# Patient Record
Sex: Male | Born: 1945 | Race: White | Hispanic: No | Marital: Married | Smoking: Never smoker
Health system: Southern US, Community
[De-identification: ages and names within clinical notes are randomized; demographics above are authoritative.]

## PROBLEM LIST (undated history)

## (undated) DIAGNOSIS — K5900 Constipation, unspecified: Secondary | ICD-10-CM

## (undated) DIAGNOSIS — N400 Enlarged prostate without lower urinary tract symptoms: Secondary | ICD-10-CM

## (undated) DIAGNOSIS — G709 Myoneural disorder, unspecified: Secondary | ICD-10-CM

## (undated) DIAGNOSIS — Z9689 Presence of other specified functional implants: Secondary | ICD-10-CM

## (undated) DIAGNOSIS — Z973 Presence of spectacles and contact lenses: Secondary | ICD-10-CM

## (undated) DIAGNOSIS — C801 Malignant (primary) neoplasm, unspecified: Secondary | ICD-10-CM

## (undated) DIAGNOSIS — D649 Anemia, unspecified: Secondary | ICD-10-CM

## (undated) DIAGNOSIS — G20 Parkinson's disease: Secondary | ICD-10-CM

## (undated) DIAGNOSIS — F329 Major depressive disorder, single episode, unspecified: Secondary | ICD-10-CM

## (undated) DIAGNOSIS — F32A Depression, unspecified: Secondary | ICD-10-CM

## (undated) DIAGNOSIS — Z974 Presence of external hearing-aid: Secondary | ICD-10-CM

## (undated) DIAGNOSIS — G4752 REM sleep behavior disorder: Secondary | ICD-10-CM

## (undated) DIAGNOSIS — Z7739 Contact with and (suspected) exposure to other war theater: Secondary | ICD-10-CM

## (undated) DIAGNOSIS — Z87442 Personal history of urinary calculi: Secondary | ICD-10-CM

## (undated) DIAGNOSIS — R5383 Other fatigue: Secondary | ICD-10-CM

## (undated) DIAGNOSIS — K219 Gastro-esophageal reflux disease without esophagitis: Secondary | ICD-10-CM

## (undated) DIAGNOSIS — I951 Orthostatic hypotension: Secondary | ICD-10-CM

## (undated) DIAGNOSIS — N486 Induration penis plastica: Secondary | ICD-10-CM

## (undated) DIAGNOSIS — R569 Unspecified convulsions: Secondary | ICD-10-CM

## (undated) DIAGNOSIS — Z77098 Contact with and (suspected) exposure to other hazardous, chiefly nonmedicinal, chemicals: Secondary | ICD-10-CM

## (undated) DIAGNOSIS — T8859XA Other complications of anesthesia, initial encounter: Secondary | ICD-10-CM

## (undated) DIAGNOSIS — N200 Calculus of kidney: Secondary | ICD-10-CM

## (undated) DIAGNOSIS — J189 Pneumonia, unspecified organism: Secondary | ICD-10-CM

## (undated) DIAGNOSIS — R399 Unspecified symptoms and signs involving the genitourinary system: Secondary | ICD-10-CM

## (undated) DIAGNOSIS — G20A1 Parkinson's disease without dyskinesia, without mention of fluctuations: Secondary | ICD-10-CM

## (undated) DIAGNOSIS — N281 Cyst of kidney, acquired: Secondary | ICD-10-CM

## (undated) DIAGNOSIS — N201 Calculus of ureter: Secondary | ICD-10-CM

## (undated) HISTORY — PX: EXTRACORPOREAL SHOCK WAVE LITHOTRIPSY: SHX1557

## (undated) HISTORY — PX: COLONOSCOPY: SHX174

## (undated) HISTORY — PX: CATARACT EXTRACTION: SUR2

## (undated) HISTORY — PX: TONSILLECTOMY: SUR1361

## (undated) HISTORY — PX: OTHER SURGICAL HISTORY: SHX169

---

## 2004-06-30 ENCOUNTER — Inpatient Hospital Stay (HOSPITAL_COMMUNITY): Admission: EM | Admit: 2004-06-30 | Discharge: 2004-07-01 | Payer: Self-pay | Admitting: Emergency Medicine

## 2004-06-30 ENCOUNTER — Ambulatory Visit: Payer: Self-pay | Admitting: Internal Medicine

## 2010-09-25 ENCOUNTER — Emergency Department (HOSPITAL_COMMUNITY): Payer: Medicare Other

## 2010-09-25 ENCOUNTER — Observation Stay (HOSPITAL_COMMUNITY)
Admission: RE | Admit: 2010-09-25 | Discharge: 2010-09-26 | Disposition: A | Discharge status: Home / Self Care | Payer: Medicare Other | Source: Ambulatory Visit | Attending: Urology | Admitting: Urology

## 2010-09-25 ENCOUNTER — Emergency Department (HOSPITAL_COMMUNITY)
Admission: EM | Admit: 2010-09-25 | Discharge: 2010-09-25 | Disposition: A | Discharge status: Other Acute Inpatient Hospital | Payer: Medicare Other | Source: Home / Self Care | Attending: Emergency Medicine | Admitting: Emergency Medicine

## 2010-09-25 DIAGNOSIS — R10814 Left lower quadrant abdominal tenderness: Secondary | ICD-10-CM | POA: Insufficient documentation

## 2010-09-25 DIAGNOSIS — G20A1 Parkinson's disease without dyskinesia, without mention of fluctuations: Secondary | ICD-10-CM | POA: Insufficient documentation

## 2010-09-25 DIAGNOSIS — R1032 Left lower quadrant pain: Secondary | ICD-10-CM | POA: Insufficient documentation

## 2010-09-25 DIAGNOSIS — R110 Nausea: Secondary | ICD-10-CM | POA: Insufficient documentation

## 2010-09-25 DIAGNOSIS — N200 Calculus of kidney: Secondary | ICD-10-CM | POA: Insufficient documentation

## 2010-09-25 DIAGNOSIS — N210 Calculus in bladder: Secondary | ICD-10-CM | POA: Insufficient documentation

## 2010-09-25 DIAGNOSIS — G20 Parkinson's disease: Secondary | ICD-10-CM | POA: Insufficient documentation

## 2010-09-25 DIAGNOSIS — N201 Calculus of ureter: Secondary | ICD-10-CM | POA: Insufficient documentation

## 2010-09-25 DIAGNOSIS — Z79899 Other long term (current) drug therapy: Secondary | ICD-10-CM | POA: Insufficient documentation

## 2010-09-25 DIAGNOSIS — N400 Enlarged prostate without lower urinary tract symptoms: Secondary | ICD-10-CM | POA: Insufficient documentation

## 2010-09-25 DIAGNOSIS — N1330 Unspecified hydronephrosis: Principal | ICD-10-CM | POA: Insufficient documentation

## 2010-09-25 DIAGNOSIS — K5900 Constipation, unspecified: Secondary | ICD-10-CM | POA: Insufficient documentation

## 2010-09-25 HISTORY — PX: OTHER SURGICAL HISTORY: SHX169

## 2010-09-25 LAB — CBC
HCT: 39.9 % (ref 39.0–52.0)
Hemoglobin: 13.4 g/dL (ref 13.0–17.0)
MCH: 30.1 pg (ref 26.0–34.0)
MCHC: 33.6 g/dL (ref 30.0–36.0)
MCV: 89.7 fL (ref 78.0–100.0)
Platelets: 291 10*3/uL (ref 150–400)
RBC: 4.45 MIL/uL (ref 4.22–5.81)
RDW: 12.1 % (ref 11.5–15.5)
WBC: 6.9 10*3/uL (ref 4.0–10.5)

## 2010-09-25 LAB — URINALYSIS, ROUTINE W REFLEX MICROSCOPIC
Bilirubin Urine: NEGATIVE
Glucose, UA: NEGATIVE mg/dL
Hgb urine dipstick: NEGATIVE
Ketones, ur: NEGATIVE mg/dL
Nitrite: NEGATIVE
Protein, ur: NEGATIVE mg/dL
Specific Gravity, Urine: 1.01 (ref 1.005–1.030)
Urobilinogen, UA: 0.2 mg/dL (ref 0.0–1.0)
pH: 7.5 (ref 5.0–8.0)

## 2010-09-25 LAB — DIFFERENTIAL
Basophils Absolute: 0 10*3/uL (ref 0.0–0.1)
Basophils Relative: 0 % (ref 0–1)
Eosinophils Absolute: 0.1 10*3/uL (ref 0.0–0.7)
Eosinophils Relative: 2 % (ref 0–5)
Lymphocytes Relative: 23 % (ref 12–46)
Lymphs Abs: 1.6 K/uL (ref 0.7–4.0)
Monocytes Absolute: 0.6 10*3/uL (ref 0.1–1.0)
Monocytes Relative: 9 % (ref 3–12)
Neutro Abs: 4.5 K/uL (ref 1.7–7.7)
Neutrophils Relative %: 65 % (ref 43–77)

## 2010-09-25 LAB — BASIC METABOLIC PANEL WITH GFR
BUN: 13 mg/dL (ref 6–23)
CO2: 27 meq/L (ref 19–32)
Calcium: 9.2 mg/dL (ref 8.4–10.5)
Chloride: 104 meq/L (ref 96–112)
GFR calc Af Amer: >60 mL/min (ref >60)
Glucose, Bld: 91 mg/dL (ref 70–99)
Potassium: 4.2 meq/L (ref 3.5–5.1)

## 2010-09-25 LAB — BASIC METABOLIC PANEL
Creatinine, Ser: 0.84 mg/dL (ref 0.50–1.35)
GFR calc non Af Amer: >60 mL/min (ref >60)
Sodium: 137 mEq/L (ref 135–145)

## 2010-09-25 LAB — URINE MICROSCOPIC-ADD ON

## 2010-09-25 MED ORDER — IOHEXOL 300 MG/ML  SOLN
80.0000 mL | Freq: Once | INTRAMUSCULAR | Status: AC | PRN
  Administered 2010-09-25: 80 mL via INTRAVENOUS

## 2010-10-03 NOTE — Op Note (Signed)
NAMEKOURTLAND, COOPMAN                ACCOUNT NO.:  [id number]  MEDICAL RECORD NO.:  [id number]  LOCATION:  1423                         FACILITY:  [facility]  PHYSICIAN:  Danae Chen, M.D.  DATE OF BIRTH:  1945-05-23  DATE OF PROCEDURE:  09/25/2010 DATE OF DISCHARGE:                              OPERATIVE REPORT   PREOPERATIVE DIAGNOSES: 1. Left hydronephrosis. 2. Left uretero-pelvic junction calculus. 3. Left ureteral stone, bladder calculi.  POSTOPERATIVE DIAGNOSES: 1. Left hydronephrosis. 2. Left uretero-pelvic junction calculus. 3. Left ureteral stone, bladder calculi.  PROCEDURE:  Cystoscopy, left retrograde pyelogram, ureteroscopy, insertion of double-J stent, holmium laser bladder stone, stone extraction.  SURGEON:  Danae Chen, M.D.  ANESTHESIA:  General.  INDICATION:  Patient is a 65 year old male, who has been having left- sided abdominal pain for the past 5 to 6 days.  The pain got progressively worse.  He is usually seen at the [place] and had an appointment today, however, the pain was severe enough for him to come to the emergency room.  A CT scan showed a 16-mm stone at the left UPJ, severe left hydronephrosis.  A 3 mm stone at the left distal ureter and a 23 mm stone in the bladder and a 5-mm stone.  He is scheduled for cystoscopy, retrograde pyelogram, insertion of double-J stent, and holmium laser of the bladder calculi.  The patient was identified by his wristband and proper time-out was taken.  Under general anesthesia, he was prepped and draped and placed him under dorsal lithotomy position.  A panendoscope was inserted in the bladder. The anterior urethra was normal.  He has trilobar prostatic hypertrophy. The bladder mucosa is reddened.  There is a large stone in the bladder that measured about 23 mm and there is also a 5 mm smaller stone in the bladder.  The ureteral orifices are in normal position and shape.  Retrograde pyelogram.  A Sensor wire was  passed through a #6-French open- ended catheter and both Sensor wire and open-ended catheter were passed through the cystoscope and through the left ureteral orifice.  The open- ended catheter was advanced in the distal ureter.  The Sensor wire was then removed.  About 10 cc of contrast were then injected through the open-ended catheter.  The distal, mid, and proximal ureter appeared normal.  There was a filling defect at the UPJ consistent with a known UPJ stone.  The renal pelvis and calyces were markedly dilated.  The Sensor wire was then passed through the open-ended catheter and advanced in the renal pelvis and the open-ended catheter was removed.  A semi-rigid ureteroscope was passed in the bladder and through the left ureteral orifice and in the distal ureter.  There was no evidence of stone in the ureter.  The ureteroscope was then removed.  The Sensor wire was back loaded into the cystoscope and a #6-French - 26 double-J stent was passed over the Sensor wire.  The Sensor wire was then removed.  The proximal curl of the double-J stent was in the renal pelvis and the distal curl was in the bladder.  Attention was then directed toward the bladder calculi.  With a 1000 micron-fiber  holmium laser, the large bladder stone was fragmented in several smaller fragments.  Some of the fragments were small enough to be irrigated out of the bladder.  The larger fragments were removed with a nitinol basket through the cystoscope.  It was a very tedious procedure because of the size of the stone and it was a difficult stone to fragment.  All stone fragments were then removed out of the bladder. There were no remaining stone fragments.  The cystoscope was then removed.  A #16-French Foley catheter was then inserted in the bladder.  The patient tolerated the procedure well and left the OR in satisfactory condition to postanesthesia care unit.     Danae Chen, M.D.     MN/MEDQ  D:   09/25/2010  T:  09/26/2010  Job:  [id number]  Electronically Signed by Lindaann Slough M.D. on 10/03/2010 11:27:22 AM

## 2010-10-03 NOTE — Discharge Summary (Signed)
  [name], [name]                ACCOUNT NO.:  [id number]  MEDICAL RECORD NO.:  [id number]  LOCATION:  1423                         FACILITY:  [facility]  PHYSICIAN:  Danae Chen, M.D.  DATE OF BIRTH:  [birth year]  DATE OF ADMISSION:  09/25/2010 DATE OF DISCHARGE:  09/26/2010                              DISCHARGE SUMMARY   DISCHARGE DIAGNOSES:  Left hydronephrosis, left uretero-pelvic junction calculus, bladder calculi.  PROCEDURE DONE:  Cystoscopy, left retrograde pyelogram, ureteroscopy, left double-J stent, holmium laser bladder calculi, and stone extraction on September 25, 2010.  The patient is a 65 year old male who was seen in the emergency room with a 5- to 6-day history of left flank and left lower quadrant pain. The pain got progressively worse and he went to the emergency room on August 23rd.  CT scan showed severe left hydronephrosis with a 16 mm stone at the left UPJ and a 3 mm stone in the left distal ureter and 2 calculi in the bladder, the largest one measures 23 mm.  The patient has a history of Parkinson disease and BPH.  He had cystoscopy, left retrograde pyelogram, ureteroscopy, left double-J stent, EHL, bladder calculi with stone extraction on September 25, 2010.  He was admitted for observation after the procedure.  His postop course was uneventful.  He remained afebrile.  A Foley catheter that was placed in the bladder was draining well.  Urine was clear.  He tolerated his diet well.  He had bowel movements.  His abdomen was soft and nondistended, nontender.  He was then discharged home on August 24th on all his home medication, Percocet, Macrodantin 100 mg twice a day.  He will return to the office next week for Foley catheter removal.  He will be treated at a later date with ESL for the left UPJ calculus.  CONDITION ON DISCHARGE:  Improved.  DISCHARGE DIET:  Regular.  The patient may resume all his prehospital activities.     Danae Chen,  M.D.     MN/MEDQ  D:  09/26/2010  T:  09/27/2010  Job:  [id number]  Electronically Signed by Lindaann Slough M.D. on 10/03/2010 11:19:55 AM

## 2010-10-03 NOTE — H&P (Signed)
NAMEROHITH, FAUTH                ACCOUNT NO.:  [id number]  MEDICAL RECORD NO.:  [id number]  LOCATION:  1423                         FACILITY:  [facility]  PHYSICIAN:  Danae Chen, M.D.  DATE OF BIRTH:  05/15/1945  DATE OF ADMISSION:  09/25/2010 DATE OF DISCHARGE:                             HISTORY & PHYSICAL   CHIEF COMPLAINT:  Left flank pain, left hydronephrosis, left UPJ stone, and bladder calculi.  Patient is 65 year old male who was seen in the emergency room with a 5- day history of left flank and left lower quadrant pain.  The pain has persisted and became more severe today.  The patient is seen at the [place] in [place].  He had an appointment today at the [place], but he came to the emergency room because of the pain.  A CT scan showed severe left hydronephrosis with a 16 mm stone at the UPJ.  He has 2 stones at the left UVJ and a 23 mm stone in the bladder.  He also has some nonobstructing right renal calculi.  I wasthen asked to see the patient for further evaluation and treatment.  PAST MEDICAL HISTORY:  He has a history of Parkinson disease and BPH. He has a history of kidney stone.  SOCIAL HISTORY:  He is married, has 3 children, does not smoke nor drink.  MEDICATIONS: 1. Alfuzosin 10 mg a day, but he ran out of the medication about a     month ago. 2. Pramipexole. 3. Carbidopa and levodopa.  ALLERGIES:  He is allergic to [facility].  FAMILY HISTORY:  His father died of lung cancer.  His mother died of a stroke.  He had one sister who had rheumatoid arthritis and died of a heart attack several years ago.  REVIEW OF SYSTEMS:  As noted in the HPI and everything else is negative.  PHYSICAL EXAMINATION:  GENERAL:  This is a well-developed 65 year old male with Parkinson disease.  He is alert and oriented to time, place, and person. VITAL SIGNS:  His blood pressure is 123/75, pulse 75, respirations 20, and temperature 97.5. SKIN:  Normal.  He does not have any skin  rash. HEENT:  His head is normal.  He has pink conjunctivae.  Ears, nose, and throat are within normal limits. NECK:  Supple.  No cervical adenopathy.  No thyromegaly. CHEST:  Symmetrical. LUNGS:  Fully expanded and clear to percussion and auscultation. HEART:  Regular rhythm. ABDOMEN:  Soft.  He has moderate left flank and left lower quadrant tenderness.  He has no CVA tenderness.  Kidneys are not palpable.  He has no hepatomegaly.  No splenomegaly.  Bladder is not distended.  He has no inguinal hernia. GENITOURINARY:  Penis and scrotal contents are within normal limits. EXTREMITIES:  Within normal limits.  I reviewed the CT scan and he has some small nonobstructing right renal calculi and he has a 16 mm stone at the left UPJ and 2 small left distal ureteral calculi and a 23 mm bladder stone.  Left kidney appears smaller than the right.  His BUN is 13, creatinine 0.84, sodium 137, potassium 4.2.  Urinalysis shows 3 to 6 wbc's.  Hemoglobin is 13.4,  hematocrit 39.9, and WBC 6.9.  IMPRESSION: 1. Left hydronephrosis. 2. Bilateral renal calculi. 3. Left UPJ calculus. 4. Left ureteral calculi. 5. Bladder calculus.  PLAN:  Cystoscopy, holmium laser of bladder stones, left ureteral stone extraction, retrograde pyelogram, and insertion of double-J stent.     Danae Chen, M.D.     MN/MEDQ  D:  09/25/2010  T:  09/26/2010  Job:  [id number]  Electronically Signed by Lindaann Slough M.D. on 10/03/2010 11:24:26 AM

## 2010-10-09 ENCOUNTER — Ambulatory Visit (HOSPITAL_COMMUNITY)
Admission: RE | Admit: 2010-10-09 | Discharge: 2010-10-09 | Disposition: A | Discharge status: Home / Self Care | Payer: Medicare Other | Source: Ambulatory Visit | Attending: Urology | Admitting: Urology

## 2010-10-09 DIAGNOSIS — G20A1 Parkinson's disease without dyskinesia, without mention of fluctuations: Secondary | ICD-10-CM | POA: Insufficient documentation

## 2010-10-09 DIAGNOSIS — N200 Calculus of kidney: Secondary | ICD-10-CM | POA: Insufficient documentation

## 2010-10-09 DIAGNOSIS — Z79899 Other long term (current) drug therapy: Secondary | ICD-10-CM | POA: Insufficient documentation

## 2010-10-09 DIAGNOSIS — N1330 Unspecified hydronephrosis: Secondary | ICD-10-CM | POA: Insufficient documentation

## 2010-10-09 DIAGNOSIS — N201 Calculus of ureter: Secondary | ICD-10-CM | POA: Insufficient documentation

## 2010-10-09 DIAGNOSIS — N400 Enlarged prostate without lower urinary tract symptoms: Secondary | ICD-10-CM | POA: Insufficient documentation

## 2010-10-09 DIAGNOSIS — G20 Parkinson's disease: Secondary | ICD-10-CM | POA: Insufficient documentation

## 2010-10-09 DIAGNOSIS — N210 Calculus in bladder: Secondary | ICD-10-CM | POA: Insufficient documentation

## 2011-01-01 ENCOUNTER — Other Ambulatory Visit: Payer: Self-pay | Admitting: Urology

## 2011-01-02 ENCOUNTER — Encounter (HOSPITAL_COMMUNITY): Payer: Self-pay | Admitting: *Deleted

## 2011-01-02 ENCOUNTER — Encounter (HOSPITAL_COMMUNITY): Payer: Self-pay

## 2011-01-02 NOTE — Progress Notes (Signed)
Instructed no Aspirin or Aspirin products 3 days prior to procedure

## 2011-01-02 NOTE — Progress Notes (Signed)
Pt reminded to take laxative 01/04/11 PM

## 2011-01-05 ENCOUNTER — Ambulatory Visit (HOSPITAL_COMMUNITY)
Admission: RE | Admit: 2011-01-05 | Discharge: 2011-01-05 | Disposition: A | Discharge status: Home / Self Care | Payer: Medicare Other | Source: Ambulatory Visit | Attending: Urology | Admitting: Urology

## 2011-01-05 ENCOUNTER — Encounter (HOSPITAL_COMMUNITY): Payer: Self-pay | Admitting: *Deleted

## 2011-01-05 ENCOUNTER — Encounter (HOSPITAL_COMMUNITY)
Admission: RE | Disposition: A | Discharge status: Home / Self Care | Payer: Self-pay | Source: Ambulatory Visit | Attending: Urology

## 2011-01-05 DIAGNOSIS — N400 Enlarged prostate without lower urinary tract symptoms: Secondary | ICD-10-CM | POA: Insufficient documentation

## 2011-01-05 DIAGNOSIS — G20A1 Parkinson's disease without dyskinesia, without mention of fluctuations: Secondary | ICD-10-CM | POA: Insufficient documentation

## 2011-01-05 DIAGNOSIS — G20 Parkinson's disease: Secondary | ICD-10-CM | POA: Insufficient documentation

## 2011-01-05 DIAGNOSIS — N201 Calculus of ureter: Secondary | ICD-10-CM | POA: Insufficient documentation

## 2011-01-05 HISTORY — DX: Pneumonia, unspecified organism: J18.9

## 2011-01-05 HISTORY — DX: Myoneural disorder, unspecified: G70.9

## 2011-01-05 SURGERY — LITHOTRIPSY, ESWL
Anesthesia: LOCAL | Laterality: Left

## 2011-01-05 MED ORDER — CIPROFLOXACIN HCL 500 MG PO TABS
500.0000 mg | ORAL_TABLET | ORAL | Status: AC
  Administered 2011-01-05: 500 mg via ORAL

## 2011-01-05 MED ORDER — CIPROFLOXACIN HCL 500 MG PO TABS
ORAL_TABLET | ORAL | Status: AC
  Administered 2011-01-05: 500 mg via ORAL
  Filled 2011-01-05: qty 1

## 2011-01-05 MED ORDER — DIAZEPAM 5 MG PO TABS
ORAL_TABLET | ORAL | Status: AC
  Administered 2011-01-05: 10 mg via ORAL
  Filled 2011-01-05: qty 2

## 2011-01-05 MED ORDER — DIPHENHYDRAMINE HCL 25 MG PO CAPS
25.0000 mg | ORAL_CAPSULE | ORAL | Status: AC
  Administered 2011-01-05: 25 mg via ORAL

## 2011-01-05 MED ORDER — DIPHENHYDRAMINE HCL 25 MG PO CAPS
ORAL_CAPSULE | ORAL | Status: AC
  Administered 2011-01-05: 25 mg via ORAL
  Filled 2011-01-05: qty 1

## 2011-01-05 MED ORDER — DEXTROSE-NACL 5-0.45 % IV SOLN
INTRAVENOUS | Status: DC
  Administered 2011-01-05: 15:00:00 via INTRAVENOUS

## 2011-01-05 MED ORDER — DIAZEPAM 5 MG PO TABS
10.0000 mg | ORAL_TABLET | ORAL | Status: AC
  Administered 2011-01-05: 10 mg via ORAL

## 2011-01-05 NOTE — Op Note (Signed)
Refer to [facility] Op Note scanned in the chart 

## 2011-01-05 NOTE — H&P (Signed)
  History and Physical  Chief Complaint: Left renal stone.  History of Present Illness: [name] [name] had ESL left UPJ calculus on 9/6.  He has a 7 mm stone in the proximal ureter.  He needs ESL.  Past Medical History  Diagnosis Date  . Pneumonia     35 yr ago  . Neuromuscular disorder     Parkinsons disease  . Chronic kidney disease     BPH   Past Surgical History  Procedure Date  . Stones removed from bladder 8/12  . Lithotripsy  x2 41yr ago 9/12    Medications:  Prior to Admission:  Prescriptions prior to admission  Medication Sig Dispense Refill  . acetaminophen (TYLENOL) 500 MG tablet Take 1,000 mg by mouth every 6 (six) hours as needed. For pain.       Marland Kitchen alfuzosin (UROXATRAL) 10 MG 24 hr tablet Take 10 mg by mouth every morning.        . carbidopa-levodopa (SINEMET) 25-100 MG per tablet Take 1 tablet by mouth 3 (three) times daily.        . potassium citrate (UROCIT-K) 10 MEQ (1080 MG) SR tablet Take 10 mEq by mouth 3 (three) times daily.        . pramipexole (MIRAPEX) 0.5 MG tablet Take 0.5 mg by mouth 3 (three) times daily.        Marland Kitchen triamcinolone cream (KENALOG) 0.1 % Apply 1 application topically daily as needed. For ezcema.        Allergies:  Allergies  Allergen Reactions  . Flomax (Tamsulosin Hcl) Other (See Comments)    Achy joints    History reviewed. No pertinent family history. Social History:  reports that he has never smoked. He does not have any smokeless tobacco history on file. He reports that he does not use illicit drugs. His alcohol history not on file.  ROS: All systems are reviewed and negative except as noted.   Physical Exam:  Vital signs in last 24 hours: Temp:  [96.5 F (35.8 C)] 96.5 F (35.8 C) (12/03 1458) Pulse Rate:  [85] 85  (12/03 1458) Resp:  [18] 18  (12/03 1458) BP: (118)/(72) 118/72 mmHg (12/03 1458) SpO2:  [96 %] 96 % (12/03 1458) Weight:  [199 lb 4 oz (90.379 kg)] 199 lb 4 oz (90.379 kg) (12/03 1458)  Cardiovascular: Skin  warm; not flushed Respiratory: Breaths quiet; no shortness of breath Abdomen: No masses Neurological: Normal sensation to touch Musculoskeletal: Normal motor function arms and legs Lymphatics: No inguinal adenopathy Skin: No rashes Genitourinary:Penis and scrotal contents are within normal limits.  Laboratory Data:  No results found for this or any previous visit (from the past 24 hour(s)). No results found for this or any previous visit (from the past 240 hour(s)). Creatinine: No results found for this basename: CREATININE:7 in the last 168 hours     Impression/Assessment:  Left proximal ureteral calculus  Plan:  ESL  Allisyn Kunz-HENRY 01/05/2011, 3:50 PM

## 2011-01-06 ENCOUNTER — Encounter (HOSPITAL_COMMUNITY): Payer: Self-pay

## 2011-02-20 DIAGNOSIS — Z8249 Family history of ischemic heart disease and other diseases of the circulatory system: Secondary | ICD-10-CM | POA: Diagnosis not present

## 2011-02-20 DIAGNOSIS — I359 Nonrheumatic aortic valve disorder, unspecified: Secondary | ICD-10-CM | POA: Diagnosis not present

## 2011-02-20 DIAGNOSIS — Z7982 Long term (current) use of aspirin: Secondary | ICD-10-CM | POA: Diagnosis not present

## 2011-02-20 DIAGNOSIS — I059 Rheumatic mitral valve disease, unspecified: Secondary | ICD-10-CM | POA: Diagnosis not present

## 2011-02-20 DIAGNOSIS — Z823 Family history of stroke: Secondary | ICD-10-CM | POA: Diagnosis not present

## 2011-02-20 DIAGNOSIS — Z888 Allergy status to other drugs, medicaments and biological substances status: Secondary | ICD-10-CM | POA: Diagnosis not present

## 2011-02-20 DIAGNOSIS — Z87442 Personal history of urinary calculi: Secondary | ICD-10-CM | POA: Diagnosis not present

## 2011-02-20 DIAGNOSIS — N400 Enlarged prostate without lower urinary tract symptoms: Secondary | ICD-10-CM | POA: Diagnosis not present

## 2011-02-20 DIAGNOSIS — Z79899 Other long term (current) drug therapy: Secondary | ICD-10-CM | POA: Diagnosis not present

## 2011-02-20 DIAGNOSIS — G20 Parkinson's disease: Secondary | ICD-10-CM | POA: Diagnosis not present

## 2011-02-20 DIAGNOSIS — R079 Chest pain, unspecified: Secondary | ICD-10-CM | POA: Diagnosis not present

## 2011-02-20 DIAGNOSIS — R0789 Other chest pain: Secondary | ICD-10-CM | POA: Diagnosis not present

## 2011-05-21 DIAGNOSIS — D1801 Hemangioma of skin and subcutaneous tissue: Secondary | ICD-10-CM | POA: Diagnosis not present

## 2011-05-21 DIAGNOSIS — D485 Neoplasm of uncertain behavior of skin: Secondary | ICD-10-CM | POA: Diagnosis not present

## 2011-05-21 DIAGNOSIS — L821 Other seborrheic keratosis: Secondary | ICD-10-CM | POA: Diagnosis not present

## 2011-06-11 DIAGNOSIS — H2510 Age-related nuclear cataract, unspecified eye: Secondary | ICD-10-CM | POA: Diagnosis not present

## 2011-10-29 DIAGNOSIS — S0100XA Unspecified open wound of scalp, initial encounter: Secondary | ICD-10-CM | POA: Diagnosis not present

## 2011-10-29 DIAGNOSIS — Z23 Encounter for immunization: Secondary | ICD-10-CM | POA: Diagnosis not present

## 2011-10-29 DIAGNOSIS — S0990XA Unspecified injury of head, initial encounter: Secondary | ICD-10-CM | POA: Diagnosis not present

## 2011-11-03 DIAGNOSIS — Z4802 Encounter for removal of sutures: Secondary | ICD-10-CM | POA: Diagnosis not present

## 2012-05-23 DIAGNOSIS — L821 Other seborrheic keratosis: Secondary | ICD-10-CM | POA: Diagnosis not present

## 2012-05-23 DIAGNOSIS — L819 Disorder of pigmentation, unspecified: Secondary | ICD-10-CM | POA: Diagnosis not present

## 2012-05-30 DIAGNOSIS — N401 Enlarged prostate with lower urinary tract symptoms: Secondary | ICD-10-CM | POA: Diagnosis not present

## 2012-05-30 DIAGNOSIS — N390 Urinary tract infection, site not specified: Secondary | ICD-10-CM | POA: Diagnosis not present

## 2012-05-30 DIAGNOSIS — Z87442 Personal history of urinary calculi: Secondary | ICD-10-CM | POA: Diagnosis not present

## 2012-06-15 DIAGNOSIS — H2510 Age-related nuclear cataract, unspecified eye: Secondary | ICD-10-CM | POA: Diagnosis not present

## 2012-06-21 ENCOUNTER — Encounter (HOSPITAL_COMMUNITY): Payer: Self-pay | Admitting: Emergency Medicine

## 2012-06-21 ENCOUNTER — Emergency Department (INDEPENDENT_AMBULATORY_CARE_PROVIDER_SITE_OTHER): Payer: Medicare Other

## 2012-06-21 ENCOUNTER — Emergency Department (INDEPENDENT_AMBULATORY_CARE_PROVIDER_SITE_OTHER)
Admission: EM | Admit: 2012-06-21 | Discharge: 2012-06-21 | Disposition: A | Discharge status: Home / Self Care | Payer: Medicare Other | Source: Home / Self Care | Attending: Family Medicine | Admitting: Family Medicine

## 2012-06-21 ENCOUNTER — Encounter (HOSPITAL_COMMUNITY): Payer: Self-pay | Admitting: *Deleted

## 2012-06-21 ENCOUNTER — Emergency Department (HOSPITAL_COMMUNITY)
Admission: EM | Admit: 2012-06-21 | Discharge: 2012-06-21 | Disposition: A | Discharge status: Home / Self Care | Payer: Medicare Other | Attending: Emergency Medicine | Admitting: Emergency Medicine

## 2012-06-21 DIAGNOSIS — Z79899 Other long term (current) drug therapy: Secondary | ICD-10-CM | POA: Diagnosis not present

## 2012-06-21 DIAGNOSIS — J029 Acute pharyngitis, unspecified: Secondary | ICD-10-CM | POA: Diagnosis not present

## 2012-06-21 DIAGNOSIS — J189 Pneumonia, unspecified organism: Secondary | ICD-10-CM

## 2012-06-21 DIAGNOSIS — R000 Tachycardia, unspecified: Secondary | ICD-10-CM | POA: Insufficient documentation

## 2012-06-21 DIAGNOSIS — R509 Fever, unspecified: Secondary | ICD-10-CM | POA: Diagnosis not present

## 2012-06-21 DIAGNOSIS — G20A1 Parkinson's disease without dyskinesia, without mention of fluctuations: Secondary | ICD-10-CM | POA: Insufficient documentation

## 2012-06-21 DIAGNOSIS — J3489 Other specified disorders of nose and nasal sinuses: Secondary | ICD-10-CM | POA: Diagnosis not present

## 2012-06-21 DIAGNOSIS — N189 Chronic kidney disease, unspecified: Secondary | ICD-10-CM | POA: Diagnosis not present

## 2012-06-21 DIAGNOSIS — J90 Pleural effusion, not elsewhere classified: Secondary | ICD-10-CM | POA: Diagnosis not present

## 2012-06-21 DIAGNOSIS — G20 Parkinson's disease: Secondary | ICD-10-CM | POA: Insufficient documentation

## 2012-06-21 DIAGNOSIS — N400 Enlarged prostate without lower urinary tract symptoms: Secondary | ICD-10-CM | POA: Diagnosis not present

## 2012-06-21 DIAGNOSIS — IMO0001 Reserved for inherently not codable concepts without codable children: Secondary | ICD-10-CM | POA: Insufficient documentation

## 2012-06-21 LAB — COMPREHENSIVE METABOLIC PANEL
CO2: 25 mEq/L (ref 19–32)
Calcium: 8.7 mg/dL (ref 8.4–10.5)
Creatinine, Ser: 0.9 mg/dL (ref 0.50–1.35)
GFR calc Af Amer: >90 mL/min (ref >90)
GFR calc non Af Amer: 85 mL/min — ABNORMAL LOW (ref >90)
Glucose, Bld: 88 mg/dL (ref 70–99)
Total Protein: 6.7 g/dL (ref 6.0–8.3)

## 2012-06-21 LAB — CBC
Hemoglobin: 14.4 g/dL (ref 13.0–17.0)
MCH: 31.2 pg (ref 26.0–34.0)
MCHC: 34.2 g/dL (ref 30.0–36.0)
MCV: 91.1 fL (ref 78.0–100.0)
RBC: 4.62 MIL/uL (ref 4.22–5.81)

## 2012-06-21 LAB — POCT URINALYSIS DIP (DEVICE)
Bilirubin Urine: NEGATIVE
Ketones, ur: NEGATIVE mg/dL
Nitrite: NEGATIVE
Protein, ur: 30 mg/dL — AB
pH: 8 (ref 5.0–8.0)

## 2012-06-21 MED ORDER — AZITHROMYCIN 250 MG PO TABS
500.0000 mg | ORAL_TABLET | Freq: Once | ORAL | Status: AC
  Administered 2012-06-21: 500 mg via ORAL
  Filled 2012-06-21: qty 2

## 2012-06-21 MED ORDER — AZITHROMYCIN 250 MG PO TABS: 250.0000 mg | ORAL_TABLET | Freq: Every day | ORAL | Status: DC

## 2012-06-21 MED ORDER — SODIUM CHLORIDE 0.9 % IV BOLUS (SEPSIS)
1000.0000 mL | Freq: Once | INTRAVENOUS | Status: AC
  Administered 2012-06-21: 1000 mL via INTRAVENOUS

## 2012-06-21 MED ORDER — DEXTROSE 5 % IV SOLN
1.0000 g | Freq: Once | INTRAVENOUS | Status: AC
  Administered 2012-06-21: 1 g via INTRAVENOUS
  Filled 2012-06-21: qty 10

## 2012-06-21 NOTE — ED Notes (Signed)
Pt reports generalized weakness, cough, and fever for past few days. Went to Urgent Care and chest xray showed pneumonia and was told to come to the ER for further treatment.

## 2012-06-21 NOTE — ED Notes (Signed)
Pt in from urgent care, went there due to cough, sore throat, generalized weakness, pt sent here for further evaluation due to chest xray indicating possible pneumonia

## 2012-06-21 NOTE — ED Provider Notes (Signed)
Medical screening examination/treatment/procedure(s) were performed by non-physician practitioner and as supervising physician I was immediately available for consultation/collaboration.  Ares Tegtmeyer T Luke Falero, MD 06/21/12 2307 

## 2012-06-21 NOTE — ED Notes (Signed)
Pt c/o fever with chills since Sunday. Also has cough and generalized weakness. Throat is sore and red. Took Alka seltzer cold and sore throat with no relief. Thought it was seasonal allergies but symptoms have gotten worse. Pt is alert and oriented.

## 2012-06-21 NOTE — ED Provider Notes (Signed)
History     CSN: [id number]  Arrival date & time 06/21/12  1731   First MD Initiated Contact with Patient 06/21/12 1953      Chief Complaint  Patient presents with  . Cough    (Consider location/radiation/quality/duration/timing/severity/associated sxs/prior treatment) HPI Comments: Sunday started to feel bad with myalgias, cough, temperature, decreased appetite has not taken any meds PTA but does report that his urologist gave him 7 days of Levaquin about 3 weeks ago for a presumed UTI  Patient is a 67 y.o. male presenting with cough. The history is provided by the patient.  Cough Cough characteristics:  Productive Sputum characteristics:  Yellow Severity:  Moderate Onset quality:  Gradual Duration:  2 days Timing:  Intermittent Progression:  Worsening Chronicity:  New Relieved by:  Nothing Worsened by:  Nothing tried Associated symptoms: chills, fever, myalgias, sinus congestion and sore throat   Associated symptoms: no rash, no shortness of breath and no wheezing   Fever:    Duration:  2 days   Timing:  Intermittent   Temp source:  Tactile Myalgias:    Location:  Generalized   Severity:  Mild   Onset quality:  Gradual   Timing:  Intermittent   Past Medical History  Diagnosis Date  . Pneumonia     35  yr ago  . Neuromuscular disorder     Parkinsons disease  . Chronic kidney disease     BPH    Past Surgical History  Procedure Laterality Date  . 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 Lithotripsy  x2  63yr ago 9/12    History reviewed. No pertinent family history.  History  Substance Use Topics  . Smoking status: Never Smoker   . Smokeless tobacco: Not on file  . Alcohol Use: Yes     Comment: rarely beer      Review of Systems  Constitutional: Positive for fever and chills.  HENT: Positive for congestion and sore throat.   Respiratory: Positive for cough. Negative for shortness of breath, wheezing and stridor.   Gastrointestinal: Negative for  vomiting.  Musculoskeletal: Positive for myalgias.  Skin: Negative for rash.  All other systems reviewed and are negative.    Allergies  Flomax  Home Medications   Current Outpatient Rx  Name  Route  Sig  Dispense  Refill  . acetaminophen (TYLENOL) 500 MG tablet   Oral   Take 1,000 mg by mouth every 6 (six) hours as needed. For pain.          Marland Kitchen alfuzosin (UROXATRAL) 10 MG 24 hr tablet   Oral   Take 10 mg by mouth at bedtime.          . ALPRAZolam (XANAX) 0.25 MG tablet   Oral   Take 0.25 mg by mouth at bedtime as needed for sleep.         Marland Kitchen aspirin-sod bicarb-citric acid (ALKA-SELTZER) 325 MG TBEF   Oral   Take 325 mg by mouth every 6 (six) hours as needed (cold symptoms).         . carbidopa-levodopa (SINEMET) 25-100 MG per tablet   Oral   Take 2 tablets by mouth 5 (five) times daily.          Marland Kitchen ibuprofen (ADVIL,MOTRIN) 200 MG tablet   Oral   Take 200 mg by mouth every 6 (six) hours as needed for pain.         . rotigotine (NEUPRO) 2 MG/24HR   Transdermal   Place  1 patch onto the skin daily.         . sertraline (ZOLOFT) 25 MG tablet   Oral   Take 25 mg by mouth daily.         Marland Kitchen triamcinolone cream (KENALOG) 0.1 %   Topical   Apply 1 application topically daily as needed. For ezcema.            BP 114/57  Pulse 72  Temp(Src) 100.7 F (38.2 C) (Rectal)  Resp 16  SpO2 94%  Physical Exam  Constitutional: He is oriented to person, place, and time. He appears well-developed.  HENT:  Head: Normocephalic.  Neck: Normal range of motion.  Cardiovascular: Regular rhythm.  Tachycardia present.   Pulmonary/Chest: Effort normal and breath sounds normal. No respiratory distress. He has no wheezes. He exhibits no tenderness.  Musculoskeletal: Normal range of motion. He exhibits no edema and no tenderness.  Neurological: He is alert and oriented to person, place, and time.  Patient has a parkinsonian tremor  Skin: Skin is warm and dry. No rash  noted.    ED Course  Procedures (including critical care time)  Labs Reviewed  CBC - Abnormal; Notable for the following:    WBC 12.0 (*)    All other components within normal limits  COMPREHENSIVE METABOLIC PANEL - Abnormal; Notable for the following:    Albumin 3.3 (*)    GFR calc non Af Amer 85 (*)    All other components within normal limits   Dg Chest 2 View  06/21/2012   *RADIOLOGY REPORT*  Clinical Data: Fever  CHEST - 2 VIEW  Comparison: 06/30/2004  Findings: Heart size is normal and the vascularity is normal.  Small patchy airspace disease in the left lung base with a small left effusion.  This could represent pneumonia or atelectasis. Right lung is clear.  IMPRESSION: Mild left lower lobe airspace disease and left effusion, possible pneumonia.   Original Report Authenticated By: Janeece Riggers, M.D.     1. CAP (community acquired pneumonia)       MDM   After IV fluids, 1 g of IV Rocephin.  Patient is feeling much better.  He is no longer tachycardic.  His blood pressure has normalized to 114 and he is feeling much better.  We'll ambulate the patient and reassess States he still feels slightly weak, but he does not feel short of breath, or dizzy.  When he ambulates.  He is feeling much better than on initial presentation        Arman Filter, NP 06/21/12 2307

## 2012-06-21 NOTE — ED Provider Notes (Signed)
History     CSN: [id number]  Arrival date & time 06/21/12  1532   First MD Initiated Contact with Patient 06/21/12 1558      Chief Complaint  Patient presents with  . URI    (Consider location/radiation/quality/duration/timing/severity/associated sxs/prior treatment) Patient is a 67 y.o. male presenting with fever. The history is provided by the patient.  Fever Severity:  Moderate Onset quality:  Gradual Duration:  2 days Timing:  Intermittent Progression:  Worsening Chronicity:  New Associated symptoms: chills, congestion, cough, nausea, rhinorrhea and sore throat   Associated symptoms: no diarrhea, no dysuria and no vomiting     Past Medical History  Diagnosis Date  . Pneumonia     35 yr ago  . Neuromuscular disorder     Parkinsons disease  . Chronic kidney disease     BPH    Past Surgical History  Procedure Laterality Date  . 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 Lithotripsy  x2  39yr ago 9/12    No family history on file.  History  Substance Use Topics  . Smoking status: Never Smoker   . Smokeless tobacco: Not on file  . Alcohol Use: Yes     Comment: rarely beer      Review of Systems  Constitutional: Positive for fever and chills.  HENT: Positive for congestion, sore throat, rhinorrhea, sneezing and postnasal drip.   Respiratory: Positive for cough. Negative for shortness of breath and wheezing.   Gastrointestinal: Positive for nausea and abdominal pain. Negative for vomiting and diarrhea.  Genitourinary: Negative for dysuria.  Musculoskeletal: Negative.   Skin: Negative.     Allergies  Flomax  Home Medications   Current Outpatient Rx  Name  Route  Sig  Dispense  Refill  . ALPRAZolam (XANAX) 0.25 MG tablet   Oral   Take 0.25 mg by mouth at bedtime as needed for sleep.         . carbidopa-levodopa (SINEMET) 25-100 MG per tablet   Oral   Take 1 tablet by mouth 3 (three) times daily.           . rotigotine (NEUPRO) 2 MG/24HR  Transdermal   Place 1 patch onto the skin daily.         . sertraline (ZOLOFT) 25 MG tablet   Oral   Take 25 mg by mouth daily.         Marland Kitchen acetaminophen (TYLENOL) 500 MG tablet   Oral   Take 1,000 mg by mouth every 6 (six) hours as needed. For pain.          Marland Kitchen alfuzosin (UROXATRAL) 10 MG 24 hr tablet   Oral   Take 10 mg by mouth every morning.           . potassium citrate (UROCIT-K) 10 MEQ (1080 MG) SR tablet   Oral   Take 10 mEq by mouth 3 (three) times daily.           . pramipexole (MIRAPEX) 0.5 MG tablet   Oral   Take 0.5 mg by mouth 3 (three) times daily.           Marland Kitchen triamcinolone cream (KENALOG) 0.1 %   Topical   Apply 1 application topically daily as needed. For ezcema.            BP 93/46  Pulse 113  Temp(Src) 100.4 F (38 C) (Oral)  Resp 18  SpO2 94%  Physical Exam  Nursing note and vitals reviewed. Constitutional:  He is oriented to person, place, and time. He appears well-developed and well-nourished. He appears distressed.  HENT:  Head: Normocephalic.  Right Ear: External ear normal.  Left Ear: External ear normal.  Nose: Mucosal edema and rhinorrhea present.  Mouth/Throat: Oropharynx is clear and moist.  Eyes: Pupils are equal, round, and reactive to light.  Neck: Normal range of motion. Neck supple.  Cardiovascular: Normal rate, regular rhythm, normal heart sounds and intact distal pulses.   Pulmonary/Chest: Effort normal and breath sounds normal.  Abdominal: Soft. Bowel sounds are normal. He exhibits no distension and no mass. There is tenderness in the right upper quadrant. There is no rebound, no guarding and no CVA tenderness.  Neurological: He is alert and oriented to person, place, and time. No cranial nerve deficit.  Resting tremor  Skin: Skin is warm and dry.    ED Course  Procedures (including critical care time)  Labs Reviewed  POCT URINALYSIS DIP (DEVICE) - Abnormal; Notable for the following:    Glucose, UA 100 (*)     Hgb urine dipstick TRACE (*)    Protein, ur 30 (*)    Leukocytes, UA SMALL (*)    All other components within normal limits  POCT RAPID STREP A ([facility] CARE ONLY)   Dg Chest 2 View  06/21/2012   *RADIOLOGY REPORT*  Clinical Data: Fever  CHEST - 2 VIEW  Comparison: 06/30/2004  Findings: Heart size is normal and the vascularity is normal.  Small patchy airspace disease in the left lung base with a small left effusion.  This could represent pneumonia or atelectasis. Right lung is clear.  IMPRESSION: Mild left lower lobe airspace disease and left effusion, possible pneumonia.   Original Report Authenticated By: Janeece Riggers, M.D.     1. CAP (community acquired pneumonia)       MDM  67 yo with parkinson's with fever , secondary to cap on xray.        Linna Hoff, MD 06/21/12 [phone]

## 2012-06-21 NOTE — ED Notes (Signed)
Pt ambulated in the hall with 1 assist with no problem. Pt denied any dizziness, still slight "weakness" but feels "better than when he came in."

## 2012-11-29 HISTORY — PX: DEEP BRAIN STIMULATOR PLACEMENT: SHX608

## 2012-12-05 DIAGNOSIS — R339 Retention of urine, unspecified: Secondary | ICD-10-CM | POA: Diagnosis not present

## 2012-12-12 DIAGNOSIS — N200 Calculus of kidney: Secondary | ICD-10-CM | POA: Diagnosis not present

## 2012-12-12 DIAGNOSIS — N201 Calculus of ureter: Secondary | ICD-10-CM | POA: Diagnosis not present

## 2012-12-15 ENCOUNTER — Inpatient Hospital Stay (HOSPITAL_COMMUNITY)
Admission: EM | Admit: 2012-12-15 | Discharge: 2012-12-19 | DRG: 854 | Disposition: A | Discharge status: Home Health | Payer: Medicare Other | Attending: Internal Medicine | Admitting: Internal Medicine

## 2012-12-15 ENCOUNTER — Inpatient Hospital Stay (HOSPITAL_COMMUNITY): Payer: Medicare Other

## 2012-12-15 ENCOUNTER — Encounter (HOSPITAL_COMMUNITY): Payer: Self-pay | Admitting: Emergency Medicine

## 2012-12-15 ENCOUNTER — Emergency Department (HOSPITAL_COMMUNITY): Payer: Medicare Other

## 2012-12-15 DIAGNOSIS — E869 Volume depletion, unspecified: Secondary | ICD-10-CM | POA: Diagnosis present

## 2012-12-15 DIAGNOSIS — A419 Sepsis, unspecified organism: Secondary | ICD-10-CM | POA: Diagnosis not present

## 2012-12-15 DIAGNOSIS — Z79899 Other long term (current) drug therapy: Secondary | ICD-10-CM | POA: Diagnosis not present

## 2012-12-15 DIAGNOSIS — R509 Fever, unspecified: Secondary | ICD-10-CM

## 2012-12-15 DIAGNOSIS — K5900 Constipation, unspecified: Secondary | ICD-10-CM | POA: Diagnosis present

## 2012-12-15 DIAGNOSIS — Z8701 Personal history of pneumonia (recurrent): Secondary | ICD-10-CM

## 2012-12-15 DIAGNOSIS — N400 Enlarged prostate without lower urinary tract symptoms: Secondary | ICD-10-CM | POA: Diagnosis present

## 2012-12-15 DIAGNOSIS — N10 Acute tubulo-interstitial nephritis: Secondary | ICD-10-CM

## 2012-12-15 DIAGNOSIS — N201 Calculus of ureter: Secondary | ICD-10-CM | POA: Diagnosis present

## 2012-12-15 DIAGNOSIS — Z87442 Personal history of urinary calculi: Secondary | ICD-10-CM

## 2012-12-15 DIAGNOSIS — E871 Hypo-osmolality and hyponatremia: Secondary | ICD-10-CM | POA: Diagnosis present

## 2012-12-15 DIAGNOSIS — N210 Calculus in bladder: Secondary | ICD-10-CM | POA: Diagnosis not present

## 2012-12-15 DIAGNOSIS — J9819 Other pulmonary collapse: Secondary | ICD-10-CM | POA: Diagnosis not present

## 2012-12-15 DIAGNOSIS — N1330 Unspecified hydronephrosis: Secondary | ICD-10-CM | POA: Diagnosis not present

## 2012-12-15 DIAGNOSIS — G20 Parkinson's disease: Secondary | ICD-10-CM | POA: Diagnosis not present

## 2012-12-15 DIAGNOSIS — N390 Urinary tract infection, site not specified: Secondary | ICD-10-CM | POA: Diagnosis not present

## 2012-12-15 DIAGNOSIS — G20A1 Parkinson's disease without dyskinesia, without mention of fluctuations: Secondary | ICD-10-CM | POA: Diagnosis not present

## 2012-12-15 DIAGNOSIS — R109 Unspecified abdominal pain: Secondary | ICD-10-CM | POA: Diagnosis not present

## 2012-12-15 DIAGNOSIS — K8020 Calculus of gallbladder without cholecystitis without obstruction: Secondary | ICD-10-CM | POA: Diagnosis not present

## 2012-12-15 DIAGNOSIS — N200 Calculus of kidney: Secondary | ICD-10-CM | POA: Diagnosis not present

## 2012-12-15 DIAGNOSIS — R5381 Other malaise: Secondary | ICD-10-CM | POA: Diagnosis not present

## 2012-12-15 LAB — CBC WITH DIFFERENTIAL/PLATELET
Basophils Absolute: 0 10*3/uL (ref 0.0–0.1)
Basophils Relative: 0 % (ref 0–1)
Eosinophils Relative: 0 % (ref 0–5)
Lymphocytes Relative: 4 % — ABNORMAL LOW (ref 12–46)
MCH: 29.8 pg (ref 26.0–34.0)
Monocytes Absolute: 1.5 10*3/uL — ABNORMAL HIGH (ref 0.1–1.0)
Monocytes Relative: 8 % (ref 3–12)
Neutrophils Relative %: 88 % — ABNORMAL HIGH (ref 43–77)
Platelets: 456 10*3/uL — ABNORMAL HIGH (ref 150–400)
RBC: 4.46 MIL/uL (ref 4.22–5.81)
WBC: 18.9 10*3/uL — ABNORMAL HIGH (ref 4.0–10.5)

## 2012-12-15 LAB — URINALYSIS, ROUTINE W REFLEX MICROSCOPIC
Nitrite: NEGATIVE
Specific Gravity, Urine: 1.022 (ref 1.005–1.030)
Urobilinogen, UA: 0.2 mg/dL (ref 0.0–1.0)

## 2012-12-15 LAB — HEPATIC FUNCTION PANEL
ALT: <5 U/L (ref 0–53)
AST: 8 U/L (ref 0–37)
Alkaline Phosphatase: 70 U/L (ref 39–117)
Bilirubin, Direct: <0.1 mg/dL (ref 0.0–0.3)
Total Bilirubin: 0.5 mg/dL (ref 0.3–1.2)

## 2012-12-15 LAB — BASIC METABOLIC PANEL
BUN: 16 mg/dL (ref 6–23)
CO2: 27 mEq/L (ref 19–32)
Calcium: 9.3 mg/dL (ref 8.4–10.5)
Chloride: 99 mEq/L (ref 96–112)
Creatinine, Ser: 0.86 mg/dL (ref 0.50–1.35)

## 2012-12-15 LAB — URINE MICROSCOPIC-ADD ON

## 2012-12-15 MED ORDER — SODIUM CHLORIDE 0.9 % IV BOLUS (SEPSIS)
1000.0000 mL | Freq: Once | INTRAVENOUS | Status: AC
  Administered 2012-12-15: 1000 mL via INTRAVENOUS

## 2012-12-15 MED ORDER — HYDROCODONE-ACETAMINOPHEN 5-325 MG PO TABS
1.0000 | ORAL_TABLET | ORAL | Status: DC | PRN
  Administered 2012-12-16: 2 via ORAL
  Filled 2012-12-15: qty 2

## 2012-12-15 MED ORDER — ACETAMINOPHEN 650 MG RE SUPP
650.0000 mg | Freq: Four times a day (QID) | RECTAL | Status: DC | PRN
  Administered 2012-12-15: 650 mg via RECTAL
  Filled 2012-12-15: qty 1

## 2012-12-15 MED ORDER — SODIUM CHLORIDE 0.9 % IV SOLN
1250.0000 mg | Freq: Two times a day (BID) | INTRAVENOUS | Status: DC
  Administered 2012-12-15 – 2012-12-18 (×6): 1250 mg via INTRAVENOUS
  Filled 2012-12-15 (×7): qty 1250

## 2012-12-15 MED ORDER — PIPERACILLIN-TAZOBACTAM 3.375 G IVPB
3.3750 g | Freq: Three times a day (TID) | INTRAVENOUS | Status: DC
  Administered 2012-12-15 – 2012-12-19 (×12): 3.375 g via INTRAVENOUS
  Filled 2012-12-15 (×12): qty 50

## 2012-12-15 MED ORDER — CLONAZEPAM 1 MG PO TABS
1.0000 mg | ORAL_TABLET | Freq: Every day | ORAL | Status: DC
  Administered 2012-12-15 – 2012-12-18 (×4): 1 mg via ORAL
  Filled 2012-12-15 (×4): qty 1

## 2012-12-15 MED ORDER — CLONAZEPAM 1 MG PO TABS
1.0000 mg | ORAL_TABLET | Freq: Every day | ORAL | Status: DC
  Filled 2012-12-15: qty 1

## 2012-12-15 MED ORDER — ALFUZOSIN HCL ER 10 MG PO TB24
10.0000 mg | ORAL_TABLET | Freq: Every day | ORAL | Status: DC
  Administered 2012-12-15 – 2012-12-18 (×4): 10 mg via ORAL
  Filled 2012-12-15 (×5): qty 1

## 2012-12-15 MED ORDER — IOHEXOL 300 MG/ML  SOLN: 50.0000 mL | Freq: Once | INTRAMUSCULAR | Status: AC | PRN

## 2012-12-15 MED ORDER — SERTRALINE HCL 25 MG PO TABS
25.0000 mg | ORAL_TABLET | Freq: Every day | ORAL | Status: DC
  Administered 2012-12-15 – 2012-12-19 (×5): 25 mg via ORAL
  Filled 2012-12-15 (×5): qty 1

## 2012-12-15 MED ORDER — PIPERACILLIN-TAZOBACTAM 3.375 G IVPB 30 MIN: 3.3750 g | Freq: Three times a day (TID) | INTRAVENOUS | Status: DC

## 2012-12-15 MED ORDER — ALPRAZOLAM 0.25 MG PO TABS: 0.2500 mg | ORAL_TABLET | Freq: Every evening | ORAL | Status: DC | PRN

## 2012-12-15 MED ORDER — ENOXAPARIN SODIUM 40 MG/0.4ML ~~LOC~~ SOLN
40.0000 mg | SUBCUTANEOUS | Status: DC
  Administered 2012-12-15 – 2012-12-18 (×4): 40 mg via SUBCUTANEOUS
  Filled 2012-12-15 (×5): qty 0.4

## 2012-12-15 MED ORDER — CARBIDOPA-LEVODOPA 25-100 MG PO TABS
2.0000 | ORAL_TABLET | Freq: Every day | ORAL | Status: DC
  Administered 2012-12-15 – 2012-12-19 (×19): 2 via ORAL
  Filled 2012-12-15 (×26): qty 2

## 2012-12-15 MED ORDER — ACETAMINOPHEN 325 MG PO TABS
650.0000 mg | ORAL_TABLET | Freq: Four times a day (QID) | ORAL | Status: DC | PRN
  Administered 2012-12-16 (×2): 650 mg via ORAL
  Filled 2012-12-15 (×3): qty 2

## 2012-12-15 MED ORDER — SODIUM CHLORIDE 0.9 % IV SOLN
INTRAVENOUS | Status: DC
  Administered 2012-12-15 – 2012-12-17 (×4): via INTRAVENOUS

## 2012-12-15 MED ORDER — ONDANSETRON HCL 4 MG/2ML IJ SOLN
4.0000 mg | Freq: Four times a day (QID) | INTRAMUSCULAR | Status: DC | PRN
  Administered 2012-12-15: 4 mg via INTRAVENOUS
  Filled 2012-12-15: qty 2

## 2012-12-15 MED ORDER — SODIUM CHLORIDE 0.9 % IJ SOLN: 3.0000 mL | Freq: Two times a day (BID) | INTRAMUSCULAR | Status: DC

## 2012-12-15 MED ORDER — ACETAMINOPHEN 325 MG PO TABS
650.0000 mg | ORAL_TABLET | Freq: Once | ORAL | Status: AC
  Administered 2012-12-15: 650 mg via ORAL
  Filled 2012-12-15: qty 2

## 2012-12-15 MED ORDER — DEXTROSE 5 % IV SOLN
1.0000 g | Freq: Once | INTRAVENOUS | Status: AC
  Administered 2012-12-15: 1 g via INTRAVENOUS
  Filled 2012-12-15: qty 10

## 2012-12-15 MED ORDER — IOHEXOL 300 MG/ML  SOLN
100.0000 mL | Freq: Once | INTRAMUSCULAR | Status: AC | PRN
  Administered 2012-12-15: 100 mL via INTRAVENOUS

## 2012-12-15 MED ORDER — ROTIGOTINE 2 MG/24HR TD PT24
1.0000 | MEDICATED_PATCH | Freq: Every day | TRANSDERMAL | Status: DC
  Administered 2012-12-15 – 2012-12-18 (×4): 1 via TRANSDERMAL

## 2012-12-15 NOTE — Progress Notes (Signed)
Utilization Review completed.  Elly Haffey RN CM  

## 2012-12-15 NOTE — ED Provider Notes (Signed)
CSN: [id number]     Arrival date & time 12/15/12  1308 History   First MD Initiated Contact with Patient 12/15/12 1012     Chief Complaint  Patient presents with  . Fever  . Fatigue   (Consider location/radiation/quality/duration/timing/severity/associated sxs/prior Treatment) HPI  This is a 67 year old male with a history of Parkinson's disease status post deep brain stimulator placement 20 days ago who presents with fever. Per the patient's wife, the patient developed urinary retention and constipation following his surgery. He had a Foley catheter placed approximately 10 days ago for urinary retention.  He was doing well until this Monday when he had abdominal pain. At that time he had a x-ray and ultrasound done by his urologist that was concerning for kidney stones. He has required lithotripsy in the past for this. The patient reports today with 101.8 fever this morning at 8 AM. He denies any headache or neck tightness. He still has a Foley catheter in place. He denies any abdominal pain, chest pain, shortness of breath, or cough.  Past Medical History  Diagnosis Date  . Pneumonia     35 yr ago  . Neuromuscular disorder     Parkinsons disease  . Chronic kidney disease     BPH   Past Surgical History  Procedure Laterality Date  . 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 Lithotripsy  x2  54yr ago 9/12   History reviewed. No pertinent family history. History  Substance Use Topics  . Smoking status: Never Smoker   . Smokeless tobacco: Never Used  . Alcohol Use: Yes     Comment: rarely beer    Review of Systems  Constitutional: Positive for fever.  Respiratory: Negative.  Negative for chest tightness and shortness of breath.   Cardiovascular: Negative.  Negative for chest pain.  Gastrointestinal: Negative.  Negative for nausea, vomiting and abdominal pain.  Genitourinary: Negative.   Musculoskeletal: Negative for back pain and neck stiffness.  Skin: Negative for rash.   Neurological: Negative for headaches.  All other systems reviewed and are negative.    Allergies  Flomax  Home Medications   No current outpatient prescriptions on file. BP 129/63  Pulse 89  Temp(Src) 98.1 F (36.7 C) (Oral)  Resp 18  Ht 5\' 10"  (1.778 m)  Wt 190 lb 14.7 oz (86.6 kg)  BMI 27.39 kg/m2  SpO2 93% Physical Exam  Nursing note and vitals reviewed. Constitutional: He is oriented to person, place, and time.  Elderly, no acute distress, resting tremor noted  HENT:  Head: Normocephalic.  Incisions over the left frontal scalp and at the vertex clean dry and intact with staples in place, no evidence of erythema or induration  Eyes: EOM are normal. Pupils are equal, round, and reactive to light.  Neck: Neck supple.  Cardiovascular: Normal rate, regular rhythm and normal heart sounds.   No murmur heard. Pulmonary/Chest: Effort normal and breath sounds normal. No respiratory distress.  Abdominal: Soft. Bowel sounds are normal. There is no tenderness. There is no rebound.  Genitourinary:  Foley catheter in place  Musculoskeletal: He exhibits no edema.  Lymphadenopathy:    He has no cervical adenopathy.  Neurological: He is alert and oriented to person, place, and time.  Resting tremor, moves all 4 extremities  Skin: Skin is warm and dry.  Psychiatric: He has a normal mood and affect.    ED Course  Procedures (including critical care time) Labs Review Labs Reviewed  URINALYSIS, ROUTINE W REFLEX MICROSCOPIC -  Abnormal; Notable for the following:    APPearance CLOUDY (*)    Hgb urine dipstick LARGE (*)    Ketones, ur 40 (*)    Protein, ur 30 (*)    Leukocytes, UA LARGE (*)    All other components within normal limits  CBC WITH DIFFERENTIAL - Abnormal; Notable for the following:    WBC 18.9 (*)    Platelets 456 (*)    Neutrophils Relative % 88 (*)    Neutro Abs 16.6 (*)    Lymphocytes Relative 4 (*)    Monocytes Absolute 1.5 (*)    All other components  within normal limits  BASIC METABOLIC PANEL - Abnormal; Notable for the following:    Sodium 134 (*)    Glucose, Bld 118 (*)    GFR calc non Af Amer 87 (*)    All other components within normal limits  URINE MICROSCOPIC-ADD ON - Abnormal; Notable for the following:    Bacteria, UA FEW (*)    All other components within normal limits  URINE CULTURE  CULTURE, BLOOD (ROUTINE X 2)  CULTURE, BLOOD (ROUTINE X 2)   Imaging Review Ct Head Wo Contrast  12/15/2012   CLINICAL DATA:  Fever, fatigue.  EXAM: CT HEAD WITHOUT CONTRAST  TECHNIQUE: Contiguous axial images were obtained from the base of the skull through the vertex without intravenous contrast.  COMPARISON:  None.  FINDINGS: Stimulator lead is seen entering the left frontal skull with tip in the brain parenchyma chest inferior to the left thalamus and above the cerebral peduncle. No mass effect or midline shift is noted. Ventricular size is within normal limits. There is no evidence of mass lesion, hemorrhage or acute infarction. Bony calvarium is intact except for previously described postoperative changes.  IMPRESSION: Stimulator lead seen entering left frontal skull with lead in left-sided brain parenchyma as described above. No acute intracranial abnormality seen.   Electronically Signed   By: Roque Lias M.D.   On: 12/15/2012 13:56    EKG Interpretation     Ventricular Rate:    PR Interval:    QRS Duration:   QT Interval:    QTC Calculation:   R Axis:     Text Interpretation:             Medications  acetaminophen (TYLENOL) tablet 650 mg (650 mg Oral Given 12/15/12 1157)  cefTRIAXone (ROCEPHIN) 1 g in dextrose 5 % 50 mL IVPB (1 g Intravenous New Bag/Given 12/15/12 1435)  sodium chloride 0.9 % bolus 1,000 mL (1,000 mLs Intravenous New Bag/Given 12/15/12 1336)    MDM   1. Urinary tract infection   2. Fever   3. Parkinson's disease   4. Sepsis    Patient presents with fever and indwelling Foley catheter. He is  nontoxic-appearing.  Rectal temp was 104. Foley catheter was changed. Lab notable for leukocytosis. Urinalysis shows evidence of urinary tract infection. Patient's presentation is consistent with sepsis given leukocytosis, fever, and known source. Patient will be admitted for further management by the hospitalist. CT scan is negative for acute abnormality.    Shon Baton, MD 12/15/12 [phone]

## 2012-12-15 NOTE — Progress Notes (Signed)
ANTIBIOTIC CONSULT NOTE - INITIAL  Pharmacy Consult for vancomycin Indication: rule out sepsis  Allergies  Allergen Reactions  . Flomax [Tamsulosin Hcl] Other (See Comments)    Achy joints    Patient Measurements: Height: 5\' 10"  (177.8 cm) Weight: 190 lb 14.7 oz (86.6 kg) IBW/kg (Calculated) : 73  Vital Signs: Temp: 98.1 F (36.7 C) (11/13 1558) Temp src: Oral (11/13 1558) BP: 129/63 mmHg (11/13 1558) Pulse Rate: 89 (11/13 1558) Intake/Output from previous day:   Intake/Output from this shift: Total I/O In: -  Out: 300 [Urine:300]  Labs:  Recent Labs  12/15/12 1040  WBC 18.9*  HGB 13.3  PLT 456*  CREATININE 0.86   Estimated Creatinine Clearance: 84.9 ml/min (by C-G formula based on Cr of 0.86). No results found for this basename: VANCOTROUGH, VANCOPEAK, VANCORANDOM, GENTTROUGH, GENTPEAK, GENTRANDOM, TOBRATROUGH, TOBRAPEAK, TOBRARND, AMIKACINPEAK, AMIKACINTROU, AMIKACIN,  in the last 72 hours   Microbiology: No results found for this or any previous visit (from the past 720 hour(s)).  Medical History: Past Medical History  Diagnosis Date  . Pneumonia     35 yr ago  . Neuromuscular disorder     Parkinsons disease  . Chronic kidney disease     BPH    Medications:  Scheduled:  . alfuzosin  10 mg Oral QHS  . carbidopa-levodopa  2 tablet Oral 5 X Daily  . clonazePAM  1 mg Oral Daily  . enoxaparin (LOVENOX) injection  40 mg Subcutaneous Q24H  . piperacillin-tazobactam (ZOSYN)  IV  3.375 g Intravenous Q8H  . rotigotine  1 patch Transdermal Daily  . sertraline  25 mg Oral Daily  . sodium chloride  3 mL Intravenous Q12H   Infusions:  . sodium chloride 100 mL/hr at 12/15/12 1626   Assessment: 67 yo male presented to ER with fever, chills with hx of parkinson's. Patient is s/p surgery for implantation of deep brain stimulator in [place] on 11/30/12. Patient was discharged home with indwelling catheter s/p that surgery. Per urologist on 11/10, patient has  nephrolithiasis. Patient with temp of 104 in ER and source of infection is likely urinary per notes to start broad spectrum abx's vanc per pharmacy dosing and Zosyn per Md dosing  Goal of Therapy:  Vancomycin trough level 15-20 mcg/ml  Plan:  1) Will start vancomycin 1250mg  IV q12 2) vancomycin trough at steady state if necessary   Hessie Knows, PharmD, BCPS Pager [phone] 12/15/2012 4:39 PM

## 2012-12-15 NOTE — ED Notes (Signed)
Pt reports fever this am of 101.37F. Pt reports worsening weakness also since this am. Pt has foley catheter for urinary retention after surgery 20 days ago for placement of brain transmitter.

## 2012-12-15 NOTE — H&P (Signed)
[facility] History and Physical  [name] [name] MWU:[id number] DOB: 1945-05-02 DOA: 12/15/2012   PCP: Tawny Hopping, PA-C   Chief Complaint: Fever, chills, malaise  HPI:  67 year old gentleman with a history of Parkinson's disease, nephrolithiasis, and BPH presents with one-day history of fever, chills, malaise. The patient had surgery for implantation of a deep brain stimulator (DBS) in [place] on 11/29/2012. After surgery, the patient had some urinary retention. He was discharged home with an indwelling Foley catheter. He was doing fine until this morning when he felt dizzy with no energy. He felt dizzy when he got up out of bed. There was no syncope or falls. He took his temperature and it was 101.51F. He was told to come to the emergency department. The patient denies any chest pain, shortness breath, coughing, hemoptysis, vomiting, diarrhea, headache, visual disturbance. The patient has some left upper quadrant pain. Apparently he went to see his urologist on 12/12/12, Dr. Brunilda Payor.  He was told he had nephrolithiasis per xrays at his office.  He was given some pain meds and sent home. He states that his abdominal pain is somewhat better than earlier in the week. He denies any diarrhea, hematochezia, melena. He has not been placed on any antibiotics.  In emergency department, the patient was noted to have a temperature of 104.24F. The patient was hemodynamically stable. WBC was 18.9. Serum sodium was 134. Otherwise BMP was unremarkable. The patient was given a dose of Rocephin after blood cultures were obtained. Urinalysis showed TNTC WBC and RBCs. CT brain did not reveal any fluid collections or acute abnormalities. Assessment/Plan: Sepsis -Likely due to urinary source -Present at the time of admission -With a history of nephrolithiasis and abdominal pain, CT abdomen and pelvis to rule out abscess and pyelonephritis -Blood cultures have been obtained -Empiric vancomycin and Zosyn  pending culture data -Continue IV fluids -Obtain chest x-ray -Check LFTs Parkinson's disease -His DBS has not been turned on yet -surgical sites look clean -Continue home medications UTI -Foley catheter was changed in the emergency department -May need a voiding trial prior to discharge -Antibiotics as discussed above Hyponatremia -Likely volume depletion -Continue IV normal saline       Past Medical History  Diagnosis Date  . Pneumonia     35 yr ago  . Neuromuscular disorder     Parkinsons disease  . Chronic kidney disease     BPH   Past Surgical History  Procedure Laterality Date  . 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 Lithotripsy  x2  67yr ago 9/12   Social History:  reports that he has never smoked. He does not have any smokeless tobacco history on file. He reports that he drinks alcohol. He reports that he does not use illicit drugs.   History reviewed. No pertinent family history.   Allergies  Allergen Reactions  . Flomax [Tamsulosin Hcl] Other (See Comments)    Achy joints      Prior to Admission medications   Medication Sig Start Date End Date Taking? Authorizing Provider  acetaminophen (TYLENOL) 500 MG tablet Take 1,000 mg by mouth every 6 (six) hours as needed. For pain.    Yes Historical Provider, MD  alfuzosin (UROXATRAL) 10 MG 24 hr tablet Take 10 mg by mouth at bedtime.    Yes Historical Provider, MD  ALPRAZolam Prudy Feeler) 0.25 MG tablet Take 0.25 mg by mouth at bedtime as needed for sleep.   Yes Historical Provider, MD  carbidopa-levodopa (SINEMET) 25-100 MG per tablet  Take 2 tablets by mouth 5 (five) times daily.    Yes Historical Provider, MD  clonazePAM (KLONOPIN) 1 MG tablet Take 1 mg by mouth daily.   Yes Historical Provider, MD  ibuprofen (ADVIL,MOTRIN) 200 MG tablet Take 200 mg by mouth every 6 (six) hours as needed for pain.   Yes Historical Provider, MD  Phenazopyridine HCl (AZO TABS PO) Take 2 tablets by mouth daily as needed (Urinary  pain relief).   Yes Historical Provider, MD  rotigotine (NEUPRO) 2 MG/24HR Place 1 patch onto the skin daily.   Yes Historical Provider, MD  sertraline (ZOLOFT) 25 MG tablet Take 25 mg by mouth daily.   Yes Historical Provider, MD  triamcinolone cream (KENALOG) 0.1 % Apply 1 application topically daily as needed. For ezcema.    Yes Historical Provider, MD    Review of Systems:  Constitutional:  No weight loss, night sweats No headache.  No vision loss.  No eye pain or scotoma ENT:  No Difficulty swallowing,Tooth/dental problems,Sore throat,   Cardio-vascular:  No chest pain, Orthopnea, PND, swelling in lower extremities, palpitations  GI:  No   nausea,diarrhea, loss of appetite, hematochezia, melena, heartburn, indigestion, Resp:  No shortness of breath with exertion or at rest. No cough. No coughing up of blood .No wheezing.No chest wall deformity  Skin:  no rash or lesions.  GU:  no dysuria, change in color of urine, no urgency or frequency. No flank pain.  Musculoskeletal:  No joint pain or swelling. No decreased range of motion. No back pain.  Psych:  No change in mood or affect. No depression or anxiety. Neurologic: No headache, no dysesthesia, no focal weakness, no vision loss. No syncope  Physical Exam: Filed Vitals:   12/15/12 1011 12/15/12 1130 12/15/12 1241 12/15/12 1434  BP: 133/67     Pulse: 98     Temp: 98.2 F (36.8 C) 104 F (40 C) 103 F (39.4 C) 98.6 F (37 C)  TempSrc: Oral Rectal Rectal Oral  Resp: 18     SpO2: 92%      General:  A&O x 3, NAD, nontoxic, pleasant/cooperative Head/Eye: No conjunctival hemorrhage, no icterus, [place]/AT, No nystagmus ENT:  No icterus,  No thrush, good dentition, no pharyngeal exudate Neck:  No masses, no lymphadenpathy, no bruits CV:  RRR, no rub, no gallop, no S3 Lung:  CTAB, good air movement, no wheeze, no rhonchi Abdomen: soft/mild LLQ pain, +BS, nondistended, no peritoneal signs Ext: No cyanosis, No rashes, No  petechiae, No lymphangitis, No edema   Labs on Admission:  Basic Metabolic Panel:  Recent Labs Lab 12/15/12 1040  NA 134*  K 4.1  CL 99  CO2 27  GLUCOSE 118*  BUN 16  CREATININE 0.86  CALCIUM 9.3   Liver Function Tests: No results found for this basename: AST, ALT, ALKPHOS, BILITOT, PROT, ALBUMIN,  in the last 168 hours No results found for this basename: LIPASE, AMYLASE,  in the last 168 hours No results found for this basename: AMMONIA,  in the last 168 hours CBC:  Recent Labs Lab 12/15/12 1040  WBC 18.9*  NEUTROABS 16.6*  HGB 13.3  HCT 40.7  MCV 91.3  PLT 456*   Cardiac Enzymes: No results found for this basename: CKTOTAL, CKMB, CKMBINDEX, TROPONINI,  in the last 168 hours BNP: No components found with this basename: POCBNP,  CBG: No results found for this basename: GLUCAP,  in the last 168 hours  Radiological Exams on Admission: Ct Head Wo Contrast  12/15/2012   CLINICAL DATA:  Fever, fatigue.  EXAM: CT HEAD WITHOUT CONTRAST  TECHNIQUE: Contiguous axial images were obtained from the base of the skull through the vertex without intravenous contrast.  COMPARISON:  None.  FINDINGS: Stimulator lead is seen entering the left frontal skull with tip in the brain parenchyma chest inferior to the left thalamus and above the cerebral peduncle. No mass effect or midline shift is noted. Ventricular size is within normal limits. There is no evidence of mass lesion, hemorrhage or acute infarction. Bony calvarium is intact except for previously described postoperative changes.  IMPRESSION: Stimulator lead seen entering left frontal skull with lead in left-sided brain parenchyma as described above. No acute intracranial abnormality seen.   Electronically Signed   By: Roque Lias M.D.   On: 12/15/2012 13:56        Time spent:70 minutes Code Status:   FULL Family Communication:    wife at bedside   Chloe Baig, DO  [facility] Pager [phone]  If 7PM-7AM, please  contact night-coverage [email or web address] Password [email or web address] 12/15/2012, 3:06 PM

## 2012-12-16 ENCOUNTER — Inpatient Hospital Stay (HOSPITAL_COMMUNITY): Payer: Medicare Other

## 2012-12-16 ENCOUNTER — Encounter (HOSPITAL_COMMUNITY): Payer: Self-pay | Admitting: Radiology

## 2012-12-16 DIAGNOSIS — N10 Acute tubulo-interstitial nephritis: Secondary | ICD-10-CM | POA: Diagnosis not present

## 2012-12-16 DIAGNOSIS — N201 Calculus of ureter: Secondary | ICD-10-CM | POA: Diagnosis not present

## 2012-12-16 DIAGNOSIS — N390 Urinary tract infection, site not specified: Secondary | ICD-10-CM | POA: Diagnosis not present

## 2012-12-16 DIAGNOSIS — N200 Calculus of kidney: Secondary | ICD-10-CM | POA: Diagnosis not present

## 2012-12-16 DIAGNOSIS — N1330 Unspecified hydronephrosis: Secondary | ICD-10-CM | POA: Diagnosis not present

## 2012-12-16 DIAGNOSIS — R509 Fever, unspecified: Secondary | ICD-10-CM | POA: Diagnosis not present

## 2012-12-16 DIAGNOSIS — A419 Sepsis, unspecified organism: Secondary | ICD-10-CM | POA: Diagnosis not present

## 2012-12-16 HISTORY — PX: SP PERC NEPHROSTOMY: HXRAD349

## 2012-12-16 LAB — CBC
MCV: 92 fL (ref 78.0–100.0)
Platelets: 420 10*3/uL — ABNORMAL HIGH (ref 150–400)
RBC: 3.98 MIL/uL — ABNORMAL LOW (ref 4.22–5.81)
RDW: 13.1 % (ref 11.5–15.5)
WBC: 14.9 10*3/uL — ABNORMAL HIGH (ref 4.0–10.5)

## 2012-12-16 LAB — COMPREHENSIVE METABOLIC PANEL
AST: 11 U/L (ref 0–37)
Albumin: 2.6 g/dL — ABNORMAL LOW (ref 3.5–5.2)
BUN: 15 mg/dL (ref 6–23)
Calcium: 8.8 mg/dL (ref 8.4–10.5)
Chloride: 100 mEq/L (ref 96–112)
Creatinine, Ser: 0.95 mg/dL (ref 0.50–1.35)
GFR calc non Af Amer: 84 mL/min — ABNORMAL LOW (ref >90)
Glucose, Bld: 119 mg/dL — ABNORMAL HIGH (ref 70–99)
Sodium: 135 mEq/L (ref 135–145)
Total Bilirubin: 0.5 mg/dL (ref 0.3–1.2)
Total Protein: 6.2 g/dL (ref 6.0–8.3)

## 2012-12-16 LAB — PROTIME-INR
INR: 1.11 (ref 0.00–1.49)
Prothrombin Time: 14.1 seconds (ref 11.6–15.2)

## 2012-12-16 MED ORDER — DOCUSATE SODIUM 100 MG PO CAPS
100.0000 mg | ORAL_CAPSULE | Freq: Two times a day (BID) | ORAL | Status: DC
  Administered 2012-12-16 – 2012-12-18 (×4): 100 mg via ORAL
  Filled 2012-12-16 (×5): qty 1

## 2012-12-16 MED ORDER — IOHEXOL 300 MG/ML  SOLN
10.0000 mL | Freq: Once | INTRAMUSCULAR | Status: AC | PRN
  Administered 2012-12-16: 1 mL

## 2012-12-16 MED ORDER — FENTANYL CITRATE 0.05 MG/ML IJ SOLN
INTRAMUSCULAR | Status: AC | PRN
  Administered 2012-12-16 (×3): 50 ug via INTRAVENOUS
  Administered 2012-12-16: 100 ug via INTRAVENOUS

## 2012-12-16 MED ORDER — IBUPROFEN 600 MG PO TABS
600.0000 mg | ORAL_TABLET | Freq: Once | ORAL | Status: DC
  Administered 2012-12-16: 600 mg via ORAL
  Filled 2012-12-16: qty 1

## 2012-12-16 MED ORDER — SENNA 8.6 MG PO TABS
1.0000 | ORAL_TABLET | Freq: Every day | ORAL | Status: DC
  Administered 2012-12-16 – 2012-12-17 (×2): 8.6 mg via ORAL
  Filled 2012-12-16 (×2): qty 1

## 2012-12-16 MED ORDER — MIDAZOLAM HCL 2 MG/2ML IJ SOLN
INTRAMUSCULAR | Status: AC | PRN
  Administered 2012-12-16 (×4): 1 mg via INTRAVENOUS

## 2012-12-16 MED ORDER — LIDOCAINE HCL 1 % IJ SOLN
INTRAMUSCULAR | Status: AC
  Administered 2012-12-16: 15:00:00
  Filled 2012-12-16: qty 20

## 2012-12-16 MED ORDER — MIDAZOLAM HCL 2 MG/2ML IJ SOLN
INTRAMUSCULAR | Status: AC
  Administered 2012-12-16: 15:00:00
  Filled 2012-12-16: qty 6

## 2012-12-16 MED ORDER — FENTANYL CITRATE 0.05 MG/ML IJ SOLN
INTRAMUSCULAR | Status: AC
  Filled 2012-12-16: qty 6

## 2012-12-16 NOTE — Consult Note (Signed)
Urology Consult  Referring physician: Dr Onalee Hua Tat Reason for referral: Urosepsis, left distal ureteral calculi  Chief Complaint: Chills, fever, malaise.  Left sided abdominal pain.  History of Present Illness: [name] is a 67 years old male who was seen in the ER yesterday with fever, chills, malaise of one day duration.  He has an indwelling Foley catheter for urinary retention and is on Uroxatral.  He was seen in the office 4 days ago for LLQ pain.  KUB showed several stones in the left distal ureter.  He was scheduled for stone manipulation.  But yesterday he started having the above symptoms and was brought to the ER by his wife.  CT scan showed chronic left hydronephrosis with echogenic areas of the renal pelvis, thinning of the left renal parenchyma and several stones in the left distal ureter.  There are also 2 small non obstructing stones in the right kidney.  He had bladder stones extraction on 09/25/10 and ESL left proximal ureteral calculus twice on 9/8 and 01/05/11.  Past Medical History  Diagnosis Date  . Pneumonia     35 yr ago  . Neuromuscular disorder     Parkinsons disease  . Chronic kidney disease     BPH   Past Surgical History  Procedure Laterality Date  . 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 Lithotripsy  x2  20yr ago 9/12    Medications: Tylenol, Uroxatral, alprazolam, Sinemet, Klonopin, Phenazopyridine, Motrin, rotigotine, sertraline, triamcinolone cream Allergies:  Allergies  Allergen Reactions  . Flomax [Tamsulosin Hcl] Other (See Comments)    Achy joints    History reviewed. No pertinent family history. Social History:  reports that he has never smoked. He has never used smokeless tobacco. He reports that he drinks alcohol. He reports that he does not use illicit drugs.  ROS: All systems are reviewed and negative except as noted.   Physical Exam:  Vital signs in last 24 hours: Temp:  [98.1 F (36.7 C)-104 F (40 C)] 99.9 F (37.7 C) (11/14  0901) Pulse Rate:  [89-109] 92 (11/14 0444) Resp:  [18] 18 (11/14 0444) BP: (126-133)/(62-63) 126/62 mmHg (11/14 0444) SpO2:  [93 %-95 %] 95 % (11/14 0444) Weight:  [86.6 kg (190 lb 14.7 oz)] 86.6 kg (190 lb 14.7 oz) (11/13 1558) HEENT: Normal. Cardiovascular: Skin warm; not flushed Respiratory: Breaths quiet; no shortness of breath Abdomen: No masses Neurological: Normal sensation to touch Musculoskeletal: Normal motor function arms and legs.  Has tremors secondary to Parkinson disease Lymphatics: No inguinal adenopathy Skin: No rashes Genitourinary:Penis is circumcised.  Has indwelling Foley catheter that is draining clear urine.  Urine and blood cultures pending. Hgb: 13.3  Hct: 40.7  WBC: 18.9 BUN: 16  Creat: 0.86  Laboratory Data:  Results for orders placed during the hospital encounter of 12/15/12 (from the past 72 hour(s))  CBC WITH DIFFERENTIAL     Status: Abnormal   Collection Time    12/15/12 10:40 AM      Result Value Range   WBC 18.9 (*) 4.0 - 10.5 K/uL   RBC 4.46  4.22 - 5.81 MIL/uL   Hemoglobin 13.3  13.0 - 17.0 g/dL   HCT 16.1  09.6 - 04.5 %   MCV 91.3  78.0 - 100.0 fL   MCH 29.8  26.0 - 34.0 pg   MCHC 32.7  30.0 - 36.0 g/dL   RDW 40.9  81.1 - 91.4 %   Platelets 456 (*) 150 - 400 K/uL  Neutrophils Relative % 88 (*) 43 - 77 %   Neutro Abs 16.6 (*) 1.7 - 7.7 K/uL   Lymphocytes Relative 4 (*) 12 - 46 %   Lymphs Abs 0.8  0.7 - 4.0 K/uL   Monocytes Relative 8  3 - 12 %   Monocytes Absolute 1.5 (*) 0.1 - 1.0 K/uL   Eosinophils Relative 0  0 - 5 %   Eosinophils Absolute 0.0  0.0 - 0.7 K/uL   Basophils Relative 0  0 - 1 %   Basophils Absolute 0.0  0.0 - 0.1 K/uL  BASIC METABOLIC PANEL     Status: Abnormal   Collection Time    12/15/12 10:40 AM      Result Value Range   Sodium 134 (*) 135 - 145 mEq/L   Potassium 4.1  3.5 - 5.1 mEq/L   Chloride 99  96 - 112 mEq/L   CO2 27  19 - 32 mEq/L   Glucose, Bld 118 (*) 70 - 99 mg/dL   BUN 16  6 - 23 mg/dL    Creatinine, Ser 9.62  0.50 - 1.35 mg/dL   Calcium 9.3  8.4 - 95.2 mg/dL   GFR calc non Af Amer 87 (*) >90 mL/min   GFR calc Af Amer >90  >90 mL/min   Comment: (NOTE)     The eGFR has been calculated using the CKD EPI equation.     This calculation has not been validated in all clinical situations.     eGFR's persistently <90 mL/min signify possible Chronic Kidney     Disease.  URINALYSIS, ROUTINE W REFLEX MICROSCOPIC     Status: Abnormal   Collection Time    12/15/12 12:01 PM      Result Value Range   Color, Urine YELLOW  YELLOW   APPearance CLOUDY (*) CLEAR   Specific Gravity, Urine 1.022  1.005 - 1.030   pH 6.5  5.0 - 8.0   Glucose, UA NEGATIVE  NEGATIVE mg/dL   Hgb urine dipstick LARGE (*) NEGATIVE   Bilirubin Urine NEGATIVE  NEGATIVE   Ketones, ur 40 (*) NEGATIVE mg/dL   Protein, ur 30 (*) NEGATIVE mg/dL   Urobilinogen, UA 0.2  0.0 - 1.0 mg/dL   Nitrite NEGATIVE  NEGATIVE   Leukocytes, UA LARGE (*) NEGATIVE  URINE MICROSCOPIC-ADD ON     Status: Abnormal   Collection Time    12/15/12 12:01 PM      Result Value Range   Squamous Epithelial / LPF RARE  RARE   WBC, UA TOO NUMEROUS TO COUNT  <3 WBC/hpf   RBC / HPF TOO NUMEROUS TO COUNT  <3 RBC/hpf   Bacteria, UA FEW (*) RARE   Urine-Other MUCOUS PRESENT    URINE CULTURE     Status: None   Collection Time    12/15/12 12:01 PM      Result Value Range   Specimen Description URINE, CATHETERIZED     Special Requests NONE     Culture  Setup Time       Value: 12/15/2012 18:08     Performed at [facility]   Culture       Value: Culture reincubated for better growth     Performed at [facility]   Report Status PENDING    CULTURE, BLOOD (ROUTINE X 2)     Status: None   Collection Time    12/15/12  2:14 PM      Result Value Range   Specimen  Description BLOOD  L HAND     Special Requests NONE BOTTLES DRAWN AEROBIC AND ANAEROBIC 5CC     Culture  Setup Time       Value: 12/15/2012 23:26     Performed at  [facility]   Culture       Value:        BLOOD CULTURE RECEIVED NO GROWTH TO DATE CULTURE WILL BE HELD FOR 5 DAYS BEFORE ISSUING A FINAL NEGATIVE REPORT     Performed at [facility]   Report Status PENDING    CULTURE, BLOOD (ROUTINE X 2)     Status: None   Collection Time    12/15/12  2:21 PM      Result Value Range   Specimen Description BLOOD RIGHT ANTECUBITAL     Special Requests NONE BOTTLES DRAWN AEROBIC AND ANAEROBIC 7CC     Culture  Setup Time       Value: 12/15/2012 23:26     Performed at [facility]   Culture       Value:        BLOOD CULTURE RECEIVED NO GROWTH TO DATE CULTURE WILL BE HELD FOR 5 DAYS BEFORE ISSUING A FINAL NEGATIVE REPORT     Performed at [facility]   Report Status PENDING    HEPATIC FUNCTION PANEL     Status: Abnormal   Collection Time    12/15/12  4:57 PM      Result Value Range   Total Protein 7.1  6.0 - 8.3 g/dL   Albumin 3.2 (*) 3.5 - 5.2 g/dL   AST 8  0 - 37 U/L   ALT <5  0 - 53 U/L   Alkaline Phosphatase 70  39 - 117 U/L   Total Bilirubin 0.5  0.3 - 1.2 mg/dL   Bilirubin, Direct <5.6  0.0 - 0.3 mg/dL   Indirect Bilirubin NOT CALCULATED  0.3 - 0.9 mg/dL  COMPREHENSIVE METABOLIC PANEL     Status: Abnormal   Collection Time    12/16/12  5:07 AM      Result Value Range   Sodium 135  135 - 145 mEq/L   Potassium 4.1  3.5 - 5.1 mEq/L   Chloride 100  96 - 112 mEq/L   CO2 27  19 - 32 mEq/L   Glucose, Bld 119 (*) 70 - 99 mg/dL   BUN 15  6 - 23 mg/dL   Creatinine, Ser 2.13  0.50 - 1.35 mg/dL   Calcium 8.8  8.4 - 08.6 mg/dL   Total Protein 6.2  6.0 - 8.3 g/dL   Albumin 2.6 (*) 3.5 - 5.2 g/dL   AST 11  0 - 37 U/L   ALT <5  0 - 53 U/L   Alkaline Phosphatase 65  39 - 117 U/L   Total Bilirubin 0.5  0.3 - 1.2 mg/dL   GFR calc non Af Amer 84 (*) >90 mL/min   GFR calc Af Amer >90  >90 mL/min   Comment: (NOTE)     The eGFR has been calculated using the CKD EPI equation.     This calculation has not been validated  in all clinical situations.     eGFR's persistently <90 mL/min signify possible Chronic Kidney     Disease.  CBC     Status: Abnormal   Collection Time    12/16/12  5:07 AM      Result Value Range   WBC 14.9 (*) 4.0 -  10.5 K/uL   RBC 3.98 (*) 4.22 - 5.81 MIL/uL   Hemoglobin 12.0 (*) 13.0 - 17.0 g/dL   HCT 16.1 (*) 09.6 - 04.5 %   MCV 92.0  78.0 - 100.0 fL   MCH 30.2  26.0 - 34.0 pg   MCHC 32.8  30.0 - 36.0 g/dL   RDW 40.9  81.1 - 91.4 %   Platelets 420 (*) 150 - 400 K/uL  PROTIME-INR     Status: None   Collection Time    12/16/12 11:47 AM      Result Value Range   Prothrombin Time 14.1  11.6 - 15.2 seconds   INR 1.11  0.00 - 1.49   Recent Results (from the past 240 hour(s))  URINE CULTURE     Status: None   Collection Time    12/15/12 12:01 PM      Result Value Range Status   Specimen Description URINE, CATHETERIZED   Final   Special Requests NONE   Final   Culture  Setup Time     Final   Value: 12/15/2012 18:08     Performed at [facility]   Culture     Final   Value: Culture reincubated for better growth     Performed at [facility]   Report Status PENDING   Incomplete  CULTURE, BLOOD (ROUTINE X 2)     Status: None   Collection Time    12/15/12  2:14 PM      Result Value Range Status   Specimen Description BLOOD  L HAND   Final   Special Requests NONE BOTTLES DRAWN AEROBIC AND ANAEROBIC 5CC   Final   Culture  Setup Time     Final   Value: 12/15/2012 23:26     Performed at [facility]   Culture     Final   Value:        BLOOD CULTURE RECEIVED NO GROWTH TO DATE CULTURE WILL BE HELD FOR 5 DAYS BEFORE ISSUING A FINAL NEGATIVE REPORT     Performed at [facility]   Report Status PENDING   Incomplete  CULTURE, BLOOD (ROUTINE X 2)     Status: None   Collection Time    12/15/12  2:21 PM      Result Value Range Status   Specimen Description BLOOD RIGHT ANTECUBITAL   Final   Special Requests NONE BOTTLES DRAWN AEROBIC AND ANAEROBIC [facility]    Final   Culture  Setup Time     Final   Value: 12/15/2012 23:26     Performed at [facility]   Culture     Final   Value:        BLOOD CULTURE RECEIVED NO GROWTH TO DATE CULTURE WILL BE HELD FOR 5 DAYS BEFORE ISSUING A FINAL NEGATIVE REPORT     Performed at [facility]   Report Status PENDING   Incomplete   Creatinine:  Recent Labs  12/15/12 1040 12/16/12 0507  CREATININE 0.86 0.95    Xrays: See report/chart   Impression/Assessment:  Left distal ureteral calculi.  Left hydronephrosis.  Left pyelonephritis.  ? Pyonephrosis. Urosepsis.  Right renal calculi.  Parkinson disease.  Plan: I believe we can get better drainage of the kidney with PCN than JJ stent insertion in the setting of urosepsis.  I consulted with Dr Fredia Sorrow regarding left percutaneous nephrostomy catheter placement Continue IV antibiotics. Leave Foley indwelling. Will proceed with distal ureteral stone manipulation when he  Recovers from septic episode.  That will be done as outpatient. Sommer Spickard-HENRY 12/16/2012, 12:57 PM   CC: Dr Onalee Hua Tat

## 2012-12-16 NOTE — Progress Notes (Signed)
Patient ID: [name] [name], male   DOB: 25-Feb-1945, 67 y.o.   MRN: [id number] Afebrile. V/S stable.  No flank pain. Left nephrostomy catheter draining clear urine. Continue IV Zosyn and Vancomycin.

## 2012-12-16 NOTE — Consult Note (Signed)
Agree with above 

## 2012-12-16 NOTE — Progress Notes (Signed)
[facility] PROGRESS NOTE  [name] [name] AVW:[id number] DOB: [birth year] DOA: 12/15/2012 PCP: Tawny Hopping, PA-C  Assessment/Plan: Sepsis  -Likely due to urinary source  -Present at the time of admission  -Blood cultures have been obtained  -Empiric vancomycin and Zosyn pending culture data  -Continue IV fluids  -Obtain chest x-ray--left basilar atelectasis Pyelonephritis/nephrolithiasis/L-hydronephrosis -CT abd/pelvis--numerous L-ureteral stones with L-UVJ stone with concerned for infected L-renal cyst -consult urology -Foley catheter was changed in the emergency department  -May need a voiding trial prior to discharge  -Antibiotics as discussed above Parkinson's disease  -His DBS has not been turned on yet  -surgical sites look clean  -Continue home medications    Hyponatremia  -Likely volume depletion  -Continue IV normal saline  Family Communication:   wife at beside Disposition Plan:   Home when medically stable     Antibiotics:  Vancomycin 12/15/12>>>  Zosyn 12/15/12>>>    Procedures/Studies: Ct Head Wo Contrast  12/15/2012   CLINICAL DATA:  Fever, fatigue.  EXAM: CT HEAD WITHOUT CONTRAST  TECHNIQUE: Contiguous axial images were obtained from the base of the skull through the vertex without intravenous contrast.  COMPARISON:  None.  FINDINGS: Stimulator lead is seen entering the left frontal skull with tip in the brain parenchyma chest inferior to the left thalamus and above the cerebral peduncle. No mass effect or midline shift is noted. Ventricular size is within normal limits. There is no evidence of mass lesion, hemorrhage or acute infarction. Bony calvarium is intact except for previously described postoperative changes.  IMPRESSION: Stimulator lead seen entering left frontal skull with lead in left-sided brain parenchyma as described above. No acute intracranial abnormality seen.   Electronically Signed   By: Roque Lias M.D.   On: 12/15/2012 13:56   Ct  Abdomen Pelvis W Contrast  12/15/2012   CLINICAL DATA:  Abdominal pain with fever and leukocytosis. Persistent urinary tract infection. Nephrolithiasis.  EXAM: CT ABDOMEN AND PELVIS WITH CONTRAST  TECHNIQUE: Multidetector CT imaging of the abdomen and pelvis was performed using the standard protocol following bolus administration of intravenous contrast.  CONTRAST:  OMNIPAQUE IOHEXOL 300 MG/ML  SOLN  COMPARISON:  CT scan dated 09/25/2010 and abdominal radiographs dated 01/05/2011  FINDINGS: There is a small left pleural effusion with adjacent atelectasis in the left lower lobe. Slight pleural thickening posteriorly at the right base. Heart size is normal.  The patient has chronic left hydronephrosis with thickening of the mucosa renal pelvis and infundibular a and left ureter consistent with the history of persistent urinary tract infection. The patient has numerous prominent stones in the distal left ureter over a 2.4 cm distance as well as a 4 mm stone at the left ureteral vesical junction. There are 2 tiny stones in the posterior aspect of the right side of the bladder near the right ureteral vesicle junction. Right renal collecting system is not dilated. Two small stones in the lower pole the right kidney.  There are no left renal stones. There is an irregular thick-walled cyst in the lateral aspect of the left kidney, 3.3 cm. The possibility of an infected cyst should be considered. There is an 8 mm cyst in the posterior aspect of the midportion of the kidney. There is a 9 mm cyst in the anterior aspect of the mid right kidney.  Foley catheter in the bladder.  The liver, spleen, pancreas, and adrenal glands are normal. Single 7 mm stone in the gallbladder. The gallbladder is not distended. The stone is  slightly larger than on the prior exam. No significant osseous abnormality.  The bowel appears normal including the terminal ileum and appendix.  IMPRESSION: 1. Chronic obstruction of the distal left  ureter by numerous stones including a tiny stone of the ureterovesical junction and multiple stones over a 2.4 cm segment of the distal ureter. 2. Thickening of the mucosa of the left renal collecting system consistent with chronic infection. Thick-walled cyst in the lateral aspect of the left kidney may be infected as well. 3. Tiny stones in the posterior aspect of the bladder. 4. Small left pleural effusion with left base atelectasis. 5. Solitary gallstone.   Electronically Signed   By: Geanie Cooley M.D.   On: 12/15/2012 20:00   Dg Chest Port 1 View  12/15/2012   CLINICAL DATA:  Fever, sirs  EXAM: PORTABLE CHEST - 1 VIEW  COMPARISON:  May 22, 2012.  FINDINGS: The lungs are adequately inflated. There are coarse lung markings at the left lung base which are more conspicuous than in the past. The cardiac silhouette is normal in size. There perihilar lung markings are not as conspicuous today. There is an electrode stimulating device generator present over the left pectoral region with electrodes extending off the film into the left neck. The bony structures exhibit no acute abnormality.  IMPRESSION: 1. There is likely atelectasis at the left lung base. A follow-up PA and lateral chest x-ray would be of value. 2. There is no evidence of CHF   Electronically Signed   By: Odessie Polzin  [place]   On: 12/15/2012 17:19         Subjective: Patient denies any cp, sob, diarrhea.  Abdominal pain is somewhat better.  No HA, visual disturbance.  Had F/C  Objective: Filed Vitals:   12/16/12 0115 12/16/12 0300 12/16/12 0444 12/16/12 0645  BP:   126/62   Pulse:   92   Temp: 100.4 F (38 C) 100.6 F (38.1 C) 101 F (38.3 C) 101.2 F (38.4 C)  TempSrc: Rectal Rectal Rectal Rectal  Resp:   18   Height:      Weight:      SpO2:   95%     Intake/Output Summary (Last 24 hours) at 12/16/12 0829 Last data filed at 12/16/12 0700  Gross per 24 hour  Intake   1560 ml  Output   1475 ml  Net     85 ml   Weight  change:  Exam:   General:  Pt is alert, follows commands appropriately, not in acute distress  HEENT: No icterus, No thrush,  [place]/AT  Cardiovascular: RRR, S1/S2, no rubs, no gallops  Respiratory: bibasilar crackles, no wheeze, good air movt  Abdomen: Soft/+BS, mild LUQ tender, non distended, no guarding  Extremities: trace LE edema, No lymphangitis, No petechiae, No rashes, no synovitis; head and chest surgical sites neg for erythema or drainage.  Data Reviewed: Basic Metabolic Panel:  Recent Labs Lab 12/15/12 1040 12/16/12 0507  NA 134* 135  K 4.1 4.1  CL 99 100  CO2 27 27  GLUCOSE 118* 119*  BUN 16 15  CREATININE 0.86 0.95  CALCIUM 9.3 8.8   Liver Function Tests:  Recent Labs Lab 12/15/12 1657 12/16/12 0507  AST 8 11  ALT <5 <5  ALKPHOS 70 65  BILITOT 0.5 0.5  PROT 7.1 6.2  ALBUMIN 3.2* 2.6*   No results found for this basename: LIPASE, AMYLASE,  in the last 168 hours No results found for this basename: AMMONIA,  in the last 168 hours CBC:  Recent Labs Lab 12/15/12 1040 12/16/12 0507  WBC 18.9* 14.9*  NEUTROABS 16.6*  --   HGB 13.3 12.0*  HCT 40.7 36.6*  MCV 91.3 92.0  PLT 456* 420*   Cardiac Enzymes: No results found for this basename: CKTOTAL, CKMB, CKMBINDEX, TROPONINI,  in the last 168 hours BNP: No components found with this basename: POCBNP,  CBG: No results found for this basename: GLUCAP,  in the last 168 hours  Recent Results (from the past 240 hour(s))  CULTURE, BLOOD (ROUTINE X 2)     Status: None   Collection Time    12/15/12  2:14 PM      Result Value Range Status   Specimen Description BLOOD  L HAND   Final   Special Requests NONE BOTTLES DRAWN AEROBIC AND ANAEROBIC 5CC   Final   Culture  Setup Time     Final   Value: 12/15/2012 23:26     Performed at [facility]   Culture     Final   Value:        BLOOD CULTURE RECEIVED NO GROWTH TO DATE CULTURE WILL BE HELD FOR 5 DAYS BEFORE ISSUING A FINAL NEGATIVE REPORT      Performed at [facility]   Report Status PENDING   Incomplete  CULTURE, BLOOD (ROUTINE X 2)     Status: None   Collection Time    12/15/12  2:21 PM      Result Value Range Status   Specimen Description BLOOD RIGHT ANTECUBITAL   Final   Special Requests NONE BOTTLES DRAWN AEROBIC AND ANAEROBIC [facility]   Final   Culture  Setup Time     Final   Value: 12/15/2012 23:26     Performed at [facility]   Culture     Final   Value:        BLOOD CULTURE RECEIVED NO GROWTH TO DATE CULTURE WILL BE HELD FOR 5 DAYS BEFORE ISSUING A FINAL NEGATIVE REPORT     Performed at [facility]   Report Status PENDING   Incomplete     Scheduled Meds: . alfuzosin  10 mg Oral QHS  . carbidopa-levodopa  2 tablet Oral 5 X Daily  . clonazePAM  1 mg Oral QHS  . enoxaparin (LOVENOX) injection  40 mg Subcutaneous Q24H  . piperacillin-tazobactam (ZOSYN)  IV  3.375 g Intravenous Q8H  . rotigotine  1 patch Transdermal Daily  . sertraline  25 mg Oral Daily  . sodium chloride  3 mL Intravenous Q12H  . vancomycin  1,250 mg Intravenous Q12H   Continuous Infusions: . sodium chloride 100 mL/hr at 12/15/12 1626     Grayling Schranz, DO  [facility] Pager [phone]  If 7PM-7AM, please contact night-coverage [email or web address] Password [email or web address] 12/16/2012, 8:29 AM   LOS: 1 day

## 2012-12-16 NOTE — Procedures (Signed)
Procedure:  Left percutaneous nephrostomy Findings:  Signif left hydro.  10 Fr PCN placed into renal pelvis.  Attached to gravity bag drainage.  Urine sample sent for culture.

## 2012-12-16 NOTE — Progress Notes (Signed)
NT took patient's temp orally at 2211 and it was 100.7, but upon assessment, patient was very hot to touch and shivering violently. Patient was also nauseated and dry-heaving. Took patient's temp rectally and it was 104. Tylenol suppository administered. At midnight, patient's temp had dropped to 102.5 rectally and then at 0115 patient's temp had dropped to 100.4 rectally. Patient stated, "I feel much better." Will continue to monitor patient.  [name] Cha

## 2012-12-16 NOTE — Consult Note (Signed)
HPI: [name] [name] is an 67 y.o. male admitted with sepsis likely from a urinary source. He is found to have (L)pyelo/hydronephrosis from ureteral stones. Urology has consulted the pt and feels is an above average risk for operative intervention and has requested IR to place a left perc nephrostomy. PMHx and meds reviewed. Recently underwent DBS placement in [place], [place] for Parkinson's. Was recovering well from that, staples remain in scalp.  Past Medical History:  Past Medical History  Diagnosis Date  . Pneumonia     35 yr ago  . Neuromuscular disorder     Parkinsons disease  . Chronic kidney disease     BPH    Past Surgical History:  Past Surgical History  Procedure Laterality Date  . 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 Lithotripsy  x2  26yr ago 9/12    Family History: History reviewed. No pertinent family history.  Social History:  reports that he has never smoked. He has never used smokeless tobacco. He reports that he drinks alcohol. He reports that he does not use illicit drugs.  Allergies:  Allergies  Allergen Reactions  . Flomax [Tamsulosin Hcl] Other (See Comments)    Achy joints    Medications:   Medication List    ASK your doctor about these medications       acetaminophen 500 MG tablet  Commonly known as:  TYLENOL  Take 1,000 mg by mouth every 6 (six) hours as needed. For pain.     alfuzosin 10 MG 24 hr tablet  Commonly known as:  UROXATRAL  Take 10 mg by mouth at bedtime.     ALPRAZolam 0.25 MG tablet  Commonly known as:  XANAX  Take 0.25 mg by mouth at bedtime as needed for sleep.     AZO TABS PO  Take 2 tablets by mouth daily as needed (Urinary pain relief).     carbidopa-levodopa 25-100 MG per tablet  Commonly known as:  SINEMET IR  Take 2 tablets by mouth 5 (five) times daily.     clonazePAM 1 MG tablet  Commonly known as:  KLONOPIN  Take 1 mg by mouth daily.     ibuprofen 200 MG tablet  Commonly known as:  ADVIL,MOTRIN  Take 200 mg  by mouth every 6 (six) hours as needed for pain.     rotigotine 2 MG/24HR  Commonly known as:  NEUPRO  Place 1 patch onto the skin daily.     sertraline 25 MG tablet  Commonly known as:  ZOLOFT  Take 25 mg by mouth daily.     triamcinolone cream 0.1 %  Commonly known as:  KENALOG  Apply 1 application topically daily as needed. For ezcema.        Please HPI for pertinent positives, otherwise complete 10 system ROS negative.  Physical Exam: BP 126/62  Pulse 92  Temp(Src) 99.9 F (37.7 C) (Rectal)  Resp 18  Ht 5\' 10"  (1.778 m)  Wt 190 lb 14.7 oz (86.6 kg)  BMI 27.39 kg/m2  SpO2 95% Body mass index is 27.39 kg/(m^2).   General Appearance:  Alert, cooperative, no distress, appears stated age  Head:  Post DBS surgical incisions healing well, staples intact  ENT: Unremarkable  Neck: Supple, symmetrical, trachea midline  Lungs:   Clear to auscultation bilaterally, no w/r/r,   Chest Wall:  No tenderness or deformity  Heart:  Regular rate and rhythm, S1, S2 normal, no murmur, rub or gallop.  Abdomen:   Soft, non-tender, non  distended.  Neurologic: Normal affect, no gross deficits.   Results for orders placed during the hospital encounter of 12/15/12 (from the past 48 hour(s))  CBC WITH DIFFERENTIAL     Status: Abnormal   Collection Time    12/15/12 10:40 AM      Result Value Range   WBC 18.9 (*) 4.0 - 10.5 K/uL   RBC 4.46  4.22 - 5.81 MIL/uL   Hemoglobin 13.3  13.0 - 17.0 g/dL   HCT 14.7  82.9 - 56.2 %   MCV 91.3  78.0 - 100.0 fL   MCH 29.8  26.0 - 34.0 pg   MCHC 32.7  30.0 - 36.0 g/dL   RDW 13.0  86.5 - 78.4 %   Platelets 456 (*) 150 - 400 K/uL   Neutrophils Relative % 88 (*) 43 - 77 %   Neutro Abs 16.6 (*) 1.7 - 7.7 K/uL   Lymphocytes Relative 4 (*) 12 - 46 %   Lymphs Abs 0.8  0.7 - 4.0 K/uL   Monocytes Relative 8  3 - 12 %   Monocytes Absolute 1.5 (*) 0.1 - 1.0 K/uL   Eosinophils Relative 0  0 - 5 %   Eosinophils Absolute 0.0  0.0 - 0.7 K/uL   Basophils  Relative 0  0 - 1 %   Basophils Absolute 0.0  0.0 - 0.1 K/uL  BASIC METABOLIC PANEL     Status: Abnormal   Collection Time    12/15/12 10:40 AM      Result Value Range   Sodium 134 (*) 135 - 145 mEq/L   Potassium 4.1  3.5 - 5.1 mEq/L   Chloride 99  96 - 112 mEq/L   CO2 27  19 - 32 mEq/L   Glucose, Bld 118 (*) 70 - 99 mg/dL   BUN 16  6 - 23 mg/dL   Creatinine, Ser 6.96  0.50 - 1.35 mg/dL   Calcium 9.3  8.4 - 29.5 mg/dL   GFR calc non Af Amer 87 (*) >90 mL/min   GFR calc Af Amer >90  >90 mL/min   Comment: (NOTE)     The eGFR has been calculated using the CKD EPI equation.     This calculation has not been validated in all clinical situations.     eGFR's persistently <90 mL/min signify possible Chronic Kidney     Disease.  URINALYSIS, ROUTINE W REFLEX MICROSCOPIC     Status: Abnormal   Collection Time    12/15/12 12:01 PM      Result Value Range   Color, Urine YELLOW  YELLOW   APPearance CLOUDY (*) CLEAR   Specific Gravity, Urine 1.022  1.005 - 1.030   pH 6.5  5.0 - 8.0   Glucose, UA NEGATIVE  NEGATIVE mg/dL   Hgb urine dipstick LARGE (*) NEGATIVE   Bilirubin Urine NEGATIVE  NEGATIVE   Ketones, ur 40 (*) NEGATIVE mg/dL   Protein, ur 30 (*) NEGATIVE mg/dL   Urobilinogen, UA 0.2  0.0 - 1.0 mg/dL   Nitrite NEGATIVE  NEGATIVE   Leukocytes, UA LARGE (*) NEGATIVE  URINE MICROSCOPIC-ADD ON     Status: Abnormal   Collection Time    12/15/12 12:01 PM      Result Value Range   Squamous Epithelial / LPF RARE  RARE   WBC, UA TOO NUMEROUS TO COUNT  <3 WBC/hpf   RBC / HPF TOO NUMEROUS TO COUNT  <3 RBC/hpf   Bacteria, UA FEW (*) RARE  Urine-Other MUCOUS PRESENT    URINE CULTURE     Status: None   Collection Time    12/15/12 12:01 PM      Result Value Range   Specimen Description URINE, CATHETERIZED     Special Requests NONE     Culture  Setup Time       Value: 12/15/2012 18:08     Performed at [facility]   Culture       Value: Culture reincubated for better growth      Performed at [facility]   Report Status PENDING    CULTURE, BLOOD (ROUTINE X 2)     Status: None   Collection Time    12/15/12  2:14 PM      Result Value Range   Specimen Description BLOOD  L HAND     Special Requests NONE BOTTLES DRAWN AEROBIC AND ANAEROBIC 5CC     Culture  Setup Time       Value: 12/15/2012 23:26     Performed at [facility]   Culture       Value:        BLOOD CULTURE RECEIVED NO GROWTH TO DATE CULTURE WILL BE HELD FOR 5 DAYS BEFORE ISSUING A FINAL NEGATIVE REPORT     Performed at [facility]   Report Status PENDING    CULTURE, BLOOD (ROUTINE X 2)     Status: None   Collection Time    12/15/12  2:21 PM      Result Value Range   Specimen Description BLOOD RIGHT ANTECUBITAL     Special Requests NONE BOTTLES DRAWN AEROBIC AND ANAEROBIC 7CC     Culture  Setup Time       Value: 12/15/2012 23:26     Performed at [facility]   Culture       Value:        BLOOD CULTURE RECEIVED NO GROWTH TO DATE CULTURE WILL BE HELD FOR 5 DAYS BEFORE ISSUING A FINAL NEGATIVE REPORT     Performed at [facility]   Report Status PENDING    HEPATIC FUNCTION PANEL     Status: Abnormal   Collection Time    12/15/12  4:57 PM      Result Value Range   Total Protein 7.1  6.0 - 8.3 g/dL   Albumin 3.2 (*) 3.5 - 5.2 g/dL   AST 8  0 - 37 U/L   ALT <5  0 - 53 U/L   Alkaline Phosphatase 70  39 - 117 U/L   Total Bilirubin 0.5  0.3 - 1.2 mg/dL   Bilirubin, Direct <1.6  0.0 - 0.3 mg/dL   Indirect Bilirubin NOT CALCULATED  0.3 - 0.9 mg/dL  COMPREHENSIVE METABOLIC PANEL     Status: Abnormal   Collection Time    12/16/12  5:07 AM      Result Value Range   Sodium 135  135 - 145 mEq/L   Potassium 4.1  3.5 - 5.1 mEq/L   Chloride 100  96 - 112 mEq/L   CO2 27  19 - 32 mEq/L   Glucose, Bld 119 (*) 70 - 99 mg/dL   BUN 15  6 - 23 mg/dL   Creatinine, Ser 1.09  0.50 - 1.35 mg/dL   Calcium 8.8  8.4 - 60.4 mg/dL   Total Protein 6.2  6.0 - 8.3 g/dL    Albumin 2.6 (*) 3.5 - 5.2 g/dL   AST 11  0 -  37 U/L   ALT <5  0 - 53 U/L   Alkaline Phosphatase 65  39 - 117 U/L   Total Bilirubin 0.5  0.3 - 1.2 mg/dL   GFR calc non Af Amer 84 (*) >90 mL/min   GFR calc Af Amer >90  >90 mL/min   Comment: (NOTE)     The eGFR has been calculated using the CKD EPI equation.     This calculation has not been validated in all clinical situations.     eGFR's persistently <90 mL/min signify possible Chronic Kidney     Disease.  CBC     Status: Abnormal   Collection Time    12/16/12  5:07 AM      Result Value Range   WBC 14.9 (*) 4.0 - 10.5 K/uL   RBC 3.98 (*) 4.22 - 5.81 MIL/uL   Hemoglobin 12.0 (*) 13.0 - 17.0 g/dL   HCT 40.9 (*) 81.1 - 91.4 %   MCV 92.0  78.0 - 100.0 fL   MCH 30.2  26.0 - 34.0 pg   MCHC 32.8  30.0 - 36.0 g/dL   RDW 78.2  95.6 - 21.3 %   Platelets 420 (*) 150 - 400 K/uL   Ct Head Wo Contrast  12/15/2012   CLINICAL DATA:  Fever, fatigue.  EXAM: CT HEAD WITHOUT CONTRAST  TECHNIQUE: Contiguous axial images were obtained from the base of the skull through the vertex without intravenous contrast.  COMPARISON:  None.  FINDINGS: Stimulator lead is seen entering the left frontal skull with tip in the brain parenchyma chest inferior to the left thalamus and above the cerebral peduncle. No mass effect or midline shift is noted. Ventricular size is within normal limits. There is no evidence of mass lesion, hemorrhage or acute infarction. Bony calvarium is intact except for previously described postoperative changes.  IMPRESSION: Stimulator lead seen entering left frontal skull with lead in left-sided brain parenchyma as described above. No acute intracranial abnormality seen.   Electronically Signed   By: Roque Lias M.D.   On: 12/15/2012 13:56   Ct Abdomen Pelvis W Contrast  12/15/2012   CLINICAL DATA:  Abdominal pain with fever and leukocytosis. Persistent urinary tract infection. Nephrolithiasis.  EXAM: CT ABDOMEN AND PELVIS WITH CONTRAST   TECHNIQUE: Multidetector CT imaging of the abdomen and pelvis was performed using the standard protocol following bolus administration of intravenous contrast.  CONTRAST:  OMNIPAQUE IOHEXOL 300 MG/ML  SOLN  COMPARISON:  CT scan dated 09/25/2010 and abdominal radiographs dated 01/05/2011  FINDINGS: There is a small left pleural effusion with adjacent atelectasis in the left lower lobe. Slight pleural thickening posteriorly at the right base. Heart size is normal.  The patient has chronic left hydronephrosis with thickening of the mucosa renal pelvis and infundibular a and left ureter consistent with the history of persistent urinary tract infection. The patient has numerous prominent stones in the distal left ureter over a 2.4 cm distance as well as a 4 mm stone at the left ureteral vesical junction. There are 2 tiny stones in the posterior aspect of the right side of the bladder near the right ureteral vesicle junction. Right renal collecting system is not dilated. Two small stones in the lower pole the right kidney.  There are no left renal stones. There is an irregular thick-walled cyst in the lateral aspect of the left kidney, 3.3 cm. The possibility of an infected cyst should be considered. There is an 8 mm cyst in the posterior  aspect of the midportion of the kidney. There is a 9 mm cyst in the anterior aspect of the mid right kidney.  Foley catheter in the bladder.  The liver, spleen, pancreas, and adrenal glands are normal. Single 7 mm stone in the gallbladder. The gallbladder is not distended. The stone is slightly larger than on the prior exam. No significant osseous abnormality.  The bowel appears normal including the terminal ileum and appendix.  IMPRESSION: 1. 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 2. Thickening of the mucosa of the left renal collecting  system consistent with chronic infection. Thick-walled cyst in the lateral aspect of the left kidney may be infected as well. 3. Tiny stones in the posterior aspect of the bladder. 4. Small left pleural effusion with left base atelectasis. 5. Solitary gallstone.   Electronically Signed   By: Geanie Cooley M.D.   On: 12/15/2012 20:00   Dg Chest Port 1 View  12/15/2012   CLINICAL DATA:  Fever, sirs  EXAM: PORTABLE CHEST - 1 VIEW  COMPARISON:  May 22, 2012.  FINDINGS: The lungs are adequately inflated. There are coarse lung markings at the left lung base which are more conspicuous than in the past. The cardiac silhouette is normal in size. There perihilar lung markings are not as conspicuous today. There is an electrode stimulating device generator present over the left pectoral region with electrodes extending off the film into the left neck. The bony structures exhibit no acute abnormality.  IMPRESSION: 1. There is likely atelectasis at the left lung base. A follow-up PA and lateral chest x-ray would be of value. 2. There is no evidence of CHF   Electronically Signed   By: [name]  [place]   On: 12/15/2012 17:19    Assessment/Plan Left sided hydronephrosis from ureteral stones. Secondary pyelonephritis with early Urosepsis as well. Discussed with pt and wife plan for (L)PCN placement in lieu of operative approach. Explained procedure, risks, complications, use of moderate sedation. Pt received Lovenox at 8pm last night. Ate breakfast around 0830 this am. Labs reviewed, checking PT/INR Consent signed in chart  Brayton El PA-C 12/16/2012, 11:15 AM

## 2012-12-16 NOTE — Progress Notes (Signed)
Staples and sutures removed from pt's head. Tolerated well. Johneric Mcfadden, Lavone Orn, RN

## 2012-12-16 NOTE — Care Management Note (Addendum)
    Page 1 of 1   12/19/2012     11:25:13 AM   CARE MANAGEMENT NOTE 12/19/2012  Patient:  [name] [name]   Account Number:  [id number]  Date Initiated:  12/16/2012  Documentation initiated by:  Lanier Clam  Subjective/Objective Assessment:   67 Y/O M ADMITTED W/SEPSIS.ON:[id number].     Action/Plan:   FROM HOME W/SPOUSE.HAS CANE,RW,W/C.HAS PCP,PHARMACY.   Anticipated DC Date:  12/19/2012   Anticipated DC Plan:  HOME W HOME HEALTH SERVICES      DC Planning Services  CM consult      Choice offered to / List presented to:  C-1 Patient        HH arranged  HH-1 RN  HH-2 PT      [facility] agency  [facility]   Status of service:  Completed, signed off Medicare Important Message given?   (If response is "NO", the following Medicare IM given date fields will be blank) Date Medicare IM given:   Date Additional Medicare IM given:    Discharge Disposition:  HOME W HOME HEALTH SERVICES  Per UR Regulation:  Reviewed for med. necessity/level of care/duration of stay  If discussed at Long Length of Stay Meetings, dates discussed:    Comments:  12/19/12 [name] Prude RN,BSN NCM [phone] [facility] CHOSEN FOR HH.TC [name] [name] FOR [facility]-AWARE OF HHC ORDERS.THEY WILL PROVIDE F/C,& SUPPLIES FOR INSTRUCTION FOR NEPHROSTOMY TUBE.   12/16/12 [name] Hirschmann RN,BSN NCM [phone] PROVIDED W/[facility]HC AGENCY LIST AS RESOURCE.WOULD RECOMMEND PT/OT CONS.

## 2012-12-17 ENCOUNTER — Inpatient Hospital Stay (HOSPITAL_COMMUNITY): Payer: Medicare Other

## 2012-12-17 DIAGNOSIS — N1330 Unspecified hydronephrosis: Secondary | ICD-10-CM | POA: Diagnosis not present

## 2012-12-17 DIAGNOSIS — A419 Sepsis, unspecified organism: Secondary | ICD-10-CM | POA: Diagnosis not present

## 2012-12-17 DIAGNOSIS — N200 Calculus of kidney: Secondary | ICD-10-CM | POA: Diagnosis not present

## 2012-12-17 DIAGNOSIS — R509 Fever, unspecified: Secondary | ICD-10-CM | POA: Diagnosis not present

## 2012-12-17 DIAGNOSIS — N201 Calculus of ureter: Secondary | ICD-10-CM | POA: Diagnosis not present

## 2012-12-17 DIAGNOSIS — G20 Parkinson's disease: Secondary | ICD-10-CM | POA: Diagnosis not present

## 2012-12-17 DIAGNOSIS — N10 Acute tubulo-interstitial nephritis: Secondary | ICD-10-CM | POA: Diagnosis not present

## 2012-12-17 DIAGNOSIS — R109 Unspecified abdominal pain: Secondary | ICD-10-CM | POA: Diagnosis not present

## 2012-12-17 LAB — BASIC METABOLIC PANEL
BUN: 13 mg/dL (ref 6–23)
CO2: 25 mEq/L (ref 19–32)
Chloride: 103 mEq/L (ref 96–112)
Creatinine, Ser: 0.96 mg/dL (ref 0.50–1.35)
GFR calc Af Amer: >90 mL/min (ref >90)
Potassium: 3.9 mEq/L (ref 3.5–5.1)
Sodium: 135 mEq/L (ref 135–145)

## 2012-12-17 LAB — CBC
MCV: 92.6 fL (ref 78.0–100.0)
Platelets: 369 10*3/uL (ref 150–400)
RBC: 3.5 MIL/uL — ABNORMAL LOW (ref 4.22–5.81)
RDW: 13.4 % (ref 11.5–15.5)
WBC: 11.9 10*3/uL — ABNORMAL HIGH (ref 4.0–10.5)

## 2012-12-17 MED ORDER — BISACODYL 10 MG RE SUPP
10.0000 mg | Freq: Once | RECTAL | Status: AC
  Administered 2012-12-17: 10 mg via RECTAL
  Filled 2012-12-17: qty 1

## 2012-12-17 MED ORDER — SIMETHICONE 40 MG/0.6ML PO SUSP
40.0000 mg | Freq: Four times a day (QID) | ORAL | Status: DC | PRN
  Administered 2012-12-17 – 2012-12-18 (×3): 40 mg via ORAL
  Filled 2012-12-17 (×3): qty 0.6

## 2012-12-17 NOTE — Progress Notes (Signed)
No change from am assessment, nephrostomy draining well, no c/o

## 2012-12-17 NOTE — Progress Notes (Signed)
[facility] PROGRESS NOTE  Zeplin Aleshire ZOX:[id number] DOB: 10-02-1945 DOA: 12/15/2012 PCP: Tawny Hopping, PA-C  Assessment/Plan: Sepsis  -due to urinary source  -Present at the time of admission  -Blood cultures have been obtained--negative to date -12/15/2012 urine culture--prelim=proteus -12/16/12 urine culture-pending  -Empiric vancomycin and Zosyn pending culture data  -Continue IV fluids  -Obtain chest x-ray--left basilar atelectasis  Pyelonephritis/nephrolithiasis/L-hydronephrosis  -?struvite stone from proteus (12/15/12 urine culture) -CT abd/pelvis--numerous L-ureteral stones with L-UVJ stone with concerned for infected L-renal cyst  -appreciate urology  -Foley catheter was changed in the emergency department  -Left nephrostomy placed 12/16/12 -Antibiotics as discussed above  Parkinson's disease  -His DBS has not been turned on yet  -surgical sites look clean  -Continue home medications  Hyponatremia  -improved with hydration -Likely volume depletion  -saline lockIV normal saline  Constipation -Continue Colace and Senokot -bisacodyl added today Family Communication: wife at beside  Disposition Plan: Home when medically stable      Procedures/Studies: Ct Head Wo Contrast  12/15/2012   CLINICAL DATA:  Fever, fatigue.  EXAM: CT HEAD WITHOUT CONTRAST  TECHNIQUE: Contiguous axial images were obtained from the base of the skull through the vertex without intravenous contrast.  COMPARISON:  None.  FINDINGS: Stimulator lead is seen entering the left frontal skull with tip in the brain parenchyma chest inferior to the left thalamus and above the cerebral peduncle. No mass effect or midline shift is noted. Ventricular size is within normal limits. There is no evidence of mass lesion, hemorrhage or acute infarction. Bony calvarium is intact except for previously described postoperative changes.  IMPRESSION: Stimulator lead seen entering left frontal skull with lead in  left-sided brain parenchyma as described above. No acute intracranial abnormality seen.   Electronically Signed   By: Roque Lias M.D.   On: 12/15/2012 13:56   Ct Abdomen Pelvis W Contrast  12/15/2012   CLINICAL DATA:  Abdominal pain with fever and leukocytosis. Persistent urinary tract infection. Nephrolithiasis.  EXAM: CT ABDOMEN AND PELVIS WITH CONTRAST  TECHNIQUE: Multidetector CT imaging of the abdomen and pelvis was performed using the standard protocol following bolus administration of intravenous contrast.  CONTRAST:  OMNIPAQUE IOHEXOL 300 MG/ML  SOLN  COMPARISON:  CT scan dated 09/25/2010 and abdominal radiographs dated 01/05/2011  FINDINGS: There is a small left pleural effusion with adjacent atelectasis in the left lower lobe. Slight pleural thickening posteriorly at the right base. Heart size is normal.  The patient has chronic left hydronephrosis with thickening of the mucosa renal pelvis and infundibular a and left ureter consistent with the history of persistent urinary tract infection. The patient has numerous prominent stones in the distal left ureter over a 2.4 cm distance as well as a 4 mm stone at the left ureteral vesical junction. There are 2 tiny stones in the posterior aspect of the right side of the bladder near the right ureteral vesicle junction. Right renal collecting system is not dilated. Two small stones in the lower pole the right kidney.  There are no left renal stones. There is an irregular thick-walled cyst in the lateral aspect of the left kidney, 3.3 cm. The possibility of an infected cyst should be considered. There is an 8 mm cyst in the posterior aspect of the midportion of the kidney. There is a 9 mm cyst in the anterior aspect of the mid right kidney.  Foley catheter in the bladder.  The liver, spleen, pancreas, and adrenal glands are normal. Single 7 mm stone  in the gallbladder. The gallbladder is not distended. The stone is slightly larger than on the prior  exam. No significant osseous abnormality.  The bowel appears normal including the terminal ileum and appendix.  IMPRESSION: 1. 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 2. Thickening of the mucosa of the left renal collecting system consistent with chronic infection. Thick-walled cyst in the lateral aspect of the left kidney may be infected as well. 3. Tiny stones in the posterior aspect of the bladder. 4. Small left pleural effusion with left base atelectasis. 5. Solitary gallstone.   Electronically Signed   By: Geanie Cooley M.D.   On: 12/15/2012 20:00   Ir Perc Nephrostomy Left  12/16/2012   CLINICAL DATA:  Distal left ureteral obstruction by calculus and urosepsis with hydronephrosis of the left kidney.  EXAM: 1. ULTRASOUND GUIDANCE FOR PUNCTURE OF THE LEFT RENAL COLLECTING SYSTEM. 2. LEFT PERCUTANEOUS NEPHROSTOMY TUBE PLACEMENT.  MEDICATIONS: The patient is on IV Zosyn and vancomycin and additional antibiotics were not given prior to the procedure.  ANESTHESIA/SEDATION: 4.0 mg IV Versed; 200 mcg IV Fentanyl.  Total Moderate Sedation Time  21 minutes  CONTRAST:  Ten ml Omnipaque 300  COMPARISON:  None.  FLUOROSCOPY TIME:  48 seconds.  PROCEDURE: The procedure, risks, benefits, and alternatives were explained to the patient. Questions regarding the procedure were encouraged and answered. The patient understands and consents to the procedure.  The left flank region was prepped with Betadine in a sterile fashion, and a sterile drape was applied covering the operative field. A sterile gown and sterile gloves were used for the procedure. Local anesthesia was provided with 1% Lidocaine.  Ultrasound was used to localize the left kidney. Under direct ultrasound guidance, a 21 gauge needle was advanced into the renal collecting system. Ultrasound image documentation was  performed. Aspiration of urine sample was performed followed by contrast injection. A sample of urine was sent for culture analysis.  A transitional dilator was advanced over a guidewire. Percutaneous tract dilatation was then performed over the guidewire. A 10 -French percutaneous nephrostomy tube was then advanced and formed in the collecting system. Catheter position was confirmed by fluoroscopy after contrast injection.  The catheter was secured at the skin with a Prolene retention suture and Stat-Lock device. A gravity bag was placed.  COMPLICATIONS: None.  FINDINGS: Ultrasound confirms significant hydronephrosis. Aspirated urine was clear in appearance. A 10 French nephrostomy tube was formed at the level of the renal pelvis. Fluoroscopy overlying the pelvis demonstrates stable positioning of a column of calculi in the distal left ureter.  IMPRESSION: Placement of percutaneous left nephrostomy tube. A 10 French catheter was placed and attached to a gravity drainage bag. A sample of clear appearing urine was sent for culture analysis.   Electronically Signed   By: Irish Lack M.D.   On: 12/16/2012 17:15   Ir US Guide Bx Asp/drain  12/16/2012   CLINICAL DATA:  Distal left ureteral obstruction by calculus and urosepsis with hydronephrosis of the left kidney.  EXAM: 1. ULTRASOUND GUIDANCE FOR PUNCTURE OF THE LEFT RENAL COLLECTING SYSTEM. 2. LEFT PERCUTANEOUS NEPHROSTOMY TUBE PLACEMENT.  MEDICATIONS: The patient is on IV Zosyn and vancomycin and additional antibiotics were not given prior to the procedure.  ANESTHESIA/SEDATION: 4.0 mg IV Versed; 200 mcg IV Fentanyl.  Total Moderate Sedation Time  21 minutes  CONTRAST:  Ten ml Omnipaque 300  COMPARISON:  None.  FLUOROSCOPY TIME:  48 seconds.  PROCEDURE: The procedure, risks, benefits, and alternatives were explained to the patient. Questions regarding the procedure were encouraged and answered. The patient understands and consents to the procedure.  The left  flank region was prepped with Betadine in a sterile fashion, and a sterile drape was applied covering the operative field. A sterile gown and sterile gloves were used for the procedure. Local anesthesia was provided with 1% Lidocaine.  Ultrasound was used to localize the left kidney. Under direct ultrasound guidance, a 21 gauge needle was advanced into the renal collecting system. Ultrasound image documentation was performed. Aspiration of urine sample was performed followed by contrast injection. A sample of urine was sent for culture analysis.  A transitional dilator was advanced over a guidewire. Percutaneous tract dilatation was then performed over the guidewire. A 10 -French percutaneous nephrostomy tube was then advanced and formed in the collecting system. Catheter position was confirmed by fluoroscopy after contrast injection.  The catheter was secured at the skin with a Prolene retention suture and Stat-Lock device. A gravity bag was placed.  COMPLICATIONS: None.  FINDINGS: Ultrasound confirms significant hydronephrosis. Aspirated urine was clear in appearance. A 10 French nephrostomy tube was formed at the level of the renal pelvis. Fluoroscopy overlying the pelvis demonstrates stable positioning of a column of calculi in the distal left ureter.  IMPRESSION: Placement of percutaneous left nephrostomy tube. A 10 French catheter was placed and attached to a gravity drainage bag. A sample of clear appearing urine was sent for culture analysis.   Electronically Signed   By: Irish Lack M.D.   On: 12/16/2012 17:15   Dg Chest Port 1 View  12/15/2012   CLINICAL DATA:  Fever, sirs  EXAM: PORTABLE CHEST - 1 VIEW  COMPARISON:  May 22, 2012.  FINDINGS: The lungs are adequately inflated. There are coarse lung markings at the left lung base which are more conspicuous than in the past. The cardiac silhouette is normal in size. There perihilar lung markings are not as conspicuous today. There is an electrode  stimulating device generator present over the left pectoral region with electrodes extending off the film into the left neck. The bony structures exhibit no acute abnormality.  IMPRESSION: 1. There is likely atelectasis at the left lung base. A follow-up PA and lateral chest x-ray would be of value. 2. There is no evidence of CHF   Electronically Signed   By: Mee Macdonnell  [place]   On: 12/15/2012 17:19         Subjective: Patient is feeling better. He denies any fevers, chills, chest discomfort, shortness breath, nausea, vomiting, diarrhea. He complains of constipation. Denies any headache or visual disturbance.  Objective: Filed Vitals:   12/16/12 2251 12/17/12 0225 12/17/12 0607 12/17/12 1000  BP:  115/60 119/63 122/60  Pulse:  80 82 88  Temp: 102.2 F (39 C) 98.8 F (37.1 C) 98.9 F (37.2 C) 98.3 F (36.8 C)  TempSrc: Rectal Rectal Oral Axillary  Resp:  20 20 20   Height:      Weight:      SpO2:  95% 95% 94%    Intake/Output Summary (Last 24 hours) at 12/17/12 1128 Last data filed at 12/17/12 0607  Gross per 24 hour  Intake    640 ml  Output   1645 ml  Net  -1005 ml   Weight change:  Exam:   General:  Pt is alert, follows commands  appropriately, not in acute distress  HEENT: No icterus, No thrush,  [place]/AT  Cardiovascular: RRR, S1/S2, no rubs, no gallops  Respiratory: Left basilar crackles. No wheezing. Good air movement.  Abdomen: Soft/+BS, non tender, non distended, no guarding  Extremities: No edema, No lymphangitis, No petechiae, No rashes, no synovitis  Data Reviewed: Basic Metabolic Panel:  Recent Labs Lab 12/15/12 1040 12/16/12 0507 12/17/12 0500  NA 134* 135 135  K 4.1 4.1 3.9  CL 99 100 103  CO2 27 27 25   GLUCOSE 118* 119* 99  BUN 16 15 13   CREATININE 0.86 0.95 0.96  CALCIUM 9.3 8.8 8.5   Liver Function Tests:  Recent Labs Lab 12/15/12 1657 12/16/12 0507  AST 8 11  ALT <5 <5  ALKPHOS 70 65  BILITOT 0.5 0.5  PROT 7.1 6.2  ALBUMIN 3.2*  2.6*   No results found for this basename: LIPASE, AMYLASE,  in the last 168 hours No results found for this basename: AMMONIA,  in the last 168 hours CBC:  Recent Labs Lab 12/15/12 1040 12/16/12 0507 12/17/12 0500  WBC 18.9* 14.9* 11.9*  NEUTROABS 16.6*  --   --   HGB 13.3 12.0* 10.4*  HCT 40.7 36.6* 32.4*  MCV 91.3 92.0 92.6  PLT 456* 420* 369   Cardiac Enzymes: No results found for this basename: CKTOTAL, CKMB, CKMBINDEX, TROPONINI,  in the last 168 hours BNP: No components found with this basename: POCBNP,  CBG: No results found for this basename: GLUCAP,  in the last 168 hours  Recent Results (from the past 240 hour(s))  URINE CULTURE     Status: None   Collection Time    12/15/12 12:01 PM      Result Value Range Status   Specimen Description URINE, CATHETERIZED   Final   Special Requests NONE   Final   Culture  Setup Time     Final   Value: 12/15/2012 18:08     Performed at [facility]   Culture     Final   Value: >=100,000 COLONIES/mL GRAM NEGATIVE RODS     >=100,000 COLONIES/mL PROTEUS MIRABILIS     Performed at [facility]   Report Status PENDING   Incomplete  CULTURE, BLOOD (ROUTINE X 2)     Status: None   Collection Time    12/15/12  2:14 PM      Result Value Range Status   Specimen Description BLOOD  L HAND   Final   Special Requests NONE BOTTLES DRAWN AEROBIC AND ANAEROBIC 5CC   Final   Culture  Setup Time     Final   Value: 12/15/2012 23:26     Performed at [facility]   Culture     Final   Value:        BLOOD CULTURE RECEIVED NO GROWTH TO DATE CULTURE WILL BE HELD FOR 5 DAYS BEFORE ISSUING A FINAL NEGATIVE REPORT     Performed at [facility]   Report Status PENDING   Incomplete  CULTURE, BLOOD (ROUTINE X 2)     Status: None   Collection Time    12/15/12  2:21 PM      Result Value Range Status   Specimen Description BLOOD RIGHT ANTECUBITAL   Final   Special Requests NONE BOTTLES DRAWN AEROBIC AND ANAEROBIC [facility]    Final   Culture  Setup Time     Final   Value: 12/15/2012 23:26     Performed at [facility]   Culture     Final   Value:        BLOOD CULTURE RECEIVED NO GROWTH TO DATE CULTURE WILL BE HELD FOR 5 DAYS BEFORE ISSUING A FINAL NEGATIVE REPORT     Performed at [facility]   Report Status PENDING   Incomplete     Scheduled Meds: . alfuzosin  10 mg Oral QHS  . bisacodyl  10 mg Rectal Once  . carbidopa-levodopa  2 tablet Oral 5 X Daily  . clonazePAM  1 mg Oral QHS  . docusate sodium  100 mg Oral BID  . enoxaparin (LOVENOX) injection  40 mg Subcutaneous Q24H  . piperacillin-tazobactam (ZOSYN)  IV  3.375 g Intravenous Q8H  . rotigotine  1 patch Transdermal Daily  . senna  1 tablet Oral QHS  . sertraline  25 mg Oral Daily  . sodium chloride  3 mL Intravenous Q12H  . vancomycin  1,250 mg Intravenous Q12H   Continuous Infusions: . sodium chloride 100 mL/hr at 12/17/12 1049     Whittaker Lenis, DO  [facility] Pager [phone]  If 7PM-7AM, please contact night-coverage [email or web address] Password [email or web address] 12/17/2012, 11:28 AM   LOS: 2 days

## 2012-12-17 NOTE — Progress Notes (Signed)
Subjective: Patient reports that his abdomen feels a bit bloated. He is having no abdominal pain. He is passing some gas.  Objective: Vital signs in last 24 hours: Temp:  [97.9 F (36.6 C)-102.2 F (39 C)] 98.8 F (37.1 C) (11/15 0225) Pulse Rate:  [75-104] 80 (11/15 0225) Resp:  [10-23] 20 (11/15 0225) BP: (103-124)/(54-76) 115/60 mmHg (11/15 0225) SpO2:  [94 %-100 %] 95 % (11/15 0225)  Intake/Output from previous day: 11/14 0701 - 11/15 0700 In: 880 [P.O.:480; I.V.:400] Out: 1070 [Urine:1070] Intake/Output this shift: Total I/O In: 400 [I.V.:400] Out: 420 [Urine:420]  Physical Exam:  Constitutional: Vital signs reviewed. WD WN in NAD   Eyes: PERRL, No scleral icterus.   Cardiovascular: RRR Pulmonary/Chest: Normal effort Abdominal: Soft. Non-tender, somewhat distended, bowel sounds are normal, no masses, organomegaly, or guarding present.   Nephrostomy tube drainage is clear.  Lab Results:  Recent Labs  12/15/12 1040 12/16/12 0507  HGB 13.3 12.0*  HCT 40.7 36.6*   BMET  Recent Labs  12/15/12 1040 12/16/12 0507  NA 134* 135  K 4.1 4.1  CL 99 100  CO2 27 27  GLUCOSE 118* 119*  BUN 16 15  CREATININE 0.86 0.95  CALCIUM 9.3 8.8    Recent Labs  12/16/12 1147  INR 1.11   No results found for this basename: LABURIN,  in the last 72 hours Results for orders placed during the hospital encounter of 12/15/12  URINE CULTURE     Status: None   Collection Time    12/15/12 12:01 PM      Result Value Range Status   Specimen Description URINE, CATHETERIZED   Final   Special Requests NONE   Final   Culture  Setup Time     Final   Value: 12/15/2012 18:08     Performed at [facility]   Culture     Final   Value: Culture reincubated for better growth     Performed at [facility]   Report Status PENDING   Incomplete  CULTURE, BLOOD (ROUTINE X 2)     Status: None   Collection Time    12/15/12  2:14 PM      Result Value Range Status    Specimen Description BLOOD  L HAND   Final   Special Requests NONE BOTTLES DRAWN AEROBIC AND ANAEROBIC 5CC   Final   Culture  Setup Time     Final   Value: 12/15/2012 23:26     Performed at [facility]   Culture     Final   Value:        BLOOD CULTURE RECEIVED NO GROWTH TO DATE CULTURE WILL BE HELD FOR 5 DAYS BEFORE ISSUING A FINAL NEGATIVE REPORT     Performed at [facility]   Report Status PENDING   Incomplete  CULTURE, BLOOD (ROUTINE X 2)     Status: None   Collection Time    12/15/12  2:21 PM      Result Value Range Status   Specimen Description BLOOD RIGHT ANTECUBITAL   Final   Special Requests NONE BOTTLES DRAWN AEROBIC AND ANAEROBIC [facility]   Final   Culture  Setup Time     Final   Value: 12/15/2012 23:26     Performed at [facility]   Culture     Final   Value:        BLOOD CULTURE RECEIVED NO GROWTH TO DATE CULTURE WILL BE HELD FOR 5 DAYS  BEFORE ISSUING A FINAL NEGATIVE REPORT     Performed at [facility]   Report Status PENDING   Incomplete    Studies/Results: Ct Head Wo Contrast  12/15/2012   CLINICAL DATA:  Fever, fatigue.  EXAM: CT HEAD WITHOUT CONTRAST  TECHNIQUE: Contiguous axial images were obtained from the base of the skull through the vertex without intravenous contrast.  COMPARISON:  None.  FINDINGS: Stimulator lead is seen entering the left frontal skull with tip in the brain parenchyma chest inferior to the left thalamus and above the cerebral peduncle. No mass effect or midline shift is noted. Ventricular size is within normal limits. There is no evidence of mass lesion, hemorrhage or acute infarction. Bony calvarium is intact except for previously described postoperative changes.  IMPRESSION: Stimulator lead seen entering left frontal skull with lead in left-sided brain parenchyma as described above. No acute intracranial abnormality seen.   Electronically Signed   By: Roque Lias M.D.   On: 12/15/2012 13:56   Ct Abdomen Pelvis W  Contrast  12/15/2012   CLINICAL DATA:  Abdominal pain with fever and leukocytosis. Persistent urinary tract infection. Nephrolithiasis.  EXAM: CT ABDOMEN AND PELVIS WITH CONTRAST  TECHNIQUE: Multidetector CT imaging of the abdomen and pelvis was performed using the standard protocol following bolus administration of intravenous contrast.  CONTRAST:  OMNIPAQUE IOHEXOL 300 MG/ML  SOLN  COMPARISON:  CT scan dated 09/25/2010 and abdominal radiographs dated 01/05/2011  FINDINGS: There is a small left pleural effusion with adjacent atelectasis in the left lower lobe. Slight pleural thickening posteriorly at the right base. Heart size is normal.  The patient has chronic left hydronephrosis with thickening of the mucosa renal pelvis and infundibular a and left ureter consistent with the history of persistent urinary tract infection. The patient has numerous prominent stones in the distal left ureter over a 2.4 cm distance as well as a 4 mm stone at the left ureteral vesical junction. There are 2 tiny stones in the posterior aspect of the right side of the bladder near the right ureteral vesicle junction. Right renal collecting system is not dilated. Two small stones in the lower pole the right kidney.  There are no left renal stones. There is an irregular thick-walled cyst in the lateral aspect of the left kidney, 3.3 cm. The possibility of an infected cyst should be considered. There is an 8 mm cyst in the posterior aspect of the midportion of the kidney. There is a 9 mm cyst in the anterior aspect of the mid right kidney.  Foley catheter in the bladder.  The liver, spleen, pancreas, and adrenal glands are normal. Single 7 mm stone in the gallbladder. The gallbladder is not distended. The stone is slightly larger than on the prior exam. No significant osseous abnormality.  The bowel appears normal including the terminal ileum and appendix.  IMPRESSION: 1. 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 2. Thickening of the mucosa of the left renal collecting system consistent with chronic infection. Thick-walled cyst in the lateral aspect of the left kidney may be infected as well. 3. Tiny stones in the posterior aspect of the bladder. 4. Small left pleural effusion with left base atelectasis. 5. Solitary gallstone.   Electronically Signed   By: Geanie Cooley M.D.   On: 12/15/2012 20:00   Ir Perc Nephrostomy Left  12/16/2012   CLINICAL DATA:  Distal left ureteral obstruction by calculus and urosepsis with hydronephrosis of the left kidney.  EXAM: 1. ULTRASOUND GUIDANCE FOR PUNCTURE OF THE LEFT RENAL COLLECTING SYSTEM. 2. LEFT PERCUTANEOUS NEPHROSTOMY TUBE PLACEMENT.  MEDICATIONS: The patient is on IV Zosyn and vancomycin and additional antibiotics were not given prior to the procedure.  ANESTHESIA/SEDATION: 4.0 mg IV Versed; 200 mcg IV Fentanyl.  Total Moderate Sedation Time  21 minutes  CONTRAST:  Ten ml Omnipaque 300  COMPARISON:  None.  FLUOROSCOPY TIME:  48 seconds.  PROCEDURE: The procedure, risks, benefits, and alternatives were explained to the patient. Questions regarding the procedure were encouraged and answered. The patient understands and consents to the procedure.  The left flank region was prepped with Betadine in a sterile fashion, and a sterile drape was applied covering the operative field. A sterile gown and sterile gloves were used for the procedure. Local anesthesia was provided with 1% Lidocaine.  Ultrasound was used to localize the left kidney. Under direct ultrasound guidance, a 21 gauge needle was advanced into the renal collecting system. Ultrasound image documentation was performed. Aspiration of urine sample was performed followed by contrast injection. A sample of urine was sent for culture analysis.  A transitional dilator was advanced over a guidewire.  Percutaneous tract dilatation was then performed over the guidewire. A 10 -French percutaneous nephrostomy tube was then advanced and formed in the collecting system. Catheter position was confirmed by fluoroscopy after contrast injection.  The catheter was secured at the skin with a Prolene retention suture and Stat-Lock device. A gravity bag was placed.  COMPLICATIONS: None.  FINDINGS: Ultrasound confirms significant hydronephrosis. Aspirated urine was clear in appearance. A 10 French nephrostomy tube was formed at the level of the renal pelvis. Fluoroscopy overlying the pelvis demonstrates stable positioning of a column of calculi in the distal left ureter.  IMPRESSION: Placement of percutaneous left nephrostomy tube. A 10 French catheter was placed and attached to a gravity drainage bag. A sample of clear appearing urine was sent for culture analysis.   Electronically Signed   By: Irish Lack M.D.   On: 12/16/2012 17:15   Ir US Guide Bx Asp/drain  12/16/2012   CLINICAL DATA:  Distal left ureteral obstruction by calculus and urosepsis with hydronephrosis of the left kidney.  EXAM: 1. ULTRASOUND GUIDANCE FOR PUNCTURE OF THE LEFT RENAL COLLECTING SYSTEM. 2. LEFT PERCUTANEOUS NEPHROSTOMY TUBE PLACEMENT.  MEDICATIONS: The patient is on IV Zosyn and vancomycin and additional antibiotics were not given prior to the procedure.  ANESTHESIA/SEDATION: 4.0 mg IV Versed; 200 mcg IV Fentanyl.  Total Moderate Sedation Time  21 minutes  CONTRAST:  Ten ml Omnipaque 300  COMPARISON:  None.  FLUOROSCOPY TIME:  48 seconds.  PROCEDURE: The procedure, risks, benefits, and alternatives were explained to the patient. Questions regarding the procedure were encouraged and answered. The patient understands and consents to the procedure.  The left flank region was prepped with Betadine in a sterile fashion, and a sterile drape was applied covering the operative field. A sterile gown and sterile gloves were used for the procedure.  Local anesthesia was provided with 1% Lidocaine.  Ultrasound was used to localize the left kidney. Under direct ultrasound guidance, a 21 gauge needle was advanced into the renal collecting system. Ultrasound image documentation was performed. Aspiration of urine sample was performed followed by contrast injection. A sample of urine was sent for culture analysis.  A transitional dilator was advanced over a guidewire.  Percutaneous tract dilatation was then performed over the guidewire. A 10 -French percutaneous nephrostomy tube was then advanced and formed in the collecting system. Catheter position was confirmed by fluoroscopy after contrast injection.  The catheter was secured at the skin with a Prolene retention suture and Stat-Lock device. A gravity bag was placed.  COMPLICATIONS: None.  FINDINGS: Ultrasound confirms significant hydronephrosis. Aspirated urine was clear in appearance. A 10 French nephrostomy tube was formed at the level of the renal pelvis. Fluoroscopy overlying the pelvis demonstrates stable positioning of a column of calculi in the distal left ureter.  IMPRESSION: Placement of percutaneous left nephrostomy tube. A 10 French catheter was placed and attached to a gravity drainage bag. A sample of clear appearing urine was sent for culture analysis.   Electronically Signed   By: Irish Lack M.D.   On: 12/16/2012 17:15   Dg Chest Port 1 View  12/15/2012   CLINICAL DATA:  Fever, sirs  EXAM: PORTABLE CHEST - 1 VIEW  COMPARISON:  May 22, 2012.  FINDINGS: The lungs are adequately inflated. There are coarse lung markings at the left lung base which are more conspicuous than in the past. The cardiac silhouette is normal in size. There perihilar lung markings are not as conspicuous today. There is an electrode stimulating device generator present over the left pectoral region with electrodes extending off the film into the left neck. The bony structures exhibit no acute abnormality.  IMPRESSION:  1. There is likely atelectasis at the left lung base. A follow-up PA and lateral chest x-ray would be of value. 2. There is no evidence of CHF   Electronically Signed   By: [name]  [place]   On: 12/15/2012 17:19    Assessment/Plan:   Status post left nephrostomy tube drain placement for obstructing, infected left distal ureteral stones. He seems to be doing well. Urine cultures are still pending.    I think it okay to decrease his IV fluid. I ordered Dulcolax suppository.   LOS: 2 days   Marcine Matar M 12/17/2012, 6:02 AM

## 2012-12-18 DIAGNOSIS — N200 Calculus of kidney: Secondary | ICD-10-CM | POA: Diagnosis not present

## 2012-12-18 DIAGNOSIS — N201 Calculus of ureter: Secondary | ICD-10-CM | POA: Diagnosis not present

## 2012-12-18 DIAGNOSIS — N390 Urinary tract infection, site not specified: Secondary | ICD-10-CM | POA: Diagnosis not present

## 2012-12-18 DIAGNOSIS — J9819 Other pulmonary collapse: Secondary | ICD-10-CM | POA: Diagnosis not present

## 2012-12-18 DIAGNOSIS — N10 Acute tubulo-interstitial nephritis: Secondary | ICD-10-CM | POA: Diagnosis not present

## 2012-12-18 DIAGNOSIS — N1330 Unspecified hydronephrosis: Secondary | ICD-10-CM | POA: Diagnosis not present

## 2012-12-18 DIAGNOSIS — G20 Parkinson's disease: Secondary | ICD-10-CM | POA: Diagnosis not present

## 2012-12-18 DIAGNOSIS — E871 Hypo-osmolality and hyponatremia: Secondary | ICD-10-CM | POA: Diagnosis not present

## 2012-12-18 DIAGNOSIS — A419 Sepsis, unspecified organism: Secondary | ICD-10-CM | POA: Diagnosis not present

## 2012-12-18 LAB — CBC
HCT: 35.3 % — ABNORMAL LOW (ref 39.0–52.0)
Hemoglobin: 11.5 g/dL — ABNORMAL LOW (ref 13.0–17.0)
MCHC: 32.6 g/dL (ref 30.0–36.0)
MCV: 91.5 fL (ref 78.0–100.0)
WBC: 8.7 10*3/uL (ref 4.0–10.5)

## 2012-12-18 LAB — BASIC METABOLIC PANEL
BUN: 9 mg/dL (ref 6–23)
CO2: 26 mEq/L (ref 19–32)
Calcium: 8.9 mg/dL (ref 8.4–10.5)
Chloride: 100 mEq/L (ref 96–112)
Creatinine, Ser: 0.88 mg/dL (ref 0.50–1.35)
GFR calc Af Amer: >90 mL/min (ref >90)
Potassium: 3.8 mEq/L (ref 3.5–5.1)

## 2012-12-18 LAB — VANCOMYCIN, TROUGH: Vancomycin Tr: 14.9 ug/mL (ref 10.0–20.0)

## 2012-12-18 LAB — URINE CULTURE: Culture: >=100000

## 2012-12-18 MED ORDER — POLYVINYL ALCOHOL 1.4 % OP SOLN
1.0000 [drp] | OPHTHALMIC | Status: DC | PRN
  Administered 2012-12-18: 1 [drp] via OPHTHALMIC
  Filled 2012-12-18: qty 15

## 2012-12-18 NOTE — Progress Notes (Signed)
Subjective: Patient reports that he feels well. No fever or chills. No flank pain.  Objective: Vital signs in last 24 hours: Temp:  [97.7 F (36.5 C)-99 F (37.2 C)] 98.2 F (36.8 C) (11/16 0501) Pulse Rate:  [81-88] 81 (11/16 0501) Resp:  [18-20] 18 (11/16 0501) BP: (121-140)/(60-77) 129/73 mmHg (11/16 0501) SpO2:  [94 %-98 %] 95 % (11/16 0501)  Intake/Output from previous day: 11/15 0701 - 11/16 0700 In: 685 [P.O.:380; IV Piggyback:300] Out: 2550 [Urine:2550] Intake/Output this shift:    Physical Exam:  Constitutional: Vital signs reviewed. WD WN in NAD   Eyes: PERRL, No scleral icterus.    Nephrostomy tube drainage is clear.  Lab Results:  Recent Labs  12/16/12 0507 12/17/12 0500 12/18/12 0452  HGB 12.0* 10.4* 11.5*  HCT 36.6* 32.4* 35.3*   BMET  Recent Labs  12/17/12 0500 12/18/12 0452  NA 135 135  K 3.9 3.8  CL 103 100  CO2 25 26  GLUCOSE 99 90  BUN 13 9  CREATININE 0.96 0.88  CALCIUM 8.5 8.9    Recent Labs  12/16/12 1147  INR 1.11   No results found for this basename: LABURIN,  in the last 72 hours Results for orders placed during the hospital encounter of 12/15/12  URINE CULTURE     Status: None   Collection Time    12/15/12 12:01 PM      Result Value Range Status   Specimen Description URINE, CATHETERIZED   Final   Special Requests NONE   Final   Culture  Setup Time     Final   Value: 12/15/2012 18:08     Performed at [facility]   Culture     Final   Value: >=100,000 COLONIES/mL GRAM NEGATIVE RODS     >=100,000 COLONIES/mL PROTEUS MIRABILIS     Performed at [facility]   Report Status PENDING   Incomplete  CULTURE, BLOOD (ROUTINE X 2)     Status: None   Collection Time    12/15/12  2:14 PM      Result Value Range Status   Specimen Description BLOOD  L HAND   Final   Special Requests NONE BOTTLES DRAWN AEROBIC AND ANAEROBIC 5CC   Final   Culture  Setup Time     Final   Value: 12/15/2012 23:26     Performed  at [facility]   Culture     Final   Value:        BLOOD CULTURE RECEIVED NO GROWTH TO DATE CULTURE WILL BE HELD FOR 5 DAYS BEFORE ISSUING A FINAL NEGATIVE REPORT     Performed at [facility]   Report Status PENDING   Incomplete  CULTURE, BLOOD (ROUTINE X 2)     Status: None   Collection Time    12/15/12  2:21 PM      Result Value Range Status   Specimen Description BLOOD RIGHT ANTECUBITAL   Final   Special Requests NONE BOTTLES DRAWN AEROBIC AND ANAEROBIC [facility]   Final   Culture  Setup Time     Final   Value: 12/15/2012 23:26     Performed at [facility]   Culture     Final   Value:        BLOOD CULTURE RECEIVED NO GROWTH TO DATE CULTURE WILL BE HELD FOR 5 DAYS BEFORE ISSUING A FINAL NEGATIVE REPORT     Performed at [facility]   Report Status PENDING  Incomplete  URINE CULTURE     Status: None   Collection Time    12/16/12  4:32 PM      Result Value Range Status   Specimen Description KIDNEY LEFT   Final   Special Requests NONE   Final   Culture  Setup Time     Final   Value: 12/16/2012 22:46     Performed at [facility]   Culture     Final   Value: Culture reincubated for better growth     Performed at [facility]   Report Status PENDING   Incomplete    Studies/Results: Ir Perc Nephrostomy Left  12/16/2012   CLINICAL DATA:  Distal left ureteral obstruction by calculus and urosepsis with hydronephrosis of the left kidney.  EXAM: 1. ULTRASOUND GUIDANCE FOR PUNCTURE OF THE LEFT RENAL COLLECTING SYSTEM. 2. LEFT PERCUTANEOUS NEPHROSTOMY TUBE PLACEMENT.  MEDICATIONS: The patient is on IV Zosyn and vancomycin and additional antibiotics were not given prior to the procedure.  ANESTHESIA/SEDATION: 4.0 mg IV Versed; 200 mcg IV Fentanyl.  Total Moderate Sedation Time  21 minutes  CONTRAST:  Ten ml Omnipaque 300  COMPARISON:  None.  FLUOROSCOPY TIME:  48 seconds.  PROCEDURE: The procedure, risks, benefits, and alternatives were explained  to the patient. Questions regarding the procedure were encouraged and answered. The patient understands and consents to the procedure.  The left flank region was prepped with Betadine in a sterile fashion, and a sterile drape was applied covering the operative field. A sterile gown and sterile gloves were used for the procedure. Local anesthesia was provided with 1% Lidocaine.  Ultrasound was used to localize the left kidney. Under direct ultrasound guidance, a 21 gauge needle was advanced into the renal collecting system. Ultrasound image documentation was performed. Aspiration of urine sample was performed followed by contrast injection. A sample of urine was sent for culture analysis.  A transitional dilator was advanced over a guidewire. Percutaneous tract dilatation was then performed over the guidewire. A 10 -French percutaneous nephrostomy tube was then advanced and formed in the collecting system. Catheter position was confirmed by fluoroscopy after contrast injection.  The catheter was secured at the skin with a Prolene retention suture and Stat-Lock device. A gravity bag was placed.  COMPLICATIONS: None.  FINDINGS: Ultrasound confirms significant hydronephrosis. Aspirated urine was clear in appearance. A 10 French nephrostomy tube was formed at the level of the renal pelvis. Fluoroscopy overlying the pelvis demonstrates stable positioning of a column of calculi in the distal left ureter.  IMPRESSION: Placement of percutaneous left nephrostomy tube. A 10 French catheter was placed and attached to a gravity drainage bag. A sample of clear appearing urine was sent for culture analysis.   Electronically Signed   By: Irish Lack M.D.   On: 12/16/2012 17:15   Ir US Guide Bx Asp/drain  12/16/2012   CLINICAL DATA:  Distal left ureteral obstruction by calculus and urosepsis with hydronephrosis of the left kidney.  EXAM: 1. ULTRASOUND GUIDANCE FOR PUNCTURE OF THE LEFT RENAL COLLECTING SYSTEM. 2. LEFT  PERCUTANEOUS NEPHROSTOMY TUBE PLACEMENT.  MEDICATIONS: The patient is on IV Zosyn and vancomycin and additional antibiotics were not given prior to the procedure.  ANESTHESIA/SEDATION: 4.0 mg IV Versed; 200 mcg IV Fentanyl.  Total Moderate Sedation Time  21 minutes  CONTRAST:  Ten ml Omnipaque 300  COMPARISON:  None.  FLUOROSCOPY TIME:  48 seconds.  PROCEDURE: The procedure, risks, benefits, and alternatives were explained to the patient.  Questions regarding the procedure were encouraged and answered. The patient understands and consents to the procedure.  The left flank region was prepped with Betadine in a sterile fashion, and a sterile drape was applied covering the operative field. A sterile gown and sterile gloves were used for the procedure. Local anesthesia was provided with 1% Lidocaine.  Ultrasound was used to localize the left kidney. Under direct ultrasound guidance, a 21 gauge needle was advanced into the renal collecting system. Ultrasound image documentation was performed. Aspiration of urine sample was performed followed by contrast injection. A sample of urine was sent for culture analysis.  A transitional dilator was advanced over a guidewire. Percutaneous tract dilatation was then performed over the guidewire. A 10 -French percutaneous nephrostomy tube was then advanced and formed in the collecting system. Catheter position was confirmed by fluoroscopy after contrast injection.  The catheter was secured at the skin with a Prolene retention suture and Stat-Lock device. A gravity bag was placed.  COMPLICATIONS: None.  FINDINGS: Ultrasound confirms significant hydronephrosis. Aspirated urine was clear in appearance. A 10 French nephrostomy tube was formed at the level of the renal pelvis. Fluoroscopy overlying the pelvis demonstrates stable positioning of a column of calculi in the distal left ureter.  IMPRESSION: Placement of percutaneous left nephrostomy tube. A 10 French catheter was placed and  attached to a gravity drainage bag. A sample of clear appearing urine was sent for culture analysis.   Electronically Signed   By: Irish Lack M.D.   On: 12/16/2012 17:15   Dg Abd Portable 2v  12/17/2012   CLINICAL DATA:  Left lateral abdominal pain. A nephrostomy tube placed yesterday. Abdominal distention and bloating.  EXAM: PORTABLE ABDOMEN - 2 VIEW  COMPARISON:  Nephrostomy placement of 1 day prior  FINDINGS: Supine and right side up decubitus views. The supine view demonstrates contrast throughout the colon, normal in caliber. No small bowel distension. Nephrostomy tube which projects over the left side of the abdomen.  Right side up decubitus views demonstrate no free intraperitoneal air or significant air-fluid levels.  IMPRESSION: No acute findings.   Electronically Signed   By: Jeronimo Greaves M.D.   On: 12/17/2012 17:48    Assessment/Plan:   Status post left percutaneous nephrostomy tube placement for GU sepsis secondary to obstructing distal ureteral stones on the left. He seems to be doing well. Urine culture reveals Proteus.    From a urologic standpoint, I think he is fine to go home on appropriate antibiotics. I would expect in this community acquired UTI, routine gram-negative coverage would be fine. He will followup with Dr. Brunilda Payor.   LOS: 3 days   Marcine Matar M 12/18/2012, 8:07 AM

## 2012-12-18 NOTE — Progress Notes (Signed)
Subjective: Patient states he feels good. He denies any significant pain, N/V, fever or chills. He denies any blood in his nephrostomy tube or bleeding or leaking around tube.   Objective: Physical Exam: BP 129/73  Pulse 81  Temp(Src) 98.2 F (36.8 C) (Oral)  Resp 18  Ht 5\' 10"  (1.778 m)  Wt 190 lb 14.7 oz (86.6 kg)  BMI 27.39 kg/m2  SpO2 95%  Left nephrostomy tube dressing C/D/I, no signs of bleeding, hematoma or leaking. 24 hr output 550cc clear urine. Urine Cx proteus.   Labs: CBC  Recent Labs  12/17/12 0500 12/18/12 0452  WBC 11.9* 8.7  HGB 10.4* 11.5*  HCT 32.4* 35.3*  PLT 369 403*   BMET  Recent Labs  12/17/12 0500 12/18/12 0452  NA 135 135  K 3.9 3.8  CL 103 100  CO2 25 26  GLUCOSE 99 90  BUN 13 9  CREATININE 0.96 0.88  CALCIUM 8.5 8.9   LFT  Recent Labs  12/15/12 1657 12/16/12 0507  PROT 7.1 6.2  ALBUMIN 3.2* 2.6*  AST 8 11  ALT <5 <5  ALKPHOS 70 65  BILITOT 0.5 0.5  BILIDIR <0.1  --   IBILI NOT CALCULATED  --    PT/INR  Recent Labs  12/16/12 1147  LABPROT 14.1  INR 1.11     Studies/Results: Ir Perc Nephrostomy Left  12/16/2012   CLINICAL DATA:  Distal left ureteral obstruction by calculus and urosepsis with hydronephrosis of the left kidney.  EXAM: 1. ULTRASOUND GUIDANCE FOR PUNCTURE OF THE LEFT RENAL COLLECTING SYSTEM. 2. LEFT PERCUTANEOUS NEPHROSTOMY TUBE PLACEMENT.  MEDICATIONS: The patient is on IV Zosyn and vancomycin and additional antibiotics were not given prior to the procedure.  ANESTHESIA/SEDATION: 4.0 mg IV Versed; 200 mcg IV Fentanyl.  Total Moderate Sedation Time  21 minutes  CONTRAST:  Ten ml Omnipaque 300  COMPARISON:  None.  FLUOROSCOPY TIME:  48 seconds.  PROCEDURE: The procedure, risks, benefits, and alternatives were explained to the patient. Questions regarding the procedure were encouraged and answered. The patient understands and consents to the procedure.  The left flank region was prepped with Betadine in a  sterile fashion, and a sterile drape was applied covering the operative field. A sterile gown and sterile gloves were used for the procedure. Local anesthesia was provided with 1% Lidocaine.  Ultrasound was used to localize the left kidney. Under direct ultrasound guidance, a 21 gauge needle was advanced into the renal collecting system. Ultrasound image documentation was performed. Aspiration of urine sample was performed followed by contrast injection. A sample of urine was sent for culture analysis.  A transitional dilator was advanced over a guidewire. Percutaneous tract dilatation was then performed over the guidewire. A 10 -French percutaneous nephrostomy tube was then advanced and formed in the collecting system. Catheter position was confirmed by fluoroscopy after contrast injection.  The catheter was secured at the skin with a Prolene retention suture and Stat-Lock device. A gravity bag was placed.  COMPLICATIONS: None.  FINDINGS: Ultrasound confirms significant hydronephrosis. Aspirated urine was clear in appearance. A 10 French nephrostomy tube was formed at the level of the renal pelvis. Fluoroscopy overlying the pelvis demonstrates stable positioning of a column of calculi in the distal left ureter.  IMPRESSION: Placement of percutaneous left nephrostomy tube. A 10 French catheter was placed and attached to a gravity drainage bag. A sample of clear appearing urine was sent for culture analysis.   Electronically Signed   By: Irish Lack  M.D.   On: 12/16/2012 17:15   Ir US Guide Bx Asp/drain  12/16/2012   CLINICAL DATA:  Distal left ureteral obstruction by calculus and urosepsis with hydronephrosis of the left kidney.  EXAM: 1. ULTRASOUND GUIDANCE FOR PUNCTURE OF THE LEFT RENAL COLLECTING SYSTEM. 2. LEFT PERCUTANEOUS NEPHROSTOMY TUBE PLACEMENT.  MEDICATIONS: The patient is on IV Zosyn and vancomycin and additional antibiotics were not given prior to the procedure.  ANESTHESIA/SEDATION: 4.0 mg IV  Versed; 200 mcg IV Fentanyl.  Total Moderate Sedation Time  21 minutes  CONTRAST:  Ten ml Omnipaque 300  COMPARISON:  None.  FLUOROSCOPY TIME:  48 seconds.  PROCEDURE: The procedure, risks, benefits, and alternatives were explained to the patient. Questions regarding the procedure were encouraged and answered. The patient understands and consents to the procedure.  The left flank region was prepped with Betadine in a sterile fashion, and a sterile drape was applied covering the operative field. A sterile gown and sterile gloves were used for the procedure. Local anesthesia was provided with 1% Lidocaine.  Ultrasound was used to localize the left kidney. Under direct ultrasound guidance, a 21 gauge needle was advanced into the renal collecting system. Ultrasound image documentation was performed. Aspiration of urine sample was performed followed by contrast injection. A sample of urine was sent for culture analysis.  A transitional dilator was advanced over a guidewire. Percutaneous tract dilatation was then performed over the guidewire. A 10 -French percutaneous nephrostomy tube was then advanced and formed in the collecting system. Catheter position was confirmed by fluoroscopy after contrast injection.  The catheter was secured at the skin with a Prolene retention suture and Stat-Lock device. A gravity bag was placed.  COMPLICATIONS: None.  FINDINGS: Ultrasound confirms significant hydronephrosis. Aspirated urine was clear in appearance. A 10 French nephrostomy tube was formed at the level of the renal pelvis. Fluoroscopy overlying the pelvis demonstrates stable positioning of a column of calculi in the distal left ureter.  IMPRESSION: Placement of percutaneous left nephrostomy tube. A 10 French catheter was placed and attached to a gravity drainage bag. A sample of clear appearing urine was sent for culture analysis.   Electronically Signed   By: Irish Lack M.D.   On: 12/16/2012 17:15   Dg Abd Portable  2v  12/17/2012   CLINICAL DATA:  Left lateral abdominal pain. A nephrostomy tube placed yesterday. Abdominal distention and bloating.  EXAM: PORTABLE ABDOMEN - 2 VIEW  COMPARISON:  Nephrostomy placement of 1 day prior  FINDINGS: Supine and right side up decubitus views. The supine view demonstrates contrast throughout the colon, normal in caliber. No small bowel distension. Nephrostomy tube which projects over the left side of the abdomen.  Right side up decubitus views demonstrate no free intraperitoneal air or significant air-fluid levels.  IMPRESSION: No acute findings.   Electronically Signed   By: Jeronimo Greaves M.D.   On: 12/17/2012 17:48    Assessment/Plan: Pyelonephritis with early urosepsis with left sided hydronephrosis secondary to ureteral stones. S/p Left 50F PCN placement 12/16/12, continue to flush and monitor output.  All plans per urology.    LOS: 3 days    Cloretta Ned 12/18/2012 10:43 AM

## 2012-12-18 NOTE — Progress Notes (Signed)
ANTIBIOTIC CONSULT NOTE - follow up  Pharmacy Consult for vancomycin Indication: rule out sepsis  Allergies  Allergen Reactions  . Flomax [Tamsulosin Hcl] Other (See Comments)    Achy joints    Patient Measurements: Height: 5\' 10"  (177.8 cm) Weight: 190 lb 14.7 oz (86.6 kg) IBW/kg (Calculated) : 73  Vital Signs: Temp: 98.2 F (36.8 C) (11/16 0501) Temp src: Oral (11/16 0501) BP: 129/73 mmHg (11/16 0501) Pulse Rate: 81 (11/16 0501) Intake/Output from previous day: 11/15 0701 - 11/16 0700 In: 685 [P.O.:380; IV Piggyback:300] Out: 2550 [Urine:2550] Intake/Output from this shift:    Labs:  Recent Labs  12/16/12 0507 12/17/12 0500 12/18/12 0452  WBC 14.9* 11.9* 8.7  HGB 12.0* 10.4* 11.5*  PLT 420* 369 403*  CREATININE 0.95 0.96 0.88   Estimated Creatinine Clearance: 83 ml/min (by C-G formula based on Cr of 0.88).  Recent Labs  12/18/12 0452  VANCOTROUGH 14.9     Microbiology: Recent Results (from the past 720 hour(s))  URINE CULTURE     Status: None   Collection Time    12/15/12 12:01 PM      Result Value Range Status   Specimen Description URINE, CATHETERIZED   Final   Special Requests NONE   Final   Culture  Setup Time     Final   Value: 12/15/2012 18:08     Performed at [facility]   Culture     Final   Value: >=100,000 COLONIES/mL GRAM NEGATIVE RODS     >=100,000 COLONIES/mL PROTEUS MIRABILIS     Performed at [facility]   Report Status PENDING   Incomplete  CULTURE, BLOOD (ROUTINE X 2)     Status: None   Collection Time    12/15/12  2:14 PM      Result Value Range Status   Specimen Description BLOOD  L HAND   Final   Special Requests NONE BOTTLES DRAWN AEROBIC AND ANAEROBIC 5CC   Final   Culture  Setup Time     Final   Value: 12/15/2012 23:26     Performed at [facility]   Culture     Final   Value:        BLOOD CULTURE RECEIVED NO GROWTH TO DATE CULTURE WILL BE HELD FOR 5 DAYS BEFORE ISSUING A FINAL NEGATIVE REPORT      Performed at [facility]   Report Status PENDING   Incomplete  CULTURE, BLOOD (ROUTINE X 2)     Status: None   Collection Time    12/15/12  2:21 PM      Result Value Range Status   Specimen Description BLOOD RIGHT ANTECUBITAL   Final   Special Requests NONE BOTTLES DRAWN AEROBIC AND ANAEROBIC [facility]   Final   Culture  Setup Time     Final   Value: 12/15/2012 23:26     Performed at [facility]   Culture     Final   Value:        BLOOD CULTURE RECEIVED NO GROWTH TO DATE CULTURE WILL BE HELD FOR 5 DAYS BEFORE ISSUING A FINAL NEGATIVE REPORT     Performed at [facility]   Report Status PENDING   Incomplete  URINE CULTURE     Status: None   Collection Time    12/16/12  4:32 PM      Result Value Range Status   Specimen Description KIDNEY LEFT   Final   Special Requests NONE  Final   Culture  Setup Time     Final   Value: 12/16/2012 22:46     Performed at [facility]   Culture     Final   Value: Culture reincubated for better growth     Performed at [facility]   Report Status PENDING   Incomplete    Medical History: Past Medical History  Diagnosis Date  . Pneumonia     35 yr ago  . Neuromuscular disorder     Parkinsons disease  . Chronic kidney disease     BPH    Medications:  Scheduled:  . alfuzosin  10 mg Oral QHS  . carbidopa-levodopa  2 tablet Oral 5 X Daily  . clonazePAM  1 mg Oral QHS  . docusate sodium  100 mg Oral BID  . enoxaparin (LOVENOX) injection  40 mg Subcutaneous Q24H  . piperacillin-tazobactam (ZOSYN)  IV  3.375 g Intravenous Q8H  . rotigotine  1 patch Transdermal Daily  . senna  1 tablet Oral QHS  . sertraline  25 mg Oral Daily  . sodium chloride  3 mL Intravenous Q12H  . vancomycin  1,250 mg Intravenous Q12H   Infusions:     Assessment: 68 yo male presented to ER with fever, chills with hx of parkinson's. Patient is s/p surgery for implantation of deep brain stimulator in Micro on 11/30/12.  Patient was discharged home with indwelling catheter s/p that surgery. Per urologist on 11/10, patient has nephrolithiasis. Patient with temp of 104 in ER and source of infection is likely urinary per notes to start broad spectrum abx's vanc per pharmacy dosing and Zosyn per Md dosing  Day #3 Vancomycin/Zosyn  Urine culture growing proteus awaiting sensitivity results  Stable labs and vitals  Goal of Therapy:  Vancomycin trough level 15-20 mcg/ml  Plan:  1) Continue vancomycin 1250mg  IV q12 2) Anticipate d/c of vanc soon   Hessie Knows, PharmD, BCPS Pager [phone] 12/18/2012 7:38 AM

## 2012-12-18 NOTE — Progress Notes (Signed)
[facility] PROGRESS NOTE  [name] [name] ZOX:[id number] DOB: [birth year] DOA: 12/15/2012 PCP: Tawny Hopping, PA-C   Assessment/Plan: Sepsis  -due to urinary source  -Present at the time of admission  -Blood cultures have been obtained--negative to date  -12/15/2012 urine culture--proteus + serratia -12/16/12 urine culture-pending  -Continue Zosyn pending culture data  -d/c vancomycin -Continue IV fluids  -Obtain chest x-ray--left basilar atelectasis  Pyelonephritis/nephrolithiasis/L-hydronephrosis  -?struvite stone from proteus (12/15/12 urine culture)  -CT abd/pelvis--numerous L-ureteral stones with L-UVJ stone with concerned for infected L-renal cyst  -appreciate urology  -Foley catheter was changed in the emergency department  -Left nephrostomy placed 12/16/12  -Antibiotics as discussed above  Parkinson's disease  -His DBS has not been turned on yet  -surgical sites look clean  -Continue home medications  Hyponatremia  -improved with hydration  -Likely volume depletion  -saline lockIV normal saline  Constipation  -resolved-->now with loose stools -d/c Colace and Senokot  -bisacodyl added today  Family Communication: wife at beside  Disposition Plan: Home 12/19/12 if stable          Procedures/Studies: Ct Head Wo Contrast  12/15/2012   CLINICAL DATA:  Fever, fatigue.  EXAM: CT HEAD WITHOUT CONTRAST  TECHNIQUE: Contiguous axial images were obtained from the base of the skull through the vertex without intravenous contrast.  COMPARISON:  None.  FINDINGS: Stimulator lead is seen entering the left frontal skull with tip in the brain parenchyma chest inferior to the left thalamus and above the cerebral peduncle. No mass effect or midline shift is noted. Ventricular size is within normal limits. There is no evidence of mass lesion, hemorrhage or acute infarction. Bony calvarium is intact except for previously described postoperative changes.  IMPRESSION: Stimulator  lead seen entering left frontal skull with lead in left-sided brain parenchyma as described above. No acute intracranial abnormality seen.   Electronically Signed   By: Roque Lias M.D.   On: 12/15/2012 13:56   Ct Abdomen Pelvis W Contrast  12/15/2012   CLINICAL DATA:  Abdominal pain with fever and leukocytosis. Persistent urinary tract infection. Nephrolithiasis.  EXAM: CT ABDOMEN AND PELVIS WITH CONTRAST  TECHNIQUE: Multidetector CT imaging of the abdomen and pelvis was performed using the standard protocol following bolus administration of intravenous contrast.  CONTRAST:  OMNIPAQUE IOHEXOL 300 MG/ML  SOLN  COMPARISON:  CT scan dated 09/25/2010 and abdominal radiographs dated 01/05/2011  FINDINGS: There is a small left pleural effusion with adjacent atelectasis in the left lower lobe. Slight pleural thickening posteriorly at the right base. Heart size is normal.  The patient has chronic left hydronephrosis with thickening of the mucosa renal pelvis and infundibular a and left ureter consistent with the history of persistent urinary tract infection. The patient has numerous prominent stones in the distal left ureter over a 2.4 cm distance as well as a 4 mm stone at the left ureteral vesical junction. There are 2 tiny stones in the posterior aspect of the right side of the bladder near the right ureteral vesicle junction. Right renal collecting system is not dilated. Two small stones in the lower pole the right kidney.  There are no left renal stones. There is an irregular thick-walled cyst in the lateral aspect of the left kidney, 3.3 cm. The possibility of an infected cyst should be considered. There is an 8 mm cyst in the posterior aspect of the midportion of the kidney. There is a 9 mm cyst in the anterior aspect of the mid right kidney.  Foley  catheter in the bladder.  The liver, spleen, pancreas, and adrenal glands are normal. Single 7 mm stone in the gallbladder. The gallbladder is not distended.  The stone is slightly larger than on the prior exam. No significant osseous abnormality.  The bowel appears normal including the terminal ileum and appendix.  IMPRESSION: 1. 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 2. Thickening of the mucosa of the left renal collecting system consistent with chronic infection. Thick-walled cyst in the lateral aspect of the left kidney may be infected as well. 3. Tiny stones in the posterior aspect of the bladder. 4. Small left pleural effusion with left base atelectasis. 5. Solitary gallstone.   Electronically Signed   By: Geanie Cooley M.D.   On: 12/15/2012 20:00   Ir Perc Nephrostomy Left  12/16/2012   CLINICAL DATA:  Distal left ureteral obstruction by calculus and urosepsis with hydronephrosis of the left kidney.  EXAM: 1. ULTRASOUND GUIDANCE FOR PUNCTURE OF THE LEFT RENAL COLLECTING SYSTEM. 2. LEFT PERCUTANEOUS NEPHROSTOMY TUBE PLACEMENT.  MEDICATIONS: The patient is on IV Zosyn and vancomycin and additional antibiotics were not given prior to the procedure.  ANESTHESIA/SEDATION: 4.0 mg IV Versed; 200 mcg IV Fentanyl.  Total Moderate Sedation Time  21 minutes  CONTRAST:  Ten ml Omnipaque 300  COMPARISON:  None.  FLUOROSCOPY TIME:  48 seconds.  PROCEDURE: The procedure, risks, benefits, and alternatives were explained to the patient. Questions regarding the procedure were encouraged and answered. The patient understands and consents to the procedure.  The left flank region was prepped with Betadine in a sterile fashion, and a sterile drape was applied covering the operative field. A sterile gown and sterile gloves were used for the procedure. Local anesthesia was provided with 1% Lidocaine.  Ultrasound was used to localize the left kidney. Under direct ultrasound guidance, a 21 gauge needle was advanced into the renal collecting  system. Ultrasound image documentation was performed. Aspiration of urine sample was performed followed by contrast injection. A sample of urine was sent for culture analysis.  A transitional dilator was advanced over a guidewire. Percutaneous tract dilatation was then performed over the guidewire. A 10 -French percutaneous nephrostomy tube was then advanced and formed in the collecting system. Catheter position was confirmed by fluoroscopy after contrast injection.  The catheter was secured at the skin with a Prolene retention suture and Stat-Lock device. A gravity bag was placed.  COMPLICATIONS: None.  FINDINGS: Ultrasound confirms significant hydronephrosis. Aspirated urine was clear in appearance. A 10 French nephrostomy tube was formed at the level of the renal pelvis. Fluoroscopy overlying the pelvis demonstrates stable positioning of a column of calculi in the distal left ureter.  IMPRESSION: Placement of percutaneous left nephrostomy tube. A 10 French catheter was placed and attached to a gravity drainage bag. A sample of clear appearing urine was sent for culture analysis.   Electronically Signed   By: Irish Lack M.D.   On: 12/16/2012 17:15   Ir US Guide Bx Asp/drain  12/16/2012   CLINICAL DATA:  Distal left ureteral obstruction by calculus and urosepsis with hydronephrosis of the left kidney.  EXAM: 1. ULTRASOUND GUIDANCE FOR PUNCTURE OF THE LEFT RENAL COLLECTING SYSTEM. 2. LEFT PERCUTANEOUS NEPHROSTOMY TUBE PLACEMENT.  MEDICATIONS: The patient is on IV Zosyn and vancomycin and additional antibiotics were not given prior to the procedure.  ANESTHESIA/SEDATION: 4.0  mg IV Versed; 200 mcg IV Fentanyl.  Total Moderate Sedation Time  21 minutes  CONTRAST:  Ten ml Omnipaque 300  COMPARISON:  None.  FLUOROSCOPY TIME:  48 seconds.  PROCEDURE: The procedure, risks, benefits, and alternatives were explained to the patient. Questions regarding the procedure were encouraged and answered. The patient  understands and consents to the procedure.  The left flank region was prepped with Betadine in a sterile fashion, and a sterile drape was applied covering the operative field. A sterile gown and sterile gloves were used for the procedure. Local anesthesia was provided with 1% Lidocaine.  Ultrasound was used to localize the left kidney. Under direct ultrasound guidance, a 21 gauge needle was advanced into the renal collecting system. Ultrasound image documentation was performed. Aspiration of urine sample was performed followed by contrast injection. A sample of urine was sent for culture analysis.  A transitional dilator was advanced over a guidewire. Percutaneous tract dilatation was then performed over the guidewire. A 10 -French percutaneous nephrostomy tube was then advanced and formed in the collecting system. Catheter position was confirmed by fluoroscopy after contrast injection.  The catheter was secured at the skin with a Prolene retention suture and Stat-Lock device. A gravity bag was placed.  COMPLICATIONS: None.  FINDINGS: Ultrasound confirms significant hydronephrosis. Aspirated urine was clear in appearance. A 10 French nephrostomy tube was formed at the level of the renal pelvis. Fluoroscopy overlying the pelvis demonstrates stable positioning of a column of calculi in the distal left ureter.  IMPRESSION: Placement of percutaneous left nephrostomy tube. A 10 French catheter was placed and attached to a gravity drainage bag. A sample of clear appearing urine was sent for culture analysis.   Electronically Signed   By: Irish Lack M.D.   On: 12/16/2012 17:15   Dg Chest Port 1 View  12/15/2012   CLINICAL DATA:  Fever, sirs  EXAM: PORTABLE CHEST - 1 VIEW  COMPARISON:  May 22, 2012.  FINDINGS: The lungs are adequately inflated. There are coarse lung markings at the left lung base which are more conspicuous than in the past. The cardiac silhouette is normal in size. There perihilar lung markings  are not as conspicuous today. There is an electrode stimulating device generator present over the left pectoral region with electrodes extending off the film into the left neck. The bony structures exhibit no acute abnormality.  IMPRESSION: 1. There is likely atelectasis at the left lung base. A follow-up PA and lateral chest x-ray would be of value. 2. There is no evidence of CHF   Electronically Signed   By: Dayanara Sherrill  [place]   On: 12/15/2012 17:19   Dg Abd Portable 2v  12/17/2012   CLINICAL DATA:  Left lateral abdominal pain. A nephrostomy tube placed yesterday. Abdominal distention and bloating.  EXAM: PORTABLE ABDOMEN - 2 VIEW  COMPARISON:  Nephrostomy placement of 1 day prior  FINDINGS: Supine and right side up decubitus views. The supine view demonstrates contrast throughout the colon, normal in caliber. No small bowel distension. Nephrostomy tube which projects over the left side of the abdomen.  Right side up decubitus views demonstrate no free intraperitoneal air or significant air-fluid levels.  IMPRESSION: No acute findings.   Electronically Signed   By: Jeronimo Greaves M.D.   On: 12/17/2012 17:48         Subjective: Patient denies fevers, chills, chest discomfort, shortness of breath, nausea, vomiting, abdominal pain. He had some loose stools today. Denies any dysuria or hematuria.  Denies headaches or visual disturbance.  Objective: Filed Vitals:   12/17/12 1000 12/17/12 1440 12/17/12 2043 12/18/12 0501  BP: 122/60 121/66 140/77 129/73  Pulse: 88 88 88 81  Temp: 98.3 F (36.8 C) 99 F (37.2 C) 97.7 F (36.5 C) 98.2 F (36.8 C)  TempSrc: Axillary Axillary Oral Oral  Resp: 20 20 19 18   Height:      Weight:      SpO2: 94% 96% 98% 95%    Intake/Output Summary (Last 24 hours) at 12/18/12 1503 Last data filed at 12/18/12 0916  Gross per 24 hour  Intake    865 ml  Output   1650 ml  Net   -785 ml   Weight change:  Exam:   General:  Pt is alert, follows commands appropriately,  not in acute distress  HEENT: No icterus, No thrush,  /AT  Cardiovascular: RRR, S1/S2, no rubs, no gallops  Respiratory: CTA bilaterally, no wheezing, no crackles, no rhonchi  Abdomen: Soft/+BS, mild LUQ tender , non distended, no guarding  Extremities: No edema, No lymphangitis, No petechiae, No rashes, no synovitis  Data Reviewed: Basic Metabolic Panel:  Recent Labs Lab 12/15/12 1040 12/16/12 0507 12/17/12 0500 12/18/12 0452  NA 134* 135 135 135  K 4.1 4.1 3.9 3.8  CL 99 100 103 100  CO2 27 27 25 26   GLUCOSE 118* 119* 99 90  BUN 16 15 13 9   CREATININE 0.86 0.95 0.96 0.88  CALCIUM 9.3 8.8 8.5 8.9   Liver Function Tests:  Recent Labs Lab 12/15/12 1657 12/16/12 0507  AST 8 11  ALT <5 <5  ALKPHOS 70 65  BILITOT 0.5 0.5  PROT 7.1 6.2  ALBUMIN 3.2* 2.6*   No results found for this basename: LIPASE, AMYLASE,  in the last 168 hours No results found for this basename: AMMONIA,  in the last 168 hours CBC:  Recent Labs Lab 12/15/12 1040 12/16/12 0507 12/17/12 0500 12/18/12 0452  WBC 18.9* 14.9* 11.9* 8.7  NEUTROABS 16.6*  --   --   --   HGB 13.3 12.0* 10.4* 11.5*  HCT 40.7 36.6* 32.4* 35.3*  MCV 91.3 92.0 92.6 91.5  PLT 456* 420* 369 403*   Cardiac Enzymes: No results found for this basename: CKTOTAL, CKMB, CKMBINDEX, TROPONINI,  in the last 168 hours BNP: No components found with this basename: POCBNP,  CBG: No results found for this basename: GLUCAP,  in the last 168 hours  Recent Results (from the past 240 hour(s))  URINE CULTURE     Status: None   Collection Time    12/15/12 12:01 PM      Result Value Range Status   Specimen Description URINE, CATHETERIZED   Final   Special Requests NONE   Final   Culture  Setup Time     Final   Value: 12/15/2012 18:08     Performed at [facility]   Culture     Final   Value: >=100,000 COLONIES/mL PROTEUS MIRABILIS     >=100,000 COLONIES/mL SERRATIA MARCESCENS     Performed at [facility]    Report Status 12/18/2012 FINAL   Final   Organism ID, Bacteria PROTEUS MIRABILIS   Final   Organism ID, Bacteria SERRATIA MARCESCENS   Final  CULTURE, BLOOD (ROUTINE X 2)     Status: None   Collection Time    12/15/12  2:14 PM      Result Value Range Status   Specimen Description BLOOD  L HAND  Final   Special Requests NONE BOTTLES DRAWN AEROBIC AND ANAEROBIC 5CC   Final   Culture  Setup Time     Final   Value: 12/15/2012 23:26     Performed at [facility]   Culture     Final   Value:        BLOOD CULTURE RECEIVED NO GROWTH TO DATE CULTURE WILL BE HELD FOR 5 DAYS BEFORE ISSUING A FINAL NEGATIVE REPORT     Performed at [facility]   Report Status PENDING   Incomplete  CULTURE, BLOOD (ROUTINE X 2)     Status: None   Collection Time    12/15/12  2:21 PM      Result Value Range Status   Specimen Description BLOOD RIGHT ANTECUBITAL   Final   Special Requests NONE BOTTLES DRAWN AEROBIC AND ANAEROBIC [facility]   Final   Culture  Setup Time     Final   Value: 12/15/2012 23:26     Performed at [facility]   Culture     Final   Value:        BLOOD CULTURE RECEIVED NO GROWTH TO DATE CULTURE WILL BE HELD FOR 5 DAYS BEFORE ISSUING A FINAL NEGATIVE REPORT     Performed at [facility]   Report Status PENDING   Incomplete  URINE CULTURE     Status: None   Collection Time    12/16/12  4:32 PM      Result Value Range Status   Specimen Description KIDNEY LEFT   Final   Special Requests NONE   Final   Culture  Setup Time     Final   Value: 12/16/2012 22:46     Performed at [facility]   Culture     Final   Value: Culture reincubated for better growth     Performed at [facility]   Report Status PENDING   Incomplete     Scheduled Meds: . alfuzosin  10 mg Oral QHS  . carbidopa-levodopa  2 tablet Oral 5 X Daily  . clonazePAM  1 mg Oral QHS  . docusate sodium  100 mg Oral BID  . enoxaparin (LOVENOX) injection  40 mg Subcutaneous Q24H  .  piperacillin-tazobactam (ZOSYN)  IV  3.375 g Intravenous Q8H  . rotigotine  1 patch Transdermal Daily  . senna  1 tablet Oral QHS  . sertraline  25 mg Oral Daily  . sodium chloride  3 mL Intravenous Q12H   Continuous Infusions:    Carlisia Geno, DO  [facility] Pager [phone]  If 7PM-7AM, please contact night-coverage [email or web address] Password [email or web address] 12/18/2012, 3:03 PM   LOS: 3 days

## 2012-12-18 NOTE — Evaluation (Signed)
Physical Therapy Evaluation Patient Details Name: [name] [name] MRN: [id number] DOB: [birth year] Today's Date: 12/18/2012 Time: 4403-4742 (and [phone]) PT Time Calculation (min): 9 min  PT Assessment / Plan / Recommendation History of Present Illness  67 year old gentleman with a history of Parkinson's disease, nephrolithiasis, and BPH presents with one-day history of fever, chills, malaise. The patient had surgery for implantation of a deep brain stimulator (DBS) in [place] on 11/29/2012. After surgery, the patient had some urinary retention. He was discharged home with an indwelling Foley catheter. He was doing fine until this morning when he felt dizzy with no energy. He felt dizzy when he got up out of bed. There was no syncope or falls. He took his temperature and it was 101.35F  Clinical Impression  Patient presents with generalized weakness and imbalance limiting independence and safety with mobility.  He will benefit from skilled PT in the acute setting to maximize independence and allow return home with HHPT and family assist.   PT Assessment  Patient needs continued PT services    Follow Up Recommendations  Home health PT    Does the patient have the potential to tolerate intense rehabilitation    N/A  Barriers to Discharge  None      Equipment Recommendations    None recommended by PT   Recommendations for Other Services   None  Frequency Min 3X/week    Precautions / Restrictions Precautions Precautions: Fall   Pertinent Vitals/Pain Denies pain       Mobility  Bed Mobility Bed Mobility: Not assessed Details for Bed Mobility Assistance: up in recliner Transfers Transfers: Sit to Stand;Stand to Sit Sit to Stand: From chair/3-in-1;With armrests;4: Min guard Stand to Sit: 4: Min guard;To chair/3-in-1;With armrests Details for Transfer Assistance: assist for stability; cues for hand placement for safety Ambulation/Gait Ambulation/Gait Assistance: 4: Min  guard Ambulation Distance (Feet): 260 Feet Assistive device: Rolling walker Ambulation/Gait Assistance Details: cues for eyes ahead and head up, increased speed with flexed posture, cues for safety/technique with turns Gait Pattern: Trunk flexed;Decreased stride length    Exercises  None   PT Diagnosis: Abnormality of gait  PT Problem List: Decreased balance;Decreased activity tolerance;Decreased knowledge of use of DME;Decreased mobility PT Treatment Interventions: DME instruction;Balance training;Gait training;Stair training;Functional mobility training;Patient/family education;Therapeutic activities;Therapeutic exercise     PT Goals(Current goals can be found in the care plan section) Acute Rehab PT Goals Patient Stated Goal: To return home PT Goal Formulation: With patient/family Time For Goal Achievement: 01/01/13 Potential to Achieve Goals: Good  Visit Information  Last PT Received On: 12/18/12 Assistance Needed: +1 History of Present Illness: 67 year old gentleman with a history of Parkinson's disease, nephrolithiasis, and BPH presents with one-day history of fever, chills, malaise. The patient had surgery for implantation of a deep brain stimulator (DBS) in [place] on 11/29/2012. After surgery, the patient had some urinary retention. He was discharged home with an indwelling Foley catheter. He was doing fine until this morning when he felt dizzy with no energy. He felt dizzy when he got up out of bed. There was no syncope or falls. He took his temperature and it was 101.35F       Prior Functioning  Home Living Family/patient expects to be discharged to:: Private residence Living Arrangements: Spouse/significant other Available Help at Discharge: Family Type of Home: House Home Access: Stairs to enter Secretary/administrator of Steps: 2 Entrance Stairs-Rails: Right Home Layout: One level Home Equipment: Wheelchair - manual;Cane - single point;Walker - 2  wheels Prior Function Level of Independence: Needs assistance Gait / Transfers Assistance Needed: supervision for community mobility, uses cane when out and has to stand a lot, otherwise was not using device prior to surgery ADL's / Homemaking Assistance Needed: assist for buttons on occasion and to tuck in shirt, does okay with velcro closure shoes Communication Communication: No difficulties    Cognition  Cognition Arousal/Alertness: Awake/alert Behavior During Therapy: WFL for tasks assessed/performed Overall Cognitive Status: Within Functional Limits for tasks assessed    Extremity/Trunk Assessment Upper Extremity Assessment Upper Extremity Assessment: RUE deficits/detail;LUE deficits/detail RUE Deficits / Details: strength and AROM WFL, tremor with pill rolling present LUE Deficits / Details: strength and AROM WFL, tremor with pill rolling present Lower Extremity Assessment Lower Extremity Assessment: RLE deficits/detail;LLE deficits/detail RLE Deficits / Details: strength and AROM WFL, decreased coordination with RAM LLE Deficits / Details: strength and AROM WFL, decreased coordination with RAM Cervical / Trunk Assessment Cervical / Trunk Assessment: Other exceptions Cervical / Trunk Exceptions: stiffness in trunk evident, stays flexed   Balance Balance Balance Assessed: Yes Static Standing Balance Static Standing - Balance Support: Bilateral upper extremity supported Static Standing - Level of Assistance: 5: Stand by assistance  End of Session PT - End of Session Equipment Utilized During Treatment: Gait belt Activity Tolerance: Patient tolerated treatment well Patient left: in chair;with call bell/phone within reach;with family/visitor present  GP     [facility] 12/18/2012, 2:08 PM Sheran Lawless, PT [phone] 12/18/2012

## 2012-12-19 DIAGNOSIS — G20 Parkinson's disease: Secondary | ICD-10-CM | POA: Diagnosis not present

## 2012-12-19 DIAGNOSIS — J9819 Other pulmonary collapse: Secondary | ICD-10-CM | POA: Diagnosis not present

## 2012-12-19 DIAGNOSIS — R509 Fever, unspecified: Secondary | ICD-10-CM | POA: Diagnosis not present

## 2012-12-19 DIAGNOSIS — A419 Sepsis, unspecified organism: Secondary | ICD-10-CM | POA: Diagnosis not present

## 2012-12-19 DIAGNOSIS — N10 Acute tubulo-interstitial nephritis: Secondary | ICD-10-CM | POA: Diagnosis not present

## 2012-12-19 DIAGNOSIS — N390 Urinary tract infection, site not specified: Secondary | ICD-10-CM | POA: Diagnosis not present

## 2012-12-19 DIAGNOSIS — E871 Hypo-osmolality and hyponatremia: Secondary | ICD-10-CM | POA: Diagnosis not present

## 2012-12-19 LAB — BASIC METABOLIC PANEL
BUN: 9 mg/dL (ref 6–23)
Chloride: 102 mEq/L (ref 96–112)
Creatinine, Ser: 0.82 mg/dL (ref 0.50–1.35)
GFR calc non Af Amer: 89 mL/min — ABNORMAL LOW (ref >90)
Glucose, Bld: 102 mg/dL — ABNORMAL HIGH (ref 70–99)
Potassium: 3.3 mEq/L — ABNORMAL LOW (ref 3.5–5.1)

## 2012-12-19 LAB — URINE CULTURE: Culture: NO GROWTH

## 2012-12-19 LAB — CBC
HCT: 35 % — ABNORMAL LOW (ref 39.0–52.0)
Hemoglobin: 11.5 g/dL — ABNORMAL LOW (ref 13.0–17.0)
MCH: 29.6 pg (ref 26.0–34.0)
MCHC: 32.9 g/dL (ref 30.0–36.0)
RDW: 13 % (ref 11.5–15.5)

## 2012-12-19 MED ORDER — CIPROFLOXACIN HCL 500 MG PO TABS
500.0000 mg | ORAL_TABLET | Freq: Two times a day (BID) | ORAL | Status: DC
  Administered 2012-12-19: 500 mg via ORAL
  Filled 2012-12-19 (×3): qty 1

## 2012-12-19 MED ORDER — CIPROFLOXACIN HCL 500 MG PO TABS: 500.0000 mg | ORAL_TABLET | Freq: Two times a day (BID) | ORAL | Status: DC

## 2012-12-19 MED ORDER — POTASSIUM CHLORIDE CRYS ER 20 MEQ PO TBCR
20.0000 meq | EXTENDED_RELEASE_TABLET | Freq: Once | ORAL | Status: AC
  Administered 2012-12-19: 20 meq via ORAL
  Filled 2012-12-19: qty 1

## 2012-12-19 MED ORDER — HYDROCODONE-ACETAMINOPHEN 5-325 MG PO TABS: 1.0000 | ORAL_TABLET | ORAL | Status: DC | PRN

## 2012-12-19 MED ORDER — CIPROFLOXACIN HCL 250 MG PO TABS: 250.0000 mg | ORAL_TABLET | Freq: Two times a day (BID) | ORAL | Status: DC

## 2012-12-19 MED ORDER — CIPROFLOXACIN HCL 250 MG PO TABS
250.0000 mg | ORAL_TABLET | Freq: Two times a day (BID) | ORAL | Status: DC
  Filled 2012-12-19 (×3): qty 1

## 2012-12-19 NOTE — Discharge Summary (Signed)
Physician Discharge Summary  [name] [name] JXB:[id number] DOB: 01-30-1946 DOA: 12/15/2012  PCP: Tawny Hopping, PA-C  Admit date: 12/15/2012 Discharge date: 12/19/2012  Recommendations for Outpatient Follow-up:  1. Pt will need to follow up with PCP in 2 weeks post discharge 2. Please obtain BMP to evaluate electrolytes and kidney function 3. Please also check CBC to evaluate Hg and Hct levels 4. Follow up with Dr. Brunilda Payor in 1-2 weeks  Discharge Diagnoses:  Active Problems:   Sepsis   Parkinson's disease   Acute pyelonephritis Sepsis  -due to urinary source  -Present at the time of admission  -Blood cultures have been obtained--negative to date  -12/15/2012 urine culture--proteus + serratia  -12/16/12 nephrostomy culture-neg  -Patient was initially on vancomycin and Zosyn--pt had over 4 days of IV abx -d/c vancomycin and Zosyn -Started ciprofloxacin 500 mg p twice a day for 14 additional days after susceptibility data returned -Continue IV fluids  -Obtain chest x-ray--left basilar atelectasis  Pyelonephritis/nephrolithiasis/L-hydronephrosis  -?struvite stone from proteus (12/15/12 urine culture)  -CT abd/pelvis--numerous L-ureteral stones with L-UVJ stone with concerned for infected L-renal cyst  -appreciate urology  -Foley catheter was changed in the emergency department  -Left nephrostomy placed 12/16/12 by IR -Antibiotics as discussed above  -Ciprofloxacin 500 mg po twice a day x14 additional dose  -Patient will be discharged home with a Foley catheter in place as well as his nephrostomy tube. He would need to followup with urology -The patient will followup with Dr. Brunilda Payor in the office  Parkinson's disease  -His DBS has not been turned on yet  -surgical sites look clean  -Continue home medications  -staple removed during the admission without complications Hyponatremia  -improved with hydration  -Likely volume depletion  -saline lockIV normal saline  Constipation   -resolved-->now with loose stools  -d/c Colace and Senokot  -bisacodyl d/c Deconditioning -Home health physical therapy as well as or and was set up to help patient with his nephrostomy tube management and physical therapy Family Communication: wife at beside  Disposition Plan: Home 12/19/12 if stable   Discharge Condition: Stable  Disposition:  Follow-up Information   Follow up with NESI,MARC-HENRY, MD.   Specialty:  Urology   Contact information:   [address] [place] [place] [place] [address] [phone]      discharge home  Diet: Regular Wt Readings from Last 3 Encounters:  12/15/12 86.6 kg (190 lb 14.7 oz)  01/05/11 90.379 kg (199 lb 4 oz)  01/05/11 90.379 kg (199 lb 4 oz)   HPI:  67 year old gentleman with a history of Parkinson's disease, nephrolithiasis, and BPH presents with one-day history of fever, chills, malaise. The patient had surgery for implantation of a deep brain stimulator (DBS) in [place] on 11/29/2012. After surgery, the patient had some urinary retention. He was discharged home with an indwelling Foley catheter. He was doing fine until this morning when he felt dizzy with no energy. He felt dizzy when he got up out of bed. There was no syncope or falls. He took his temperature and it was 101.59F. He was told to come to the emergency department. The patient denies any chest pain, shortness breath, coughing, hemoptysis, vomiting, diarrhea, headache, visual disturbance. The patient has some left upper quadrant pain. Apparently he went to see his urologist on 12/12/12, Dr. Brunilda Payor. He was told he had nephrolithiasis  per xrays at his office. He was given some pain meds and sent home. He states that his abdominal pain is somewhat better than earlier in the week. He denies any diarrhea, hematochezia, melena. He has not been placed on any antibiotics.  In emergency department, the patient was noted to have a temperature of  104.49F. The patient was hemodynamically stable. WBC was 18.9. Serum sodium was 134. Otherwise BMP was unremarkable. The patient was given a dose of Rocephin after blood cultures were obtained. Urinalysis showed TNTC WBC and RBCs. CT brain did not reveal any fluid collections or acute abnormalities.       Consultants: Urology--Dr. Brunilda Payor  Discharge Exam: Filed Vitals:   12/19/12 0604  BP: 116/63  Pulse: 82  Temp: 98.2 F (36.8 C)  Resp: 20   Filed Vitals:   12/18/12 0501 12/18/12 1742 12/18/12 2201 12/19/12 0604  BP: 129/73 111/62 122/65 116/63  Pulse: 81 71 78 82  Temp: 98.2 F (36.8 C) 98.2 F (36.8 C) 98.1 F (36.7 C) 98.2 F (36.8 C)  TempSrc: Oral Oral Oral Oral  Resp: 18 20 16 20   Height:      Weight:      SpO2: 95% 96% 97% 96%   General: A&O x 3, NAD, pleasant, cooperative Cardiovascular: RRR, no rub, no gallop, no S3 Respiratory: CTAB, no wheeze, no rhonchi Abdomen:soft, nontender, nondistended, positive bowel sounds Extremities: No edema, No lymphangitis, no petechiae  Discharge Instructions      Discharge Orders   Future Orders Complete By Expires   Diet - low sodium heart healthy  As directed    Increase activity slowly  As directed        Medication List    STOP taking these medications       ibuprofen 200 MG tablet  Commonly known as:  ADVIL,MOTRIN      TAKE these medications       acetaminophen 500 MG tablet  Commonly known as:  TYLENOL  Take 1,000 mg by mouth every 6 (six) hours as needed. For pain.     alfuzosin 10 MG 24 hr tablet  Commonly known as:  UROXATRAL  Take 10 mg by mouth at bedtime.     ALPRAZolam 0.25 MG tablet  Commonly known as:  XANAX  Take 0.25 mg by mouth at bedtime as needed for sleep.     AZO TABS PO  Take 2 tablets by mouth daily as needed (Urinary pain relief).     carbidopa-levodopa 25-100 MG per tablet  Commonly known as:  SINEMET IR  Take 2 tablets by mouth 5 (five) times daily.     ciprofloxacin 500  MG tablet  Commonly known as:  CIPRO  Take 1 tablet (500 mg total) by mouth 2 (two) times daily.     clonazePAM 1 MG tablet  Commonly known as:  KLONOPIN  Take 1 mg by mouth daily.     HYDROcodone-acetaminophen 5-325 MG per tablet  Commonly known as:  NORCO/VICODIN  Take 1-2 tablets by mouth every 4 (four) hours as needed for moderate pain.     rotigotine 2 MG/24HR  Commonly known as:  NEUPRO  Place 1 patch onto the skin daily.     sertraline 25 MG tablet  Commonly known as:  ZOLOFT  Take 25 mg by mouth daily.     triamcinolone cream 0.1 %  Commonly known as:  KENALOG  Apply 1 application topically daily as needed. For ezcema.  The results of significant diagnostics from this hospitalization (including imaging, microbiology, ancillary and laboratory) are listed below for reference.    Significant Diagnostic Studies: Ct Head Wo Contrast  12/15/2012   CLINICAL DATA:  Fever, fatigue.  EXAM: CT HEAD WITHOUT CONTRAST  TECHNIQUE: Contiguous axial images were obtained from the base of the skull through the vertex without intravenous contrast.  COMPARISON:  None.  FINDINGS: Stimulator lead is seen entering the left frontal skull with tip in the brain parenchyma chest inferior to the left thalamus and above the cerebral peduncle. No mass effect or midline shift is noted. Ventricular size is within normal limits. There is no evidence of mass lesion, hemorrhage or acute infarction. Bony calvarium is intact except for previously described postoperative changes.  IMPRESSION: Stimulator lead seen entering left frontal skull with lead in left-sided brain parenchyma as described above. No acute intracranial abnormality seen.   Electronically Signed   By: Roque Lias M.D.   On: 12/15/2012 13:56   Ct Abdomen Pelvis W Contrast  12/15/2012   CLINICAL DATA:  Abdominal pain with fever and leukocytosis. Persistent urinary tract infection. Nephrolithiasis.  EXAM: CT ABDOMEN AND PELVIS WITH  CONTRAST  TECHNIQUE: Multidetector CT imaging of the abdomen and pelvis was performed using the standard protocol following bolus administration of intravenous contrast.  CONTRAST:  OMNIPAQUE IOHEXOL 300 MG/ML  SOLN  COMPARISON:  CT scan dated 09/25/2010 and abdominal radiographs dated 01/05/2011  FINDINGS: There is a small left pleural effusion with adjacent atelectasis in the left lower lobe. Slight pleural thickening posteriorly at the right base. Heart size is normal.  The patient has chronic left hydronephrosis with thickening of the mucosa renal pelvis and infundibular a and left ureter consistent with the history of persistent urinary tract infection. The patient has numerous prominent stones in the distal left ureter over a 2.4 cm distance as well as a 4 mm stone at the left ureteral vesical junction. There are 2 tiny stones in the posterior aspect of the right side of the bladder near the right ureteral vesicle junction. Right renal collecting system is not dilated. Two small stones in the lower pole the right kidney.  There are no left renal stones. There is an irregular thick-walled cyst in the lateral aspect of the left kidney, 3.3 cm. The possibility of an infected cyst should be considered. There is an 8 mm cyst in the posterior aspect of the midportion of the kidney. There is a 9 mm cyst in the anterior aspect of the mid right kidney.  Foley catheter in the bladder.  The liver, spleen, pancreas, and adrenal glands are normal. Single 7 mm stone in the gallbladder. The gallbladder is not distended. The stone is slightly larger than on the prior exam. No significant osseous abnormality.  The bowel appears normal including the terminal ileum and appendix.  IMPRESSION: 1. 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 2. Thickening of the mucosa of the left renal  collecting system consistent with chronic infection. Thick-walled cyst in the lateral aspect of the left kidney may be infected as well. 3. Tiny stones in the posterior aspect of the bladder. 4. Small left pleural effusion with left base atelectasis. 5. Solitary gallstone.   Electronically Signed   By: Geanie Cooley M.D.   On: 12/15/2012 20:00   Ir Perc Nephrostomy Left  12/16/2012  CLINICAL DATA:  Distal left ureteral obstruction by calculus and urosepsis with hydronephrosis of the left kidney.  EXAM: 1. ULTRASOUND GUIDANCE FOR PUNCTURE OF THE LEFT RENAL COLLECTING SYSTEM. 2. LEFT PERCUTANEOUS NEPHROSTOMY TUBE PLACEMENT.  MEDICATIONS: The patient is on IV Zosyn and vancomycin and additional antibiotics were not given prior to the procedure.  ANESTHESIA/SEDATION: 4.0 mg IV Versed; 200 mcg IV Fentanyl.  Total Moderate Sedation Time  21 minutes  CONTRAST:  Ten ml Omnipaque 300  COMPARISON:  None.  FLUOROSCOPY TIME:  48 seconds.  PROCEDURE: The procedure, risks, benefits, and alternatives were explained to the patient. Questions regarding the procedure were encouraged and answered. The patient understands and consents to the procedure.  The left flank region was prepped with Betadine in a sterile fashion, and a sterile drape was applied covering the operative field. A sterile gown and sterile gloves were used for the procedure. Local anesthesia was provided with 1% Lidocaine.  Ultrasound was used to localize the left kidney. Under direct ultrasound guidance, a 21 gauge needle was advanced into the renal collecting system. Ultrasound image documentation was performed. Aspiration of urine sample was performed followed by contrast injection. A sample of urine was sent for culture analysis.  A transitional dilator was advanced over a guidewire. Percutaneous tract dilatation was then performed over the guidewire. A 10 -French percutaneous nephrostomy tube was then advanced and formed in the collecting system. Catheter  position was confirmed by fluoroscopy after contrast injection.  The catheter was secured at the skin with a Prolene retention suture and Stat-Lock device. A gravity bag was placed.  COMPLICATIONS: None.  FINDINGS: Ultrasound confirms significant hydronephrosis. Aspirated urine was clear in appearance. A 10 French nephrostomy tube was formed at the level of the renal pelvis. Fluoroscopy overlying the pelvis demonstrates stable positioning of a column of calculi in the distal left ureter.  IMPRESSION: Placement of percutaneous left nephrostomy tube. A 10 French catheter was placed and attached to a gravity drainage bag. A sample of clear appearing urine was sent for culture analysis.   Electronically Signed   By: Irish Lack M.D.   On: 12/16/2012 17:15   Ir US Guide Bx Asp/drain  12/16/2012   CLINICAL DATA:  Distal left ureteral obstruction by calculus and urosepsis with hydronephrosis of the left kidney.  EXAM: 1. ULTRASOUND GUIDANCE FOR PUNCTURE OF THE LEFT RENAL COLLECTING SYSTEM. 2. LEFT PERCUTANEOUS NEPHROSTOMY TUBE PLACEMENT.  MEDICATIONS: The patient is on IV Zosyn and vancomycin and additional antibiotics were not given prior to the procedure.  ANESTHESIA/SEDATION: 4.0 mg IV Versed; 200 mcg IV Fentanyl.  Total Moderate Sedation Time  21 minutes  CONTRAST:  Ten ml Omnipaque 300  COMPARISON:  None.  FLUOROSCOPY TIME:  48 seconds.  PROCEDURE: The procedure, risks, benefits, and alternatives were explained to the patient. Questions regarding the procedure were encouraged and answered. The patient understands and consents to the procedure.  The left flank region was prepped with Betadine in a sterile fashion, and a sterile drape was applied covering the operative field. A sterile gown and sterile gloves were used for the procedure. Local anesthesia was provided with 1% Lidocaine.  Ultrasound was used to localize the left kidney. Under direct ultrasound guidance, a 21 gauge needle was advanced into the  renal collecting system. Ultrasound image documentation was performed. Aspiration of urine sample was performed followed by contrast injection. A sample of urine was sent for culture analysis.  A transitional dilator was advanced over a guidewire. Percutaneous tract dilatation  was then performed over the guidewire. A 10 -French percutaneous nephrostomy tube was then advanced and formed in the collecting system. Catheter position was confirmed by fluoroscopy after contrast injection.  The catheter was secured at the skin with a Prolene retention suture and Stat-Lock device. A gravity bag was placed.  COMPLICATIONS: None.  FINDINGS: Ultrasound confirms significant hydronephrosis. Aspirated urine was clear in appearance. A 10 French nephrostomy tube was formed at the level of the renal pelvis. Fluoroscopy overlying the pelvis demonstrates stable positioning of a column of calculi in the distal left ureter.  IMPRESSION: Placement of percutaneous left nephrostomy tube. A 10 French catheter was placed and attached to a gravity drainage bag. A sample of clear appearing urine was sent for culture analysis.   Electronically Signed   By: Irish Lack M.D.   On: 12/16/2012 17:15   Dg Chest Port 1 View  12/15/2012   CLINICAL DATA:  Fever, sirs  EXAM: PORTABLE CHEST - 1 VIEW  COMPARISON:  May 22, 2012.  FINDINGS: The lungs are adequately inflated. There are coarse lung markings at the left lung base which are more conspicuous than in the past. The cardiac silhouette is normal in size. There perihilar lung markings are not as conspicuous today. There is an electrode stimulating device generator present over the left pectoral region with electrodes extending off the film into the left neck. The bony structures exhibit no acute abnormality.  IMPRESSION: 1. There is likely atelectasis at the left lung base. A follow-up PA and lateral chest x-ray would be of value. 2. There is no evidence of CHF   Electronically Signed   By:  Grasiela Jonsson  [place]   On: 12/15/2012 17:19   Dg Abd Portable 2v  12/17/2012   CLINICAL DATA:  Left lateral abdominal pain. A nephrostomy tube placed yesterday. Abdominal distention and bloating.  EXAM: PORTABLE ABDOMEN - 2 VIEW  COMPARISON:  Nephrostomy placement of 1 day prior  FINDINGS: Supine and right side up decubitus views. The supine view demonstrates contrast throughout the colon, normal in caliber. No small bowel distension. Nephrostomy tube which projects over the left side of the abdomen.  Right side up decubitus views demonstrate no free intraperitoneal air or significant air-fluid levels.  IMPRESSION: No acute findings.   Electronically Signed   By: Jeronimo Greaves M.D.   On: 12/17/2012 17:48     Microbiology: Recent Results (from the past 240 hour(s))  URINE CULTURE     Status: None   Collection Time    12/15/12 12:01 PM      Result Value Range Status   Specimen Description URINE, CATHETERIZED   Final   Special Requests NONE   Final   Culture  Setup Time     Final   Value: 12/15/2012 18:08     Performed at [facility]   Culture     Final   Value: >=100,000 COLONIES/mL PROTEUS MIRABILIS     >=100,000 COLONIES/mL SERRATIA MARCESCENS     Performed at [facility]   Report Status 12/18/2012 FINAL   Final   Organism ID, Bacteria PROTEUS MIRABILIS   Final   Organism ID, Bacteria SERRATIA MARCESCENS   Final  CULTURE, BLOOD (ROUTINE X 2)     Status: None   Collection Time    12/15/12  2:14 PM      Result Value Range Status   Specimen Description BLOOD  L HAND   Final   Special Requests NONE BOTTLES DRAWN AEROBIC AND ANAEROBIC  5CC   Final   Culture  Setup Time     Final   Value: 12/15/2012 23:26     Performed at [facility]   Culture     Final   Value:        BLOOD CULTURE RECEIVED NO GROWTH TO DATE CULTURE WILL BE HELD FOR 5 DAYS BEFORE ISSUING A FINAL NEGATIVE REPORT     Performed at [facility]   Report Status PENDING   Incomplete  CULTURE,  BLOOD (ROUTINE X 2)     Status: None   Collection Time    12/15/12  2:21 PM      Result Value Range Status   Specimen Description BLOOD RIGHT ANTECUBITAL   Final   Special Requests NONE BOTTLES DRAWN AEROBIC AND ANAEROBIC 7CC   Final   Culture  Setup Time     Final   Value: 12/15/2012 23:26     Performed at [facility]   Culture     Final   Value:        BLOOD CULTURE RECEIVED NO GROWTH TO DATE CULTURE WILL BE HELD FOR 5 DAYS BEFORE ISSUING A FINAL NEGATIVE REPORT     Performed at [facility]   Report Status PENDING   Incomplete  URINE CULTURE     Status: None   Collection Time    12/16/12  4:32 PM      Result Value Range Status   Specimen Description KIDNEY LEFT   Final   Special Requests NONE   Final   Culture  Setup Time     Final   Value: 12/16/2012 22:46     Performed at [facility]   Culture     Final   Value: NO GROWTH     Performed at [facility]   Report Status 12/19/2012 FINAL   Final     Labs: Basic Metabolic Panel:  Recent Labs Lab 12/15/12 1040 12/16/12 0507 12/17/12 0500 12/18/12 0452 12/19/12 0430  NA 134* 135 135 135 137  K 4.1 4.1 3.9 3.8 3.3*  CL 99 100 103 100 102  CO2 27 27 25 26 27   GLUCOSE 118* 119* 99 90 102*  BUN 16 15 13 9 9   CREATININE 0.86 0.95 0.96 0.88 0.82  CALCIUM 9.3 8.8 8.5 8.9 8.6   Liver Function Tests:  Recent Labs Lab 12/15/12 1657 12/16/12 0507  AST 8 11  ALT <5 <5  ALKPHOS 70 65  BILITOT 0.5 0.5  PROT 7.1 6.2  ALBUMIN 3.2* 2.6*   No results found for this basename: LIPASE, AMYLASE,  in the last 168 hours No results found for this basename: AMMONIA,  in the last 168 hours CBC:  Recent Labs Lab 12/15/12 1040 12/16/12 0507 12/17/12 0500 12/18/12 0452 12/19/12 0430  WBC 18.9* 14.9* 11.9* 8.7 8.2  NEUTROABS 16.6*  --   --   --   --   HGB 13.3 12.0* 10.4* 11.5* 11.5*  HCT 40.7 36.6* 32.4* 35.3* 35.0*  MCV 91.3 92.0 92.6 91.5 90.2  PLT 456* 420* 369 403* 416*   Cardiac  Enzymes: No results found for this basename: CKTOTAL, CKMB, CKMBINDEX, TROPONINI,  in the last 168 hours BNP: No components found with this basename: POCBNP,  CBG: No results found for this basename: GLUCAP,  in the last 168 hours  Time coordinating discharge:  Greater than 30 minutes  Signed:  Quadry Kampa, DO [facility] Pager: [phone] 12/19/2012, 10:49 AM

## 2012-12-19 NOTE — Progress Notes (Addendum)
Subjective: Pt feeling better since left nephrostomy placed; has mild left flank discomfort  Objective: Vital signs in last 24 hours: Temp:  [98.1 F (36.7 C)-98.2 F (36.8 C)] 98.2 F (36.8 C) (11/17 0604) Pulse Rate:  [71-82] 82 (11/17 0604) Resp:  [16-20] 20 (11/17 0604) BP: (111-122)/(62-65) 116/63 mmHg (11/17 0604) SpO2:  [96 %-97 %] 96 % (11/17 0604) Last BM Date: 12/19/12  Intake/Output from previous day: 11/16 0701 - 11/17 0700 In: 935 [P.O.:480; IV Piggyback:450] Out: 1856 [Urine:1855; Stool:1] Intake/Output this shift:    Left PCN intact, output 380 cc's sl turbid, yellow urine; cx's-proteus, serratia  Lab Results:   Recent Labs  12/18/12 0452 12/19/12 0430  WBC 8.7 8.2  HGB 11.5* 11.5*  HCT 35.3* 35.0*  PLT 403* 416*   BMET  Recent Labs  12/18/12 0452 12/19/12 0430  NA 135 137  K 3.8 3.3*  CL 100 102  CO2 26 27  GLUCOSE 90 102*  BUN 9 9  CREATININE 0.88 0.82  CALCIUM 8.9 8.6   PT/INR  Recent Labs  12/16/12 1147  LABPROT 14.1  INR 1.11   ABG No results found for this basename: PHART, PCO2, PO2, HCO3,  in the last 72 hours  Studies/Results: Dg Abd Portable 2v  12/17/2012   CLINICAL DATA:  Left lateral abdominal pain. A nephrostomy tube placed yesterday. Abdominal distention and bloating.  EXAM: PORTABLE ABDOMEN - 2 VIEW  COMPARISON:  Nephrostomy placement of 1 day prior  FINDINGS: Supine and right side up decubitus views. The supine view demonstrates contrast throughout the colon, normal in caliber. No small bowel distension. Nephrostomy tube which projects over the left side of the abdomen.  Right side up decubitus views demonstrate no free intraperitoneal air or significant air-fluid levels.  IMPRESSION: No acute findings.   Electronically Signed   By: Jeronimo Greaves M.D.   On: 12/17/2012 17:48   Recent Results (from the past 240 hour(s))  URINE CULTURE     Status: None   Collection Time    12/15/12 12:01 PM      Result Value Range  Status   Specimen Description URINE, CATHETERIZED   Final   Special Requests NONE   Final   Culture  Setup Time     Final   Value: 12/15/2012 18:08     Performed at [facility]   Culture     Final   Value: >=100,000 COLONIES/mL PROTEUS MIRABILIS     >=100,000 COLONIES/mL SERRATIA MARCESCENS     Performed at [facility]   Report Status 12/18/2012 FINAL   Final   Organism ID, Bacteria PROTEUS MIRABILIS   Final   Organism ID, Bacteria SERRATIA MARCESCENS   Final  CULTURE, BLOOD (ROUTINE X 2)     Status: None   Collection Time    12/15/12  2:14 PM      Result Value Range Status   Specimen Description BLOOD  L HAND   Final   Special Requests NONE BOTTLES DRAWN AEROBIC AND ANAEROBIC 5CC   Final   Culture  Setup Time     Final   Value: 12/15/2012 23:26     Performed at [facility]   Culture     Final   Value:        BLOOD CULTURE RECEIVED NO GROWTH TO DATE CULTURE WILL BE HELD FOR 5 DAYS BEFORE ISSUING A FINAL NEGATIVE REPORT     Performed at [facility]   Report Status PENDING  Incomplete  CULTURE, BLOOD (ROUTINE X 2)     Status: None   Collection Time    12/15/12  2:21 PM      Result Value Range Status   Specimen Description BLOOD RIGHT ANTECUBITAL   Final   Special Requests NONE BOTTLES DRAWN AEROBIC AND ANAEROBIC [facility]   Final   Culture  Setup Time     Final   Value: 12/15/2012 23:26     Performed at [facility]   Culture     Final   Value:        BLOOD CULTURE RECEIVED NO GROWTH TO DATE CULTURE WILL BE HELD FOR 5 DAYS BEFORE ISSUING A FINAL NEGATIVE REPORT     Performed at [facility]   Report Status PENDING   Incomplete  URINE CULTURE     Status: None   Collection Time    12/16/12  4:32 PM      Result Value Range Status   Specimen Description KIDNEY LEFT   Final   Special Requests NONE   Final   Culture  Setup Time     Final   Value: 12/16/2012 22:46     Performed at [facility]   Culture     Final   Value:  NO GROWTH     Performed at [facility]   Report Status 12/19/2012 FINAL   Final    Anti-infectives: Anti-infectives   Start     Dose/Rate Route Frequency Ordered Stop   12/19/12 1100  ciprofloxacin (CIPRO) tablet 250 mg  Status:  Discontinued     250 mg Oral 2 times daily 12/19/12 1015 12/19/12 1036   12/19/12 1100  ciprofloxacin (CIPRO) tablet 500 mg     500 mg Oral 2 times daily 12/19/12 1036     12/19/12 0000  ciprofloxacin (CIPRO) 250 MG tablet  Status:  Discontinued     250 mg Oral 2 times daily 12/19/12 1033 12/19/12    12/19/12 0000  ciprofloxacin (CIPRO) 500 MG tablet     500 mg Oral 2 times daily 12/19/12 1037     12/15/12 1800  vancomycin (VANCOCIN) 1,250 mg in sodium chloride 0.9 % 250 mL IVPB  Status:  Discontinued     1,250 mg 166.7 mL/hr over 90 Minutes Intravenous Every 12 hours 12/15/12 1640 12/18/12 1453   12/15/12 1700  piperacillin-tazobactam (ZOSYN) IVPB 3.375 g  Status:  Discontinued     3.375 g 12.5 mL/hr over 240 Minutes Intravenous Every 8 hours 12/15/12 1630 12/19/12 1015   12/15/12 1630  piperacillin-tazobactam (ZOSYN) IVPB 3.375 g  Status:  Discontinued     3.375 g 100 mL/hr over 30 Minutes Intravenous 3 times per day 12/15/12 1625 12/15/12 1630   12/15/12 1315  cefTRIAXone (ROCEPHIN) 1 g in dextrose 5 % 50 mL IVPB     1 g 100 mL/hr over 30 Minutes Intravenous  Once 12/15/12 1304 12/15/12 1505      Assessment/Plan: s/p left PCN 11/14 secondary to urosepsis, hydro, left ureteral obstruction by calculus; cont current tx ; further plans as per urology  LOS: 4 days    ALLRED,D [facility] 12/19/2012

## 2012-12-20 DIAGNOSIS — N400 Enlarged prostate without lower urinary tract symptoms: Secondary | ICD-10-CM | POA: Diagnosis not present

## 2012-12-20 DIAGNOSIS — Z436 Encounter for attention to other artificial openings of urinary tract: Secondary | ICD-10-CM | POA: Diagnosis not present

## 2012-12-20 DIAGNOSIS — N10 Acute tubulo-interstitial nephritis: Secondary | ICD-10-CM | POA: Diagnosis not present

## 2012-12-20 DIAGNOSIS — Z466 Encounter for fitting and adjustment of urinary device: Secondary | ICD-10-CM | POA: Diagnosis not present

## 2012-12-20 DIAGNOSIS — G20 Parkinson's disease: Secondary | ICD-10-CM | POA: Diagnosis not present

## 2012-12-20 DIAGNOSIS — R339 Retention of urine, unspecified: Secondary | ICD-10-CM | POA: Diagnosis not present

## 2012-12-21 LAB — CULTURE, BLOOD (ROUTINE X 2): Culture: NO GROWTH

## 2012-12-22 DIAGNOSIS — N400 Enlarged prostate without lower urinary tract symptoms: Secondary | ICD-10-CM | POA: Diagnosis not present

## 2012-12-22 DIAGNOSIS — N10 Acute tubulo-interstitial nephritis: Secondary | ICD-10-CM | POA: Diagnosis not present

## 2012-12-22 DIAGNOSIS — Z466 Encounter for fitting and adjustment of urinary device: Secondary | ICD-10-CM | POA: Diagnosis not present

## 2012-12-22 DIAGNOSIS — Z436 Encounter for attention to other artificial openings of urinary tract: Secondary | ICD-10-CM | POA: Diagnosis not present

## 2012-12-22 DIAGNOSIS — G20 Parkinson's disease: Secondary | ICD-10-CM | POA: Diagnosis not present

## 2012-12-22 DIAGNOSIS — R339 Retention of urine, unspecified: Secondary | ICD-10-CM | POA: Diagnosis not present

## 2012-12-23 DIAGNOSIS — N400 Enlarged prostate without lower urinary tract symptoms: Secondary | ICD-10-CM | POA: Diagnosis not present

## 2012-12-23 DIAGNOSIS — Z436 Encounter for attention to other artificial openings of urinary tract: Secondary | ICD-10-CM | POA: Diagnosis not present

## 2012-12-23 DIAGNOSIS — N10 Acute tubulo-interstitial nephritis: Secondary | ICD-10-CM | POA: Diagnosis not present

## 2012-12-23 DIAGNOSIS — G20 Parkinson's disease: Secondary | ICD-10-CM | POA: Diagnosis not present

## 2012-12-23 DIAGNOSIS — R339 Retention of urine, unspecified: Secondary | ICD-10-CM | POA: Diagnosis not present

## 2012-12-23 DIAGNOSIS — Z466 Encounter for fitting and adjustment of urinary device: Secondary | ICD-10-CM | POA: Diagnosis not present

## 2012-12-26 DIAGNOSIS — N10 Acute tubulo-interstitial nephritis: Secondary | ICD-10-CM | POA: Diagnosis not present

## 2012-12-26 DIAGNOSIS — N400 Enlarged prostate without lower urinary tract symptoms: Secondary | ICD-10-CM | POA: Diagnosis not present

## 2012-12-26 DIAGNOSIS — Z436 Encounter for attention to other artificial openings of urinary tract: Secondary | ICD-10-CM | POA: Diagnosis not present

## 2012-12-26 DIAGNOSIS — R339 Retention of urine, unspecified: Secondary | ICD-10-CM | POA: Diagnosis not present

## 2012-12-26 DIAGNOSIS — G20 Parkinson's disease: Secondary | ICD-10-CM | POA: Diagnosis not present

## 2012-12-26 DIAGNOSIS — Z466 Encounter for fitting and adjustment of urinary device: Secondary | ICD-10-CM | POA: Diagnosis not present

## 2012-12-28 DIAGNOSIS — Z436 Encounter for attention to other artificial openings of urinary tract: Secondary | ICD-10-CM | POA: Diagnosis not present

## 2012-12-28 DIAGNOSIS — N10 Acute tubulo-interstitial nephritis: Secondary | ICD-10-CM | POA: Diagnosis not present

## 2012-12-28 DIAGNOSIS — R339 Retention of urine, unspecified: Secondary | ICD-10-CM | POA: Diagnosis not present

## 2012-12-28 DIAGNOSIS — G20 Parkinson's disease: Secondary | ICD-10-CM | POA: Diagnosis not present

## 2012-12-28 DIAGNOSIS — Z466 Encounter for fitting and adjustment of urinary device: Secondary | ICD-10-CM | POA: Diagnosis not present

## 2012-12-28 DIAGNOSIS — N400 Enlarged prostate without lower urinary tract symptoms: Secondary | ICD-10-CM | POA: Diagnosis not present

## 2012-12-30 DIAGNOSIS — Z436 Encounter for attention to other artificial openings of urinary tract: Secondary | ICD-10-CM | POA: Diagnosis not present

## 2012-12-30 DIAGNOSIS — N10 Acute tubulo-interstitial nephritis: Secondary | ICD-10-CM | POA: Diagnosis not present

## 2012-12-30 DIAGNOSIS — G20 Parkinson's disease: Secondary | ICD-10-CM | POA: Diagnosis not present

## 2012-12-30 DIAGNOSIS — Z466 Encounter for fitting and adjustment of urinary device: Secondary | ICD-10-CM | POA: Diagnosis not present

## 2012-12-30 DIAGNOSIS — R339 Retention of urine, unspecified: Secondary | ICD-10-CM | POA: Diagnosis not present

## 2012-12-30 DIAGNOSIS — N400 Enlarged prostate without lower urinary tract symptoms: Secondary | ICD-10-CM | POA: Diagnosis not present

## 2013-01-03 DIAGNOSIS — N139 Obstructive and reflux uropathy, unspecified: Secondary | ICD-10-CM | POA: Diagnosis not present

## 2013-01-03 DIAGNOSIS — N10 Acute tubulo-interstitial nephritis: Secondary | ICD-10-CM | POA: Diagnosis not present

## 2013-01-03 DIAGNOSIS — N400 Enlarged prostate without lower urinary tract symptoms: Secondary | ICD-10-CM | POA: Diagnosis not present

## 2013-01-03 DIAGNOSIS — R339 Retention of urine, unspecified: Secondary | ICD-10-CM | POA: Diagnosis not present

## 2013-01-03 DIAGNOSIS — Z436 Encounter for attention to other artificial openings of urinary tract: Secondary | ICD-10-CM | POA: Diagnosis not present

## 2013-01-03 DIAGNOSIS — N201 Calculus of ureter: Secondary | ICD-10-CM | POA: Diagnosis not present

## 2013-01-03 DIAGNOSIS — N401 Enlarged prostate with lower urinary tract symptoms: Secondary | ICD-10-CM | POA: Diagnosis not present

## 2013-01-03 DIAGNOSIS — G20 Parkinson's disease: Secondary | ICD-10-CM | POA: Diagnosis not present

## 2013-01-03 DIAGNOSIS — Z466 Encounter for fitting and adjustment of urinary device: Secondary | ICD-10-CM | POA: Diagnosis not present

## 2013-01-04 ENCOUNTER — Other Ambulatory Visit: Payer: Self-pay | Admitting: Urology

## 2013-01-04 ENCOUNTER — Encounter (HOSPITAL_BASED_OUTPATIENT_CLINIC_OR_DEPARTMENT_OTHER): Payer: Self-pay | Admitting: *Deleted

## 2013-01-04 DIAGNOSIS — N400 Enlarged prostate without lower urinary tract symptoms: Secondary | ICD-10-CM | POA: Diagnosis not present

## 2013-01-04 DIAGNOSIS — G20 Parkinson's disease: Secondary | ICD-10-CM | POA: Diagnosis not present

## 2013-01-04 DIAGNOSIS — N10 Acute tubulo-interstitial nephritis: Secondary | ICD-10-CM | POA: Diagnosis not present

## 2013-01-04 DIAGNOSIS — Z436 Encounter for attention to other artificial openings of urinary tract: Secondary | ICD-10-CM | POA: Diagnosis not present

## 2013-01-04 DIAGNOSIS — R339 Retention of urine, unspecified: Secondary | ICD-10-CM | POA: Diagnosis not present

## 2013-01-04 DIAGNOSIS — Z466 Encounter for fitting and adjustment of urinary device: Secondary | ICD-10-CM | POA: Diagnosis not present

## 2013-01-05 ENCOUNTER — Encounter (HOSPITAL_BASED_OUTPATIENT_CLINIC_OR_DEPARTMENT_OTHER): Payer: Self-pay | Admitting: *Deleted

## 2013-01-05 DIAGNOSIS — N400 Enlarged prostate without lower urinary tract symptoms: Secondary | ICD-10-CM | POA: Diagnosis not present

## 2013-01-05 DIAGNOSIS — R339 Retention of urine, unspecified: Secondary | ICD-10-CM | POA: Diagnosis not present

## 2013-01-05 DIAGNOSIS — Z436 Encounter for attention to other artificial openings of urinary tract: Secondary | ICD-10-CM | POA: Diagnosis not present

## 2013-01-05 DIAGNOSIS — Z466 Encounter for fitting and adjustment of urinary device: Secondary | ICD-10-CM | POA: Diagnosis not present

## 2013-01-05 DIAGNOSIS — N10 Acute tubulo-interstitial nephritis: Secondary | ICD-10-CM | POA: Diagnosis not present

## 2013-01-05 DIAGNOSIS — G20 Parkinson's disease: Secondary | ICD-10-CM | POA: Diagnosis not present

## 2013-01-05 NOTE — Progress Notes (Addendum)
NPO AFTER MN. ARRIVE AT 1000. NEEDS KUB AND ISTAT. WILL TAKE SINEMENT AM DOS W/ SIPS OF WATER.  REVIEWED CHART W/ DR EWELL MDA,  STATE GET UPDATED 2 VIEW CXR DOS SINCE CT SHOWED SMALL PLEURAL EFFUSION.

## 2013-01-06 ENCOUNTER — Encounter (HOSPITAL_BASED_OUTPATIENT_CLINIC_OR_DEPARTMENT_OTHER): Payer: Medicare Other | Admitting: Certified Registered"

## 2013-01-06 ENCOUNTER — Encounter (HOSPITAL_BASED_OUTPATIENT_CLINIC_OR_DEPARTMENT_OTHER): Payer: Self-pay | Admitting: *Deleted

## 2013-01-06 ENCOUNTER — Encounter (HOSPITAL_BASED_OUTPATIENT_CLINIC_OR_DEPARTMENT_OTHER)
Admission: RE | Disposition: A | Discharge status: Home / Self Care | Payer: Self-pay | Source: Ambulatory Visit | Attending: Urology

## 2013-01-06 ENCOUNTER — Ambulatory Visit (HOSPITAL_BASED_OUTPATIENT_CLINIC_OR_DEPARTMENT_OTHER)
Admission: RE | Admit: 2013-01-06 | Discharge: 2013-01-06 | Disposition: A | Discharge status: Home / Self Care | Payer: Medicare Other | Source: Ambulatory Visit | Attending: Urology | Admitting: Urology

## 2013-01-06 ENCOUNTER — Ambulatory Visit (HOSPITAL_COMMUNITY): Payer: Medicare Other

## 2013-01-06 ENCOUNTER — Ambulatory Visit (HOSPITAL_BASED_OUTPATIENT_CLINIC_OR_DEPARTMENT_OTHER): Payer: Medicare Other | Admitting: Certified Registered"

## 2013-01-06 DIAGNOSIS — G20A1 Parkinson's disease without dyskinesia, without mention of fluctuations: Secondary | ICD-10-CM | POA: Insufficient documentation

## 2013-01-06 DIAGNOSIS — R339 Retention of urine, unspecified: Secondary | ICD-10-CM | POA: Diagnosis not present

## 2013-01-06 DIAGNOSIS — N1330 Unspecified hydronephrosis: Secondary | ICD-10-CM | POA: Insufficient documentation

## 2013-01-06 DIAGNOSIS — Z87440 Personal history of urinary (tract) infections: Secondary | ICD-10-CM | POA: Diagnosis not present

## 2013-01-06 DIAGNOSIS — G20 Parkinson's disease: Secondary | ICD-10-CM | POA: Diagnosis not present

## 2013-01-06 DIAGNOSIS — Z79899 Other long term (current) drug therapy: Secondary | ICD-10-CM | POA: Insufficient documentation

## 2013-01-06 DIAGNOSIS — N201 Calculus of ureter: Secondary | ICD-10-CM | POA: Diagnosis not present

## 2013-01-06 DIAGNOSIS — N200 Calculus of kidney: Secondary | ICD-10-CM | POA: Diagnosis not present

## 2013-01-06 DIAGNOSIS — N400 Enlarged prostate without lower urinary tract symptoms: Secondary | ICD-10-CM | POA: Diagnosis not present

## 2013-01-06 DIAGNOSIS — Z01818 Encounter for other preprocedural examination: Secondary | ICD-10-CM | POA: Diagnosis not present

## 2013-01-06 DIAGNOSIS — K751 Phlebitis of portal vein: Secondary | ICD-10-CM | POA: Diagnosis not present

## 2013-01-06 HISTORY — DX: Parkinson's disease without dyskinesia, without mention of fluctuations: G20.A1

## 2013-01-06 HISTORY — DX: Benign prostatic hyperplasia without lower urinary tract symptoms: N40.0

## 2013-01-06 HISTORY — DX: Calculus of ureter: N20.1

## 2013-01-06 HISTORY — DX: Cyst of kidney, acquired: N28.1

## 2013-01-06 HISTORY — DX: Personal history of urinary calculi: Z87.442

## 2013-01-06 HISTORY — DX: Presence of other specified functional implants: Z96.89

## 2013-01-06 HISTORY — DX: Induration penis plastica: N48.6

## 2013-01-06 HISTORY — PX: HOLMIUM LASER APPLICATION: SHX5852

## 2013-01-06 HISTORY — PX: STONE EXTRACTION WITH BASKET: SHX5318

## 2013-01-06 HISTORY — PX: CYSTOSCOPY WITH RETROGRADE PYELOGRAM, URETEROSCOPY AND STENT PLACEMENT: SHX5789

## 2013-01-06 HISTORY — DX: Parkinson's disease: G20

## 2013-01-06 HISTORY — DX: Unspecified symptoms and signs involving the genitourinary system: R39.9

## 2013-01-06 LAB — POCT I-STAT 4, (NA,K, GLUC, HGB,HCT)
Glucose, Bld: 94 mg/dL (ref 70–99)
Hemoglobin: 13.3 g/dL (ref 13.0–17.0)
Potassium: 4 mEq/L (ref 3.5–5.1)
Sodium: 139 mEq/L (ref 135–145)

## 2013-01-06 SURGERY — CYSTOURETEROSCOPY, WITH RETROGRADE PYELOGRAM AND STENT INSERTION
Anesthesia: General | Site: Ureter | Laterality: Left

## 2013-01-06 MED ORDER — CEFAZOLIN SODIUM-DEXTROSE 2-3 GM-% IV SOLR
2.0000 g | INTRAVENOUS | Status: AC
  Administered 2013-01-06: 2 g via INTRAVENOUS
  Filled 2013-01-06: qty 50

## 2013-01-06 MED ORDER — LIDOCAINE HCL (CARDIAC) 20 MG/ML IV SOLN
INTRAVENOUS | Status: DC | PRN
  Administered 2013-01-06: 60 mg via INTRAVENOUS

## 2013-01-06 MED ORDER — HYDROCODONE-ACETAMINOPHEN 5-325 MG PO TABS: 1.0000 | ORAL_TABLET | Freq: Four times a day (QID) | ORAL | Status: DC | PRN

## 2013-01-06 MED ORDER — FENTANYL CITRATE 0.05 MG/ML IJ SOLN
INTRAMUSCULAR | Status: AC
  Filled 2013-01-06: qty 4

## 2013-01-06 MED ORDER — MIDAZOLAM HCL 2 MG/2ML IJ SOLN
INTRAMUSCULAR | Status: AC
  Filled 2013-01-06: qty 2

## 2013-01-06 MED ORDER — LACTATED RINGERS IV SOLN
INTRAVENOUS | Status: DC
  Administered 2013-01-06 (×2): via INTRAVENOUS
  Filled 2013-01-06: qty 1000

## 2013-01-06 MED ORDER — STERILE WATER FOR IRRIGATION IR SOLN
Status: DC | PRN
  Administered 2013-01-06: 500 mL

## 2013-01-06 MED ORDER — KETOROLAC TROMETHAMINE 30 MG/ML IJ SOLN
INTRAMUSCULAR | Status: DC | PRN
  Administered 2013-01-06: 30 mg via INTRAVENOUS

## 2013-01-06 MED ORDER — FENTANYL CITRATE 0.05 MG/ML IJ SOLN
INTRAMUSCULAR | Status: DC | PRN
  Administered 2013-01-06 (×2): 25 ug via INTRAVENOUS
  Administered 2013-01-06: 50 ug via INTRAVENOUS

## 2013-01-06 MED ORDER — IOHEXOL 350 MG/ML SOLN
INTRAVENOUS | Status: DC | PRN
  Administered 2013-01-06: 12 mL

## 2013-01-06 MED ORDER — MIDAZOLAM HCL 5 MG/5ML IJ SOLN
INTRAMUSCULAR | Status: DC | PRN
  Administered 2013-01-06: 2 mg via INTRAVENOUS

## 2013-01-06 MED ORDER — SODIUM CHLORIDE 0.9 % IR SOLN
Status: DC | PRN
  Administered 2013-01-06: 5000 mL

## 2013-01-06 MED ORDER — ONDANSETRON HCL 4 MG/2ML IJ SOLN
INTRAMUSCULAR | Status: DC | PRN
  Administered 2013-01-06: 4 mg via INTRAVENOUS

## 2013-01-06 MED ORDER — DEXAMETHASONE SODIUM PHOSPHATE 4 MG/ML IJ SOLN
INTRAMUSCULAR | Status: DC | PRN
  Administered 2013-01-06: 10 mg via INTRAVENOUS

## 2013-01-06 MED ORDER — PROPOFOL 10 MG/ML IV BOLUS
INTRAVENOUS | Status: DC | PRN
  Administered 2013-01-06: 180 mg via INTRAVENOUS

## 2013-01-06 MED ORDER — CEFAZOLIN SODIUM 1-5 GM-% IV SOLN
1.0000 g | INTRAVENOUS | Status: DC
  Filled 2013-01-06: qty 50

## 2013-01-06 SURGICAL SUPPLY — 45 items
ADAPTER CATH URET PLST 4-6FR (CATHETERS) IMPLANT
ADPR CATH URET STRL DISP 4-6FR (CATHETERS)
BAG DRAIN URO-CYSTO SKYTR STRL (DRAIN) ×2 IMPLANT
BAG DRN UROCATH (DRAIN) ×1
BAG URINE DRAINAGE (UROLOGICAL SUPPLIES) ×2 IMPLANT
BASKET LASER NITINOL 1.9FR (BASKET) IMPLANT
BASKET STNLS GEMINI 4WIRE 3FR (BASKET) IMPLANT
BASKET ZERO TIP NITINOL 2.4FR (BASKET) ×6 IMPLANT
BRUSH URET BIOPSY 3F (UROLOGICAL SUPPLIES) IMPLANT
BSKT STON RTRVL 120 1.9FR (BASKET)
BSKT STON RTRVL GEM 120X11 3FR (BASKET)
BSKT STON RTRVL ZERO TP 2.4FR (BASKET) ×3
CANISTER SUCT LVC 12 LTR MEDI- (MISCELLANEOUS) ×2 IMPLANT
CATH COUDE FOLEY 2W 5CC 18FR (CATHETERS) ×2 IMPLANT
CATH INTERMIT  6FR 70CM (CATHETERS) ×2 IMPLANT
CATH URET 5FR 28IN CONE TIP (BALLOONS)
CATH URET 5FR 28IN OPEN ENDED (CATHETERS) IMPLANT
CATH URET 5FR 70CM CONE TIP (BALLOONS) IMPLANT
CLOTH BEACON ORANGE TIMEOUT ST (SAFETY) ×2 IMPLANT
DRAPE CAMERA CLOSED 9X96 (DRAPES) IMPLANT
FIBER LASER FLEXIVA 1000 (UROLOGICAL SUPPLIES) IMPLANT
FIBER LASER FLEXIVA 200 (UROLOGICAL SUPPLIES) IMPLANT
FIBER LASER FLEXIVA 365 (UROLOGICAL SUPPLIES) ×2 IMPLANT
FIBER LASER FLEXIVA 550 (UROLOGICAL SUPPLIES) IMPLANT
GLOVE BIO SURGEON STRL SZ7 (GLOVE) ×4 IMPLANT
GLOVE BIOGEL PI IND STRL 7.5 (GLOVE) ×2 IMPLANT
GLOVE BIOGEL PI INDICATOR 7.5 (GLOVE) ×2
GLOVE INDICATOR 7.0 STRL GRN (GLOVE) ×4 IMPLANT
GOWN STRL NON-REIN LRG LVL3 (GOWN DISPOSABLE) ×6 IMPLANT
GUIDEWIRE 0.038 PTFE COATED (WIRE) ×2 IMPLANT
GUIDEWIRE ANG ZIPWIRE 038X150 (WIRE) ×2 IMPLANT
GUIDEWIRE STR DUAL SENSOR (WIRE) ×2 IMPLANT
IV NS 1000ML (IV SOLUTION) ×8
IV NS 1000ML BAXH (IV SOLUTION) ×4 IMPLANT
IV NS IRRIG 3000ML ARTHROMATIC (IV SOLUTION) ×2 IMPLANT
KIT BALLIN UROMAX 15FX10 (LABEL) IMPLANT
KIT BALLN UROMAX 15FX4 (MISCELLANEOUS) IMPLANT
KIT BALLN UROMAX 26 75X4 (MISCELLANEOUS)
NS IRRIG 500ML POUR BTL (IV SOLUTION) IMPLANT
PACK CYSTOSCOPY (CUSTOM PROCEDURE TRAY) ×2 IMPLANT
SET HIGH PRES BAL DIL (LABEL)
SHEATH URET ACCESS 12FR/35CM (UROLOGICAL SUPPLIES) IMPLANT
SHEATH URET ACCESS 12FR/55CM (UROLOGICAL SUPPLIES) IMPLANT
STENT URET 6FRX24 CONTOUR (STENTS) ×2 IMPLANT
SYRINGE IRR TOOMEY STRL 70CC (SYRINGE) ×2 IMPLANT

## 2013-01-06 NOTE — Transfer of Care (Signed)
Immediate Anesthesia Transfer of Care Note  Patient: Treyten Monestime  Procedure(s) Performed: Procedure(s) (LRB): CYSTOSCOPY WITH RETROGRADE PYELOGRAM, URETEROSCOPY AND STENT PLACEMENT (Left) HOLMIUM LASER APPLICATION (Left) STONE EXTRACTION WITH BASKET (Left)  Patient Location: PACU  Anesthesia Type: General  Level of Consciousness: awake, oriented, sedated and patient cooperative  Airway & Oxygen Therapy: Patient Spontanous Breathing and Patient connected to face mask oxygen  Post-op Assessment: Report given to PACU RN and Post -op Vital signs reviewed and stable  Post vital signs: Reviewed and stable  Complications: No apparent anesthesia complications

## 2013-01-06 NOTE — Op Note (Signed)
Zavien Clubb is a 67 y.o.   01/06/2013  General  Pre-op diagnosis: Left hydronephrosis. Left distal ureteral calculi. History of urosepsis.  Postop diagnosis: Same  Procedure done: Cystoscopy, ureteroscopy, holmium laser left distal ureteral calculi, stone extraction, left retrograde pyelogram, insertion of double-J stent  Surgeon: Wendie Simmer. Abel Hageman  Anesthesia: General  Indication: Patient is a 67 years old male with a history of Parkinson disease who was admitted 2 weeks ago with the urosepsis and left hydronephrosis secondary to a large distal ureteral stone. He had a percutaneous nephrostomy tube drain in the kidney. He also has a Foley catheter for urinary retention. KUB today shows several stones in the distal ureter. He is scheduled for stone manipulation  Procedure: The patient was identified by his wrist band and proper timeout was taken.  Under general anesthesia he was prepped and draped and placed in the dorsolithotomy position. The Foley catheter was removed. A panendoscope was inserted in the bladder. The anterior urethra is normal. There is trilobar prostatic hypertrophy with obstruction of the bladder neck. The bladder is moderately trabeculated. There is no stone or tumor in the bladder. The ureteral orifices are in normal position and shape. A sensor wire was passed through the cystoscope and the left ureteral orifice but could not be advanced beyond the stones. The sensor wire was removed. A Glidewire would not pass either beyond the stone. The cystoscope was then removed. A semirigid ureteroscope was inserted in the bladder and through the left ureteral orifice. A large stone was identified in the distal ureter and occupied the entire circumference of the ureter. With a 365 microfiber holmium laser at a setting of 0.5 J and 10 shocks the stone was fragmented in multiple small fragments. Then the sensor wire was passed through the ureteroscope and was advanced in the renal pelvis.  With a Nitinol basket the smaller stone fragments were extracted and dropped in the bladder. A larger fragment was caught within the wires of the basket and could not be removed. The ureteroscope was then removed. The basket was then clamped with a hemostat and cut. The ureteroscope was then reinserted in the ureter. The larger fragment was then fragmented with the holmium laser. All small stone fragments were extracted and dropped in the bladder. A larger fragment was noted proximal to the site of the previous stone. It was again  fragmented with the holmium laser and the smaller fragments were then removed and placed in the bladder. At this point there was no evidence of remaining stone fragments in the ureter.  Retrograde pyelogram:  Contrast was then injected through the ureteroscope. There is no evidence of filling defect in the ureter. The nephrostomy catheter was noted in the renal pelvis. The distal ureter appears normal and there is no evidence of extravasation of contrast. The ureteroscope was then removed under direct vision. The sensor wire was then backloaded into the cystoscope.  A #6 [place] last 24 double-J stent was passed over the sensor wire. The proximal end of the double-J stent is in the renal pelvis the distal curl is in the bladder. The sensor wire was then removed. The bladder was irrigated with normal saline and all stone fragments were removed out of the bladder.  Because of his history of urinary retention a #18 [place] Foley catheter was reinserted in the bladder. The patient will be given a voiding trial as an outpatient.  The nephrostomy catheter was then capped off.  The patient tolerated the procedure well and  left the OR in satisfactory condition to postanesthesia care unit. EBL:   Needles, sponges count:

## 2013-01-06 NOTE — H&P (Signed)
History of Present Illness      [name] [name] was seen in consultation about 2 weeks ago for left hydronephrosis secondary to a large left distal ureteral stone and urosepsis. He had a percutaneous nephrostomy to drain the kidney. He has a Foley catheter for urinary retention. He is on alfuzosin. He needs definitive management of the ureteral stone. We will evaluate the prostatic urethra at that time and consider removing the Foley again for voiding trial. The procedure, risks, benefits were discussed with the patient and his wife. The risks include but are not limited to hemorrhage, infection, ureteral injury, inability to extract the stone. They understand and wish to proceed. He is also known to have two small stones in the lower pole of the right kidney. The brain stimulator has not been activated yet.         [name] [name] has a history of nephrolithiasis. He had ESL for a left proximal ureteral calculus on 01/05/11.     He was seen in April with frequency, decreased stream, and straining on urination. PVR was 4 ml. He was started on an antibiotic for possible cystitis, but his culture was negative. He does take alfuzosin for BPH.    Last week I saw [name]. [name] as he was unable to void. He had just had a brain stimulator placed for his Parkinson's in [place]. Since he was discharged from the hospital he had been constipated and his stream has been progressively weak. PVR was 645 cc. He is already on max dose of alfuzosin so a voiding trial will be repeated in a week or so.    Interval history: [name]. [name] presents today with LLQ abdominal pain. This was sudden in onset last night. Nothing makes it better or worse. It is not associated with any other symptoms. He is not currently constipated. He describes the pain as a 9/10. Otherwise he was doing well. His catheter was draining yellow urine. He denies flank pain, hematuria, n/v or f/c.   Past Medical History Problems  1. History of Anxiety  (300.00) 2. History of Benign prostatic hypertrophy without lower urinary tract symptoms (600.00) 3. History of Parkinson's disease (332.0) 4. History of Peyronie's disease (607.85)  Surgical History Problems  1. History of Cystoscopy With Insertion Of Ureteral Stent 2. History of Cystoscopy With Insertion Of Ureteral Stent Left 3. History of Cystoscopy With Ureteroscopy With Lithotripsy 4. History of Cystoscopy With Ureteroscopy With Manipulation Of Calculus 5. History of Lithotripsy 6. History of Lithotripsy  Current Meds 1. Acetaminophen 325 MG Oral Tablet;  Therapy: (Recorded:03Nov2014) to Recorded 2. Alfuzosin HCl ER 10 MG Oral Tablet Extended Release 24 Hour; TAKE ONE TABLET BY  MOUTH DAILY;  Therapy: 26Nov2012 to (Evaluate:29Jun2014)  Requested for: 01May2014; Last  Rx:30Apr2014 Ordered 3. Carbidopa-Levodopa 25-100 MG Oral Tablet;  Therapy: (Recorded:28Apr2014) to Recorded 4. ClonazePAM 1 MG Oral Tablet;  Therapy: (Recorded:03Nov2014) to Recorded 5. Docusate Sodium 100 MG Oral Capsule;  Therapy: (Recorded:03Nov2014) to Recorded 6. Ketorolac Tromethamine 10 MG Oral Tablet; TAKE 1 TABLET EVERY 6 HOURS AS  NEEDED;  Therapy: 10Nov2014 to (Evaluate:18Nov2014)  Requested for: 10Nov2014; Last  Rx:10Nov2014 Ordered 7. Neupro 2 MG/24HR Transdermal Patch 24 Hour;  Therapy: (Recorded:28Apr2014) to Recorded 8. OxyCODONE HCl - 5 MG Oral Tablet;  Therapy: (Recorded:03Nov2014) to Recorded 9. Sennosides 8.6 MG Oral Tablet;  Therapy: (Recorded:03Nov2014) to Recorded 10. Xanax TABS;   Therapy: (Recorded:28Aug2012) to Recorded 11. Zoloft 100 MG Oral Tablet;   Therapy: (Recorded:28Apr2014) to Recorded  Allergies Medication  1.  Tamsulosin HCl CAPS  Family History Problems  1. Family history of Brain Cancer (V16.8) : Father 2. Family history of Father Deceased At Age ____   71 cancer 3. Family history of Lung Cancer (V16.1) 4. Family history of Mother Deceased At Age ____   43  stroke 5. Family history of Rheumatoid Arthritis : Mother 6. Family history of Transient Ischemic Attack : Father  Social History Problems  1. Denied: History of Alcohol Use 2. Caffeine Use   2 3. Marital History - Currently Married 4. Never A Smoker  Review of Systems Genitourinary, constitutional, skin, eye, otolaryngeal, hematologic/lymphatic, cardiovascular, pulmonary, endocrine, musculoskeletal, gastrointestinal, neurological and psychiatric system(s) were reviewed and pertinent findings if present are noted.    Vitals Vital Signs [Data Includes: Last 1 Day]  Recorded: 02Dec2014 03:49PM  Blood Pressure: 114 / 79 Temperature: 97.4 F Heart Rate: 81 Respiration: 20  Physical Exam Constitutional: Well nourished and well developed . No acute distress.  ENT:. The ears and nose are normal in appearance.  Neck: The appearance of the neck is normal and no neck mass is present.  Pulmonary: No respiratory distress and normal respiratory rhythm and effort.  Cardiovascular: Heart rate and rhythm are normal . No peripheral edema.  Abdomen: The abdomen is soft and nontender. No masses are palpated. No CVA tenderness. No hernias are palpable. No hepatosplenomegaly noted.  Genitourinary: Examination of the penis demonstrates no discharge, no masses, no lesions and a normal meatus. The scrotum is without lesions. The right epididymis is palpably normal and non-tender. The left epididymis is palpably normal and non-tender. The right testis is non-tender and without masses. The left testis is non-tender and without masses.  Lymphatics: The femoral and inguinal nodes are not enlarged or tender.  Skin: Normal skin turgor, no visible rash and no visible skin lesions.  Neuro/Psych:. Mood and affect are appropriate.    Assessment Assessed  1. Calculus of left ureter (592.1) 2. Urinary retention (788.20) 3. Benign localized prostatic hyperplasia with lower urinary tract symptoms (LUTS)   (600.21,599.69)  Plan Cystoscopy, left retrograde pyelogram, ureteroscopy, holmium laser ureteral stone, stone manipulation, JJ stent.

## 2013-01-06 NOTE — Anesthesia Procedure Notes (Signed)
Procedure Name: LMA Insertion Date/Time: 01/06/2013 11:56 AM Performed by: Renella Cunas D Pre-anesthesia Checklist: Patient identified, Emergency Drugs available, Suction available and Patient being monitored Patient Re-evaluated:Patient Re-evaluated prior to inductionOxygen Delivery Method: Circle System Utilized Preoxygenation: Pre-oxygenation with 100% oxygen Intubation Type: IV induction Ventilation: Mask ventilation without difficulty LMA: LMA inserted LMA Size: 4.0 Number of attempts: 1 Airway Equipment and Method: bite block Placement Confirmation: positive ETCO2 Tube secured with: Tape Dental Injury: Teeth and Oropharynx as per pre-operative assessment

## 2013-01-06 NOTE — Anesthesia Preprocedure Evaluation (Signed)
Anesthesia Evaluation  Patient identified by MRN, date of birth, ID band Patient awake    Reviewed: Allergy & Precautions, H&P , NPO status , Patient's Chart, lab work & pertinent test results  Airway Mallampati: II TM Distance: >3 FB Neck ROM: Full    Dental  (+) Teeth Intact and Dental Advisory Given   Pulmonary neg pulmonary ROS,  breath sounds clear to auscultation  Pulmonary exam normal       Cardiovascular negative cardio ROS  Rhythm:Regular Rate:Normal     Neuro/Psych Prior Deep Brain Stimulator implant for severe Parkinsons negative neurological ROS  negative psych ROS   GI/Hepatic negative GI ROS, Neg liver ROS,   Endo/Other  negative endocrine ROS  Renal/GU Renal diseasePercutaneous nephrostomy 12/16/12  negative genitourinary   Musculoskeletal negative musculoskeletal ROS (+)   Abdominal   Peds  Hematology negative hematology ROS (+)   Anesthesia Other Findings   Reproductive/Obstetrics                           Anesthesia Physical Anesthesia Plan  ASA: III  Anesthesia Plan: General   Post-op Pain Management:    Induction: Intravenous  Airway Management Planned: LMA  Additional Equipment:   Intra-op Plan:   Post-operative Plan: Extubation in OR  Informed Consent: I have reviewed the patients History and Physical, chart, labs and discussed the procedure including the risks, benefits and alternatives for the proposed anesthesia with the patient or authorized representative who has indicated his/her understanding and acceptance.   Dental advisory given  Plan Discussed with: CRNA  Anesthesia Plan Comments:         Anesthesia Quick Evaluation

## 2013-01-07 NOTE — Anesthesia Postprocedure Evaluation (Signed)
Anesthesia Post Note  Patient: [name] [name]  Procedure(s) Performed: Procedure(s) (LRB): CYSTOSCOPY WITH RETROGRADE PYELOGRAM, URETEROSCOPY AND STENT PLACEMENT (Left) HOLMIUM LASER APPLICATION (Left) STONE EXTRACTION WITH BASKET (Left)  Anesthesia type: General  Patient location: PACU  Post pain: Pain level controlled  Post assessment: Post-op Vital signs reviewed  Last Vitals:  Filed Vitals:   01/06/13 1520  BP: 105/53  Pulse: 79  Temp: 36.3 C  Resp: 18    Post vital signs: Reviewed  Level of consciousness: sedated  Complications: No apparent anesthesia complications

## 2013-01-09 ENCOUNTER — Encounter (HOSPITAL_BASED_OUTPATIENT_CLINIC_OR_DEPARTMENT_OTHER): Payer: Self-pay | Admitting: Urology

## 2013-01-09 DIAGNOSIS — R339 Retention of urine, unspecified: Secondary | ICD-10-CM | POA: Diagnosis not present

## 2013-01-09 DIAGNOSIS — N390 Urinary tract infection, site not specified: Secondary | ICD-10-CM | POA: Diagnosis not present

## 2013-01-10 DIAGNOSIS — N400 Enlarged prostate without lower urinary tract symptoms: Secondary | ICD-10-CM | POA: Diagnosis not present

## 2013-01-10 DIAGNOSIS — N10 Acute tubulo-interstitial nephritis: Secondary | ICD-10-CM | POA: Diagnosis not present

## 2013-01-10 DIAGNOSIS — G20 Parkinson's disease: Secondary | ICD-10-CM | POA: Diagnosis not present

## 2013-01-10 DIAGNOSIS — Z466 Encounter for fitting and adjustment of urinary device: Secondary | ICD-10-CM | POA: Diagnosis not present

## 2013-01-10 DIAGNOSIS — Z436 Encounter for attention to other artificial openings of urinary tract: Secondary | ICD-10-CM | POA: Diagnosis not present

## 2013-01-10 DIAGNOSIS — R339 Retention of urine, unspecified: Secondary | ICD-10-CM | POA: Diagnosis not present

## 2013-01-11 DIAGNOSIS — G20 Parkinson's disease: Secondary | ICD-10-CM | POA: Diagnosis not present

## 2013-01-11 DIAGNOSIS — N400 Enlarged prostate without lower urinary tract symptoms: Secondary | ICD-10-CM | POA: Diagnosis not present

## 2013-01-11 DIAGNOSIS — Z466 Encounter for fitting and adjustment of urinary device: Secondary | ICD-10-CM | POA: Diagnosis not present

## 2013-01-11 DIAGNOSIS — Z436 Encounter for attention to other artificial openings of urinary tract: Secondary | ICD-10-CM | POA: Diagnosis not present

## 2013-01-11 DIAGNOSIS — R339 Retention of urine, unspecified: Secondary | ICD-10-CM | POA: Diagnosis not present

## 2013-01-11 DIAGNOSIS — N10 Acute tubulo-interstitial nephritis: Secondary | ICD-10-CM | POA: Diagnosis not present

## 2013-01-12 DIAGNOSIS — N400 Enlarged prostate without lower urinary tract symptoms: Secondary | ICD-10-CM | POA: Diagnosis not present

## 2013-01-12 DIAGNOSIS — R339 Retention of urine, unspecified: Secondary | ICD-10-CM | POA: Diagnosis not present

## 2013-01-12 DIAGNOSIS — N10 Acute tubulo-interstitial nephritis: Secondary | ICD-10-CM | POA: Diagnosis not present

## 2013-01-12 DIAGNOSIS — G20 Parkinson's disease: Secondary | ICD-10-CM | POA: Diagnosis not present

## 2013-01-12 DIAGNOSIS — Z436 Encounter for attention to other artificial openings of urinary tract: Secondary | ICD-10-CM | POA: Diagnosis not present

## 2013-01-12 DIAGNOSIS — Z466 Encounter for fitting and adjustment of urinary device: Secondary | ICD-10-CM | POA: Diagnosis not present

## 2013-01-17 DIAGNOSIS — Z466 Encounter for fitting and adjustment of urinary device: Secondary | ICD-10-CM | POA: Diagnosis not present

## 2013-01-17 DIAGNOSIS — N10 Acute tubulo-interstitial nephritis: Secondary | ICD-10-CM | POA: Diagnosis not present

## 2013-01-17 DIAGNOSIS — N401 Enlarged prostate with lower urinary tract symptoms: Secondary | ICD-10-CM | POA: Diagnosis not present

## 2013-01-17 DIAGNOSIS — G20 Parkinson's disease: Secondary | ICD-10-CM | POA: Diagnosis not present

## 2013-01-17 DIAGNOSIS — N201 Calculus of ureter: Secondary | ICD-10-CM | POA: Diagnosis not present

## 2013-01-17 DIAGNOSIS — N400 Enlarged prostate without lower urinary tract symptoms: Secondary | ICD-10-CM | POA: Diagnosis not present

## 2013-01-17 DIAGNOSIS — Z436 Encounter for attention to other artificial openings of urinary tract: Secondary | ICD-10-CM | POA: Diagnosis not present

## 2013-01-17 DIAGNOSIS — R339 Retention of urine, unspecified: Secondary | ICD-10-CM | POA: Diagnosis not present

## 2013-01-17 DIAGNOSIS — N139 Obstructive and reflux uropathy, unspecified: Secondary | ICD-10-CM | POA: Diagnosis not present

## 2013-01-20 DIAGNOSIS — N10 Acute tubulo-interstitial nephritis: Secondary | ICD-10-CM | POA: Diagnosis not present

## 2013-01-20 DIAGNOSIS — R339 Retention of urine, unspecified: Secondary | ICD-10-CM | POA: Diagnosis not present

## 2013-01-20 DIAGNOSIS — Z466 Encounter for fitting and adjustment of urinary device: Secondary | ICD-10-CM | POA: Diagnosis not present

## 2013-01-20 DIAGNOSIS — N400 Enlarged prostate without lower urinary tract symptoms: Secondary | ICD-10-CM | POA: Diagnosis not present

## 2013-01-20 DIAGNOSIS — Z436 Encounter for attention to other artificial openings of urinary tract: Secondary | ICD-10-CM | POA: Diagnosis not present

## 2013-01-20 DIAGNOSIS — G20 Parkinson's disease: Secondary | ICD-10-CM | POA: Diagnosis not present

## 2013-01-25 DIAGNOSIS — N400 Enlarged prostate without lower urinary tract symptoms: Secondary | ICD-10-CM | POA: Diagnosis not present

## 2013-01-25 DIAGNOSIS — Z436 Encounter for attention to other artificial openings of urinary tract: Secondary | ICD-10-CM | POA: Diagnosis not present

## 2013-01-25 DIAGNOSIS — N10 Acute tubulo-interstitial nephritis: Secondary | ICD-10-CM | POA: Diagnosis not present

## 2013-01-25 DIAGNOSIS — Z466 Encounter for fitting and adjustment of urinary device: Secondary | ICD-10-CM | POA: Diagnosis not present

## 2013-01-25 DIAGNOSIS — G20 Parkinson's disease: Secondary | ICD-10-CM | POA: Diagnosis not present

## 2013-01-25 DIAGNOSIS — R339 Retention of urine, unspecified: Secondary | ICD-10-CM | POA: Diagnosis not present

## 2013-01-27 DIAGNOSIS — Z436 Encounter for attention to other artificial openings of urinary tract: Secondary | ICD-10-CM | POA: Diagnosis not present

## 2013-01-27 DIAGNOSIS — N400 Enlarged prostate without lower urinary tract symptoms: Secondary | ICD-10-CM | POA: Diagnosis not present

## 2013-01-27 DIAGNOSIS — N10 Acute tubulo-interstitial nephritis: Secondary | ICD-10-CM | POA: Diagnosis not present

## 2013-01-27 DIAGNOSIS — R339 Retention of urine, unspecified: Secondary | ICD-10-CM | POA: Diagnosis not present

## 2013-01-27 DIAGNOSIS — G20 Parkinson's disease: Secondary | ICD-10-CM | POA: Diagnosis not present

## 2013-01-27 DIAGNOSIS — Z466 Encounter for fitting and adjustment of urinary device: Secondary | ICD-10-CM | POA: Diagnosis not present

## 2013-01-31 DIAGNOSIS — Z466 Encounter for fitting and adjustment of urinary device: Secondary | ICD-10-CM | POA: Diagnosis not present

## 2013-01-31 DIAGNOSIS — N10 Acute tubulo-interstitial nephritis: Secondary | ICD-10-CM | POA: Diagnosis not present

## 2013-01-31 DIAGNOSIS — G20 Parkinson's disease: Secondary | ICD-10-CM | POA: Diagnosis not present

## 2013-01-31 DIAGNOSIS — N139 Obstructive and reflux uropathy, unspecified: Secondary | ICD-10-CM | POA: Diagnosis not present

## 2013-01-31 DIAGNOSIS — N201 Calculus of ureter: Secondary | ICD-10-CM | POA: Diagnosis not present

## 2013-01-31 DIAGNOSIS — N400 Enlarged prostate without lower urinary tract symptoms: Secondary | ICD-10-CM | POA: Diagnosis not present

## 2013-01-31 DIAGNOSIS — Z436 Encounter for attention to other artificial openings of urinary tract: Secondary | ICD-10-CM | POA: Diagnosis not present

## 2013-01-31 DIAGNOSIS — N401 Enlarged prostate with lower urinary tract symptoms: Secondary | ICD-10-CM | POA: Diagnosis not present

## 2013-01-31 DIAGNOSIS — R339 Retention of urine, unspecified: Secondary | ICD-10-CM | POA: Diagnosis not present

## 2013-02-01 DIAGNOSIS — N400 Enlarged prostate without lower urinary tract symptoms: Secondary | ICD-10-CM | POA: Diagnosis not present

## 2013-02-01 DIAGNOSIS — G20 Parkinson's disease: Secondary | ICD-10-CM | POA: Diagnosis not present

## 2013-02-01 DIAGNOSIS — Z466 Encounter for fitting and adjustment of urinary device: Secondary | ICD-10-CM | POA: Diagnosis not present

## 2013-02-01 DIAGNOSIS — N10 Acute tubulo-interstitial nephritis: Secondary | ICD-10-CM | POA: Diagnosis not present

## 2013-02-01 DIAGNOSIS — R339 Retention of urine, unspecified: Secondary | ICD-10-CM | POA: Diagnosis not present

## 2013-02-01 DIAGNOSIS — Z436 Encounter for attention to other artificial openings of urinary tract: Secondary | ICD-10-CM | POA: Diagnosis not present

## 2013-05-17 DIAGNOSIS — D1801 Hemangioma of skin and subcutaneous tissue: Secondary | ICD-10-CM | POA: Diagnosis not present

## 2013-05-17 DIAGNOSIS — L821 Other seborrheic keratosis: Secondary | ICD-10-CM | POA: Diagnosis not present

## 2013-05-17 DIAGNOSIS — Z808 Family history of malignant neoplasm of other organs or systems: Secondary | ICD-10-CM | POA: Diagnosis not present

## 2013-07-31 DIAGNOSIS — N401 Enlarged prostate with lower urinary tract symptoms: Secondary | ICD-10-CM | POA: Diagnosis not present

## 2013-07-31 DIAGNOSIS — N139 Obstructive and reflux uropathy, unspecified: Secondary | ICD-10-CM | POA: Diagnosis not present

## 2013-08-01 DIAGNOSIS — H2510 Age-related nuclear cataract, unspecified eye: Secondary | ICD-10-CM | POA: Diagnosis not present

## 2014-03-06 DIAGNOSIS — R351 Nocturia: Secondary | ICD-10-CM | POA: Diagnosis not present

## 2014-03-06 DIAGNOSIS — N200 Calculus of kidney: Secondary | ICD-10-CM | POA: Diagnosis not present

## 2014-03-06 DIAGNOSIS — N401 Enlarged prostate with lower urinary tract symptoms: Secondary | ICD-10-CM | POA: Diagnosis not present

## 2014-04-05 DIAGNOSIS — F411 Generalized anxiety disorder: Secondary | ICD-10-CM | POA: Diagnosis not present

## 2014-04-05 DIAGNOSIS — G4752 REM sleep behavior disorder: Secondary | ICD-10-CM | POA: Diagnosis not present

## 2014-04-05 DIAGNOSIS — G20 Parkinson's disease: Secondary | ICD-10-CM | POA: Diagnosis not present

## 2014-05-21 DIAGNOSIS — D1801 Hemangioma of skin and subcutaneous tissue: Secondary | ICD-10-CM | POA: Diagnosis not present

## 2014-05-21 DIAGNOSIS — L821 Other seborrheic keratosis: Secondary | ICD-10-CM | POA: Diagnosis not present

## 2014-05-21 DIAGNOSIS — Z808 Family history of malignant neoplasm of other organs or systems: Secondary | ICD-10-CM | POA: Diagnosis not present

## 2014-05-25 DIAGNOSIS — J069 Acute upper respiratory infection, unspecified: Secondary | ICD-10-CM | POA: Diagnosis not present

## 2014-05-25 DIAGNOSIS — J188 Other pneumonia, unspecified organism: Secondary | ICD-10-CM | POA: Diagnosis not present

## 2014-05-25 DIAGNOSIS — J029 Acute pharyngitis, unspecified: Secondary | ICD-10-CM | POA: Diagnosis not present

## 2014-06-12 DIAGNOSIS — R1032 Left lower quadrant pain: Secondary | ICD-10-CM | POA: Diagnosis not present

## 2014-06-12 DIAGNOSIS — N400 Enlarged prostate without lower urinary tract symptoms: Secondary | ICD-10-CM | POA: Diagnosis not present

## 2014-06-12 DIAGNOSIS — N202 Calculus of kidney with calculus of ureter: Secondary | ICD-10-CM | POA: Diagnosis not present

## 2014-06-12 DIAGNOSIS — R110 Nausea: Secondary | ICD-10-CM | POA: Diagnosis not present

## 2014-06-12 DIAGNOSIS — K8020 Calculus of gallbladder without cholecystitis without obstruction: Secondary | ICD-10-CM | POA: Diagnosis not present

## 2014-06-12 DIAGNOSIS — N200 Calculus of kidney: Secondary | ICD-10-CM | POA: Diagnosis not present

## 2014-07-10 DIAGNOSIS — N201 Calculus of ureter: Secondary | ICD-10-CM | POA: Diagnosis not present

## 2014-07-10 DIAGNOSIS — N200 Calculus of kidney: Secondary | ICD-10-CM | POA: Diagnosis not present

## 2014-08-07 DIAGNOSIS — H02832 Dermatochalasis of right lower eyelid: Secondary | ICD-10-CM | POA: Diagnosis not present

## 2014-08-07 DIAGNOSIS — H02834 Dermatochalasis of left upper eyelid: Secondary | ICD-10-CM | POA: Diagnosis not present

## 2014-08-07 DIAGNOSIS — H02831 Dermatochalasis of right upper eyelid: Secondary | ICD-10-CM | POA: Diagnosis not present

## 2014-08-07 DIAGNOSIS — H52223 Regular astigmatism, bilateral: Secondary | ICD-10-CM | POA: Diagnosis not present

## 2014-08-07 DIAGNOSIS — H2513 Age-related nuclear cataract, bilateral: Secondary | ICD-10-CM | POA: Diagnosis not present

## 2014-08-07 DIAGNOSIS — H524 Presbyopia: Secondary | ICD-10-CM | POA: Diagnosis not present

## 2014-08-07 DIAGNOSIS — H04123 Dry eye syndrome of bilateral lacrimal glands: Secondary | ICD-10-CM | POA: Diagnosis not present

## 2014-08-07 DIAGNOSIS — H02835 Dermatochalasis of left lower eyelid: Secondary | ICD-10-CM | POA: Diagnosis not present

## 2014-08-09 DIAGNOSIS — N200 Calculus of kidney: Secondary | ICD-10-CM | POA: Diagnosis not present

## 2014-08-09 DIAGNOSIS — N201 Calculus of ureter: Secondary | ICD-10-CM | POA: Diagnosis not present

## 2014-09-04 DIAGNOSIS — N400 Enlarged prostate without lower urinary tract symptoms: Secondary | ICD-10-CM | POA: Diagnosis not present

## 2014-09-04 DIAGNOSIS — N201 Calculus of ureter: Secondary | ICD-10-CM | POA: Diagnosis not present

## 2014-09-07 ENCOUNTER — Encounter (HOSPITAL_BASED_OUTPATIENT_CLINIC_OR_DEPARTMENT_OTHER): Payer: Self-pay | Admitting: *Deleted

## 2014-09-07 ENCOUNTER — Other Ambulatory Visit: Payer: Self-pay | Admitting: Urology

## 2014-09-07 NOTE — Progress Notes (Signed)
NPO AFTER MN.  ARRIVE AT 0600. NEEDS CBC, CMET, URIC ACID, AND PARATHYROID HORMONE .  WILL TAKE ZANTAC, SINEMET, AND LEXAPRO AM DOS W/ SIPS OF WATER.

## 2014-09-11 ENCOUNTER — Ambulatory Visit (HOSPITAL_BASED_OUTPATIENT_CLINIC_OR_DEPARTMENT_OTHER): Payer: Medicare Other | Admitting: Anesthesiology

## 2014-09-11 ENCOUNTER — Encounter (HOSPITAL_BASED_OUTPATIENT_CLINIC_OR_DEPARTMENT_OTHER): Payer: Self-pay | Admitting: *Deleted

## 2014-09-11 ENCOUNTER — Ambulatory Visit (HOSPITAL_BASED_OUTPATIENT_CLINIC_OR_DEPARTMENT_OTHER)
Admission: RE | Admit: 2014-09-11 | Discharge: 2014-09-11 | Disposition: A | Discharge status: Home / Self Care | Payer: Medicare Other | Source: Ambulatory Visit | Attending: Urology | Admitting: Urology

## 2014-09-11 ENCOUNTER — Encounter (HOSPITAL_BASED_OUTPATIENT_CLINIC_OR_DEPARTMENT_OTHER)
Admission: RE | Disposition: A | Discharge status: Home / Self Care | Payer: Self-pay | Source: Ambulatory Visit | Attending: Urology

## 2014-09-11 DIAGNOSIS — Z87442 Personal history of urinary calculi: Secondary | ICD-10-CM | POA: Insufficient documentation

## 2014-09-11 DIAGNOSIS — G20 Parkinson's disease: Secondary | ICD-10-CM | POA: Insufficient documentation

## 2014-09-11 DIAGNOSIS — K219 Gastro-esophageal reflux disease without esophagitis: Secondary | ICD-10-CM | POA: Insufficient documentation

## 2014-09-11 DIAGNOSIS — N202 Calculus of kidney with calculus of ureter: Secondary | ICD-10-CM | POA: Insufficient documentation

## 2014-09-11 DIAGNOSIS — N400 Enlarged prostate without lower urinary tract symptoms: Secondary | ICD-10-CM | POA: Insufficient documentation

## 2014-09-11 DIAGNOSIS — N201 Calculus of ureter: Secondary | ICD-10-CM | POA: Diagnosis not present

## 2014-09-11 DIAGNOSIS — Z79891 Long term (current) use of opiate analgesic: Secondary | ICD-10-CM | POA: Diagnosis not present

## 2014-09-11 DIAGNOSIS — N200 Calculus of kidney: Secondary | ICD-10-CM | POA: Diagnosis not present

## 2014-09-11 DIAGNOSIS — Z79899 Other long term (current) drug therapy: Secondary | ICD-10-CM | POA: Insufficient documentation

## 2014-09-11 DIAGNOSIS — F419 Anxiety disorder, unspecified: Secondary | ICD-10-CM | POA: Insufficient documentation

## 2014-09-11 HISTORY — DX: Presence of external hearing-aid: Z97.4

## 2014-09-11 HISTORY — PX: CYSTOSCOPY WITH RETROGRADE PYELOGRAM, URETEROSCOPY AND STENT PLACEMENT: SHX5789

## 2014-09-11 HISTORY — PX: HOLMIUM LASER APPLICATION: SHX5852

## 2014-09-11 HISTORY — DX: Presence of spectacles and contact lenses: Z97.3

## 2014-09-11 HISTORY — DX: Calculus of kidney: N20.0

## 2014-09-11 HISTORY — DX: Gastro-esophageal reflux disease without esophagitis: K21.9

## 2014-09-11 LAB — COMPREHENSIVE METABOLIC PANEL
ALBUMIN: 3.7 g/dL (ref 3.5–5.0)
ALK PHOS: 61 U/L (ref 38–126)
ALT: 5 U/L — ABNORMAL LOW (ref 17–63)
AST: 15 U/L (ref 15–41)
Anion gap: 6 (ref 5–15)
BILIRUBIN TOTAL: 0.9 mg/dL (ref 0.3–1.2)
BUN: 17 mg/dL (ref 6–20)
CO2: 26 mmol/L (ref 22–32)
Calcium: 8.7 mg/dL — ABNORMAL LOW (ref 8.9–10.3)
Chloride: 105 mmol/L (ref 101–111)
Creatinine, Ser: 0.99 mg/dL (ref 0.61–1.24)
GFR calc Af Amer: >60 mL/min (ref >60)
GLUCOSE: 99 mg/dL (ref 65–99)
Potassium: 4 mmol/L (ref 3.5–5.1)
Sodium: 137 mmol/L (ref 135–145)
Total Protein: 6.4 g/dL — ABNORMAL LOW (ref 6.5–8.1)

## 2014-09-11 LAB — CBC
HCT: 42.2 % (ref 39.0–52.0)
HEMOGLOBIN: 14 g/dL (ref 13.0–17.0)
MCH: 30.6 pg (ref 26.0–34.0)
MCHC: 33.2 g/dL (ref 30.0–36.0)
MCV: 92.3 fL (ref 78.0–100.0)
PLATELETS: 291 10*3/uL (ref 150–400)
RBC: 4.57 MIL/uL (ref 4.22–5.81)
RDW: 12.2 % (ref 11.5–15.5)
WBC: 5.8 10*3/uL (ref 4.0–10.5)

## 2014-09-11 LAB — URIC ACID: Uric Acid, Serum: 3.5 mg/dL — ABNORMAL LOW (ref 4.4–7.6)

## 2014-09-11 SURGERY — CYSTOURETEROSCOPY, WITH RETROGRADE PYELOGRAM AND STENT INSERTION
Anesthesia: General | Site: Ureter | Laterality: Left

## 2014-09-11 MED ORDER — PROPOFOL 10 MG/ML IV BOLUS
INTRAVENOUS | Status: DC | PRN
  Administered 2014-09-11: 120 mg via INTRAVENOUS

## 2014-09-11 MED ORDER — FENTANYL CITRATE (PF) 100 MCG/2ML IJ SOLN
INTRAMUSCULAR | Status: DC | PRN
  Administered 2014-09-11 (×3): 25 ug via INTRAVENOUS

## 2014-09-11 MED ORDER — SODIUM CHLORIDE 0.9 % IR SOLN
Status: DC | PRN
  Administered 2014-09-11: 4000 mL

## 2014-09-11 MED ORDER — HYDROCODONE-ACETAMINOPHEN 5-325 MG PO TABS: 1.0000 | ORAL_TABLET | Freq: Four times a day (QID) | ORAL | Status: DC | PRN

## 2014-09-11 MED ORDER — LACTATED RINGERS IV SOLN
INTRAVENOUS | Status: DC
  Administered 2014-09-11: 06:00:00 via INTRAVENOUS
  Filled 2014-09-11: qty 1000

## 2014-09-11 MED ORDER — FENTANYL CITRATE (PF) 100 MCG/2ML IJ SOLN
INTRAMUSCULAR | Status: AC
  Filled 2014-09-11: qty 4

## 2014-09-11 MED ORDER — FENTANYL CITRATE (PF) 100 MCG/2ML IJ SOLN
25.0000 ug | INTRAMUSCULAR | Status: DC | PRN
Start: 2014-09-11 — End: 2014-09-11
  Filled 2014-09-11: qty 1

## 2014-09-11 MED ORDER — DEXAMETHASONE SODIUM PHOSPHATE 4 MG/ML IJ SOLN
INTRAMUSCULAR | Status: DC | PRN
  Administered 2014-09-11: 5 mg via INTRAVENOUS

## 2014-09-11 MED ORDER — ONDANSETRON HCL 4 MG/2ML IJ SOLN
INTRAMUSCULAR | Status: DC | PRN
  Administered 2014-09-11: 4 mg via INTRAVENOUS

## 2014-09-11 MED ORDER — CEFAZOLIN SODIUM-DEXTROSE 2-3 GM-% IV SOLR
INTRAVENOUS | Status: AC
  Filled 2014-09-11: qty 50

## 2014-09-11 MED ORDER — LIDOCAINE HCL (CARDIAC) 20 MG/ML IV SOLN
INTRAVENOUS | Status: DC | PRN
  Administered 2014-09-11: 60 mg via INTRAVENOUS

## 2014-09-11 MED ORDER — CEFAZOLIN SODIUM-DEXTROSE 2-3 GM-% IV SOLR
2.0000 g | INTRAVENOUS | Status: AC
  Administered 2014-09-11: 2 g via INTRAVENOUS
  Filled 2014-09-11: qty 50

## 2014-09-11 MED ORDER — IOHEXOL 350 MG/ML SOLN
INTRAVENOUS | Status: DC | PRN
  Administered 2014-09-11: 16 mL

## 2014-09-11 MED ORDER — CEFAZOLIN SODIUM 1-5 GM-% IV SOLN
1.0000 g | INTRAVENOUS | Status: DC
  Filled 2014-09-11: qty 50

## 2014-09-11 SURGICAL SUPPLY — 37 items
ADAPTER CATH URET PLST 4-6FR (CATHETERS) IMPLANT
ADPR CATH URET STRL DISP 4-6FR (CATHETERS)
BAG DRAIN URO-CYSTO SKYTR STRL (DRAIN) ×2 IMPLANT
BAG DRN UROCATH (DRAIN) ×1
BASKET LASER NITINOL 1.9FR (BASKET) IMPLANT
BASKET STNLS GEMINI 4WIRE 3FR (BASKET) IMPLANT
BASKET ZERO TIP NITINOL 2.4FR (BASKET) ×2 IMPLANT
BSKT STON RTRVL 120 1.9FR (BASKET)
BSKT STON RTRVL GEM 120X11 3FR (BASKET)
BSKT STON RTRVL ZERO TP 2.4FR (BASKET) ×1
CANISTER SUCT LVC 12 LTR MEDI- (MISCELLANEOUS) IMPLANT
CATH INTERMIT  6FR 70CM (CATHETERS) ×2 IMPLANT
CATH URET 5FR 28IN CONE TIP (BALLOONS) ×1
CATH URET 5FR 28IN OPEN ENDED (CATHETERS) IMPLANT
CATH URET 5FR 70CM CONE TIP (BALLOONS) ×1 IMPLANT
CLOTH BEACON ORANGE TIMEOUT ST (SAFETY) ×2 IMPLANT
FIBER LASER FLEXIVA 1000 (UROLOGICAL SUPPLIES) IMPLANT
FIBER LASER FLEXIVA 365 (UROLOGICAL SUPPLIES) IMPLANT
FIBER LASER FLEXIVA 550 (UROLOGICAL SUPPLIES) IMPLANT
FIBER LASER TRAC TIP (UROLOGICAL SUPPLIES) ×2 IMPLANT
GLOVE BIO SURGEON STRL SZ 6.5 (GLOVE) ×2 IMPLANT
GLOVE BIO SURGEON STRL SZ7 (GLOVE) ×2 IMPLANT
GLOVE BIOGEL PI IND STRL 6.5 (GLOVE) ×2 IMPLANT
GLOVE BIOGEL PI INDICATOR 6.5 (GLOVE) ×2
GOWN STRL REUS W/TWL LRG LVL3 (GOWN DISPOSABLE) ×4 IMPLANT
GUIDEWIRE 0.038 PTFE COATED (WIRE) IMPLANT
GUIDEWIRE ANG ZIPWIRE 038X150 (WIRE) IMPLANT
GUIDEWIRE STR DUAL SENSOR (WIRE) ×2 IMPLANT
KIT BALLIN UROMAX 15FX10 (LABEL) IMPLANT
KIT BALLN UROMAX 15FX4 (MISCELLANEOUS) IMPLANT
KIT BALLN UROMAX 26 75X4 (MISCELLANEOUS)
MANIFOLD NEPTUNE II (INSTRUMENTS) ×2 IMPLANT
NS IRRIG 500ML POUR BTL (IV SOLUTION) IMPLANT
PACK CYSTO (CUSTOM PROCEDURE TRAY) ×2 IMPLANT
SET HIGH PRES BAL DIL (LABEL)
SHEATH ACCESS URETERAL 24CM (SHEATH) ×2 IMPLANT
STENT URET 6FRX24 CONTOUR (STENTS) ×2 IMPLANT

## 2014-09-11 NOTE — H&P (Signed)
History of Present Illness [name] [name] returns for follow-up. He has not passed a stone. He has mild left lower quadrant discomfort on and off. He is known to have bilateral renal stones and a 2 mm stone at the left UVJ. I could not see a ureteral stone on KUB at his last visit. He is on Uroxatral. CT scan pelvis showed the left distal ureteral stone in the same location.   Past Medical History Problems  1. History of Anxiety (F41.9) 2. History of Benign prostatic hypertrophy without lower urinary tract symptoms (N40.0) 3. 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 History of Urinary retention (R33.9)  Surgical History Problems  1. History of Cystoscopy With Insertion Of Ureteral Stent 2. History of Cystoscopy With Insertion Of Ureteral Stent Left 3. History of Cystoscopy With Insertion Of Ureteral Stent Left 4. History of Cystoscopy With Ureteroscopy With Lithotripsy 5. History of Cystoscopy With Ureteroscopy With Lithotripsy 6. History of Cystoscopy With Ureteroscopy With Manipulation Of Calculus 7. History of Lithotripsy 8. History of Lithotripsy  Current Meds 1. Acetaminophen TABS;  Therapy: (Recorded:10May2016) to Recorded 2. Alfuzosin HCl ER 10 MG Oral Tablet Extended Release 24 Hour; Take 1 tablet by mouth  every day; Last Rx:19Jul2016 Ordered 3. Alfuzosin HCl ER 10 MG Oral Tablet Extended Release 24 Hour; TAKE ONE TABLET BY  MOUTH DAILY;  Therapy: [id number] to (Evaluate:16Oct2016)  Requested for: [phone]; Last  Rx:18Jul2016 Ordered 4. ALPRAZolam 0.25 MG Oral Tablet;  Therapy: (Recorded:10May2016) to Recorded 5. Carbidopa-Levodopa 25-100 MG Oral Tablet;  Therapy: (Recorded:10May2016) to Recorded 6. ClonazePAM 2 MG Oral Tablet;  Therapy: (Recorded:10May2016) to Recorded 7. Escitalopram Oxalate 20 MG Oral Tablet;  Therapy: (Recorded:10May2016) to Recorded 8. Hydrocodone-Acetaminophen 5-325 MG Oral Tablet; Take 1-2 tablets every 4-6  hours for  pain;  Therapy: [id number] to (Last Rx:10May2016) Ordered 9. Multi Vitamin Daily Oral Tablet;  Therapy: (Recorded:10May2016) to Recorded 10. Neupro 4 MG/24HR Transdermal Patch 24 Hour;   Therapy: (Recorded:10May2016) to Recorded 11. Promethazine HCl - 25 MG Oral Tablet; May take 0.5 to 1 tablet Q 6hrs nausea/vomiting;   Therapy: [id number] to (Last Rx:10May2016)  Requested for: [id number] Ordered  Allergies Medication  1. Tamsulosin HCl CAPS  Family History Problems  1. Family history of Brain Cancer : Father 2. Family history of Father Deceased At Age ____   48 cancer 24. Family history of Lung Cancer 4. Family history of Mother Deceased At Age ____   15 stroke 38. Family history of Rheumatoid Arthritis : Mother 66. Family history of Transient Ischemic Attack : Father  Social History Problems  1. Denied: History of Alcohol Use 2. Caffeine Use   2 3. Marital History - Currently Married 4. Never A Smoker  Review of Systems Genitourinary, constitutional, skin, eye, otolaryngeal, hematologic/lymphatic, cardiovascular, pulmonary, endocrine, musculoskeletal, gastrointestinal, neurological and psychiatric system(s) were reviewed and pertinent findings if present are noted and are otherwise negative.  Gastrointestinal: abdominal pain.  Neurological: difficulty walking.    Physical Exam Constitutional: Well nourished and well developed . No acute distress.  ENT:. The ears and nose are normal in appearance.  Neck: The appearance of the neck is normal and no neck mass is present.  Pulmonary: No respiratory distress and normal respiratory rhythm and effort.  Cardiovascular: Heart rate and rhythm are normal . No peripheral edema.  Abdomen: The abdomen is soft and nontender. No masses are palpated. No CVA tenderness. No hernias are palpable. No hepatosplenomegaly noted.  Genitourinary: Examination of the penis demonstrates no  discharge, no masses, no lesions and a normal  meatus. The scrotum is without lesions. The right epididymis is palpably normal and non-tender. The left epididymis is palpably normal and non-tender. The right testis is non-tender and without masses. The left testis is non-tender and without masses.  Lymphatics: The femoral and inguinal nodes are not enlarged or tender.  Skin: Normal skin turgor, no visible rash and no visible skin lesions.  Neuro/Psych:. Mood and affect are appropriate.    Results/Data Urine [Data Includes: Last 1 Day]   02Aug2016  COLOR YELLOW   APPEARANCE CLEAR   SPECIFIC GRAVITY 1.030   pH 5.5   GLUCOSE NEGATIVE   BILIRUBIN NEGATIVE   KETONE NEGATIVE   BLOOD NEGATIVE   PROTEIN NEGATIVE   NITRITE NEGATIVE   LEUKOCYTE ESTERASE NEGATIVE    I independently reviewed the CT scan and the findings are as noted above.   Assessment Assessed  1. Calculus of left ureter (N20.1)  Plan Calculus of left ureter  1. Follow-up Schedule Surgery Office  Follow-up  Status: Hold For - Appointment   Requested for: 02Aug2016 2. CT-PELVIS W/O CONTRAST; Status:Resulted - Requires Verification;   Done: 02Aug2016  12:00AM Health Maintenance  3. UA With REFLEX; [Do Not Release]; Status:Complete;   Done: [id number] 01:19PM  Since the patient has not passed the stone I advised him to proceed with stone manipulation. The procedure, risks, benefits were discussed with the patient and his wife. The risks include but are not limited to hemorrhage, infection, ureteral injury, inability to extract the stone. They understand and wish to proceed.

## 2014-09-11 NOTE — Anesthesia Postprocedure Evaluation (Signed)
  Anesthesia Post-op Note  Patient: [name] [name]  Procedure(s) Performed: Procedure(s): CYSTOSCOPY WITH LEFT RETROGRADE PYELOGRAM, URETEROSCOPY AND STENT PLACEMENT (Left) HOLMIUM LASER APPLICATION (Left)  Patient Location: PACU  Anesthesia Type:General  Level of Consciousness: awake and alert   Airway and Oxygen Therapy: Patient Spontanous Breathing  Post-op Pain: none  Post-op Assessment: Post-op Vital signs reviewed              Post-op Vital Signs: Reviewed  Last Vitals:  Filed Vitals:   09/11/14 1044  BP: 109/70  Pulse: 77  Temp: 36.5 C  Resp: 16    Complications: No apparent anesthesia complications

## 2014-09-11 NOTE — Transfer of Care (Signed)
Immediate Anesthesia Transfer of Care Note  Patient: [name] [name]  Procedure(s) Performed: Procedure(s): CYSTOSCOPY WITH LEFT RETROGRADE PYELOGRAM, URETEROSCOPY AND STENT PLACEMENT (Left) HOLMIUM LASER APPLICATION (Left)  Patient Location: PACU  Anesthesia Type:General  Level of Consciousness: awake, alert  and oriented  Airway & Oxygen Therapy: Patient Spontanous Breathing and Patient connected to nasal cannula oxygen  Post-op Assessment: Report given to RN  Post vital signs: Reviewed and stable  Last Vitals:  Filed Vitals:   09/11/14 0610  BP: 112/57  Pulse: 66  Temp: 36.8 C  Resp: 16    Complications: No apparent anesthesia complications

## 2014-09-11 NOTE — Anesthesia Preprocedure Evaluation (Addendum)
Anesthesia Evaluation  Patient identified by MRN, date of birth, ID band Patient awake    Reviewed: Allergy & Precautions, NPO status , Patient's Chart, lab work & pertinent test results  Airway Mallampati: III  TM Distance: >3 FB Neck ROM: Full    Dental   Pulmonary neg pulmonary ROS,  breath sounds clear to auscultation        Cardiovascular negative cardio ROS  Rhythm:Regular Rate:Normal     Neuro/Psych Parkinson's dz. S/p DBS.    GI/Hepatic Neg liver ROS, GERD-  ,  Endo/Other  negative endocrine ROS  Renal/GU Renal disease     Musculoskeletal   Abdominal   Peds  Hematology negative hematology ROS (+)   Anesthesia Other Findings   Reproductive/Obstetrics                            Anesthesia Physical Anesthesia Plan  ASA: III  Anesthesia Plan: General   Post-op Pain Management:    Induction: Intravenous  Airway Management Planned: LMA  Additional Equipment:   Intra-op Plan:   Post-operative Plan:   Informed Consent: I have reviewed the patients History and Physical, chart, labs and discussed the procedure including the risks, benefits and alternatives for the proposed anesthesia with the patient or authorized representative who has indicated his/her understanding and acceptance.   Dental advisory given  Plan Discussed with: CRNA  Anesthesia Plan Comments:        Anesthesia Quick Evaluation

## 2014-09-11 NOTE — Anesthesia Procedure Notes (Signed)
Procedure Name: LMA Insertion Date/Time: 09/11/2014 8:13 AM Performed by: Bethena Roys T Pre-anesthesia Checklist: Patient identified, Emergency Drugs available, Suction available and Patient being monitored Patient Re-evaluated:Patient Re-evaluated prior to inductionOxygen Delivery Method: Circle System Utilized Preoxygenation: Pre-oxygenation with 100% oxygen Intubation Type: IV induction Ventilation: Mask ventilation without difficulty LMA: LMA inserted LMA Size: 5.0 Number of attempts: 1 Airway Equipment and Method: Bite block Placement Confirmation: positive ETCO2 Tube secured with: Tape Dental Injury: Teeth and Oropharynx as per pre-operative assessment

## 2014-09-11 NOTE — Discharge Instructions (Signed)

## 2014-09-11 NOTE — Op Note (Signed)
[name] [name] is a 69 y.o.   09/11/2014  General  Preop diagnosis: Left distal ureteral calculus, bilateral renal stones.  Postop diagnosis: Same  Procedure done: Cystoscopy, left retrograde pyelogram, holmium laser lithotripsy left distal ureteral calculus, insertion of double-J stent  Surgeon: Charlene Brooke. [name] [name]  Anesthesia: Gen.  Indication: Patient is a 69 years old male who is known to have bilateral renal stones and a 2 mm stone at the left UVJ. He has been having mild left lower quadrant discomfort on and off. He has not passed the stone. Repeat CT scan of the pelvis showed the stone in the same location. He is scheduled today for cystoscopy, left retrograde pyelogram, stone manipulation.  Procedure: Patient was identified by his wrist band and proper timeout was taken.  Under general anesthesia he was prepped and draped and placed in the dorsolithotomy position. A panendoscope was inserted in the bladder. The anterior urethra is normal. He has trilobar prostatic hypertrophy. The bladder is moderately trabeculated. There is no stone or tumor in the bladder. The ureteral orifices are in normal position and shape.  Left retrograde pyelogram:  A cone-tip catheter was passed and through the cystoscope and the left ureteral orifice. Contrast was injected through the cone-tip catheter. The distal ureter appears normal. There is no evidence of filling defect in the distal ureter. The cone-tip catheter was removed.  A sensor wire was passed through a #5 [place] open-ended ureteral catheter. The sensor wire and the open-ended ureteral catheter were passed through the cystoscope and the left ureteral orifice. The sensor wire was advanced in the ureter up to the renal pelvis. The open-ended catheter was removed. The bladder was emptied and the cystoscope removed. The intramural ureter was dilated with the inner sheath of a ureteroscope access sheath. The ureteroscope access sheath was then removed. The  sensor wire was left in place as a safety wire. A semirigid ureteroscope was then in the bladder and through the left ureteral orifice. A stone was noted in the intramural ureter. It was embedded in the wall of the ureter and covered by the ureteral mucosa. I attempted to remove the stone with a Nitinol basket that I could not get the stone within the wires of the basket. Then a 200 microfiber holmium laser was passed through the ureteroscope. I was then able to incise the mucosa with the holmium laser and fragment the stone in multiple small stone fragments. The fragments were too small to be caught within the wires of the basket. He would probably be able to pass those fragments without any difficulty.  Contrast was then injected through the ureteroscope. There is no evidence of extravasation of contrast. And there is no evidence of hydroureter. The ureteroscope was then removed.  The sensor wire was backloaded into the cystoscope and #6 [id number] double-J stent was passed over the sensor wire. The proximal curl of the double-J stent is in the renal pelvis. The distal curl is in the bladder. A string was left attached to the stent. The bladder was then emptied and the cystoscope removed. The sensor wire was removed.  The patient tolerated the procedure well and left the OR in satisfactory condition to postanesthesia care unit.

## 2014-09-12 LAB — PARATHYROID HORMONE, INTACT (NO CA): PTH: 24 pg/mL (ref 15–65)

## 2014-09-13 ENCOUNTER — Encounter (HOSPITAL_BASED_OUTPATIENT_CLINIC_OR_DEPARTMENT_OTHER): Payer: Self-pay | Admitting: Urology

## 2014-09-16 IMAGING — CT CT ABD-PELV W/ CM
2 of 6 series · 16 of 46 positions shown, 18 images · IV contrast (OMNIPAQUE)
Comparison: CT scan dated 09/25/2010 and abdominal radiographs
dated 01/05/2011

CLINICAL DATA: Abdominal pain with fever and leukocytosis.
Persistent urinary tract infection. Nephrolithiasis.

EXAM:
CT ABDOMEN AND PELVIS WITH CONTRAST
TECHNIQUE: Multidetector CT imaging of the abdomen and pelvis was performed
using the standard protocol following bolus administration of
intravenous contrast.
CONTRAST:  100mL OMNIPAQUE IOHEXOL 300 MG/ML  SOLN

[Series 2: rtn a/p with · axial · 0.95mm/px · z∈[-538,-102]mm · 13 of 99 slices shown, 15 images]
[im 6/99  soft-tissue]
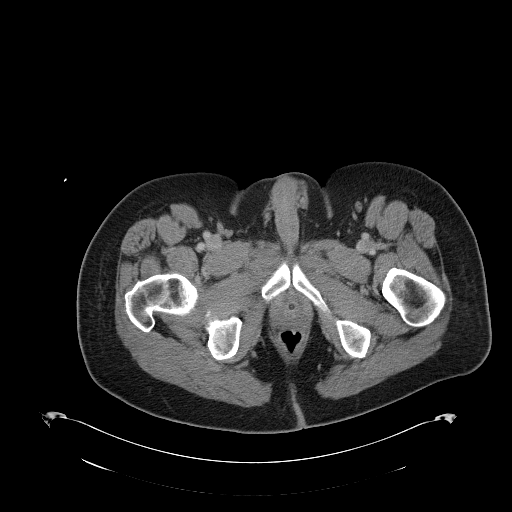
[im 6/99  bone]
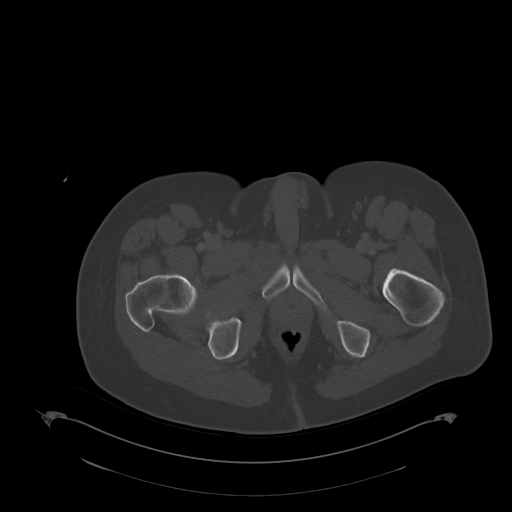
[im 11/99  soft-tissue]
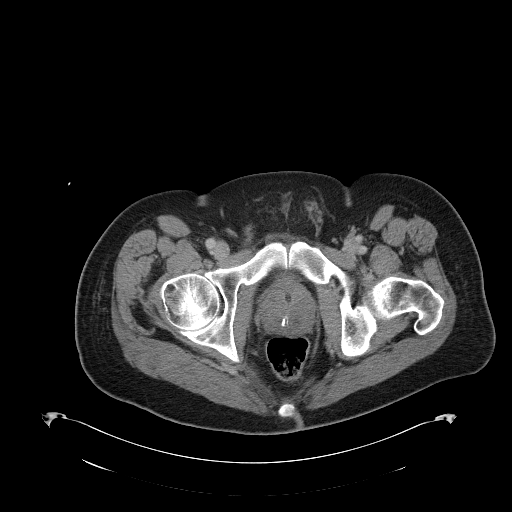
[im 22/99  soft-tissue]
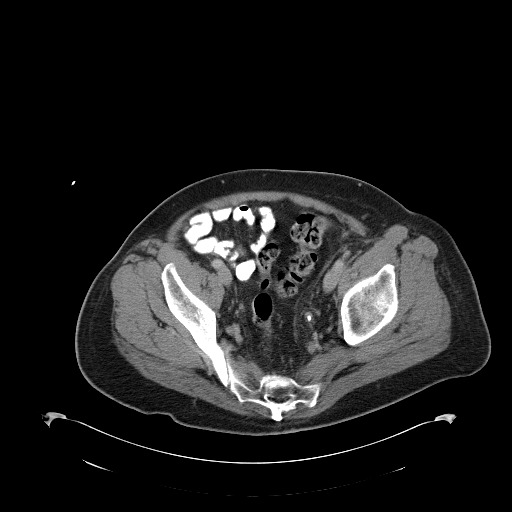
[im 28/99  soft-tissue]
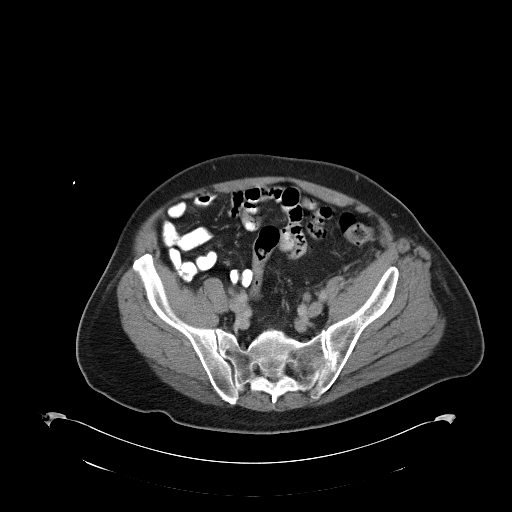
[im 33/99  soft-tissue]
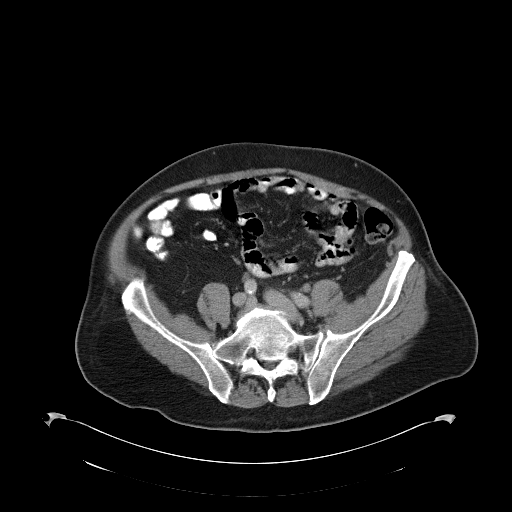
[im 44/99  soft-tissue]
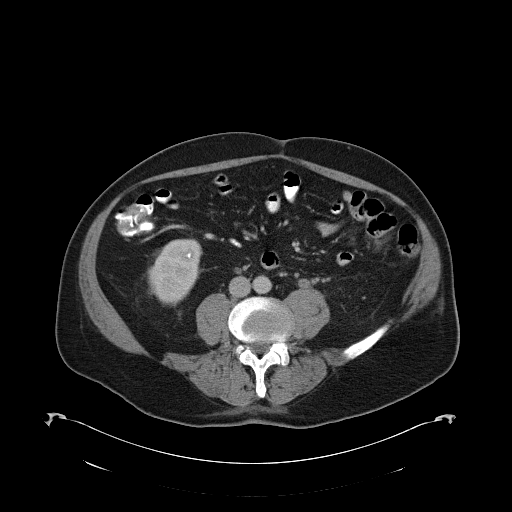
[im 50/99  soft-tissue]
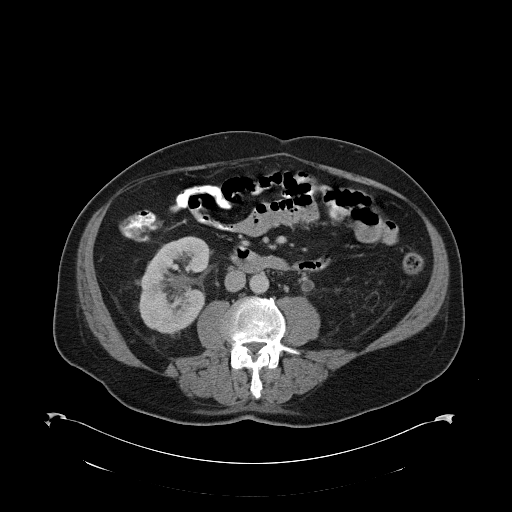
[im 55/99  soft-tissue]
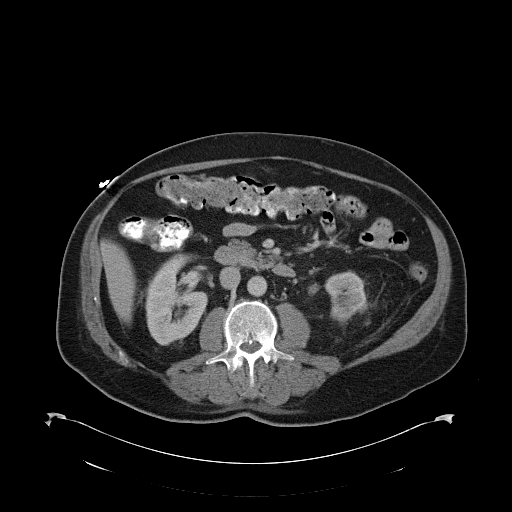
[im 66/99  soft-tissue]
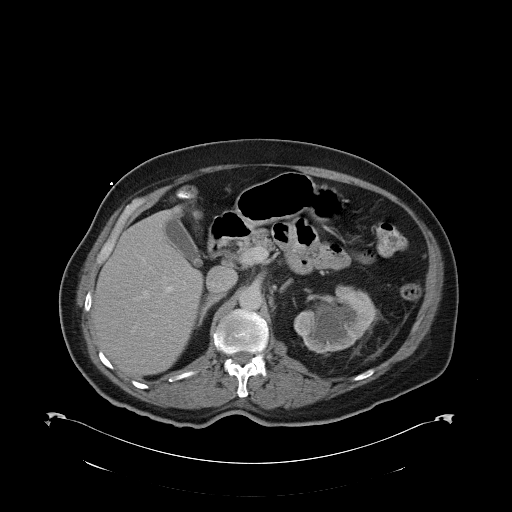
[im 66/99  bone]
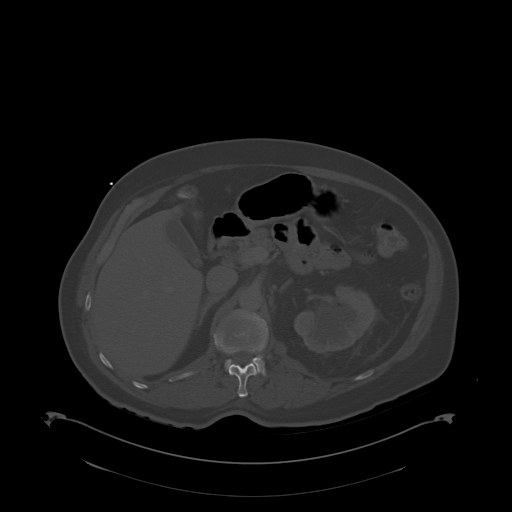
[im 71/99  soft-tissue]
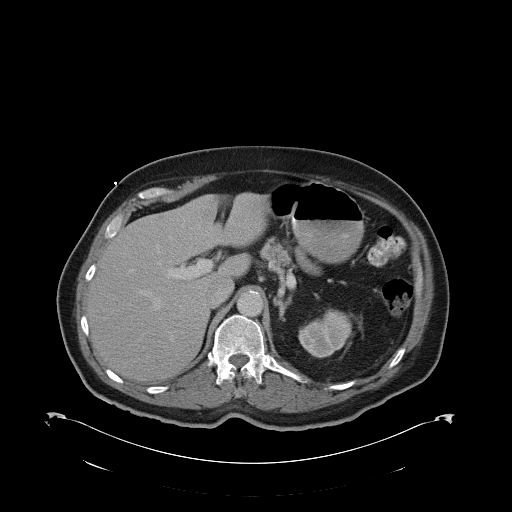
[im 77/99  soft-tissue]
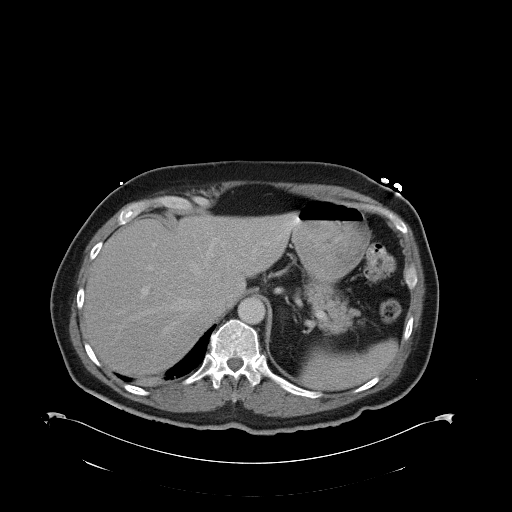
[im 88/99  soft-tissue]
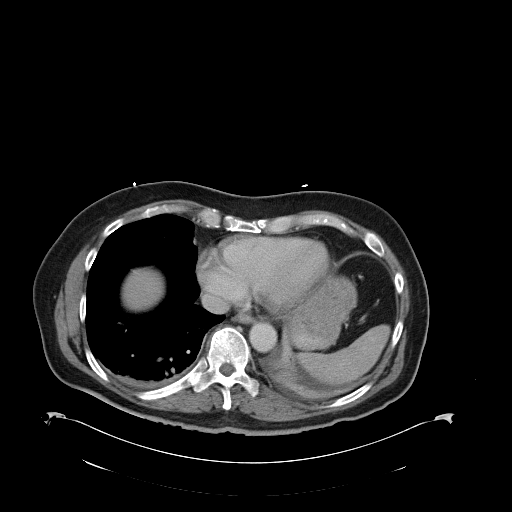
[im 93/99  soft-tissue]
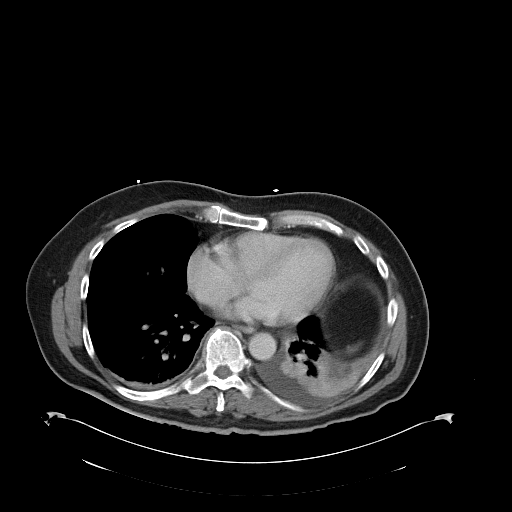

[Series 602: cor · coronal · 0.96mm/px · 3 of 125 slices shown]
[im 42/125  soft-tissue]
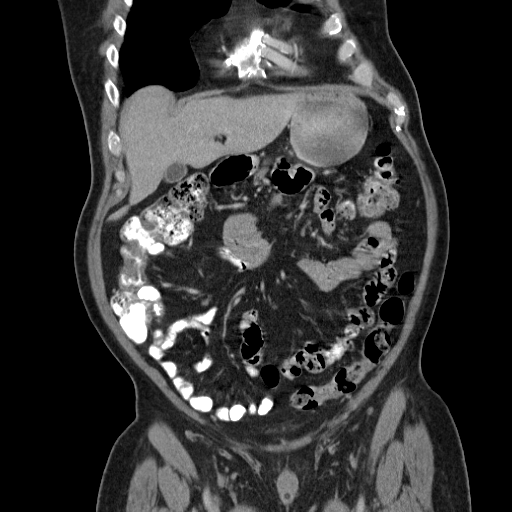
[im 56/125  soft-tissue]
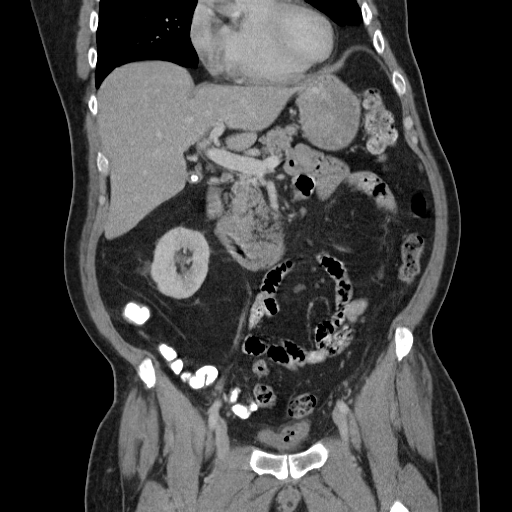
[im 69/125  soft-tissue]
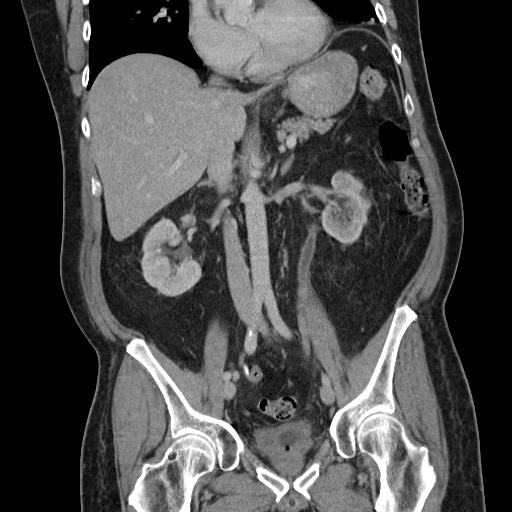

[16 of 46 positions shown; findings below may reference images not displayed]

FINDINGS: There is a small left pleural effusion with adjacent atelectasis in
the left lower lobe. Slight pleural thickening posteriorly at the
right base. Heart size is normal.

The patient has chronic left hydronephrosis with thickening of the
mucosa renal pelvis and infundibular a and left ureter consistent
with the history of persistent urinary tract infection. The patient
has numerous prominent stones in the distal left ureter over a
cm distance as well as a 4 mm stone at the left ureteral vesical
junction. There are 2 tiny stones in the posterior aspect of the
right side of the bladder near the right ureteral vesicle junction.
Right renal collecting system is not dilated. Two small stones in
the lower pole the right kidney.

There are no left renal stones. There is an irregular thick-walled
cyst in the lateral aspect of the left kidney, 3.3 cm. The
possibility of an infected cyst should be considered. There is an 8
mm cyst in the posterior aspect of the midportion of the kidney.
There is a 9 mm cyst in the anterior aspect of the mid right kidney.

Foley catheter in the bladder.

The liver, spleen, pancreas, and adrenal glands are normal. Single 7
mm stone in the gallbladder. The gallbladder is not distended. The
stone is slightly larger than on the prior exam. No significant
osseous abnormality.

The bowel appears normal including the terminal ileum and appendix.
IMPRESSION: 1. Chronic obstruction of the distal left ureter by numerous stones
including a tiny stone of the ureterovesical junction and multiple
stones over a 2.4 cm segment of the distal ureter.
2. Thickening of the mucosa of the left renal collecting system
consistent with chronic infection. Thick-walled cyst in the lateral
aspect of the left kidney may be infected as well.
3. Tiny stones in the posterior aspect of the bladder.
4. Small left pleural effusion with left base atelectasis.
5. Solitary gallstone.

## 2014-09-18 DIAGNOSIS — N200 Calculus of kidney: Secondary | ICD-10-CM | POA: Diagnosis not present

## 2014-09-18 IMAGING — CR DG ABD PORTABLE 2V
1 series · 3 of 3 positions shown · non-contrast
Comparison: Nephrostomy placement of 1 day prior

CLINICAL DATA: Left lateral abdominal pain. A nephrostomy tube
placed yesterday. Abdominal distention and bloating.

EXAM:
PORTABLE ABDOMEN - 2 VIEW

[Series 1: ap (kub) · U · 3 of 3 slices shown]
[im 1/3]
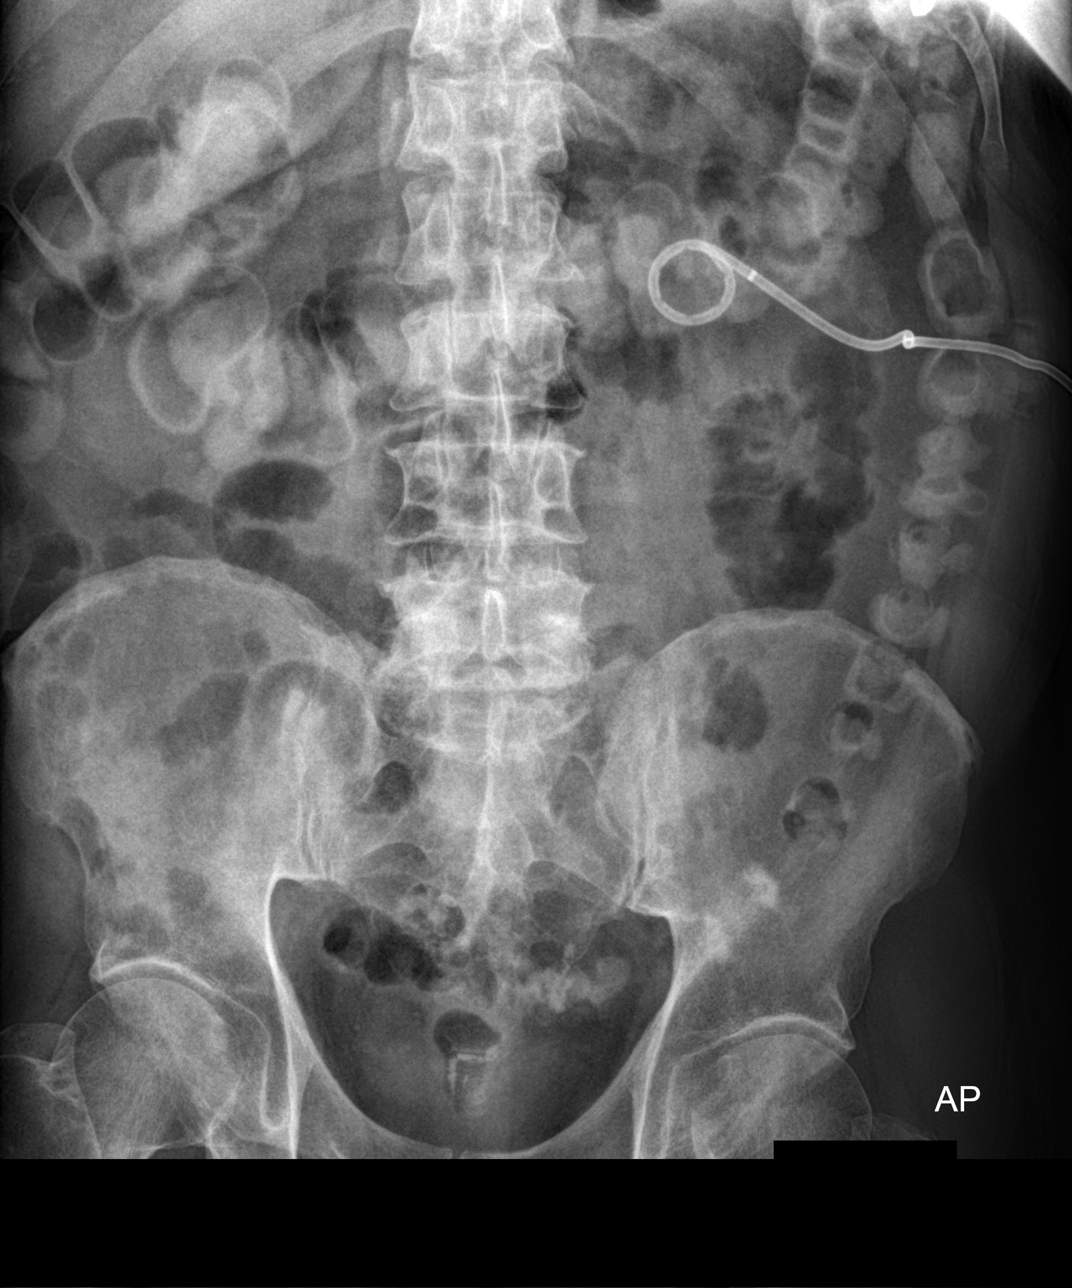
[im 2/3]
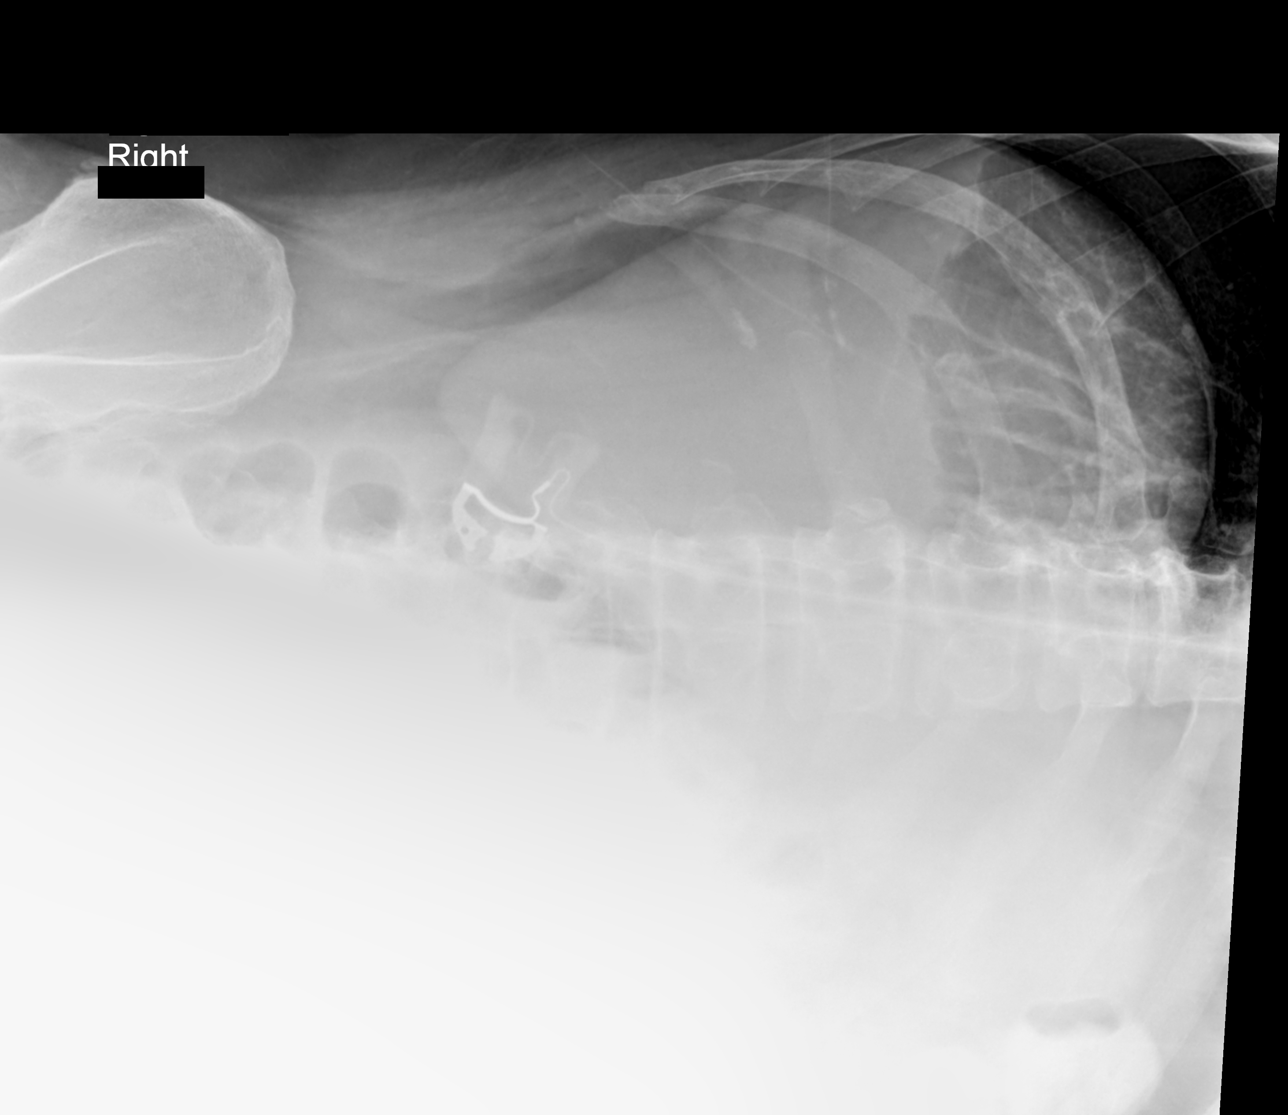
[im 3/3]
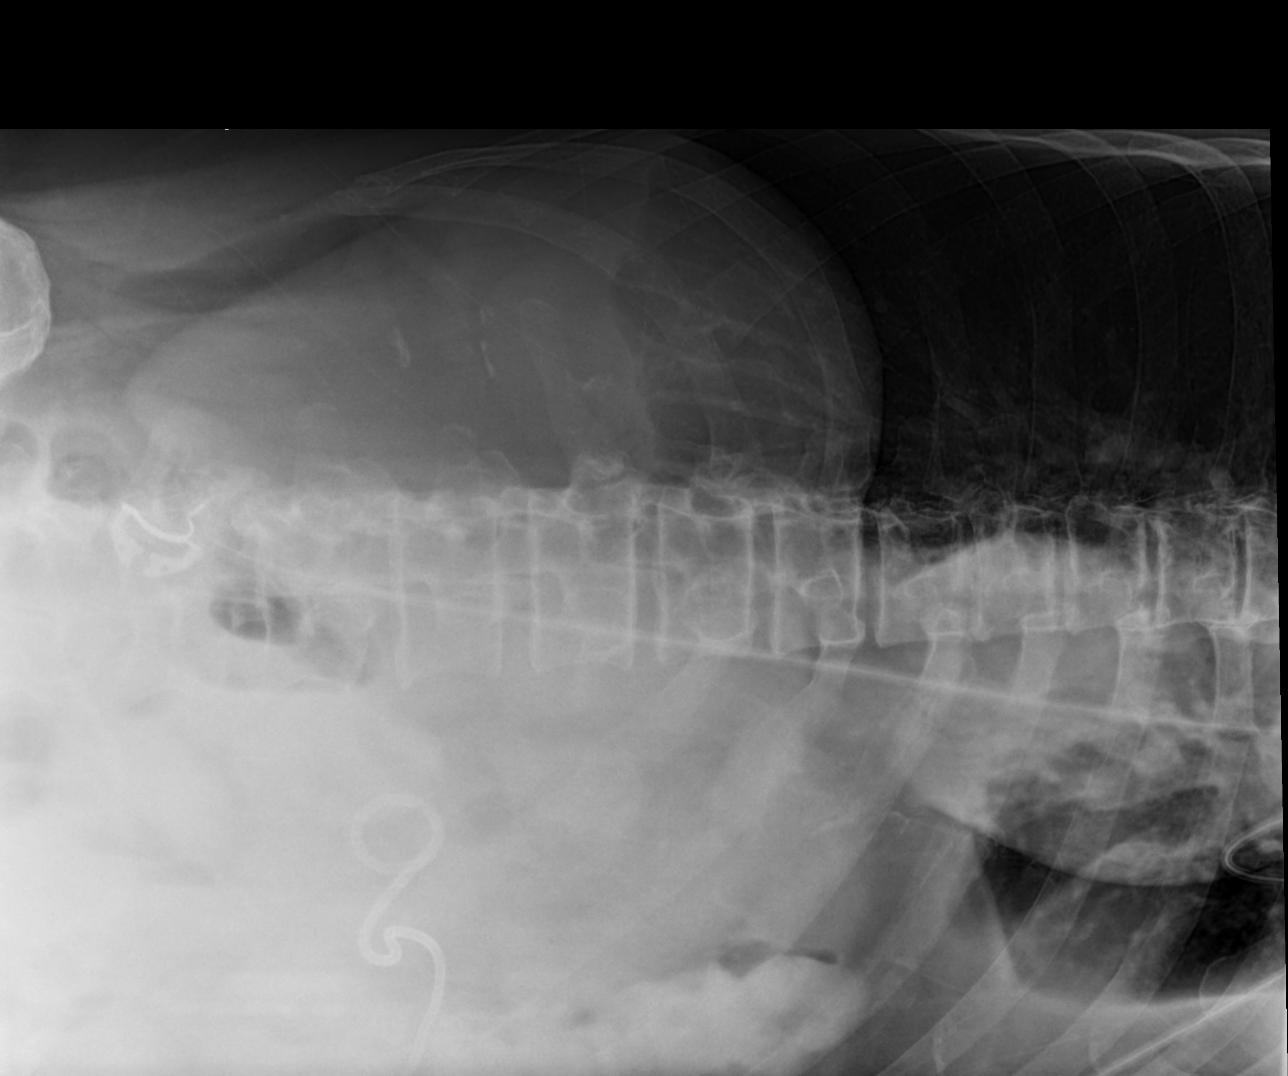

[3 of 3 positions shown; findings below may reference images not displayed]

FINDINGS: Supine and right side up decubitus views. The supine view
demonstrates contrast throughout the colon, normal in caliber. No
small bowel distension. Nephrostomy tube which projects over the
left side of the abdomen.

Right side up decubitus views demonstrate no free intraperitoneal
air or significant air-fluid levels.
IMPRESSION: No acute findings.

## 2014-10-22 ENCOUNTER — Other Ambulatory Visit: Payer: Self-pay

## 2014-10-22 DIAGNOSIS — R112 Nausea with vomiting, unspecified: Secondary | ICD-10-CM

## 2014-10-22 DIAGNOSIS — K8020 Calculus of gallbladder without cholecystitis without obstruction: Secondary | ICD-10-CM | POA: Diagnosis not present

## 2014-10-22 DIAGNOSIS — Z8719 Personal history of other diseases of the digestive system: Secondary | ICD-10-CM

## 2014-10-24 ENCOUNTER — Ambulatory Visit
Admission: RE | Admit: 2014-10-24 | Discharge: 2014-10-24 | Disposition: A | Discharge status: Home / Self Care | Payer: Medicare Other | Source: Ambulatory Visit | Attending: General Surgery | Admitting: General Surgery

## 2014-10-24 DIAGNOSIS — N132 Hydronephrosis with renal and ureteral calculous obstruction: Secondary | ICD-10-CM | POA: Diagnosis not present

## 2014-10-24 DIAGNOSIS — N289 Disorder of kidney and ureter, unspecified: Secondary | ICD-10-CM | POA: Diagnosis not present

## 2014-10-24 DIAGNOSIS — K8020 Calculus of gallbladder without cholecystitis without obstruction: Secondary | ICD-10-CM | POA: Diagnosis not present

## 2014-10-25 DIAGNOSIS — N200 Calculus of kidney: Secondary | ICD-10-CM | POA: Diagnosis not present

## 2014-11-07 ENCOUNTER — Ambulatory Visit: Payer: Self-pay | Admitting: General Surgery

## 2014-11-07 DIAGNOSIS — K8020 Calculus of gallbladder without cholecystitis without obstruction: Secondary | ICD-10-CM | POA: Diagnosis not present

## 2014-11-07 NOTE — H&P (Deleted)
10/29/2014 9:58 AM Location: [facility] Patient #: [id number] DOB: 03/27/1952 Married / Language: English / Race: White Male  History of Present Illness (Douglas A. Ninfa Linden MD; 10/29/2014 10:14 AM) The patient is a 69 year old male who presents with an inguinal hernia. This is a pleasant gentleman referred by Dr. Leslie Andrea for evaluation of a left inguinal hernia. The patient reports he may have had a hernia some time but just noticed it about 6 weeks ago. He is actually wearing a truss because it sticks out most time now. He has almost no discomfort and no obstructive symptoms. He had a previous right inguinal hernia repair back when he was in college. He is otherwise healthy without complaints   Other Problems Elbert Ewings, CMA; 10/29/2014 9:58 AM) Hypercholesterolemia Inguinal Hernia  Past Surgical History Elbert Ewings, CMA; 10/29/2014 9:58 AM) Colon Polyp Removal - Colonoscopy Open Inguinal Hernia Surgery Right.  Diagnostic Studies History Elbert Ewings, CMA; 10/29/2014 9:58 AM) Colonoscopy 1-5 years ago  Allergies Elbert Ewings, CMA; 10/29/2014 9:59 AM) Penicillin V *PENICILLINS* Ibuprofen *ANALGESICS - ANTI-INFLAMMATORY* Anaphylaxis, Itching, Rash.  Medication History Elbert Ewings, [place]; 10/29/2014 9:59 AM) No Current Medications Medications Reconciled  Social History Elbert Ewings, [place]; 10/29/2014 9:58 AM) Alcohol use Occasional alcohol use. Caffeine use Carbonated beverages. No drug use Tobacco use Never smoker.  Family History Elbert Ewings, [place]; 10/29/2014 9:58 AM) Arthritis Mother. Colon Cancer Father. Diabetes Mellitus Mother. Heart Disease Father, Mother. Hypertension Father, Mother, Son.  Review of Systems Elbert Ewings CMA; 10/29/2014 9:58 AM) General Not Present- Appetite Loss, Chills, Fatigue, Fever, Night Sweats, Weight Gain and Weight Loss. Skin Not Present- Change in Wart/Mole, Dryness, Hives, Jaundice, New Lesions,  Non-Healing Wounds, Rash and Ulcer. HEENT Not Present- Earache, Hearing Loss, Hoarseness, Nose Bleed, Oral Ulcers, Ringing in the Ears, Seasonal Allergies, Sinus Pain, Sore Throat, Visual Disturbances, Wears glasses/contact lenses and Yellow Eyes. Respiratory Not Present- Bloody sputum, Chronic Cough, Difficulty Breathing, Snoring and Wheezing. Breast Not Present- Breast Mass, Breast Pain, Nipple Discharge and Skin Changes. Cardiovascular Not Present- Chest Pain, Difficulty Breathing Lying Down, Leg Cramps, Palpitations, Rapid Heart Rate, Shortness of Breath and Swelling of Extremities. Gastrointestinal Not Present- Abdominal Pain, Bloating, Bloody Stool, Change in Bowel Habits, Chronic diarrhea, Constipation, Difficulty Swallowing, Excessive gas, Gets full quickly at meals, Hemorrhoids, Indigestion, Nausea, Rectal Pain and Vomiting. Male Genitourinary Not Present- Blood in Urine, Change in Urinary Stream, Frequency, Impotence, Nocturia, Painful Urination, Urgency and Urine Leakage. Musculoskeletal Not Present- Back Pain, Joint Pain, Joint Stiffness, Muscle Pain, Muscle Weakness and Swelling of Extremities. Neurological Not Present- Decreased Memory, Fainting, Headaches, Numbness, Seizures, Tingling, Tremor, Trouble walking and Weakness. Psychiatric Not Present- Anxiety, Bipolar, Change in Sleep Pattern, Depression, Fearful and Frequent crying. Endocrine Not Present- Cold Intolerance, Excessive Hunger, Hair Changes, Heat Intolerance, Hot flashes and New Diabetes. Hematology Not Present- Easy Bruising, Excessive bleeding, Gland problems, HIV and Persistent Infections.   Vitals Elbert Ewings CMA; 10/29/2014 9:59 AM) 10/29/2014 9:59 AM Weight: 219 lb Height: 70in Body Surface Area: 2.21 m Body Mass Index: 31.42 kg/m Temp.: 98.44F(Temporal)  Pulse: 81 (Regular)  BP: 130/70 (Sitting, Left Arm, Standard)    Physical Exam (Douglas A. Ninfa Linden MD; 10/29/2014 10:14 AM) General Mental  Status-Alert. General Appearance-Consistent with stated age. Hydration-Well hydrated. Voice-Normal.  Head and Neck Head-normocephalic, atraumatic with no lesions or palpable masses. Trachea-midline.  Eye Eyeball - Bilateral-Extraocular movements intact. Sclera/Conjunctiva - Bilateral-No scleral icterus.  Chest and Lung Exam Chest and lung exam reveals -quiet, even and easy respiratory  effort with no use of accessory muscles and on auscultation, normal breath sounds, no adventitious sounds and normal vocal resonance. Inspection Chest Wall - Normal. Back - normal.  Cardiovascular Cardiovascular examination reveals -normal heart sounds, regular rate and rhythm with no murmurs and normal pedal pulses bilaterally.  Abdomen Inspection Skin - Scar - no surgical scars. Hernias - Inguinal hernia - Left - Reducible. Palpation/Percussion Palpation and Percussion of the abdomen reveal - Soft, Non Tender, No Rebound tenderness, No Rigidity (guarding) and No hepatosplenomegaly. Auscultation Auscultation of the abdomen reveals - Bowel sounds normal.  Neurologic Neurologic evaluation reveals -alert and oriented x 3 with no impairment of recent or remote memory. Mental Status-Normal.  Musculoskeletal Normal Exam - Left-Upper Extremity Strength Normal and Lower Extremity Strength Normal. Normal Exam - Right-Upper Extremity Strength Normal, Lower Extremity Weakness.    Assessment & Plan (Douglas A. Ninfa Linden MD; 10/29/2014 10:15 AM) LEFT INGUINAL HERNIA (K40.90) Impression: I discussed the diagnosis with him. I discussed in the lower hernia repair with mesh. I discussed both the laparoscopic and open techniques. After discussion, he wished to proceed with a laparoscopic left inguinal hernia repair with mesh. I discussed the risk which includes but is not limited to bleeding, infection, injury to surrounding structures, chronic pain, recurrent hernia, postoperative  recovery, etc. He understands and wished to proceed with surgery.

## 2014-11-12 DIAGNOSIS — N200 Calculus of kidney: Secondary | ICD-10-CM | POA: Diagnosis not present

## 2014-11-12 DIAGNOSIS — R1032 Left lower quadrant pain: Secondary | ICD-10-CM | POA: Diagnosis not present

## 2014-11-12 DIAGNOSIS — N201 Calculus of ureter: Secondary | ICD-10-CM | POA: Diagnosis not present

## 2014-12-04 DIAGNOSIS — R350 Frequency of micturition: Secondary | ICD-10-CM | POA: Diagnosis not present

## 2014-12-04 DIAGNOSIS — N401 Enlarged prostate with lower urinary tract symptoms: Secondary | ICD-10-CM | POA: Diagnosis not present

## 2014-12-04 DIAGNOSIS — N200 Calculus of kidney: Secondary | ICD-10-CM | POA: Diagnosis not present

## 2014-12-12 ENCOUNTER — Ambulatory Visit (HOSPITAL_COMMUNITY)
Admission: RE | Admit: 2014-12-12 | Discharge: 2014-12-12 | Disposition: A | Discharge status: Home / Self Care | Payer: Medicare Other | Source: Ambulatory Visit | Attending: General Surgery | Admitting: General Surgery

## 2014-12-12 ENCOUNTER — Encounter (HOSPITAL_COMMUNITY): Payer: Self-pay

## 2014-12-12 ENCOUNTER — Encounter (HOSPITAL_COMMUNITY)
Admission: RE | Admit: 2014-12-12 | Discharge: 2014-12-12 | Disposition: A | Discharge status: Home / Self Care | Payer: Medicare Other | Source: Ambulatory Visit | Attending: General Surgery | Admitting: General Surgery

## 2014-12-12 DIAGNOSIS — J984 Other disorders of lung: Secondary | ICD-10-CM | POA: Diagnosis not present

## 2014-12-12 DIAGNOSIS — R05 Cough: Secondary | ICD-10-CM | POA: Diagnosis not present

## 2014-12-12 DIAGNOSIS — Z01818 Encounter for other preprocedural examination: Secondary | ICD-10-CM

## 2014-12-12 DIAGNOSIS — Z01812 Encounter for preprocedural laboratory examination: Secondary | ICD-10-CM | POA: Diagnosis not present

## 2014-12-12 DIAGNOSIS — K219 Gastro-esophageal reflux disease without esophagitis: Secondary | ICD-10-CM | POA: Insufficient documentation

## 2014-12-12 DIAGNOSIS — R918 Other nonspecific abnormal finding of lung field: Secondary | ICD-10-CM | POA: Diagnosis not present

## 2014-12-12 DIAGNOSIS — F329 Major depressive disorder, single episode, unspecified: Secondary | ICD-10-CM | POA: Diagnosis not present

## 2014-12-12 DIAGNOSIS — Z8701 Personal history of pneumonia (recurrent): Secondary | ICD-10-CM | POA: Insufficient documentation

## 2014-12-12 DIAGNOSIS — G20 Parkinson's disease: Secondary | ICD-10-CM | POA: Insufficient documentation

## 2014-12-12 DIAGNOSIS — I951 Orthostatic hypotension: Secondary | ICD-10-CM | POA: Insufficient documentation

## 2014-12-12 HISTORY — DX: Major depressive disorder, single episode, unspecified: F32.9

## 2014-12-12 HISTORY — DX: Depression, unspecified: F32.A

## 2014-12-12 HISTORY — DX: Constipation, unspecified: K59.00

## 2014-12-12 HISTORY — DX: Other fatigue: R53.83

## 2014-12-12 HISTORY — DX: Orthostatic hypotension: I95.1

## 2014-12-12 LAB — CBC
HEMATOCRIT: 42.3 % (ref 39.0–52.0)
HEMOGLOBIN: 14.1 g/dL (ref 13.0–17.0)
MCH: 30.9 pg (ref 26.0–34.0)
MCHC: 33.3 g/dL (ref 30.0–36.0)
MCV: 92.6 fL (ref 78.0–100.0)
Platelets: 337 10*3/uL (ref 150–400)
RBC: 4.57 MIL/uL (ref 4.22–5.81)
RDW: 12.2 % (ref 11.5–15.5)
WBC: 6.9 10*3/uL (ref 4.0–10.5)

## 2014-12-12 LAB — BASIC METABOLIC PANEL
Anion gap: 7 (ref 5–15)
BUN: 11 mg/dL (ref 6–20)
CHLORIDE: 104 mmol/L (ref 101–111)
CO2: 26 mmol/L (ref 22–32)
Calcium: 9.1 mg/dL (ref 8.9–10.3)
Creatinine, Ser: 0.96 mg/dL (ref 0.61–1.24)
GFR calc non Af Amer: >60 mL/min (ref >60)
Glucose, Bld: 108 mg/dL — ABNORMAL HIGH (ref 65–99)
Potassium: 3.8 mmol/L (ref 3.5–5.1)
Sodium: 137 mmol/L (ref 135–145)

## 2014-12-12 NOTE — Pre-Procedure Instructions (Signed)
[name] [name]  12/12/2014     Your procedure is scheduled on : Friday December 21, 2014 at 7:30 AM.  Report to [name] Admitting at 5:30 AM.  Call this number if you have problems the morning of surgery: [phone]    Remember:  Do not eat food or drink liquids after midnight.  Take these medicines the morning of surgery with A SIP OF WATER : Acetaminophen (Tylenol) if needed, Alprazolam (Xanax) if needed, Carbidopa (Sinemet), Escitalopram (Lexapro), Ranitidine (Zantac) if needed   Stop taking any vitamins, herbal medications, Ibuprofen, Advil, Motrin, Aleve, Goodys, BC Powder, etc on Friday November 11th   Do not wear jewelry.  Do not wear lotions, powders, or cologne.    Men may shave face and neck.  Do not bring valuables to the hospital.  [facility] is not responsible for any belongings or valuables.  Contacts, dentures or bridgework may not be worn into surgery.  Leave your suitcase in the car.  After surgery it may be brought to your room.  For patients admitted to the hospital, discharge time will be determined by your treatment team.  Patients discharged the day of surgery will not be allowed to drive home.   Name and phone number of your driver:    Special instructions:  Shower using CHG soap the night before and the morning of your surgery  Please read over the following fact sheets that you were given. Pain Booklet, Coughing and Deep Breathing and Surgical Site Infection Prevention

## 2014-12-12 NOTE — Progress Notes (Signed)
PCP is Blane Ohara  Patient denied having any cardiac issues, but stated he has had pneumonia twice recently, and that he had a fever about ten days ago. Patient stated he went to the [facility] in [place] about a week ago and was prescribed a ten day course of antibiotics. Patient informed Nurse that he has a cough and is coughing up yellow sputum. Temperature upon arrival to PAT was 97.4.  Patients wife was at chairside during PAT appointment and informed Nurse that patient becomes very dizzy if he stands up and begins to walks too quickly. She informed Nurse that patient has had some falls.   Patient asked Nurse if he could wear patches for Parkinson's DOS and Nurse asked Ebony Hail, [place] while she was on the telephone. Ebony Hail, [place] stated that she thought the patches would be ok for patient to wear DOS, and if anything changes then patient would be notified. Nurse informed patient and his wife of this. Both verbalized understanding.

## 2014-12-13 NOTE — Progress Notes (Signed)
Anesthesia Chart Review: Patient is a 69 year old male scheduled for laparoscopic cholecystectomy on 12/21/14 by Dr. Rosendo Gros.  History includes Parkinson's disease s/p deep brain stimulator, GERD, orthostatic hypotension, depression, nephrolithiasis, never smoker, Peyronie disease. Reported treatment for PNA within the last few months at [facility] that was treated with antibiotics. PCP is listed as Blane Ohara, PA-C.  Meds include Uroxatral, Xanax, Sinemet, Klonopin, Lexapro, Exelon patch, Zantac, Neupro. Discussed Exelon patch with anesthesiologist Dr. Tobias Alexander. He felt okay for patient to continue PD meds as usual.   12/12/14 CXR: IMPRESSION: Mild hyperinflation consistent with COPD or reactive airway disease. Minimal left lower lobe scarring. There is no evidence of pneumonia nor CHF.  Preoperative labs noted.   If no acute changes then I would anticipate that he could proceed as planned.  [facility]/Anesthesiology Phone [phone] 12/13/2014 11:05 AM

## 2014-12-14 ENCOUNTER — Ambulatory Visit: Payer: Self-pay | Admitting: General Surgery

## 2014-12-14 ENCOUNTER — Other Ambulatory Visit: Payer: Self-pay | Admitting: General Surgery

## 2014-12-14 NOTE — H&P (Signed)
History of Present Illness Ralene Ok MD; 11/07/2014 10:28 AM) Patient words: gallbladder.  The patient is a 69 year old male who presents for evaluation of gall stones. The patient is a 69 year old male who was previously seen secondary to likely symptomatic cholelithiasis. Patient underwent ultrasound which revealed an 8 mm stone at the neck of the gallbladder. There is no signs of acute cholecystitis. Patient states that he has since changed his diet and feels that he's had less abdominal pain/right upper quadrant pain with decrease in the fat in his diet.   Allergies (Sonya Bynum, CMA; 11/07/2014 10:06 AM) Flomax *GENITOURINARY AGENTS - MISCELLANEOUS*  Medication History (Sonya Bynum, CMA; 11/07/2014 10:06 AM) Alfuzosin HCl ER (10MG  Tablet ER 24HR, Oral) Active. Carbidopa (25MG  Tablet, Oral five times a day) Active. Rotigotine (1MG /24HR Patch 24HR, Transdermal) Active. Escitalopram Oxalate (20MG  Tablet, Oral) Active. ClonazePAM (2MG  Tablet, Oral) Active. Medications Reconciled    Review of Systems Ralene Ok MD; 11/07/2014 10:27 AM) General Present- Feeling well. Not Present- Fever. Respiratory Not Present- Cough and Difficulty Breathing. Cardiovascular Not Present- Chest Pain. Gastrointestinal Present- Abdominal Pain and Nausea. Musculoskeletal Not Present- Myalgia. Neurological Not Present- Weakness.  Vitals (Sonya Bynum CMA; 11/07/2014 10:06 AM) 11/07/2014 10:05 AM Weight: 200 lb Height: 70in Body Surface Area: 2.12 m Body Mass Index: 28.7 kg/m  Temp.: 60F(Temporal)  Pulse: 77 (Regular)  BP: 130/72 (Sitting, Left Arm, Standard)     Physical Exam Ralene Ok MD; 11/07/2014 10:28 AM) General Mental Status-Alert. General Appearance-Consistent with stated age. Hydration-Well hydrated. Voice-Normal.  Head and Neck Head-normocephalic, atraumatic with no lesions or palpable masses.  Eye Eyeball - Bilateral-Extraocular  movements intact. Sclera/Conjunctiva - Bilateral-No scleral icterus.  Chest and Lung Exam Chest and lung exam reveals -quiet, even and easy respiratory effort with no use of accessory muscles. Inspection Chest Wall - Normal. Back - normal.  Cardiovascular Cardiovascular examination reveals -normal heart sounds, regular rate and rhythm with no murmurs.  Abdomen Inspection Inspection of the abdomen reveals - Note: Umbilical hernia. Palpation/Percussion Normal exam - Soft, Non Tender, No Rebound tenderness, No Rigidity (guarding) and No hepatosplenomegaly. Auscultation Normal exam - Bowel sounds normal.  Neurologic Neurologic evaluation reveals -alert and oriented x 3 with no impairment of recent or remote memory. Mental Status-Normal.  Musculoskeletal Normal Exam - Left-Upper Extremity Strength Normal and Lower Extremity Strength Normal. Normal Exam - Right-Upper Extremity Strength Normal, Lower Extremity Weakness.    Assessment & Plan Ralene Ok MD; 11/07/2014 10:28 AM) CALCULUS OF GALLBLADDER WITHOUT CHOLECYSTITIS WITHOUT OBSTRUCTION (K80.20) Impression: 69 year old male with a calculus of the gallbladder. 1. Will proceed to operative for laparoscopic cholecystectomy. 2. Risks and benefits were discussed with the patient to generally include, but not limited to: infection, bleeding, possible need for post op ERCP, damage to the bile ducts, bile leak, and possible need for further surgery. Alternatives were offered and described. All questions were answered and the patient voiced understanding of the procedure and wishes to proceed at this point with a laparoscopic cholecystectomy

## 2014-12-20 MED ORDER — CEFAZOLIN SODIUM-DEXTROSE 2-3 GM-% IV SOLR
2.0000 g | INTRAVENOUS | Status: AC
  Administered 2014-12-21: 2 g via INTRAVENOUS
  Filled 2014-12-20: qty 50

## 2014-12-20 MED ORDER — CHLORHEXIDINE GLUCONATE 4 % EX LIQD: 1.0000 "application " | Freq: Once | CUTANEOUS | Status: DC

## 2014-12-21 ENCOUNTER — Encounter (HOSPITAL_COMMUNITY)
Admission: RE | Disposition: A | Discharge status: Home / Self Care | Payer: Self-pay | Source: Ambulatory Visit | Attending: General Surgery

## 2014-12-21 ENCOUNTER — Ambulatory Visit (HOSPITAL_COMMUNITY): Payer: Medicare Other | Admitting: Anesthesiology

## 2014-12-21 ENCOUNTER — Ambulatory Visit (HOSPITAL_COMMUNITY)
Admission: RE | Admit: 2014-12-21 | Discharge: 2014-12-21 | Disposition: A | Discharge status: Home / Self Care | Payer: Medicare Other | Source: Ambulatory Visit | Attending: General Surgery | Admitting: General Surgery

## 2014-12-21 ENCOUNTER — Ambulatory Visit (HOSPITAL_COMMUNITY): Payer: Medicare Other | Admitting: Vascular Surgery

## 2014-12-21 ENCOUNTER — Encounter (HOSPITAL_COMMUNITY): Payer: Self-pay | Admitting: Anesthesiology

## 2014-12-21 DIAGNOSIS — K8010 Calculus of gallbladder with chronic cholecystitis without obstruction: Secondary | ICD-10-CM | POA: Insufficient documentation

## 2014-12-21 DIAGNOSIS — K8020 Calculus of gallbladder without cholecystitis without obstruction: Secondary | ICD-10-CM | POA: Diagnosis not present

## 2014-12-21 DIAGNOSIS — G20 Parkinson's disease: Secondary | ICD-10-CM | POA: Diagnosis not present

## 2014-12-21 DIAGNOSIS — A419 Sepsis, unspecified organism: Secondary | ICD-10-CM | POA: Diagnosis not present

## 2014-12-21 HISTORY — PX: CHOLECYSTECTOMY: SHX55

## 2014-12-21 SURGERY — LAPAROSCOPIC CHOLECYSTECTOMY
Anesthesia: General | Site: Abdomen

## 2014-12-21 MED ORDER — LACTATED RINGERS IV SOLN
INTRAVENOUS | Status: DC | PRN
  Administered 2014-12-21 (×2): via INTRAVENOUS

## 2014-12-21 MED ORDER — SUGAMMADEX SODIUM 200 MG/2ML IV SOLN
INTRAVENOUS | Status: AC
  Filled 2014-12-21: qty 2

## 2014-12-21 MED ORDER — OXYCODONE HCL 5 MG PO TABS
ORAL_TABLET | ORAL | Status: AC
  Filled 2014-12-21: qty 1

## 2014-12-21 MED ORDER — ROCURONIUM BROMIDE 100 MG/10ML IV SOLN
INTRAVENOUS | Status: DC | PRN
  Administered 2014-12-21: 40 mg via INTRAVENOUS

## 2014-12-21 MED ORDER — OXYCODONE HCL 5 MG/5ML PO SOLN: 5.0000 mg | Freq: Once | ORAL | Status: AC | PRN

## 2014-12-21 MED ORDER — PROPOFOL 10 MG/ML IV BOLUS
INTRAVENOUS | Status: AC
  Filled 2014-12-21: qty 40

## 2014-12-21 MED ORDER — OXYCODONE HCL 5 MG PO TABS
5.0000 mg | ORAL_TABLET | Freq: Once | ORAL | Status: AC | PRN
  Administered 2014-12-21: 5 mg via ORAL

## 2014-12-21 MED ORDER — SODIUM CHLORIDE 0.9 % IR SOLN
Status: DC | PRN
  Administered 2014-12-21: 1000 mL

## 2014-12-21 MED ORDER — ACETAMINOPHEN 650 MG RE SUPP: 650.0000 mg | RECTAL | Status: DC | PRN

## 2014-12-21 MED ORDER — PHENYLEPHRINE HCL 10 MG/ML IJ SOLN
INTRAMUSCULAR | Status: DC | PRN
  Administered 2014-12-21 (×2): 80 ug via INTRAVENOUS

## 2014-12-21 MED ORDER — OXYCODONE HCL 5 MG PO TABS: 5.0000 mg | ORAL_TABLET | ORAL | Status: DC | PRN

## 2014-12-21 MED ORDER — SODIUM CHLORIDE 0.9 % IJ SOLN: 3.0000 mL | Freq: Two times a day (BID) | INTRAMUSCULAR | Status: DC

## 2014-12-21 MED ORDER — OXYCODONE-ACETAMINOPHEN 5-325 MG PO TABS: 1.0000 | ORAL_TABLET | ORAL | Status: DC | PRN

## 2014-12-21 MED ORDER — FENTANYL CITRATE (PF) 100 MCG/2ML IJ SOLN
INTRAMUSCULAR | Status: DC | PRN
  Administered 2014-12-21: 100 ug via INTRAVENOUS
  Administered 2014-12-21: 50 ug via INTRAVENOUS

## 2014-12-21 MED ORDER — ONDANSETRON HCL 4 MG/2ML IJ SOLN: 4.0000 mg | Freq: Once | INTRAMUSCULAR | Status: DC | PRN

## 2014-12-21 MED ORDER — SUGAMMADEX SODIUM 200 MG/2ML IV SOLN
INTRAVENOUS | Status: DC | PRN
  Administered 2014-12-21: 200 mg via INTRAVENOUS

## 2014-12-21 MED ORDER — LIDOCAINE HCL (CARDIAC) 20 MG/ML IV SOLN
INTRAVENOUS | Status: DC | PRN
  Administered 2014-12-21: 50 mg via INTRAVENOUS

## 2014-12-21 MED ORDER — BUPIVACAINE HCL 0.25 % IJ SOLN
INTRAMUSCULAR | Status: DC | PRN
  Administered 2014-12-21: 4 mL

## 2014-12-21 MED ORDER — FENTANYL CITRATE (PF) 250 MCG/5ML IJ SOLN
INTRAMUSCULAR | Status: AC
  Filled 2014-12-21: qty 5

## 2014-12-21 MED ORDER — SODIUM CHLORIDE 0.9 % IV SOLN: 250.0000 mL | INTRAVENOUS | Status: DC | PRN

## 2014-12-21 MED ORDER — ONDANSETRON HCL 4 MG/2ML IJ SOLN
INTRAMUSCULAR | Status: DC | PRN
  Administered 2014-12-21: 4 mg via INTRAVENOUS

## 2014-12-21 MED ORDER — EPHEDRINE SULFATE 50 MG/ML IJ SOLN
INTRAMUSCULAR | Status: DC | PRN
  Administered 2014-12-21 (×3): 5 mg via INTRAVENOUS

## 2014-12-21 MED ORDER — MIDAZOLAM HCL 2 MG/2ML IJ SOLN
INTRAMUSCULAR | Status: AC
  Filled 2014-12-21: qty 2

## 2014-12-21 MED ORDER — MORPHINE SULFATE (PF) 2 MG/ML IV SOLN: 2.0000 mg | INTRAVENOUS | Status: DC | PRN

## 2014-12-21 MED ORDER — ACETAMINOPHEN 325 MG PO TABS: 650.0000 mg | ORAL_TABLET | ORAL | Status: DC | PRN

## 2014-12-21 MED ORDER — MIDAZOLAM HCL 5 MG/5ML IJ SOLN
INTRAMUSCULAR | Status: DC | PRN
  Administered 2014-12-21: 2 mg via INTRAVENOUS

## 2014-12-21 MED ORDER — BUPIVACAINE HCL (PF) 0.25 % IJ SOLN
INTRAMUSCULAR | Status: AC
  Filled 2014-12-21: qty 30

## 2014-12-21 MED ORDER — 0.9 % SODIUM CHLORIDE (POUR BTL) OPTIME
TOPICAL | Status: DC | PRN
  Administered 2014-12-21: 1000 mL

## 2014-12-21 MED ORDER — FENTANYL CITRATE (PF) 100 MCG/2ML IJ SOLN
25.0000 ug | INTRAMUSCULAR | Status: DC | PRN
Start: 2014-12-21 — End: 2014-12-21

## 2014-12-21 MED ORDER — SODIUM CHLORIDE 0.9 % IJ SOLN: 3.0000 mL | INTRAMUSCULAR | Status: DC | PRN

## 2014-12-21 MED ORDER — PROPOFOL 10 MG/ML IV BOLUS
INTRAVENOUS | Status: DC | PRN
  Administered 2014-12-21: 130 mg via INTRAVENOUS

## 2014-12-21 SURGICAL SUPPLY — 45 items
APL SKNCLS STERI-STRIP NONHPOA (GAUZE/BANDAGES/DRESSINGS) ×1
BAG SPEC RTRVL 10 TROC 200 (ENDOMECHANICALS)
BANDAGE COBAN STERILE 2 (GAUZE/BANDAGES/DRESSINGS) ×2 IMPLANT
BENZOIN TINCTURE PRP APPL 2/3 (GAUZE/BANDAGES/DRESSINGS) ×2 IMPLANT
CANISTER SUCTION 2500CC (MISCELLANEOUS) ×2 IMPLANT
CHLORAPREP W/TINT 26ML (MISCELLANEOUS) ×2 IMPLANT
CLIP LIGATING HEMO O LOK GREEN (MISCELLANEOUS) ×4 IMPLANT
CLSR STERI-STRIP ANTIMIC 1/2X4 (GAUZE/BANDAGES/DRESSINGS) ×2 IMPLANT
COVER SURGICAL LIGHT HANDLE (MISCELLANEOUS) ×2 IMPLANT
COVER TRANSDUCER ULTRASND (DRAPES) ×2 IMPLANT
DEVICE TROCAR PUNCTURE CLOSURE (ENDOMECHANICALS) ×2 IMPLANT
ELECT REM PT RETURN 9FT ADLT (ELECTROSURGICAL) ×2
ELECTRODE REM PT RTRN 9FT ADLT (ELECTROSURGICAL) ×1 IMPLANT
GAUZE SPONGE 2X2 8PLY STRL LF (GAUZE/BANDAGES/DRESSINGS) ×1 IMPLANT
GLOVE BIO SURGEON STRL SZ7 (GLOVE) ×2 IMPLANT
GLOVE BIO SURGEON STRL SZ7.5 (GLOVE) ×4 IMPLANT
GLOVE BIOGEL PI IND STRL 7.0 (GLOVE) ×2 IMPLANT
GLOVE BIOGEL PI INDICATOR 7.0 (GLOVE) ×2
GLOVE SURG SS PI 7.0 STRL IVOR (GLOVE) ×2 IMPLANT
GOWN STRL REUS W/ TWL LRG LVL3 (GOWN DISPOSABLE) ×2 IMPLANT
GOWN STRL REUS W/ TWL XL LVL3 (GOWN DISPOSABLE) ×1 IMPLANT
GOWN STRL REUS W/TWL LRG LVL3 (GOWN DISPOSABLE) ×4
GOWN STRL REUS W/TWL XL LVL3 (GOWN DISPOSABLE) ×2
KIT BASIN OR (CUSTOM PROCEDURE TRAY) ×2 IMPLANT
KIT ROOM TURNOVER OR (KITS) ×2 IMPLANT
NEEDLE INSUFFLATION 14GA 120MM (NEEDLE) ×2 IMPLANT
NS IRRIG 1000ML POUR BTL (IV SOLUTION) ×2 IMPLANT
PAD ARMBOARD 7.5X6 YLW CONV (MISCELLANEOUS) ×4 IMPLANT
POUCH RETRIEVAL ECOSAC 10 (ENDOMECHANICALS) IMPLANT
POUCH RETRIEVAL ECOSAC 10MM (ENDOMECHANICALS)
SCISSORS LAP 5X35 DISP (ENDOMECHANICALS) ×2 IMPLANT
SET IRRIG TUBING LAPAROSCOPIC (IRRIGATION / IRRIGATOR) ×2 IMPLANT
SHEARS HARMONIC ACE PLUS 36CM (ENDOMECHANICALS) ×2 IMPLANT
SLEEVE ENDOPATH XCEL 5M (ENDOMECHANICALS) ×2 IMPLANT
SPECIMEN JAR SMALL (MISCELLANEOUS) ×2 IMPLANT
SPONGE GAUZE 2X2 STER 10/PKG (GAUZE/BANDAGES/DRESSINGS) ×1
STRIP CLOSURE SKIN 1/4X4 (GAUZE/BANDAGES/DRESSINGS) ×2 IMPLANT
SUT MNCRL AB 3-0 PS2 18 (SUTURE) ×2 IMPLANT
TAPE CLOTH SURG 4X10 WHT LF (GAUZE/BANDAGES/DRESSINGS) ×2 IMPLANT
TOWEL OR 17X24 6PK STRL BLUE (TOWEL DISPOSABLE) ×2 IMPLANT
TOWEL OR 17X26 10 PK STRL BLUE (TOWEL DISPOSABLE) IMPLANT
TRAY LAPAROSCOPIC MC (CUSTOM PROCEDURE TRAY) ×2 IMPLANT
TROCAR XCEL NON-BLD 11X100MML (ENDOMECHANICALS) ×2 IMPLANT
TROCAR XCEL NON-BLD 5MMX100MML (ENDOMECHANICALS) ×2 IMPLANT
TUBING INSUFFLATION (TUBING) ×2 IMPLANT

## 2014-12-21 NOTE — H&P (View-Only) (Signed)
History of Present Illness [name] Ok MD; 11/07/2014 10:28 AM) Patient words: gallbladder.  The patient is a 69 year old male who presents for evaluation of gall stones. The patient is a 69 year old male who was previously seen secondary to likely symptomatic cholelithiasis. Patient underwent ultrasound which revealed an 8 mm stone at the neck of the gallbladder. There is no signs of acute cholecystitis. Patient states that he has since changed his diet and feels that he's had less abdominal pain/right upper quadrant pain with decrease in the fat in his diet.   Allergies (Sonya Bynum, CMA; 11/07/2014 10:06 AM) Flomax *GENITOURINARY AGENTS - MISCELLANEOUS*  Medication History (Sonya Bynum, CMA; 11/07/2014 10:06 AM) Alfuzosin HCl ER (10MG  Tablet ER 24HR, Oral) Active. Carbidopa (25MG  Tablet, Oral five times a day) Active. Rotigotine (1MG /24HR Patch 24HR, Transdermal) Active. Escitalopram Oxalate (20MG  Tablet, Oral) Active. ClonazePAM (2MG  Tablet, Oral) Active. Medications Reconciled    Review of Systems [name] Ok MD; 11/07/2014 10:27 AM) General Present- Feeling well. Not Present- Fever. Respiratory Not Present- Cough and Difficulty Breathing. Cardiovascular Not Present- Chest Pain. Gastrointestinal Present- Abdominal Pain and Nausea. Musculoskeletal Not Present- Myalgia. Neurological Not Present- Weakness.  Vitals (Sonya Bynum CMA; 11/07/2014 10:06 AM) 11/07/2014 10:05 AM Weight: 200 lb Height: 70in Body Surface Area: 2.12 m Body Mass Index: 28.7 kg/m  Temp.: 37F(Temporal)  Pulse: 77 (Regular)  BP: 130/72 (Sitting, Left Arm, Standard)     Physical Exam [name] Ok MD; 11/07/2014 10:28 AM) General Mental Status-Alert. General Appearance-Consistent with stated age. Hydration-Well hydrated. Voice-Normal.  Head and Neck Head-normocephalic, atraumatic with no lesions or palpable masses.  Eye Eyeball - Bilateral-Extraocular  movements intact. Sclera/Conjunctiva - Bilateral-No scleral icterus.  Chest and Lung Exam Chest and lung exam reveals -quiet, even and easy respiratory effort with no use of accessory muscles. Inspection Chest Wall - Normal. Back - normal.  Cardiovascular Cardiovascular examination reveals -normal heart sounds, regular rate and rhythm with no murmurs.  Abdomen Inspection Inspection of the abdomen reveals - Note: Umbilical hernia. Palpation/Percussion Normal exam - Soft, Non Tender, No Rebound tenderness, No Rigidity (guarding) and No hepatosplenomegaly. Auscultation Normal exam - Bowel sounds normal.  Neurologic Neurologic evaluation reveals -alert and oriented x 3 with no impairment of recent or remote memory. Mental Status-Normal.  Musculoskeletal Normal Exam - Left-Upper Extremity Strength Normal and Lower Extremity Strength Normal. Normal Exam - Right-Upper Extremity Strength Normal, Lower Extremity Weakness.    Assessment & Plan [name] Ok MD; 11/07/2014 10:28 AM) CALCULUS OF GALLBLADDER WITHOUT CHOLECYSTITIS WITHOUT OBSTRUCTION (K80.20) Impression: 69 year old male with a calculus of the gallbladder. 1. Will proceed to operative for laparoscopic cholecystectomy. 2. Risks and benefits were discussed with the patient to generally include, but not limited to: infection, bleeding, possible need for post op ERCP, damage to the bile ducts, bile leak, and possible need for further surgery. Alternatives were offered and described. All questions were answered and the patient voiced understanding of the procedure and wishes to proceed at this point with a laparoscopic cholecystectomy

## 2014-12-21 NOTE — Anesthesia Preprocedure Evaluation (Addendum)
Anesthesia Evaluation  Patient identified by MRN, date of birth, ID band Patient awake    Reviewed: Allergy & Precautions, NPO status , Patient's Chart, lab work & pertinent test results  Airway Mallampati: II  TM Distance: >3 FB Neck ROM: Full    Dental  (+) Teeth Intact, Dental Advisory Given   Pulmonary    breath sounds clear to auscultation       Cardiovascular  Rhythm:Regular Rate:Normal     Neuro/Psych    GI/Hepatic   Endo/Other    Renal/GU      Musculoskeletal   Abdominal   Peds  Hematology   Anesthesia Other Findings Resting tremor L>R hand  Reproductive/Obstetrics                             Anesthesia Physical Anesthesia Plan  ASA: III  Anesthesia Plan: General   Post-op Pain Management:    Induction: Intravenous  Airway Management Planned: Oral ETT  Additional Equipment:   Intra-op Plan:   Post-operative Plan: Extubation in OR  Informed Consent: I have reviewed the patients History and Physical, chart, labs and discussed the procedure including the risks, benefits and alternatives for the proposed anesthesia with the patient or authorized representative who has indicated his/her understanding and acceptance.   Dental advisory given  Plan Discussed with: CRNA and Anesthesiologist  Anesthesia Plan Comments: (Symptomatic cholelithiasis Parkinsonism with deep brain stimulator H/O kidney stones  Plan GA with oral ETT  Roberts Gaudy)        Anesthesia Quick Evaluation

## 2014-12-21 NOTE — Anesthesia Postprocedure Evaluation (Signed)
  Anesthesia Post-op Note  Patient: [name] [name]  Procedure(s) Performed: Procedure(s): LAPAROSCOPIC CHOLECYSTECTOMY  Patient Location: PACU  Anesthesia Type:General  Level of Consciousness: awake, alert  and oriented  Airway and Oxygen Therapy: Patient Spontanous Breathing and Patient connected to nasal cannula oxygen  Post-op Pain: mild  Post-op Assessment: Post-op Vital signs reviewed, Patient's Cardiovascular Status Stable, Respiratory Function Stable, Patent Airway and Pain level controlled              Post-op Vital Signs: stable  Last Vitals:  Filed Vitals:   12/21/14 1026  BP:   Pulse:   Temp: 36.4 C  Resp:     Complications: No apparent anesthesia complications

## 2014-12-21 NOTE — Op Note (Signed)
12/21/2014  8:26 AM  PATIENT:  [name] [name]  69 y.o. male  PRE-OPERATIVE DIAGNOSIS:  GALLSTONES  POST-OPERATIVE DIAGNOSIS:  GALLSTONES  PROCEDURE:  Procedure(s): LAPAROSCOPIC CHOLECYSTECTOMY  SURGEON:  Surgeon(s) and Role:    * Ralene Ok, MD - Primary  ANESTHESIA:   local and general  EBL:   <5cc  BLOOD ADMINISTERED:none  DRAINS: none   LOCAL MEDICATIONS USED:  BUPIVICAINE   SPECIMEN:  Source of Specimen:  gallbladder  DISPOSITION OF SPECIMEN:  PATHOLOGY  COUNTS:  YES  TOURNIQUET:  * No tourniquets in log *  DICTATION: .Dragon Dictation  EBL: [id number]   Complications: none   Counts: reported as correct x 2   Findings:chronic cholecystitis and gallstones  Indications for procedure: Pt is a 69 y/o M with RUQ pain and seen to have gallstones.   Details of the procedure: The patient was taken to the operating and placed in the supine position with bilateral SCDs in place. A time out was called and all facts were verified. A pneumoperitoneum was obtained via A Veress needle technique to a pressure of 64mm of mercury. A 53mm trochar was then placed in the right upper quadrant under visualization, and there were no injuries to any abdominal organs. A 11 mm port was then placed in the umbilical region after infiltrating with local anesthesia under direct visualization. A second epigastric port was placed under direct visualization.   The gallbladder was identified and retracted, the peritoneum was then sharply dissected from the gallbladder and this dissection was carried down to Calot's triangle. The cystic duct was identified and stripped away circumferentially and seen going into the gallbladder 360, the critical angle was obtained.   2 clips were placed proximally one distally and the cystic duct transected. The cystic artery was identified and 2 clips placed proximally and one distally and transected. We then proceeded to remove the gallbladder off the hepatic fossa  with the Harmonic scalpel. A retrieval bag was then placed in the abdomen and gallbladder placed in the bag. The hepatic fossa was hemostatic and was excellent at this portion of the case. The subhepatic fossa and perihepatic fossa was then irrigated until the effluent was clear. The specimen bag and specimen were removed from the abdominal cavity.  The 11 mm trocar fascia was reapproximated with the Endo Close #1 Vicryl x1. The pneumoperitoneum was evacuated and all trochars removed under direct visulalization. The skin was then closed with 4-0 Monocryl and the skin dressed with Steri-Strips, gauze, and tape. The patient was awaken from general anesthesia and taken to the recovery room in stable condition.    PLAN OF CARE: Discharge to home after PACU  PATIENT DISPOSITION:  PACU - hemodynamically stable.   Delay start of Pharmacological VTE agent (>24hrs) due to surgical blood loss or risk of bleeding: not applicable

## 2014-12-21 NOTE — Anesthesia Procedure Notes (Signed)
Procedure Name: Intubation Date/Time: 12/21/2014 7:43 AM Performed by: Scheryl Darter Pre-anesthesia Checklist: Patient identified, Emergency Drugs available, Suction available, Patient being monitored and Timeout performed Patient Re-evaluated:Patient Re-evaluated prior to inductionOxygen Delivery Method: Circle system utilized Preoxygenation: Pre-oxygenation with 100% oxygen Intubation Type: IV induction Ventilation: Mask ventilation without difficulty Laryngoscope Size: Mac and 4 Grade View: Grade I Tube type: Oral Tube size: 7.5 mm Number of attempts: 1 Airway Equipment and Method: Stylet Placement Confirmation: ETT inserted through vocal cords under direct vision,  positive ETCO2 and breath sounds checked- equal and bilateral Secured at: 23 cm Tube secured with: Tape

## 2014-12-21 NOTE — Interval H&P Note (Signed)
History and Physical Interval Note:  12/21/2014 7:18 AM  [name] [name]  has presented today for surgery, with the diagnosis of GALLSTONES  The various methods of treatment have been discussed with the patient and family. After consideration of risks, benefits and other options for treatment, the patient has consented to  Procedure(s): LAPAROSCOPIC CHOLECYSTECTOMY (N/A) as a surgical intervention .  The patient's history has been reviewed, patient examined, no change in status, stable for surgery.  I have reviewed the patient's chart and labs.  Questions were answered to the patient's satisfaction.     Rosario Jacks., Anne Hahn

## 2014-12-21 NOTE — Discharge Instructions (Signed)
[facility] ______[facility] °LAPAROSCOPIC SURGERY: POST OP INSTRUCTIONS °Always review your discharge instruction sheet given to you by the facility where your surgery was performed. °IF YOU HAVE DISABILITY OR FAMILY LEAVE FORMS, YOU MUST BRING THEM TO THE OFFICE FOR PROCESSING.   °DO NOT GIVE THEM TO YOUR DOCTOR. ° °1. A prescription for pain medication may be given to you upon discharge.  Take your pain medication as prescribed, if needed.  If narcotic pain medicine is not needed, then you may take acetaminophen (Tylenol) or ibuprofen (Advil) as needed. °2. Take your usually prescribed medications unless otherwise directed. °3. If you need a refill on your pain medication, please contact your pharmacy.  They will contact our office to request authorization. Prescriptions will not be filled after 5pm or on week-ends. °4. You should follow a light diet the first few days after arrival home, such as soup and crackers, etc.  Be sure to include lots of fluids daily. °5. Most patients will experience some swelling and bruising in the area of the incisions.  Ice packs will help.  Swelling and bruising can take several days to resolve.  °6. It is common to experience some constipation if taking pain medication after surgery.  Increasing fluid intake and taking a stool softener (such as Colace) will usually help or prevent this problem from occurring.  A mild laxative (Milk of Magnesia or Miralax) should be taken according to package instructions if there are no bowel movements after 48 hours. °7. Unless discharge instructions indicate otherwise, you may remove your bandages 24-48 hours after surgery, and you may shower at that time.  You may have steri-strips (small skin tapes) in place directly over the incision.  These strips should be left on the skin for 7-10 days.  If your surgeon used skin glue on the incision, you may shower in 24 hours.  The glue will flake off over the next 2-3 weeks.  Any sutures or  staples will be removed at the office during your follow-up visit. °8. ACTIVITIES:  You may resume regular (light) daily activities beginning the next day--such as daily self-care, walking, climbing stairs--gradually increasing activities as tolerated.  You may have sexual intercourse when it is comfortable.  Refrain from any heavy lifting or straining until approved by your doctor. °a. You may drive when you are no longer taking prescription pain medication, you can comfortably wear a seatbelt, and you can safely maneuver your car and apply brakes. °b. RETURN TO WORK:  __________________________________________________________ °9. You should see your doctor in the office for a follow-up appointment approximately 2-3 weeks after your surgery.  Make sure that you call for this appointment within a day or two after you arrive home to insure a convenient appointment time. °10. OTHER INSTRUCTIONS: __________________________________________________________________________________________________________________________ __________________________________________________________________________________________________________________________ °WHEN TO CALL YOUR DOCTOR: °1. Fever over 101.0 °2. Inability to urinate °3. Continued bleeding from incision. °4. Increased pain, redness, or drainage from the incision. °5. Increasing abdominal pain ° °The clinic staff is available to answer your questions during regular business hours.  Please don’t hesitate to call and ask to speak to one of the nurses for clinical concerns.  If you have a medical emergency, go to the nearest emergency room or call 911.  A surgeon from [facility] is always on call at the hospital. °[address], [address], [place], [place]  [address] ? [address] [address], [place], [place]   [address] °[phone] ? [phone] ? FAX [phone] °Web site:   [email or web address] °

## 2014-12-21 NOTE — Transfer of Care (Signed)
Immediate Anesthesia Transfer of Care Note  Patient: [name] [name]  Procedure(s) Performed: Procedure(s): LAPAROSCOPIC CHOLECYSTECTOMY  Patient Location: PACU  Anesthesia Type:General  Level of Consciousness: awake, alert , oriented and sedated  Airway & Oxygen Therapy: Patient Spontanous Breathing and Patient connected to nasal cannula oxygen  Post-op Assessment: Report given to RN and Post -op Vital signs reviewed and stable  Post vital signs: Reviewed and stable  Last Vitals:  Filed Vitals:   12/21/14 0936  BP: 142/71  Pulse: 100  Temp:   Resp: 16    Complications: No apparent anesthesia complications

## 2014-12-24 ENCOUNTER — Encounter (HOSPITAL_COMMUNITY): Payer: Self-pay | Admitting: General Surgery

## 2015-02-06 DIAGNOSIS — G20 Parkinson's disease: Secondary | ICD-10-CM | POA: Diagnosis not present

## 2015-04-08 DIAGNOSIS — Z01818 Encounter for other preprocedural examination: Secondary | ICD-10-CM | POA: Diagnosis not present

## 2015-04-08 DIAGNOSIS — G20 Parkinson's disease: Secondary | ICD-10-CM | POA: Diagnosis not present

## 2015-04-09 DIAGNOSIS — G20 Parkinson's disease: Secondary | ICD-10-CM | POA: Diagnosis not present

## 2015-04-10 DIAGNOSIS — G20 Parkinson's disease: Secondary | ICD-10-CM | POA: Diagnosis not present

## 2015-05-21 DIAGNOSIS — D485 Neoplasm of uncertain behavior of skin: Secondary | ICD-10-CM | POA: Diagnosis not present

## 2015-05-21 DIAGNOSIS — L821 Other seborrheic keratosis: Secondary | ICD-10-CM | POA: Diagnosis not present

## 2015-05-21 DIAGNOSIS — D1801 Hemangioma of skin and subcutaneous tissue: Secondary | ICD-10-CM | POA: Diagnosis not present

## 2015-05-21 DIAGNOSIS — Z808 Family history of malignant neoplasm of other organs or systems: Secondary | ICD-10-CM | POA: Diagnosis not present

## 2015-05-21 DIAGNOSIS — C44319 Basal cell carcinoma of skin of other parts of face: Secondary | ICD-10-CM | POA: Diagnosis not present

## 2015-07-17 ENCOUNTER — Ambulatory Visit: Payer: Medicare Other | Attending: Neurology | Admitting: Physical Therapy

## 2015-07-17 ENCOUNTER — Ambulatory Visit: Payer: Medicare Other | Admitting: Occupational Therapy

## 2015-07-17 ENCOUNTER — Ambulatory Visit: Payer: Medicare Other

## 2015-07-17 DIAGNOSIS — R2689 Other abnormalities of gait and mobility: Secondary | ICD-10-CM | POA: Diagnosis not present

## 2015-07-17 DIAGNOSIS — R41844 Frontal lobe and executive function deficit: Secondary | ICD-10-CM

## 2015-07-17 DIAGNOSIS — R2681 Unsteadiness on feet: Secondary | ICD-10-CM | POA: Insufficient documentation

## 2015-07-17 DIAGNOSIS — R471 Dysarthria and anarthria: Secondary | ICD-10-CM | POA: Insufficient documentation

## 2015-07-17 DIAGNOSIS — R29818 Other symptoms and signs involving the nervous system: Secondary | ICD-10-CM

## 2015-07-17 DIAGNOSIS — R278 Other lack of coordination: Secondary | ICD-10-CM

## 2015-07-17 DIAGNOSIS — R29898 Other symptoms and signs involving the musculoskeletal system: Secondary | ICD-10-CM | POA: Diagnosis not present

## 2015-07-17 NOTE — Therapy (Signed)
[facility] [address] [place], [place], [address] Phone: [phone]   Fax:  [phone]  Occupational Therapy Evaluation  Patient Details  Name: [name] [name] MRN: [id number] Date of Birth: 01-17-1946 Referring Provider: Dr. Maxine Glenn  Encounter Date: 07/17/2015      OT End of Session - 07/17/15 1036    Visit Number 1   Number of Visits 17   Date for OT Re-Evaluation 09/14/15   Authorization Type Medicare   Authorization Time Period G code   Authorization - Visit Number 1   Authorization - Number of Visits 10   OT Start Time 0805   OT Stop Time 0845   OT Time Calculation (min) 40 min   Activity Tolerance Patient tolerated treatment well   Behavior During Therapy [facility] for tasks assessed/performed      Past Medical History  Diagnosis Date  . Parkinson's disease ([place])   . BPH (benign prostatic hypertrophy)   . Peyronie disease   . History of kidney stones   . Left ureteral calculus   . S/P deep brain stimulator placement     11-29-2012  . Lower urinary tract symptoms (LUTS)   . GERD (gastroesophageal reflux disease)   . Wears glasses   . Wears hearing aid     BILATERAL-- INTERMITTANT WEARS  . Renal cyst, left   . Nephrolithiasis   . Fatigue     d/t Parkinson's  . Orthostatic hypotension     d/t Parkinson's disease  . Pneumonia   . Depression   . Constipation     Past Surgical History  Procedure Laterality Date  . Extracorporeal shock wave lithotripsy Left 10-11-2010  &  01-05-2011  . Cysto/  left ureterosocpy/   left ureteral stent placement/  laser bladder stones and extraction  09-25-2010  . Deep brain stimulator placement  11-29-2012    genertor device at left pectoral area--- not activated yet  . Sp perc nephrostomy Left 12-16-2012  . Cystoscopy with retrograde pyelogram, ureteroscopy and stent placement Left 01/06/2013    Procedure: CYSTOSCOPY WITH RETROGRADE PYELOGRAM, URETEROSCOPY AND STENT  PLACEMENT;  Surgeon: Hanley Ben, MD;  Location: [facility];  Service: Urology;  Laterality: Left;  . Holmium laser application Left [id number]    Procedure: HOLMIUM LASER APPLICATION;  Surgeon: Hanley Ben, MD;  Location: [facility];  Service: Urology;  Laterality: Left;  . Stone extraction with basket Left 01/06/2013    Procedure: STONE EXTRACTION WITH BASKET;  Surgeon: Hanley Ben, MD;  Location: [facility];  Service: Urology;  Laterality: Left;  . Negative sleep study  2013  per pt  . Cystoscopy with retrograde pyelogram, ureteroscopy and stent placement Left 09/11/2014    Procedure: CYSTOSCOPY WITH LEFT RETROGRADE PYELOGRAM, URETEROSCOPY AND STENT PLACEMENT;  Surgeon: Lowella Bandy, MD;  Location: [facility];  Service: Urology;  Laterality: Left;  . Holmium laser application Left [id number]    Procedure: HOLMIUM LASER APPLICATION;  Surgeon: Lowella Bandy, MD;  Location: [facility];  Service: Urology;  Laterality: Left;  . Colonoscopy    . Cholecystectomy  12/21/2014    Procedure: LAPAROSCOPIC CHOLECYSTECTOMY;  Surgeon: Ralene Ok, MD;  Location: [facility];  Service: General;;    There were no vitals filed for this visit.      Subjective Assessment - 07/17/15 0908    Subjective  Pt with PD s/p bilateral DBS presents with decreased coordination, decreased balance, cognitive deficits, bradykinesia   Pertinent History  see EPic, bilateral DBS   Patient Stated Goals improve speed with ADLs, improve balance   Currently in Pain? Yes   Pain Score 5    Pain Location Neck   Pain Orientation Right;Left   Pain Descriptors / Indicators Aching   Pain Type Acute pain   Pain Onset More than a month ago   Aggravating Factors  malpositioning   Pain Relieving Factors heat, repositioning   Multiple Pain Sites No           [facility] OT Assessment - 07/17/15 0001    Assessment   Diagnosis Parkinson's disease, s/p DBS    Referring Provider Dr. Maxine Glenn   Onset Date --  April 2017, DBS surgery   Prior Therapy PT for sepsis   Precautions   Precautions Fall;Other (comment)  s/p DBS   Precaution Comments orthostatic BP   Balance Screen   Has the patient fallen in the past 6 months Yes   How many times? 4   Has the patient had a decrease in activity level because of a fear of falling?  No   Is the patient reluctant to leave their home because of a fear of falling?  No   Home  Environment   Family/patient expects to be discharged to: Private residence   Living Arrangements Spouse/significant other   Type of [place] to live on main level with bedroom/bathroom   Alternate Level Stairs - Number of Steps 3   Lives With Spouse   Prior Function   Level of Independence Independent with household mobility with device;Needs assistance with ADLs   Vocation Retired   Leisure photograpy, difficulty using camera, read, bowling   ADL   Eating/Feeding Modified independent  increased spills   Grooming Minimal assistance  has assist trimming beard   Upper Body Bathing Modified independent  walk in shower, with built in shower    Lower Body Bathing Increased time   Upper Body Dressing Minimal assistance;Needs assist for fasteners   Lower Body Dressing Modified independent;Increased time   Toilet Tranfer Modified independent  difficulty getting up and down at times   Tub/Shower Transfer Modified independent  walk in shower with a seat   ADL comments Pt requires increased overall time with ADLs.   Mobility   Mobility Status History of falls;Freezing  modified indpendent with cane   Written Expression   Handwriting 100% legible;Mild micrographia   Cognition   Overall Cognitive Status Impaired/Different from baseline   Area of Impairment Memory;Problem solving  Pt reports slowed processiong   Observation/Other Assessments   Other Surveys  Select   Outcome Measures PPT#2  14.75 secs, PPT#4 14.84 secs   Coordination   Gross Motor Movements are Fluid and Coordinated No   Fine Motor Movements are Fluid and Coordinated No   9 Hole Peg Test Right;Left   Right 9 Hole Peg Test 32.28 secs   Left 9 Hole Peg Test 31.07 secs   Box and Blocks RUE 38 blocks, LUE 42 blocks   Tremors right hand tremors noted    ROM / Strength   AROM / PROM / Strength AROM   AROM   Overall AROM  Within functional limits for tasks performed                           OT Short Term Goals - 07/17/15 0928    OT SHORT TERM GOAL #1  Title I with PD specific HEP   Time 4   Period Weeks   Status New   OT SHORT TERM GOAL #2   Title Pt will verbalize understanding of adapted strategies for ADLs/IADLs, including ways to minimize freezing episodes.   Time 4   Period Weeks   Status New   OT SHORT TERM GOAL #3   Title Pt will demonstrate improved ease with feeding as evidenced by decreasing PPT#2 to 11 secs or less.   Time 4   Period Weeks   Status New   OT SHORT TERM GOAL #4   Title Pt will demonstrate ability to write a paragraph with 100% legibility, and no significant decrease in letter size.   Time 4   Period Weeks   Status New           OT Long Term Goals - 07/17/15 0930    OT LONG TERM GOAL #1   Title Pt will verbalize understanding of ways to prevent future PD related complications and verbalize understanding of community resources.   Time 8   Period Weeks   Status New   OT LONG TERM GOAL #2   Title Pt will demonstrate increased ease with dressing as evidenced by decreasing 3 button/ unbutton to 35 secs or less   Baseline 39.06 secs.   Time 8   Period Weeks   Status New   OT LONG TERM GOAL #3   Title Pt will verbalize understanding of compenstaory strategies for cognitive deficits, and ways to keep thinking skills sharp.   Time 8   Period Weeks   Status New   OT LONG TERM GOAL #4   Title Pt will demonstrate improved bilateral UE functional use  as evidenced by increasing bilateral box/ blocks by 4 blocks.   Baseline RUE 38 blocks, LUE 42 blocks   Time 8   Period Weeks   Status New               Plan - 07/17/15 1029    Clinical Impression Statement Pt with Parkinson's disease, s/p bilateral DBS, with the most recent surgery on right side in April 2017(previous surgery for left side in 2014). Pt presents with decreased coordination, cognitive deficits, freezing, tremor, decreased balance, cognitive deficits which impede performance of ADLs/Iadls. Pt can benefit from skilled occupational therapy to maximize safety, functional indpendence, and quality of life.   Rehab Potential Good   OT Frequency 2x / week  plus eval   OT Duration 8 weeks   OT Treatment/Interventions Self-care/ADL training;Therapeutic exercise;Patient/family education;Balance training;Splinting;Manual Therapy;Neuromuscular education;Ultrasound;Therapeutic exercises;Therapeutic activities;DME and/or AE instruction;Parrafin;Cryotherapy;Cognitive remediation/compensation;Visual/perceptual remediation/compensation;Passive range of motion;Contrast Bath;Moist Heat;Fluidtherapy;Energy conservation;Functional Mobility Training   Plan PWR! seated or supine, coordination HEP   Consulted and Agree with Plan of Care Patient;Family member/caregiver   Family Member Consulted wife, son      Patient will benefit from skilled therapeutic intervention in order to improve the following deficits and impairments:  Abnormal gait, Decreased coordination, Decreased range of motion, Difficulty walking, Impaired flexibility, Impaired sensation, Increased edema, Decreased safety awareness, Decreased endurance, Decreased activity tolerance, Decreased knowledge of precautions, Impaired tone, Pain, Impaired UE functional use, Decreased knowledge of use of DME, Decreased balance, Decreased cognition, Decreased mobility, Decreased strength  Visit Diagnosis: Other lack of coordination - Plan:  Ot plan of care cert/re-cert  Other symptoms and signs involving the musculoskeletal system - Plan: Ot plan of care cert/re-cert  Other symptoms and signs involving the nervous system - Plan: Ot plan of care  cert/re-cert  Unsteadiness on feet - Plan: Ot plan of care cert/re-cert  Frontal lobe and executive function deficit - Plan: Ot plan of care cert/re-cert  Other abnormalities of gait and mobility - Plan: Ot plan of care cert/re-cert    Problem List Patient Active Problem List   Diagnosis Date Noted  . Acute pyelonephritis 12/16/2012  . Sepsis ([place]) 12/15/2012  . Parkinson's disease ([place]) 12/15/2012    RINE,KATHRYN 07/17/2015, 10:45 AM  [facility] [address] [place], [place], [address] Phone: [phone]   Fax:  [phone]  Name: Javionne Ruter MRN: [id number] Date of Birth: 1945-06-05

## 2015-07-17 NOTE — Therapy (Signed)
[facility] [address] [place], [place], [address] Phone: [phone]   Fax:  [phone]  Physical Therapy Evaluation  Patient Details  Name: [name] [name] MRN: [id number] Date of Birth: Apr 13, 1945 Referring Provider: Maxine Glenn  Encounter Date: 07/17/2015      PT End of Session - 07/17/15 1836    Visit Number 1   Number of Visits 17   Date for PT Re-Evaluation 09/15/15   Authorization Type Medicare/ BCBS-Gcode every 10th visit   PT Start Time 0848   PT Stop Time 0934   PT Time Calculation (min) 46 min   Activity Tolerance Patient tolerated treatment well   Behavior During Therapy [facility] for tasks assessed/performed      Past Medical History  Diagnosis Date  . Parkinson's disease ([place])   . BPH (benign prostatic hypertrophy)   . Peyronie disease   . History of kidney stones   . Left ureteral calculus   . S/P deep brain stimulator placement     11-29-2012  . Lower urinary tract symptoms (LUTS)   . GERD (gastroesophageal reflux disease)   . Wears glasses   . Wears hearing aid     BILATERAL-- INTERMITTANT WEARS  . Renal cyst, left   . Nephrolithiasis   . Fatigue     d/t Parkinson's  . Orthostatic hypotension     d/t Parkinson's disease  . Pneumonia   . Depression   . Constipation     Past Surgical History  Procedure Laterality Date  . Extracorporeal shock wave lithotripsy Left 10-11-2010  &  01-05-2011  . Cysto/  left ureterosocpy/   left ureteral stent placement/  laser bladder stones and extraction  09-25-2010  . Deep brain stimulator placement  11-29-2012    genertor device at left pectoral area--- not activated yet  . Sp perc nephrostomy Left 12-16-2012  . Cystoscopy with retrograde pyelogram, ureteroscopy and stent placement Left 01/06/2013    Procedure: CYSTOSCOPY WITH RETROGRADE PYELOGRAM, URETEROSCOPY AND STENT PLACEMENT;  Surgeon: Hanley Ben, MD;  Location: [place] [facility];   Service: Urology;  Laterality: Left;  . Holmium laser application Left [id number]    Procedure: HOLMIUM LASER APPLICATION;  Surgeon: Hanley Ben, MD;  Location: [facility];  Service: Urology;  Laterality: Left;  . Stone extraction with basket Left 01/06/2013    Procedure: STONE EXTRACTION WITH BASKET;  Surgeon: Hanley Ben, MD;  Location: [facility];  Service: Urology;  Laterality: Left;  . Negative sleep study  2013  per pt  . Cystoscopy with retrograde pyelogram, ureteroscopy and stent placement Left 09/11/2014    Procedure: CYSTOSCOPY WITH LEFT RETROGRADE PYELOGRAM, URETEROSCOPY AND STENT PLACEMENT;  Surgeon: Lowella Bandy, MD;  Location: [facility];  Service: Urology;  Laterality: Left;  . Holmium laser application Left [id number]    Procedure: HOLMIUM LASER APPLICATION;  Surgeon: Lowella Bandy, MD;  Location: [facility];  Service: Urology;  Laterality: Left;  . Colonoscopy    . Cholecystectomy  12/21/2014    Procedure: LAPAROSCOPIC CHOLECYSTECTOMY;  Surgeon: Ralene Ok, MD;  Location: [facility];  Service: General;;    There were no vitals filed for this visit.       Subjective Assessment - 07/17/15 0854    Subjective Pt is a 70 year old male who presents to OP PT with reported difficulty with balance and coordination as well as shuffling with gait.  Pt has difficulty maneuvering small spaces and tends to "fall" into his  chair.  Recent DBS in April 2017-R brain, with improvements noted in tremors.   Patient is accompained by: Family member  Wife, [name]   Pertinent History Parkinson's disease since 2008; deep brain stimulation R side (L brain) 2014, L side (R brain)April 2017   Patient Stated Goals Pt's goal for therapy is to improve strength, feel more comfortable that I won't fall when I'm walking.   Currently in Pain? Yes   Pain Score 9    Pain Location Neck   Pain Orientation Left   Pain Descriptors / Indicators Aching    Pain Type Acute pain   Pain Onset More than a month ago   Pain Frequency Intermittent   Aggravating Factors  turning to R   Pain Relieving Factors heat   Effect of Pain on Daily Activities PT will monitor, attempt to address through positioning education and exercises            [facility] PT Assessment - 07/17/15 0902    Assessment   Medical Diagnosis Parkinson's disease   Referring Provider Maxine Glenn   Onset Date/Surgical Date --  DBS April 2017   Precautions   Precautions Fall;Other (comment)  s/p bilateral DBS   Precaution Comments orthostatic BP   Balance Screen   Has the patient fallen in the past 6 months Yes   How many times? 4  2 falls at night to bathroom; near falls 10x/week   Has the patient had a decrease in activity level because of a fear of falling?  Yes   Is the patient reluctant to leave their home because of a fear of falling?  Yes   [place] Private residence   Living Arrangements Spouse/significant other   Available Help at Discharge Family   Type of [place] to enter   Entrance Stairs-Number of Steps 3   Entrance Stairs-Rails Right   [place] Two level;Able to live on main level with bedroom/bathroom  [place] - single point;Wheelchair - [employer] - 2 wheels   Additional Comments Bathroom:  20 ft from door to toilet-holds to sink   Prior Function   Level of Independence Independent with household mobility with device;Needs assistance with ADLs   Vocation Retired   Leisure Enjoyed bike riding, Warden/ranger   Posture/Postural Control Postural limitations   Postural Limitations Rounded Shoulders;Forward head   ROM / Strength   AROM / PROM / Strength Strength;AROM   AROM   Overall AROM Comments Neck rotation R:  30 degrees, L 32 degrees; neck flexion and extension WFL   Strength   Overall Strength Within functional limits for tasks performed    Overall Strength Comments Grossly tested at least 4/5 bilateral lower extremities   Bed Mobility   Bed Mobility --  Reports difficulty maneuvering in bed   Transfers   Transfers Sit to Stand;Stand to Sit   Sit to Stand 4: Min guard;Without upper extremity assist;From chair/3-in-1   Five time sit to stand comments  20.11  posterior lean   Stand to Sit 5: Supervision;4: Min guard;Without upper extremity assist;To chair/3-in-1   Ambulation/Gait   Ambulation/Gait Yes   Ambulation/Gait Assistance 5: Supervision   Ambulation/Gait Assistance Details With cues for increased step length and big movement pattern, pt has improved foot clearance, heelstrike and step length.   Ambulation Distance (Feet) 150 Feet   Assistive device None  pt chose not to use cane  during eval   Gait Pattern Step-through pattern;Decreased arm swing - right;Decreased arm swing - left;Decreased step length - right;Decreased step length - left;Shuffle;Trunk flexed;Narrow base of support;Decreased trunk rotation;Poor foot clearance - left;Poor foot clearance - right  improves with cues   Ambulation Surface Level;Indoor   Gait velocity 14.05 sec = 2.33 ft/sec  9.93 sec (3.30 ft/sec) with"BIG" cues   Standardized Balance Assessment   Standardized Balance Assessment Timed Up and Go Test   Timed Up and Go Test   Normal TUG (seconds) 15.95   Manual TUG (seconds) 17.39   Cognitive TUG (seconds) 13.06  uncontrolled descent into sitting   TUG Comments Scores >13.5-15 seconds indicates increased fall risk)   High Level Balance   High Level Balance Comments Posterior push and release test:  >3 steps with therapist assistance; anterior push and release:  >2 steps with therapist assist   Functional Gait  Assessment   Gait assessed  Yes   Gait Level Surface Walks 20 ft in less than 7 sec but greater than 5.5 sec, uses assistive device, slower speed, mild gait deviations, or deviates 6-10 in outside of the 12 in walkway width.   6.71   Change in Gait Speed Able to change speed, demonstrates mild gait deviations, deviates 6-10 in outside of the 12 in walkway width, or no gait deviations, unable to achieve a major change in velocity, or uses a change in velocity, or uses an assistive device.   Gait with Horizontal Head Turns Performs head turns with moderate changes in gait velocity, slows down, deviates 10-15 in outside 12 in walkway width but recovers, can continue to walk.   Gait with Vertical Head Turns Performs task with moderate change in gait velocity, slows down, deviates 10-15 in outside 12 in walkway width but recovers, can continue to walk.   Gait and Pivot Turn Pivot turns safely within 3 sec and stops quickly with no loss of balance.   Step Over Obstacle Is able to step over one shoe box (4.5 in total height) but must slow down and adjust steps to clear box safely. May require verbal cueing.   Gait with Narrow Base of Support Ambulates 4-7 steps.   Gait with Eyes Closed Cannot walk 20 ft without assistance, severe gait deviations or imbalance, deviates greater than 15 in outside 12 in walkway width or will not attempt task.   Ambulating Backwards Walks 20 ft, slow speed, abnormal gait pattern, evidence for imbalance, deviates 10-15 in outside 12 in walkway width.  24.81 sec   Steps Alternating feet, must use rail.   Total Score 14   FGA comment: Scores <22/30 indicates increased fall risk.                           PT Education - 07/17/15 1833    Education provided Yes   Education Details Sleep positioning with pillows, gentle ROM with head turns; importance of exercise in PD   Person(s) Educated Patient;Spouse   Methods Explanation;Demonstration;Handout   Comprehension Verbalized understanding          PT Short Term Goals - 07/17/15 1844    PT SHORT TERM GOAL #1   Title Pt will perform HEP for improved balance, transfers, gait and posture/decreased pain.  TARGET 08/15/15   Time 4    Period Weeks   Status New   PT SHORT TERM GOAL #2   Title Pt will improve 5x sit<>stand to less than or  equal to 16 seconds for improved transfer efficiency and safety.   Time 4   Period Weeks   Status New   PT SHORT TERM GOAL #3   Title Pt will improve TUG score to less than or equal to 13.5 seconds for decreased fall risk.   Time 4   Period Weeks   Status New   PT SHORT TERM GOAL #4   Title Pt will improve Functional Gait Assessment to at least 18/30 for decreased fall risk.   Time 4   Period Weeks   Status New   PT SHORT TERM GOAL #5   Title Pt/family will verbalize understanding of local Parkinson's disease resources.   Time 4   Period Weeks   Status New           PT Long Term Goals - 07/17/15 1849    PT LONG TERM GOAL #1   Title Pt will verbalize/demonstrate understanding of tips to reduce freezing with gait and turns.  TARGET 09/16/15   Time 8   Period Weeks   Status New   PT LONG TERM GOAL #2   Title Pt will improve 5x sit<>stand to less than or equal to 14 secondsf or improved efficiency and safety with transfers.   Time 8   Period Weeks   Status New   PT LONG TERM GOAL #3   Title Pt will improve TUG manual to less than or equal to 15 seconds for improved dual tasking with gait.   Time 8   Period Weeks   Status New   PT LONG TERM GOAL #4   Title PT will improve Fucntional Gait assessment to at least 20/30 for decreased fall risk.   Time 8   Period Weeks   Status New   PT LONG TERM GOAL #5   Title Pt will improve gait velocity to at elast 2.62 ft/sec for imrpoved gait efficiency and safety.   Time 8   Period Weeks   Status New   Additional Long Term Goals   Additional Long Term Goals Yes   PT LONG TERM GOAL #6   Title Pt will verbalize plans for continued community fitness upon D/C from PT.   Time 8   Period Weeks   Status New               Plan - 07/17/15 1837    Clinical Impression Statement Pt is a 70 year old male who presents to OP  PT with history of Parkinson's disease and bilateral deep brain stimulation, with decreased timing and coordination of gait, freezing/festination of gait, decreased functional strength, bradykinesia, decreased balance, abnormal posture, neck pain/stiffness.  Pt noted improvement in mobility and reduction in tremors s/p deep brain stimulation surgery; however, pt notes decreased balance and at least 4 falls in the past 6 months.  Pt is at high fall risk per FGA, TUG and has decreased gait velocity.  Pt's deficits are affecting daily activities, bathroom management and safety with gait.  Pt would beneift from skilled PT to address the above stated deficits for improved functional mobility and decreased fall risk.   Rehab Potential Good   PT Frequency 2x / week   PT Duration 8 weeks  plus eval   PT Treatment/Interventions ADLs/Self Care Home Management;Therapeutic exercise;Therapeutic activities;Functional mobility training;Stair training;Gait training;DME Instruction;Balance training;Neuromuscular re-education;Patient/family education   PT Next Visit Plan Initiate HEP for PWR! Moves sitting, standing; transfer training, turns, tips to reduce freezing with gait and turns   Consulted  and Agree with Plan of Care Patient;Family member/caregiver   Family Member Consulted wife      Patient will benefit from skilled therapeutic intervention in order to improve the following deficits and impairments:  Abnormal gait, Decreased balance, Decreased mobility, Decreased range of motion, Decreased safety awareness, Decreased strength, Difficulty walking, Postural dysfunction, Pain  Visit Diagnosis: Other symptoms and signs involving the nervous system  Other abnormalities of gait and mobility  Unsteadiness on feet      G-Codes - 01-Aug-2015 1853    Functional Assessment Tool Used FGA 14/30, gait velocity 2.33 ft/sec, TUG 15.95 sec, TUG manual 17.39 sec, 5x sit<>stand 20.11 sec, 4 falls in the past 6 months    Functional Limitation Mobility: Walking and moving around   Mobility: Walking and Moving Around Current Status [phone]) At least 40 percent but less than 60 percent impaired, limited or restricted   Mobility: Walking and Moving Around Goal Status [phone]) At least 20 percent but less than 40 percent impaired, limited or restricted       Problem List Patient Active Problem List   Diagnosis Date Noted  . Acute pyelonephritis 12/16/2012  . Sepsis ([place]) 12/15/2012  . Parkinson's disease ([place]) 12/15/2012    MARRIOTT,AMY W. August 01, 2015, 6:57 PM  Frazier Butt., PT  [facility] [address] [place], [place], [address] Phone: [phone]   Fax:  [phone]  Name: [name] [name] MRN: [id number] Date of Birth: 10-11-1945

## 2015-07-18 NOTE — Patient Instructions (Signed)
Complete 5 loud "ah" twice a day

## 2015-07-18 NOTE — Therapy (Signed)
[name] [address] [place], [place], [address] Phone: [phone]   Fax:  [phone]  Speech Language Pathology Evaluation  Patient Details  Name: [name] [name] MRN: [id number] Date of Birth: [birth year] No Data Recorded  Encounter Date: 07/17/2015      End of Session - 07/18/15 0837    Visit Number 1   Number of Visits 17   Date for SLP Re-Evaluation 09/27/15   SLP Start Time 1103   SLP Stop Time  1145   SLP Time Calculation (min) 42 min   Activity Tolerance Patient tolerated treatment well      Past Medical History  Diagnosis Date  . Parkinson's disease ([place])   . BPH (benign prostatic hypertrophy)   . Peyronie disease   . History of kidney stones   . Left ureteral calculus   . S/P deep brain stimulator placement     11-29-2012  . Lower urinary tract symptoms (LUTS)   . GERD (gastroesophageal reflux disease)   . Wears glasses   . Wears hearing aid     BILATERAL-- INTERMITTANT WEARS  . Renal cyst, left   . Nephrolithiasis   . Fatigue     d/t Parkinson's  . Orthostatic hypotension     d/t Parkinson's disease  . Pneumonia   . Depression   . Constipation     Past Surgical History  Procedure Laterality Date  . Extracorporeal shock wave lithotripsy Left 10-11-2010  &  01-05-2011  . Cysto/  left ureterosocpy/   left ureteral stent placement/  laser bladder stones and extraction  09-25-2010  . Deep brain stimulator placement  11-29-2012    genertor device at left pectoral area--- not activated yet  . Sp perc nephrostomy Left 12-16-2012  . Cystoscopy with retrograde pyelogram, ureteroscopy and stent placement Left 01/06/2013    Procedure: CYSTOSCOPY WITH RETROGRADE PYELOGRAM, URETEROSCOPY AND STENT PLACEMENT;  Surgeon: Hanley Ben, MD;  Location: [facility];  Service: Urology;  Laterality: Left;  . Holmium laser application Left [id number]    Procedure: HOLMIUM LASER APPLICATION;   Surgeon: Hanley Ben, MD;  Location: [facility];  Service: Urology;  Laterality: Left;  . Stone extraction with basket Left 01/06/2013    Procedure: STONE EXTRACTION WITH BASKET;  Surgeon: Hanley Ben, MD;  Location: [facility];  Service: Urology;  Laterality: Left;  . Negative sleep study  2013  per pt  . Cystoscopy with retrograde pyelogram, ureteroscopy and stent placement Left 09/11/2014    Procedure: CYSTOSCOPY WITH LEFT RETROGRADE PYELOGRAM, URETEROSCOPY AND STENT PLACEMENT;  Surgeon: Lowella Bandy, MD;  Location: [facility];  Service: Urology;  Laterality: Left;  . Holmium laser application Left [id number]    Procedure: HOLMIUM LASER APPLICATION;  Surgeon: Lowella Bandy, MD;  [facility];  Service: Urology;  Laterality: Left;  . Colonoscopy    . Cholecystectomy  12/21/2014    Procedure: LAPAROSCOPIC CHOLECYSTECTOMY;  Surgeon: Ralene Ok, MD;  Location: [facility];  Service: General;;    There were no vitals filed for this visit.      Subjective Assessment - 07/17/15 1111    Subjective Pt reports decr in speech function (reduced loudness, incr'd speech rate, reduced vocal quality) since rt DBS.   Patient is accompained by: --  wife   Currently in Pain? No/denies            SLP Evaluation [facility] - 07/18/15 0833    Pain Assessment  Currently in Pain? No/denies   Pain Score 5    Pain Location Neck   Pain Orientation Right;Left   Pain Type Acute pain   Pain Onset More than a month ago   Pain Relieving Factors heat / repositioning   General Information   HPI Diagnosed 2008, with tremor and reduced volume prior to 2008   Oral Motor/Sensory Function   Overall Oral Motor/Sensory Function Impaired   Labial ROM Within Functional Limits   Labial Symmetry Within Functional Limits   Labial Strength Reduced Right   Labial Coordination Reduced  after multiple reps   Lingual ROM Reduced right  slightly   Lingual  Symmetry Within Functional Limits   Lingual Strength Reduced Right   Velum Within Functional Limits   Motor Speech   Overall Motor Speech Impaired   Respiration Impaired   Level of Impairment Sentence   Phonation Low vocal intensity;Hoarse;Breathy  67dB avg 10 min with rushes of speech;mod hoarseness   Articulation Impaired   Level of Impairment Phrase   Intelligibility Intelligibility reduced   Word Not tested   Conversation --  approx 95%   Effective Techniques Increased vocal intensity;Over-articulate      After loud /a/ technique done in ST eval x4, pt's voice quality changed to mild hoarseness. His volume, when told to think about using the same effort as with loud /a/, improved to 70dB in simple conversation without cues from SLP.                   SLP Education - 07/18/15 [phone]    Education provided Yes   Education Details loud /a/, pt with abdominal push needed for improved volume and vocal quality   Person(s) Educated Patient;Spouse   Methods Explanation;Demonstration;Verbal cues   Comprehension Verbalized understanding;Returned demonstration;Need further instruction;Verbal cues required          SLP Short Term Goals - 07/18/15 0839    SLP SHORT TERM GOAL #1   Title pt will sustain /a/ average 88dB over 3 sessions   Time 4   Period Weeks   Status New   SLP SHORT TERM GOAL #2   Title pt will  maintain 70dB loudness in 19/20 sentence responses   Time 4   Period Weeks   Status New   SLP SHORT TERM GOAL #3   Title pt will tell SLP 3 s/s aspiration PNA with modified independence   Time 4   Period Weeks   Status New          SLP Long Term Goals - 07/18/15 0841    SLP LONG TERM GOAL #1   Title pt will sustsin /a/ average 89dB over 4 sessions   Time 8   Period Weeks   Status New   SLP LONG TERM GOAL #2   Title pt will engage in 10 minutes mod complex conversation with average 70dB   Time 8   Period Weeks   Status New   SLP LONG TERM GOAL  #3   Title pt will report improved vocal quality in 10 minutes mod complex conversation, compared to first session ST   Time 8   Period Weeks   Status New          Plan - 07/18/15 [id number]    Clinical Impression Statement Pt presents with mild mod dysarthria c/b imprecision of phonemes, rushes of speech, and mod hoarseness due to Parkinson's disease.   Speech Therapy Frequency 2x / week   Duration --  8 weeks  Treatment/Interventions SLP instruction and feedback;Compensatory strategies;Internal/external aids;Patient/family education;Functional tasks;Cueing hierarchy   Potential to Achieve Goals Good   SLP Home Exercise Plan provided today   Consulted and Agree with Plan of Care Patient      Patient will benefit from skilled therapeutic intervention in order to improve the following deficits and impairments:   Dysarthria and anarthria      G-Codes - 07/20/2015 0843    Functional Assessment Tool Used noms - 5   Functional Limitations Motor speech   Motor Speech Current Status [phone]) At least 20 percent but less than 40 percent impaired, limited or restricted   Motor Speech Goal Status [id number]) At least 1 percent but less than 20 percent impaired, limited or restricted      Problem List Patient Active Problem List   Diagnosis Date Noted  . Acute pyelonephritis 12/16/2012  . Sepsis ([place]) 12/15/2012  . Parkinson's disease ([place]) 12/15/2012    [facility] ,[place], [place]  07-20-2015, 8:44 AM  [facility] [address] [place], [place], [address] Phone: [phone]   Fax:  [phone]  Name: [name] [name] MRN: [id number] Date of Birth: Aug 15, 1945

## 2015-07-24 ENCOUNTER — Ambulatory Visit: Payer: Medicare Other | Admitting: Physical Therapy

## 2015-07-24 DIAGNOSIS — R29818 Other symptoms and signs involving the nervous system: Secondary | ICD-10-CM | POA: Diagnosis not present

## 2015-07-24 DIAGNOSIS — R2689 Other abnormalities of gait and mobility: Secondary | ICD-10-CM | POA: Diagnosis not present

## 2015-07-24 DIAGNOSIS — R278 Other lack of coordination: Secondary | ICD-10-CM | POA: Diagnosis not present

## 2015-07-24 DIAGNOSIS — R2681 Unsteadiness on feet: Secondary | ICD-10-CM | POA: Diagnosis not present

## 2015-07-24 DIAGNOSIS — R29898 Other symptoms and signs involving the musculoskeletal system: Secondary | ICD-10-CM | POA: Diagnosis not present

## 2015-07-24 DIAGNOSIS — R471 Dysarthria and anarthria: Secondary | ICD-10-CM | POA: Diagnosis not present

## 2015-07-24 NOTE — Patient Instructions (Signed)
Sit to Stand Transfers:  1. Scoot out to the edge of the chair 2. Place your feet flat on the floor, shoulder width apart.  Make sure your feet are tucked just under your knees. 3. Lean forward (nose over toes) with momentum, and stand up tall with your best posture.  If you need to use your arms, use them as a quick boost up to stand. 4. If you are in a low or soft chair, you can lean back and then forward up to stand, in order to get more momentum. 5. Once you are standing, make sure you are looking ahead and standing tall.  To sit down:  1. Back up until you feel the chair behind your legs. 2. Bend at you hips, reaching  Back for you chair, if needed, then slowly squat to sit down on your chair.   Tips to reduce freezing episodes with standing or walking:  6. Stand tall with your feet wide, so that you can rock and weight shift through your hips. 7. Don't try to fight the freeze: if you begin taking slower, faster, smaller steps, STOP, get your posture tall, and RESET your posture and balance.  Take a deep breath before taking the BIG step to start again. 8. March in place, with high knee stepping, to get started walking again. 9. Use auditory cues:  Count out loud, think of a familiar tune or song or cadence, use pocket metronome, to use rhythm to get started walking again. 10. Use visual cues:  Use a line to step over, use laser pointer line to step over, (using BIG steps) to start walking again. 11. Use visual targets to keep your posture tall (look ahead and focus on an object or target at eye level). 12. As you approach where your destination with walking, count your steps out loud and/or focus on your target with your eyes until you are fully there. 13. Use appropriate assistive device, as advised by your physical therapist to assist with taking longer, consistent steps.   When you turn, make sure to shift your weight side to side to lift up each foot as you turn fully around to  "square" up to the chair where you are sitting.

## 2015-07-24 NOTE — Therapy (Signed)
[facility] [address] [place], [place], [address] Phone: [phone]   Fax:  [phone]  Physical Therapy Treatment  Patient Details  Name: [name] [name] MRN: [id number] Date of Birth: October 29, 1945 Referring Provider: Maxine Glenn  Encounter Date: 07/24/2015      PT End of Session - 07/24/15 1058    Visit Number 2   Number of Visits 17   Date for PT Re-Evaluation 09/15/15   Authorization Type Medicare/ BCBS-Gcode every 10th visit   PT Start Time 0848   PT Stop Time 0935   PT Time Calculation (min) 47 min   Activity Tolerance Patient tolerated treatment well   Behavior During Therapy [name] for tasks assessed/performed      Past Medical History  Diagnosis Date  . Parkinson's disease ([place])   . BPH (benign prostatic hypertrophy)   . Peyronie disease   . History of kidney stones   . Left ureteral calculus   . S/P deep brain stimulator placement     11-29-2012  . Lower urinary tract symptoms (LUTS)   . GERD (gastroesophageal reflux disease)   . Wears glasses   . Wears hearing aid     BILATERAL-- INTERMITTANT WEARS  . Renal cyst, left   . Nephrolithiasis   . Fatigue     d/t Parkinson's  . Orthostatic hypotension     d/t Parkinson's disease  . Pneumonia   . Depression   . Constipation     Past Surgical History  Procedure Laterality Date  . Extracorporeal shock wave lithotripsy Left 10-11-2010  &  01-05-2011  . Cysto/  left ureterosocpy/   left ureteral stent placement/  laser bladder stones and extraction  09-25-2010  . Deep brain stimulator placement  11-29-2012    genertor device at left pectoral area--- not activated yet  . Sp perc nephrostomy Left 12-16-2012  . Cystoscopy with retrograde pyelogram, ureteroscopy and stent placement Left 01/06/2013    Procedure: CYSTOSCOPY WITH RETROGRADE PYELOGRAM, URETEROSCOPY AND STENT PLACEMENT;  Surgeon: Hanley Ben, MD;  Location: [place];   Service: Urology;  Laterality: Left;  . Holmium laser application Left [id number]    Procedure: HOLMIUM LASER APPLICATION;  Surgeon: Hanley Ben, MD;  Location: [facility];  Service: Urology;  Laterality: Left;  . Stone extraction with basket Left 01/06/2013    Procedure: STONE EXTRACTION WITH BASKET;  Surgeon: Hanley Ben, MD;  Location: [facility];  Service: Urology;  Laterality: Left;  . Negative sleep study  2013  per pt  . Cystoscopy with retrograde pyelogram, ureteroscopy and stent placement Left 09/11/2014    Procedure: CYSTOSCOPY WITH LEFT RETROGRADE PYELOGRAM, URETEROSCOPY AND STENT PLACEMENT;  Surgeon: Lowella Bandy, MD;  Location: [facility];  Service: Urology;  Laterality: Left;  . Holmium laser application Left [id number]    Procedure: HOLMIUM LASER APPLICATION;  Surgeon: Lowella Bandy, MD;  Location: [facility];  Service: Urology;  Laterality: Left;  . Colonoscopy    . Cholecystectomy  12/21/2014    Procedure: LAPAROSCOPIC CHOLECYSTECTOMY;  Surgeon: Ralene Ok, MD;  Location: [place];  Service: General;;    There were no vitals filed for this visit.      Subjective Assessment - 07/24/15 0851    Subjective Went to see Dr. Maxine Glenn yesterday.  He adjusted the DBS setting yesterday for my speech, and he recommended counseling for the apathy.   Patient is accompained by: Family member  wife   Pertinent History Parkinson's disease  since 2008; deep brain stimulation R side (L brain) 2014, L side (R brain)April 2017   Patient Stated Goals Pt's goal for therapy is to improve strength, feel more comfortable that I won't fall when I'm walking.   Currently in Pain? No/denies                         [facility] Adult PT Treatment/Exercise - 07/24/15 0001    Transfers   Transfers Sit to Stand;Stand to Sit   Sit to Stand 4: Min guard;5: Supervision   Sit to Stand Details (indicate cue type and reason) Provided cues  for proper sit<>stand technique, including foot and hand placement, scooting forward in chair and use of momentum to stand   Stand to Sit 5: Supervision   Number of Reps Other reps (comment);Other sets (comment)  5 reps each 22", 18", 17" chairs   Transfer Cueing Cues provided for initial weightshifting in order to safely complete turn to sit at chair in narrow space.  Practiced at least 5 reps with cues and supervision during session.   Comments Practiced (as noted above with cues) turns to sit at various chairs in small spaces.  Practiced scooting up to and away from table, with cues for increased forward lean for improved pushing and pulling chair for scootin up to and away from table more efficiently.   Ambulation/Gait   Ambulation/Gait Yes   Ambulation/Gait Assistance 5: Supervision   Ambulation/Gait Assistance Details Gait with bilateral walking poles to facilitate reciprocal arm swing with cues provided for improved step length and foot clearance.  Pt needs cues to slow pace slightly in order to coordinate arm swing.   Ambulation Distance (Feet) 150 Feet  x 2, 350 ft with turns/negotiating furniture w/o poles   Assistive device None  bilateral walking poles   Gait Pattern Step-through pattern;Decreased arm swing - right;Decreased arm swing - left;Decreased step length - right;Decreased step length - left;Shuffle;Trunk flexed;Narrow base of support;Decreased trunk rotation;Poor foot clearance - left;Poor foot clearance - right  Needs cues to slow gait pace   Ambulation Surface Level;Indoor   Gait Comments Gait is improved with cues to increase step length, heelstrike and to "decrease squeaking" with shoes   Self-Care   Self-Care Other Self-Care Comments   Other Self-Care Comments  Reviewed with pt/wife info from Dr.'s visit yesterday (request for counseling due to increased apathy, changes in DBS setting); discussed neuropsychologist possibilities in [place]; provided pt with tips to  reduce freezing episodes with gait                PT Education - 07/24/15 1057    Education provided Yes   Education Details Deliberate movement patterns; tips to reduce freezing with gait, technique for sit<>stand transfers   Person(s) Educated Patient;Spouse   Methods Explanation;Demonstration;Handout   Comprehension Verbalized understanding;Returned demonstration;Verbal cues required;Need further instruction          PT Short Term Goals - 07/17/15 1844    PT SHORT TERM GOAL #1   Title Pt will perform HEP for improved balance, transfers, gait and posture/decreased pain.  TARGET 08/15/15   Time 4   Period Weeks   Status New   PT SHORT TERM GOAL #2   Title Pt will improve 5x sit<>stand to less than or equal to 16 seconds for improved transfer efficiency and safety.   Time 4   Period Weeks   Status New   PT SHORT TERM GOAL #3   Title  Pt will improve TUG score to less than or equal to 13.5 seconds for decreased fall risk.   Time 4   Period Weeks   Status New   PT SHORT TERM GOAL #4   Title Pt will improve Functional Gait Assessment to at least 18/30 for decreased fall risk.   Time 4   Period Weeks   Status New   PT SHORT TERM GOAL #5   Title Pt/family will verbalize understanding of local Parkinson's disease resources.   Time 4   Period Weeks   Status New           PT Long Term Goals - 07/17/15 1849    PT LONG TERM GOAL #1   Title Pt will verbalize/demonstrate understanding of tips to reduce freezing with gait and turns.  TARGET 09/16/15   Time 8   Period Weeks   Status New   PT LONG TERM GOAL #2   Title Pt will improve 5x sit<>stand to less than or equal to 14 secondsf or improved efficiency and safety with transfers.   Time 8   Period Weeks   Status New   PT LONG TERM GOAL #3   Title Pt will improve TUG manual to less than or equal to 15 seconds for improved dual tasking with gait.   Time 8   Period Weeks   Status New   PT LONG TERM GOAL #4    Title PT will improve Fucntional Gait assessment to at least 20/30 for decreased fall risk.   Time 8   Period Weeks   Status New   PT LONG TERM GOAL #5   Title Pt will improve gait velocity to at elast 2.62 ft/sec for imrpoved gait efficiency and safety.   Time 8   Period Weeks   Status New   Additional Long Term Goals   Additional Long Term Goals Yes   PT LONG TERM GOAL #6   Title Pt will verbalize plans for continued community fitness upon D/C from PT.   Time 8   Period Weeks   Status New               Plan - 07/24/15 1058    Clinical Impression Statement Initiated functional activities today with sit<>stand, turns in small spaces, tips to reduce freezing and to assist with chair transfers at table.  Pt demonstrates improved movement patterns with cues for large, deliberate movement patterns.  Pt will continue to benefit from further skilled PT to address balance, gait and transfers.   Rehab Potential Good   PT Frequency 2x / week   PT Duration 8 weeks  plus eval   PT Treatment/Interventions ADLs/Self Care Home Management;Therapeutic exercise;Therapeutic activities;Functional mobility training;Stair training;Gait training;DME Instruction;Balance training;Neuromuscular re-education;Patient/family education   PT Next Visit Plan Initiate HEP for PWR! Moves sitting, standing; Review transfer training, turns, tips to reduce freezing with gait and turns   Consulted and Agree with Plan of Care Patient;Family member/caregiver   Family Member Consulted wife      Patient will benefit from skilled therapeutic intervention in order to improve the following deficits and impairments:  Abnormal gait, Decreased balance, Decreased mobility, Decreased range of motion, Decreased safety awareness, Decreased strength, Difficulty walking, Postural dysfunction, Pain  Visit Diagnosis: Other abnormalities of gait and mobility  Unsteadiness on feet     Problem List Patient Active Problem  List   Diagnosis Date Noted  . Acute pyelonephritis 12/16/2012  . Sepsis ([place]) 12/15/2012  . Parkinson's disease ([place]) 12/15/2012  [name] [name] W. 07/24/2015, 11:02 AM  Frazier Butt., PT  [facility] [address] [place], [place], [address] Phone: [phone]   Fax:  [phone]  Name: [name] [name] MRN: [id number] Date of Birth: 1945-03-03

## 2015-07-26 ENCOUNTER — Ambulatory Visit: Payer: Medicare Other | Admitting: Physical Therapy

## 2015-07-26 DIAGNOSIS — R29898 Other symptoms and signs involving the musculoskeletal system: Secondary | ICD-10-CM | POA: Diagnosis not present

## 2015-07-26 DIAGNOSIS — R471 Dysarthria and anarthria: Secondary | ICD-10-CM | POA: Diagnosis not present

## 2015-07-26 DIAGNOSIS — R2689 Other abnormalities of gait and mobility: Secondary | ICD-10-CM

## 2015-07-26 DIAGNOSIS — R278 Other lack of coordination: Secondary | ICD-10-CM | POA: Diagnosis not present

## 2015-07-26 DIAGNOSIS — R29818 Other symptoms and signs involving the nervous system: Secondary | ICD-10-CM | POA: Diagnosis not present

## 2015-07-26 DIAGNOSIS — R2681 Unsteadiness on feet: Secondary | ICD-10-CM | POA: Diagnosis not present

## 2015-07-26 NOTE — Patient Instructions (Signed)
Provided patient with handout for PWR! Moves in sitting: PWR! Up x 20  PWR! Rock x 20 PWR! Twist x 20 PWR! Step x 20  To be performed 1-2 times daily

## 2015-07-26 NOTE — Therapy (Signed)
[facility] [address] [place], [place], [address] Phone: [phone]   Fax:  [phone]  Physical Therapy Treatment  Patient Details  Name: [name] [name] MRN: [id number] Date of Birth: Jun 16, 1945 Referring Provider: Maxine Glenn  Encounter Date: 07/26/2015      PT End of Session - 07/26/15 0859    Visit Number 3   Number of Visits 17   Date for PT Re-Evaluation 09/15/15   Authorization Type Medicare/ BCBS-Gcode every 10th visit   PT Start Time 0803   PT Stop Time 0846   PT Time Calculation (min) 43 min   Equipment Utilized During Treatment Gait belt   Activity Tolerance Patient tolerated treatment well   Behavior During Therapy [facility] for tasks assessed/performed      Past Medical History  Diagnosis Date  . Parkinson's disease ([place])   . BPH (benign prostatic hypertrophy)   . Peyronie disease   . History of kidney stones   . Left ureteral calculus   . S/P deep brain stimulator placement     11-29-2012  . Lower urinary tract symptoms (LUTS)   . GERD (gastroesophageal reflux disease)   . Wears glasses   . Wears hearing aid     BILATERAL-- INTERMITTANT WEARS  . Renal cyst, left   . Nephrolithiasis   . Fatigue     d/t Parkinson's  . Orthostatic hypotension     d/t Parkinson's disease  . Pneumonia   . Depression   . Constipation     Past Surgical History  Procedure Laterality Date  . Extracorporeal shock wave lithotripsy Left 10-11-2010  &  01-05-2011  . Cysto/  left ureterosocpy/   left ureteral stent placement/  laser bladder stones and extraction  09-25-2010  . Deep brain stimulator placement  11-29-2012    genertor device at left pectoral area--- not activated yet  . Sp perc nephrostomy Left 12-16-2012  . Cystoscopy with retrograde pyelogram, ureteroscopy and stent placement Left 01/06/2013    Procedure: CYSTOSCOPY WITH RETROGRADE PYELOGRAM, URETEROSCOPY AND STENT PLACEMENT;  Surgeon: Hanley Ben,  MD;  Location: [facility];  Service: Urology;  Laterality: Left;  . Holmium laser application Left [id number]    Procedure: HOLMIUM LASER APPLICATION;  Surgeon: Hanley Ben, MD;  Location: [facility];  Service: Urology;  Laterality: Left;  . Stone extraction with basket Left 01/06/2013    Procedure: STONE EXTRACTION WITH BASKET;  Surgeon: Hanley Ben, MD;  Location: [facility];  Service: Urology;  Laterality: Left;  . Negative sleep study  2013  per pt  . Cystoscopy with retrograde pyelogram, ureteroscopy and stent placement Left 09/11/2014    Procedure: CYSTOSCOPY WITH LEFT RETROGRADE PYELOGRAM, URETEROSCOPY AND STENT PLACEMENT;  Surgeon: Lowella Bandy, MD;  Location: [facility];  Service: Urology;  Laterality: Left;  . Holmium laser application Left [id number]    Procedure: HOLMIUM LASER APPLICATION;  Surgeon: Lowella Bandy, MD;  Location: [facility];  Service: Urology;  Laterality: Left;  . Colonoscopy    . Cholecystectomy  12/21/2014    Procedure: LAPAROSCOPIC CHOLECYSTECTOMY;  Surgeon: Ralene Ok, MD;  Location: [facility];  Service: General;;    There were no vitals filed for this visit.      Subjective Assessment - 07/26/15 0814    Subjective From fall about 6 weeks ago, having some pain in L hip upon weightbearing.  Sometimes feel like it may give way.  Transfer technique helps alot.  Wife reports haven't  heard much squeaking these past few days.   Patient is accompained by: Family member  wife   Pertinent History Parkinson's disease since 2008; deep brain stimulation R side (L brain) 2014, L side (R brain)April 2017   Patient Stated Goals Pt's goal for therapy is to improve strength, feel more comfortable that I won't fall when I'm walking.   Currently in Pain? Yes   Pain Score 4    Pain Location Hip   Pain Orientation Left   Pain Descriptors / Indicators Aching   Pain Type Acute pain  6 weeks after fall    Aggravating Factors  bowling too much, sitting too long   Pain Relieving Factors moving around more                         [facility] Adult PT Treatment/Exercise - 07/26/15 0001    Transfers   Transfers Sit to Stand;Stand to Sit   Sit to Stand 6: Modified independent (Device/Increase time)   Stand to Sit 6: Modified independent (Device/Increase time)   Comments Brief review of transfer training from last visit-pt return demo understanding and reports improvement in transfers at home.  Pt does continue to perform at slowed pace, but with improved technique.   Ambulation/Gait   Ambulation/Gait Yes   Ambulation/Gait Assistance 5: Supervision   Ambulation/Gait Assistance Details Gait training with cane today, as patient reports L hip pain/discomfort upon initial standing.  PT provides verbal and tactile cues for cane sequence with R hand and LLE.  Pt needs multiple times of resetting sequence due to holding cane too long during L swing phase of gait.   Ambulation Distance (Feet) 120 Feet  x 3 with cane; 100 ft end of session   Assistive device Straight cane   Gait Pattern Step-through pattern;Decreased arm swing - left;Decreased step length - right;Decreased step length - left;Decreased trunk rotation;Trunk flexed  improved foot clearance, difficulty sequencing cane   Ambulation Surface Level;Indoor           PWR [facility]) - 07/26/15 [id number]    PWR! exercises Moves in sitting   PWR! Up x 10   PWR! Rock x 10   PWR! Twist x 10   PWR! Step x 10   Comments PT provides verbal and visual cues for technique, as well as cues for intense, large amplitude, deliberate movements with exercises.      Discussed functional daily activities with each PWR! Move:  PWR! Up for posture, PWR! Rock for [employer], [place]! Twist for trunk flexibility and PWR! STep for initial transition step for getting in and out of car/tub.    Self Care:  Pt presents to therapy today reporting pain in L hip,  questioning if he should get it checked out further.  Report pain is from fall about 6 weeks ago where he fell in bedroom and landed on L hip.  Pt c/o pain in L femoral head area and reports pain is worse when he first stands up and bears weight, with pain subsiding after being up and moving. No overall improvement in pain since initial fall.  He reports at times he can't trust L leg, like it might give way.  Palpated L hip area, including around femoral head, IT band, piriformis/glut area, with no increase in pain with palpation.  No increase in pain with internal/external P/ROM hip rotation with L extended, but slight increase in pain with internal rotation with knee flexed.  Denies radiating symptoms into  LLE.  PT recommends pt follow up with physician to for further work-up, as concern with initial weightbearing and pain has not overall improved since fall 6 weeks ago.  Pt feels he is okay to proceed with PT tx today.      PT Education - 07/26/15 0858    Education provided Yes   Education Details HEP initiated-PWR! Moves in sitting   Person(s) Educated Patient;Spouse   Methods Explanation;Demonstration;Handout   Comprehension Verbalized understanding;Returned demonstration;Verbal cues required;Need further instruction          PT Short Term Goals - 07/17/15 1844    PT SHORT TERM GOAL #1   Title Pt will perform HEP for improved balance, transfers, gait and posture/decreased pain.  TARGET 08/15/15   Time 4   Period Weeks   Status New   PT SHORT TERM GOAL #2   Title Pt will improve 5x sit<>stand to less than or equal to 16 seconds for improved transfer efficiency and safety.   Time 4   Period Weeks   Status New   PT SHORT TERM GOAL #3   Title Pt will improve TUG score to less than or equal to 13.5 seconds for decreased fall risk.   Time 4   Period Weeks   Status New   PT SHORT TERM GOAL #4   Title Pt will improve Functional Gait Assessment to at least 18/30 for decreased fall risk.    Time 4   Period Weeks   Status New   PT SHORT TERM GOAL #5   Title Pt/family will verbalize understanding of local Parkinson's disease resources.   Time 4   Period Weeks   Status New           PT Long Term Goals - 07/17/15 1849    PT LONG TERM GOAL #1   Title Pt will verbalize/demonstrate understanding of tips to reduce freezing with gait and turns.  TARGET 09/16/15   Time 8   Period Weeks   Status New   PT LONG TERM GOAL #2   Title Pt will improve 5x sit<>stand to less than or equal to 14 secondsf or improved efficiency and safety with transfers.   Time 8   Period Weeks   Status New   PT LONG TERM GOAL #3   Title Pt will improve TUG manual to less than or equal to 15 seconds for improved dual tasking with gait.   Time 8   Period Weeks   Status New   PT LONG TERM GOAL #4   Title PT will improve Fucntional Gait assessment to at least 20/30 for decreased fall risk.   Time 8   Period Weeks   Status New   PT LONG TERM GOAL #5   Title Pt will improve gait velocity to at elast 2.62 ft/sec for imrpoved gait efficiency and safety.   Time 8   Period Weeks   Status New   Additional Long Term Goals   Additional Long Term Goals Yes   PT LONG TERM GOAL #6   Title Pt will verbalize plans for continued community fitness upon D/C from PT.   Time 8   Period Weeks   Status New               Plan - 07/26/15 0859    Clinical Impression Statement Brief review of transfers and gait activities practiced last visit, with pt reporting improvement at home.  HEP initiated today to address large amplitude, deliberate movement patterns in sitting.  Pt voiced concerns today about L hip pain from a fall about 6 weeks ago.  Pain appears to be near femoral head and is worse with initial weightbearing.  Recommended pt seek follow up care with physician since pain is not subsiding (feels it may have been exacerbated with bowling yesterday).   Rehab Potential Good   PT Frequency 2x / week    PT Duration 8 weeks  plus eval   PT Treatment/Interventions ADLs/Self Care Home Management;Therapeutic exercise;Therapeutic activities;Functional mobility training;Stair training;Gait training;DME Instruction;Balance training;Neuromuscular re-education;Patient/family education   PT Next Visit Plan Review HEP for PWR! Moves sitting,and initiate PWR! Moves in standing; gait training with and without cane   Consulted and Agree with Plan of Care Patient;Family member/caregiver   Family Member Consulted wife      Patient will benefit from skilled therapeutic intervention in order to improve the following deficits and impairments:  Abnormal gait, Decreased balance, Decreased mobility, Decreased range of motion, Decreased safety awareness, Decreased strength, Difficulty walking, Postural dysfunction, Pain  Visit Diagnosis: Other abnormalities of gait and mobility     Problem List Patient Active Problem List   Diagnosis Date Noted  . Acute pyelonephritis 12/16/2012  . Sepsis ([place]) 12/15/2012  . Parkinson's disease ([place]) 12/15/2012    [name] Jayne W. 07/26/2015, 9:03 AM  Frazier Butt., PT  [facility] [address] [place], [place], [address] Phone: [phone]   Fax:  [phone]  Name: [name] [name] MRN: [id number] Date of Birth: 14-Feb-1945

## 2015-07-29 DIAGNOSIS — C44319 Basal cell carcinoma of skin of other parts of face: Secondary | ICD-10-CM | POA: Diagnosis not present

## 2015-07-31 ENCOUNTER — Ambulatory Visit: Payer: Medicare Other

## 2015-07-31 DIAGNOSIS — R471 Dysarthria and anarthria: Secondary | ICD-10-CM

## 2015-07-31 DIAGNOSIS — R29818 Other symptoms and signs involving the nervous system: Secondary | ICD-10-CM | POA: Diagnosis not present

## 2015-07-31 DIAGNOSIS — R2689 Other abnormalities of gait and mobility: Secondary | ICD-10-CM | POA: Diagnosis not present

## 2015-07-31 DIAGNOSIS — R2681 Unsteadiness on feet: Secondary | ICD-10-CM | POA: Diagnosis not present

## 2015-07-31 DIAGNOSIS — R29898 Other symptoms and signs involving the musculoskeletal system: Secondary | ICD-10-CM | POA: Diagnosis not present

## 2015-07-31 DIAGNOSIS — R278 Other lack of coordination: Secondary | ICD-10-CM | POA: Diagnosis not present

## 2015-07-31 NOTE — Patient Instructions (Signed)
  Please complete the assigned speech therapy homework and return it to your next session.  

## 2015-07-31 NOTE — Therapy (Signed)
[facility] [address] [place], [place], [address] Phone: [phone]   Fax:  [phone]  Speech Language Pathology Treatment  Patient Details  Name: [name] [name] MRN: [id number] Date of Birth: [birth year] No Data Recorded  Encounter Date: 07/31/2015      End of Session - 07/31/15 1349    Visit Number 2   Number of Visits 17   Date for SLP Re-Evaluation 09/27/15   SLP Start Time 1021   SLP Stop Time  1101   SLP Time Calculation (min) 40 min   Activity Tolerance Patient tolerated treatment well      Past Medical History  Diagnosis Date  . Parkinson's disease ([place])   . BPH (benign prostatic hypertrophy)   . Peyronie disease   . History of kidney stones   . Left ureteral calculus   . S/P deep brain stimulator placement     11-29-2012  . Lower urinary tract symptoms (LUTS)   . GERD (gastroesophageal reflux disease)   . Wears glasses   . Wears hearing aid     BILATERAL-- INTERMITTANT WEARS  . Renal cyst, left   . Nephrolithiasis   . Fatigue     d/t Parkinson's  . Orthostatic hypotension     d/t Parkinson's disease  . Pneumonia   . Depression   . Constipation     Past Surgical History  Procedure Laterality Date  . Extracorporeal shock wave lithotripsy Left 10-11-2010  &  01-05-2011  . Cysto/  left ureterosocpy/   left ureteral stent placement/  laser bladder stones and extraction  09-25-2010  . Deep brain stimulator placement  11-29-2012    genertor device at left pectoral area--- not activated yet  . Sp perc nephrostomy Left 12-16-2012  . Cystoscopy with retrograde pyelogram, ureteroscopy and stent placement Left 01/06/2013    Procedure: CYSTOSCOPY WITH RETROGRADE PYELOGRAM, URETEROSCOPY AND STENT PLACEMENT;  Surgeon: Hanley Ben, MD;  Location: [facility];  Service: Urology;  Laterality: Left;  . Holmium laser application Left [id number]    Procedure: HOLMIUM LASER APPLICATION;   Surgeon: Hanley Ben, MD;  Location: [facility];  Service: Urology;  Laterality: Left;  . Stone extraction with basket Left 01/06/2013    Procedure: STONE EXTRACTION WITH BASKET;  Surgeon: Hanley Ben, MD;  Location: [facility];  Service: Urology;  Laterality: Left;  . Negative sleep study  2013  per pt  . Cystoscopy with retrograde pyelogram, ureteroscopy and stent placement Left 09/11/2014    Procedure: CYSTOSCOPY WITH LEFT RETROGRADE PYELOGRAM, URETEROSCOPY AND STENT PLACEMENT;  Surgeon: Lowella Bandy, MD;  Location: [facility];  Service: Urology;  Laterality: Left;  . Holmium laser application Left [id number]    Procedure: HOLMIUM LASER APPLICATION;  Surgeon: Lowella Bandy, MD;  Location: [facility];  Service: Urology;  Laterality: Left;  . Colonoscopy    . Cholecystectomy  12/21/2014    Procedure: LAPAROSCOPIC CHOLECYSTECTOMY;  Surgeon: Ralene Ok, MD;  Location: [facility];  Service: General;;    There were no vitals filed for this visit.      Subjective Assessment - 07/31/15 1030    Subjective Pt with basal cell removal Monday 07/29/15.   Patient is accompained by: --  wife               ADULT SLP TREATMENT - 07/31/15 1031    General Information   Behavior/Cognition Alert;Cooperative;Pleasant mood   Treatment Provided   Treatment provided Cognitive-Linquistic  Pain Assessment   Pain Assessment 0-10   Pain Score 3    Pain Location neck   Pain Descriptors / Indicators Tightness   Pain Intervention(s) Monitored during session   Cognitive-Linquistic Treatment   Treatment focused on Dysarthria   Skilled Treatment SLP facilitated louder conversational speech with loud /a/ - average 92dB with min A occasionally for loudness. Pt req'd cues for full breath initially. In structured tasks for multisentences, pt maintained 70dB 85% of the time. Pt noted with slower word finding ability. SLP suggested apps to assist with  word finding, including Constant [employer].    Assessment / Recommendations / Plan   Plan Continue with current plan of care   Progression Toward Goals   Progression toward goals Progressing toward goals          SLP Education - 07/31/15 1348    Education provided Yes   Education Details apps for anomia (constant therapy), full breath for loud /a/    Person(s) Educated Patient;Spouse   Methods Explanation;Demonstration;Verbal cues   Comprehension Verbalized understanding;Returned demonstration;Need further instruction          SLP Short Term Goals - 07/31/15 1350    SLP SHORT TERM GOAL #1   Title pt will sustain /a/ average 88dB over 3 sessions   Time 4   Period Weeks   Status On-going   SLP SHORT TERM GOAL #2   Title pt will  maintain 70dB loudness in 19/20 sentence responses   Time 4   Period Weeks   Status On-going   SLP SHORT TERM GOAL #3   Title pt will tell SLP 3 s/s aspiration PNA with modified independence   Time 4   Period Weeks   Status On-going          SLP Long Term Goals - 07/31/15 1351    SLP LONG TERM GOAL #1   Title pt will sustsin /a/ average 89dB over 4 sessions   Time 8   Period Weeks   Status On-going   SLP LONG TERM GOAL #2   Title pt will engage in 10 minutes mod complex conversation with average 70dB   Time 8   Period Weeks   Status On-going   SLP LONG TERM GOAL #3   Title pt will report improved vocal quality in 10 minutes mod complex conversation, compared to first session ST   Time 8   Period Weeks   Status On-going          Plan - 07/31/15 1349    Clinical Impression Statement Pt presents with mild mod dysarthria c/b imprecision of phonemes, rushes of speech, and mod hoarseness due to Parkinson's disease. 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 SLP provided some ideas for pt today for improvement with verbal expression. Skilled ST remains needed to improve pt's conversational loudness to WNL level as  well as address anomia.   Speech Therapy Frequency 2x / week   Duration --  8 weeks   Treatment/Interventions SLP instruction and feedback;Compensatory strategies;Internal/external aids;Patient/family education;Functional tasks;Cueing hierarchy   Potential to Achieve Goals Good      Patient will benefit from skilled therapeutic intervention in order to improve the following deficits and impairments:   Dysarthria and anarthria    Problem List Patient Active Problem List   Diagnosis Date Noted  . Acute pyelonephritis 12/16/2012  . Sepsis ([place]) 12/15/2012  . Parkinson's disease ([place]) 12/15/2012    Brigida Scotti ,[place], [place]  07/31/2015, 1:51 PM    [facility] [address] [place], [place], [address] Phone: [phone]   Fax:  [phone]   Name: Ilias Winsor MRN: [id number] Date of Birth: [birth year]

## 2015-08-02 ENCOUNTER — Ambulatory Visit: Payer: Medicare Other

## 2015-08-02 DIAGNOSIS — R2689 Other abnormalities of gait and mobility: Secondary | ICD-10-CM | POA: Diagnosis not present

## 2015-08-02 DIAGNOSIS — R471 Dysarthria and anarthria: Secondary | ICD-10-CM

## 2015-08-02 DIAGNOSIS — R278 Other lack of coordination: Secondary | ICD-10-CM | POA: Diagnosis not present

## 2015-08-02 DIAGNOSIS — R29898 Other symptoms and signs involving the musculoskeletal system: Secondary | ICD-10-CM | POA: Diagnosis not present

## 2015-08-02 DIAGNOSIS — R29818 Other symptoms and signs involving the nervous system: Secondary | ICD-10-CM | POA: Diagnosis not present

## 2015-08-02 DIAGNOSIS — R2681 Unsteadiness on feet: Secondary | ICD-10-CM | POA: Diagnosis not present

## 2015-08-02 NOTE — Therapy (Signed)
[facility] [address] [place], [place], [address] Phone: [phone]   Fax:  [phone]  Speech Language Pathology Treatment  Patient Details  Name: [name] [name] MRN: [id number] Date of Birth: 1945-06-28 No Data Recorded  Encounter Date: 08/02/2015      End of Session - 08/02/15 1018    Visit Number 3   Number of Visits 17   Date for SLP Re-Evaluation 09/27/15   SLP Start Time 0848   SLP Stop Time  0930   SLP Time Calculation (min) 42 min   Activity Tolerance Patient tolerated treatment well      Past Medical History  Diagnosis Date  . Parkinson's disease ([place])   . BPH (benign prostatic hypertrophy)   . Peyronie disease   . History of kidney stones   . Left ureteral calculus   . S/P deep brain stimulator placement     11-29-2012  . Lower urinary tract symptoms (LUTS)   . GERD (gastroesophageal reflux disease)   . Wears glasses   . Wears hearing aid     BILATERAL-- INTERMITTANT WEARS  . Renal cyst, left   . Nephrolithiasis   . Fatigue     d/t Parkinson's  . Orthostatic hypotension     d/t Parkinson's disease  . Pneumonia   . Depression   . Constipation     Past Surgical History  Procedure Laterality Date  . Extracorporeal shock wave lithotripsy Left 10-11-2010  &  01-05-2011  . Cysto/  left ureterosocpy/   left ureteral stent placement/  laser bladder stones and extraction  09-25-2010  . Deep brain stimulator placement  11-29-2012    genertor device at left pectoral area--- not activated yet  . Sp perc nephrostomy Left 12-16-2012  . Cystoscopy with retrograde pyelogram, ureteroscopy and stent placement Left 01/06/2013    Procedure: CYSTOSCOPY WITH RETROGRADE PYELOGRAM, URETEROSCOPY AND STENT PLACEMENT;  Surgeon: Hanley Ben, MD;  Location: [facility];  Service: Urology;  Laterality: Left;  . Holmium laser application Left [id number]    Procedure: HOLMIUM LASER APPLICATION;   Surgeon: Hanley Ben, MD;  Location: [facility];  Service: Urology;  Laterality: Left;  . Stone extraction with basket Left 01/06/2013    Procedure: STONE EXTRACTION WITH BASKET;  Surgeon: Hanley Ben, MD;  Location: [facility];  Service: Urology;  Laterality: Left;  . Negative sleep study  2013  per pt  . Cystoscopy with retrograde pyelogram, ureteroscopy and stent placement Left 09/11/2014    Procedure: CYSTOSCOPY WITH LEFT RETROGRADE PYELOGRAM, URETEROSCOPY AND STENT PLACEMENT;  Surgeon: Lowella Bandy, MD;  [facility];  Service: Urology;  Laterality: Left;  . Holmium laser application Left [id number]    Procedure: HOLMIUM LASER APPLICATION;  Surgeon: Lowella Bandy, MD;  Location: [facility];  Service: Urology;  Laterality: Left;  . Colonoscopy    . Cholecystectomy  12/21/2014    Procedure: LAPAROSCOPIC CHOLECYSTECTOMY;  Surgeon: Ralene Ok, MD;  Location: [facility];  Service: General;;    There were no vitals filed for this visit.             ADULT SLP TREATMENT - 08/02/15 0857    General Information   Behavior/Cognition Alert;Cooperative;Pleasant mood   Treatment Provided   Treatment provided Cognitive-Linquistic   Pain Assessment   Pain Assessment 0-10   Pain Score 3    Pain Location neck   Pain Descriptors / Indicators Tightness   Pain Intervention(s) Monitored during  session   Cognitive-Linquistic Treatment   Treatment focused on Dysarthria   Skilled Treatment SLP facilitated louder conversational speech with loud /a/ - average 92dB. In structured tasks for multisentences, pt maintained 70dB 90% of the time but this required min SLP cues for loudness rarely. Pt has "checked out" app for word finding. SLP educated pt with pitch glide exercise.   Assessment / Recommendations / Plan   Plan Continue with current plan of care   Progression Toward Goals   Progression toward goals Progressing toward goals           SLP Education - 08/02/15 1017    Education provided Yes   Education Details pitch glide exercise (added to HEP)   Person(s) Educated Patient   Methods Demonstration;Explanation;Verbal cues   Comprehension Verbalized understanding;Verbal cues required;Need further instruction          SLP Short Term Goals - 07/31/15 [place] #1   Title pt will sustain /a/ average 88dB over 3 sessions   Time 4   Period Weeks   Status On-going   SLP SHORT TERM GOAL #2   Title pt will  maintain 70dB loudness in 19/20 sentence responses   Time 4   Period Weeks   Status On-going   SLP SHORT TERM GOAL #3   Title pt will tell SLP 3 s/s aspiration PNA with modified independence   Time 4   Period Weeks   Status On-going          SLP Long Term Goals - 07/31/15 1351    SLP LONG TERM GOAL #1   Title pt will sustsin /a/ average 89dB over 4 sessions   Time 8   Period Weeks   Status On-going   SLP LONG TERM GOAL #2   Title pt will engage in 10 minutes mod complex conversation with average 70dB   Time 8   Period Weeks   Status On-going   SLP LONG TERM GOAL #3   Title pt will report improved vocal quality in 10 minutes mod complex conversation, compared to first session ST   Time 8   Period Weeks   Status On-going          Plan - 08/02/15 1018    Clinical Impression Statement Pt cont'd with mild mod dysarthria c/b imprecision of phonemes, rushes of speech, and mod hoarseness due to Parkinson's disease. 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 Pt c/o monotone speech so SLP added pitch glide exercise to pt's loud /a/. Skilled ST remains needed to improve pt's conversational loudness to WNL level as well as address anomia.   Speech Therapy Frequency 2x / week   Duration --  8 weeks   Treatment/Interventions SLP instruction and feedback;Compensatory strategies;Internal/external aids;Patient/family education;Functional tasks;Cueing hierarchy    Potential to Achieve Goals Good   Consulted and Agree with Plan of Care Patient      Patient will benefit from skilled therapeutic intervention in order to improve the following deficits and impairments:   Dysarthria and anarthria    Problem List Patient Active Problem List   Diagnosis Date Noted  . Acute pyelonephritis 12/16/2012  . Sepsis ([place]) 12/15/2012  . Parkinson's disease ([place]) 12/15/2012    [facility] ,[place], [place]   08/02/2015, 10:20 AM  [facility] [address] [place], [place], [address] Phone: [phone]   Fax:  [phone]   Name: [name] [name] MRN: [id number] Date of Birth: 11/03/1945

## 2015-08-02 NOTE — Patient Instructions (Signed)
  PITCH EXERCISES (do after the loud "ah"s, twice a day)  Start "OH" at a low pitch and glide up as HIGH as you can go, then go as low as you can go Repeat 7-10 times, twice a day *what you will find over time is that you can go lower, and higher, than you originally thought

## 2015-08-07 ENCOUNTER — Ambulatory Visit: Payer: Medicare Other | Attending: Neurology | Admitting: Physical Therapy

## 2015-08-07 DIAGNOSIS — R471 Dysarthria and anarthria: Secondary | ICD-10-CM | POA: Insufficient documentation

## 2015-08-07 DIAGNOSIS — R2681 Unsteadiness on feet: Secondary | ICD-10-CM | POA: Insufficient documentation

## 2015-08-07 DIAGNOSIS — R29898 Other symptoms and signs involving the musculoskeletal system: Secondary | ICD-10-CM | POA: Insufficient documentation

## 2015-08-07 DIAGNOSIS — R29818 Other symptoms and signs involving the nervous system: Secondary | ICD-10-CM | POA: Insufficient documentation

## 2015-08-07 DIAGNOSIS — R41844 Frontal lobe and executive function deficit: Secondary | ICD-10-CM | POA: Diagnosis not present

## 2015-08-07 DIAGNOSIS — R278 Other lack of coordination: Secondary | ICD-10-CM | POA: Diagnosis not present

## 2015-08-07 DIAGNOSIS — R2689 Other abnormalities of gait and mobility: Secondary | ICD-10-CM | POA: Insufficient documentation

## 2015-08-08 NOTE — Therapy (Signed)
[facility] [address] [place], [place], [address] Phone: [phone]   Fax:  [phone]  Physical Therapy Treatment  Patient Details  Name: [name] [name] MRN: [id number] Date of Birth: 10/03/1945 Referring Provider: Maxine Glenn  Encounter Date: 08/07/2015      PT End of Session - 08/08/15 1131    Visit Number 4   Number of Visits 17   Date for PT Re-Evaluation 09/15/15   Authorization Type Medicare/ BCBS-Gcode every 10th visit   PT Start Time 0935   PT Stop Time 1019   PT Time Calculation (min) 44 min   Equipment Utilized During Treatment Gait belt   Activity Tolerance Patient tolerated treatment well   Behavior During Therapy [facility] for tasks assessed/performed      Past Medical History  Diagnosis Date  . Parkinson's disease ([place])   . BPH (benign prostatic hypertrophy)   . Peyronie disease   . History of kidney stones   . Left ureteral calculus   . S/P deep brain stimulator placement     11-29-2012  . Lower urinary tract symptoms (LUTS)   . GERD (gastroesophageal reflux disease)   . Wears glasses   . Wears hearing aid     BILATERAL-- INTERMITTANT WEARS  . Renal cyst, left   . Nephrolithiasis   . Fatigue     d/t Parkinson's  . Orthostatic hypotension     d/t Parkinson's disease  . Pneumonia   . Depression   . Constipation     Past Surgical History  Procedure Laterality Date  . Extracorporeal shock wave lithotripsy Left 10-11-2010  &  01-05-2011  . Cysto/  left ureterosocpy/   left ureteral stent placement/  laser bladder stones and extraction  09-25-2010  . Deep brain stimulator placement  11-29-2012    genertor device at left pectoral area--- not activated yet  . Sp perc nephrostomy Left 12-16-2012  . Cystoscopy with retrograde pyelogram, ureteroscopy and stent placement Left 01/06/2013    Procedure: CYSTOSCOPY WITH RETROGRADE PYELOGRAM, URETEROSCOPY AND STENT PLACEMENT;  Surgeon: Hanley Ben, MD;   Location: [facility];  Service: Urology;  Laterality: Left;  . Holmium laser application Left [id number]    Procedure: HOLMIUM LASER APPLICATION;  Surgeon: Hanley Ben, MD;  Location: [facility];  Service: Urology;  Laterality: Left;  . Stone extraction with basket Left 01/06/2013    Procedure: STONE EXTRACTION WITH BASKET;  Surgeon: Hanley Ben, MD;  Location: [facility];  Service: Urology;  Laterality: Left;  . Negative sleep study  2013  per pt  . Cystoscopy with retrograde pyelogram, ureteroscopy and stent placement Left 09/11/2014    Procedure: CYSTOSCOPY WITH LEFT RETROGRADE PYELOGRAM, URETEROSCOPY AND STENT PLACEMENT;  Surgeon: Lowella Bandy, MD;  Location: [facility];  Service: Urology;  Laterality: Left;  . Holmium laser application Left [id number]    Procedure: HOLMIUM LASER APPLICATION;  Surgeon: Lowella Bandy, MD;  Location: [facility];  Service: Urology;  Laterality: Left;  . Colonoscopy    . Cholecystectomy  12/21/2014    Procedure: LAPAROSCOPIC CHOLECYSTECTOMY;  Surgeon: Ralene Ok, MD;  Location: [facility];  Service: General;;    There were no vitals filed for this visit.      Subjective Assessment - 08/07/15 0939    Subjective HIp is not hurting as much.  Have a doctor's appointment on Friday; pt reports he has called but not gotten an answer yet.   Patient is accompained by: Family  member  wife   Pertinent History Parkinson's disease since 2008; deep brain stimulation R side (L brain) 2014, L side (R brain)April 2017   Patient Stated Goals Pt's goal for therapy is to improve strength, feel more comfortable that I won't fall when I'm walking.   Currently in Pain? Yes   Pain Score 3    Pain Location Hip   Pain Orientation Left   Pain Descriptors / Indicators Aching   Pain Type Acute pain  6 weeks + after fall   Pain Onset More than a month ago   Pain Frequency Intermittent   Aggravating  Factors  putting wight on it aggravates   Pain Relieving Factors sitting                         [facility] Adult PT Treatment/Exercise - 08/07/15 1008    Ambulation/Gait   Ambulation/Gait Yes   Ambulation/Gait Assistance 5: Supervision   Ambulation/Gait Assistance Details Gait training with quick starts and stops, with cues for widening BOS and keeping feet in semi-tandem position for better balance upon quick stopping.  Gait training with turns, negotiating furniture, simulating home, with pt experiencing no LOB.     Ambulation Distance (Feet) 430 Feet   Assistive device None   Gait Pattern Step-through pattern;Decreased arm swing - left;Decreased step length - right;Decreased step length - left;Decreased trunk rotation   Ambulation Surface Level;Indoor   Gait Comments Pt appears to have increased tremor in UEs during gait activities today.           PWR [facility]) - 08/07/15 [id number]    PWR! exercises Moves in sitting;Moves in standing   PWR! Up x 20   PWR! Rock x 20   PWR! Twist x 20   PWR Step x 20 side step, back step, side step (second set of side step x 20) with cues for deliberate, large movement patterns   Comments PT provides cues for technique, for large, deliberate movement patterns with PWR! Moves in standing.   PWR! Up x 20   PWR! Rock x 20    PWR! Twist x 20 cues for hold for best posture position with brief stop prior to turning to clap hands   PWR! Step x 20 reps-cues for deliberate movement patterns with large, deliberate stepping.   Comments In sitting, pt requires occasional cues for technique and for large amplitude, deliberate movements.       PWR! Up for posture, PWR! Rock for [employer], [place]! Twist for trunk rotation, and PWR!Step for transition stepping.        PT Education - 08/08/15 1130    Education provided Yes   Education Details HEP-standing PWR! Moves   Person(s) Educated Patient;Spouse   Methods  Explanation;Demonstration;Handout;Verbal cues   Comprehension Verbalized understanding;Returned demonstration;Need further instruction          PT Short Term Goals - 07/17/15 1844    PT SHORT TERM GOAL #1   Title Pt will perform HEP for improved balance, transfers, gait and posture/decreased pain.  TARGET 08/15/15   Time 4   Period Weeks   Status New   PT SHORT TERM GOAL #2   Title Pt will improve 5x sit<>stand to less than or equal to 16 seconds for improved transfer efficiency and safety.   Time 4   Period Weeks   Status New   PT SHORT TERM GOAL #3   Title Pt will improve TUG score to less than or equal to  13.5 seconds for decreased fall risk.   Time 4   Period Weeks   Status New   PT SHORT TERM GOAL #4   Title Pt will improve Functional Gait Assessment to at least 18/30 for decreased fall risk.   Time 4   Period Weeks   Status New   PT SHORT TERM GOAL #5   Title Pt/family will verbalize understanding of local Parkinson's disease resources.   Time 4   Period Weeks   Status New           PT Long Term Goals - 07/17/15 1849    PT LONG TERM GOAL #1   Title Pt will verbalize/demonstrate understanding of tips to reduce freezing with gait and turns.  TARGET 09/16/15   Time 8   Period Weeks   Status New   PT LONG TERM GOAL #2   Title Pt will improve 5x sit<>stand to less than or equal to 14 secondsf or improved efficiency and safety with transfers.   Time 8   Period Weeks   Status New   PT LONG TERM GOAL #3   Title Pt will improve TUG manual to less than or equal to 15 seconds for improved dual tasking with gait.   Time 8   Period Weeks   Status New   PT LONG TERM GOAL #4   Title PT will improve Fucntional Gait assessment to at least 20/30 for decreased fall risk.   Time 8   Period Weeks   Status New   PT LONG TERM GOAL #5   Title Pt will improve gait velocity to at elast 2.62 ft/sec for imrpoved gait efficiency and safety.   Time 8   Period Weeks   Status New    Additional Long Term Goals   Additional Long Term Goals Yes   PT LONG TERM GOAL #6   Title Pt will verbalize plans for continued community fitness upon D/C from PT.   Time 8   Period Weeks   Status New               Plan - 08/08/15 1132    Clinical Impression Statement Treatment session focused on PWR! Moves in stting and standing (additions to HEP) for large amplitude, deliberate movement patterns, and gait activities.  Pt is having some trouble at home with perception of distance with turns and with fully clearing bed when getting up to go to bathroom, with pt responding well to cues for large, deliberate movements during gait activities.  Pt will continue to benefit from further skilled PT to address balance, gait and transfers,   Rehab Potential Good   PT Frequency 2x / week   PT Duration 8 weeks  plus eval   PT Treatment/Interventions ADLs/Self Care Home Management;Therapeutic exercise;Therapeutic activities;Functional mobility training;Stair training;Gait training;DME Instruction;Balance training;Neuromuscular re-education;Patient/family education   PT Next Visit Plan Review HEP for PWR! Moves standing; gait training with and without cane.  Simulate gait with turns as in bedroom at home; also check R foot position (per wife request)    Consulted and Agree with Plan of Care Patient;Family member/caregiver   Family Member Consulted wife      Patient will benefit from skilled therapeutic intervention in order to improve the following deficits and impairments:  Abnormal gait, Decreased balance, Decreased mobility, Decreased range of motion, Decreased safety awareness, Decreased strength, Difficulty walking, Postural dysfunction, Pain  Visit Diagnosis: Other abnormalities of gait and mobility     Problem List Patient Active Problem  List   Diagnosis Date Noted  . Acute pyelonephritis 12/16/2012  . Sepsis ([place]) 12/15/2012  . Parkinson's disease ([place]) 12/15/2012     [place] Deleo W. 08/08/2015, 11:36 AM Frazier Butt., PT [facility] [address] [place], [place], [address] Phone: [phone]   Fax:  [phone]  Name: [name] [name] MRN: [id number] Date of Birth: Aug 18, 1945

## 2015-08-08 NOTE — Patient Instructions (Signed)
Provided handout for standing PWR! Moves x 20 reps to be performed 1-2 times per day

## 2015-08-09 ENCOUNTER — Ambulatory Visit: Payer: Medicare Other

## 2015-08-09 ENCOUNTER — Ambulatory Visit: Payer: Medicare Other | Admitting: Physical Therapy

## 2015-08-09 DIAGNOSIS — R2681 Unsteadiness on feet: Secondary | ICD-10-CM | POA: Diagnosis not present

## 2015-08-09 DIAGNOSIS — R2689 Other abnormalities of gait and mobility: Secondary | ICD-10-CM

## 2015-08-09 DIAGNOSIS — R471 Dysarthria and anarthria: Secondary | ICD-10-CM

## 2015-08-09 DIAGNOSIS — R29818 Other symptoms and signs involving the nervous system: Secondary | ICD-10-CM | POA: Diagnosis not present

## 2015-08-09 DIAGNOSIS — R278 Other lack of coordination: Secondary | ICD-10-CM | POA: Diagnosis not present

## 2015-08-09 DIAGNOSIS — R29898 Other symptoms and signs involving the musculoskeletal system: Secondary | ICD-10-CM | POA: Diagnosis not present

## 2015-08-09 NOTE — Therapy (Signed)
[facility] [address] [place], [place], [address] Phone: [phone]   Fax:  [phone]  Physical Therapy Treatment  Patient Details  Name: [name] [name] MRN: [id number] Date of Birth: 06/23/1945 Referring Provider: Maxine Glenn  Encounter Date: 08/09/2015      PT End of Session - 08/09/15 0956    Visit Number 5   Number of Visits 17   Date for PT Re-Evaluation 09/15/15   Authorization Type Medicare/ BCBS-Gcode every 10th visit   PT Start Time 0804   PT Stop Time 0847   PT Time Calculation (min) 43 min   Activity Tolerance Patient tolerated treatment well   Behavior During Therapy [facility] for tasks assessed/performed      Past Medical History  Diagnosis Date  . Parkinson's disease ([place])   . BPH (benign prostatic hypertrophy)   . Peyronie disease   . History of kidney stones   . Left ureteral calculus   . S/P deep brain stimulator placement     11-29-2012  . Lower urinary tract symptoms (LUTS)   . GERD (gastroesophageal reflux disease)   . Wears glasses   . Wears hearing aid     BILATERAL-- INTERMITTANT WEARS  . Renal cyst, left   . Nephrolithiasis   . Fatigue     d/t Parkinson's  . Orthostatic hypotension     d/t Parkinson's disease  . Pneumonia   . Depression   . Constipation     Past Surgical History  Procedure Laterality Date  . Extracorporeal shock wave lithotripsy Left 10-11-2010  &  01-05-2011  . Cysto/  left ureterosocpy/   left ureteral stent placement/  laser bladder stones and extraction  09-25-2010  . Deep brain stimulator placement  11-29-2012    genertor device at left pectoral area--- not activated yet  . Sp perc nephrostomy Left 12-16-2012  . Cystoscopy with retrograde pyelogram, ureteroscopy and stent placement Left 01/06/2013    Procedure: CYSTOSCOPY WITH RETROGRADE PYELOGRAM, URETEROSCOPY AND STENT PLACEMENT;  Surgeon: Hanley Ben, MD;  Location: [facility];  Service:  Urology;  Laterality: Left;  . Holmium laser application Left [id number]    Procedure: HOLMIUM LASER APPLICATION;  Surgeon: Hanley Ben, MD;  Location: [facility];  Service: Urology;  Laterality: Left;  . Stone extraction with basket Left 01/06/2013    Procedure: STONE EXTRACTION WITH BASKET;  Surgeon: Hanley Ben, MD;  Location: [facility];  Service: Urology;  Laterality: Left;  . Negative sleep study  2013  per pt  . Cystoscopy with retrograde pyelogram, ureteroscopy and stent placement Left 09/11/2014    Procedure: CYSTOSCOPY WITH LEFT RETROGRADE PYELOGRAM, URETEROSCOPY AND STENT PLACEMENT;  Surgeon: Lowella Bandy, MD;  Location: [facility];  Service: Urology;  Laterality: Left;  . Holmium laser application Left [id number]    Procedure: HOLMIUM LASER APPLICATION;  Surgeon: Lowella Bandy, MD;  Location: [facility];  Service: Urology;  Laterality: Left;  . Colonoscopy    . Cholecystectomy  12/21/2014    Procedure: LAPAROSCOPIC CHOLECYSTECTOMY;  Surgeon: Ralene Ok, MD;  Location: [facility];  Service: General;;    There were no vitals filed for this visit.      Subjective Assessment - 08/09/15 0806    Subjective Have [facility] clinic appointment later this afternoon-my general practictioner.   Pertinent History Parkinson's disease since 2008; deep brain stimulation R side (L brain) 2014, L side (R brain)April 2017   Patient Stated Goals Pt's goal for therapy  is to improve strength, feel more comfortable that I won't fall when I'm walking.   Currently in Pain? Yes   Pain Score 4    Pain Location Neck   Pain Orientation Right;Left   Pain Type Chronic pain   Pain Onset More than a month ago   Aggravating Factors  turning head too far   Pain Relieving Factors unsure what alleviates-sleeping flatter does better                  Gait training:  Gait training without cane, multiple reps of 100 feet in gym area, with initial  cues for posture, arm swing, step length.  Short distance gait with turns with cues for wide-U-turns for improved negotiation of bed/bathroom activities.  Also practiced counting steps for improved turns.  Gait with quick start/stops and negotiating turns, narrow spaces around furniture with no LOB.         [facility] Adult PT Treatment/Exercise - 08/09/15 0001    Ambulation/Gait   Gait Comments Gait activities simulating short distances and manuevering turns around bed towards bathroom at home-progressively more challenging with additional reps, with added cognitive tasks and added carrying tasks.   Posture/Postural Control   Posture/Postural Control Postural limitations   Postural Limitations Rounded Shoulders;Forward head   Posture Comments Pt c/o pain with neck rotation; with cues for improved posture of neck through neck retraction, pt has decreased c/o pain with neck rotation.   Self-Care   Self-Care Other Self-Care Comments   Other Self-Care Comments  Per pt's question, discussed fatigue and tiredness with exercise.  Discussed overall activity level and need to build increased activity and exercise into routine consistently and gradually in order to improve endurance.  Encouraged patient to ask his MD at next visit about ongoing fatigue.  Also discussed strategies to improve negotiation of small spaces in bedroom to bathroom at home for decreased likelihood of falling.   Exercises   Exercises Neck   Neck Exercises: Seated   Neck Retraction 5 reps   Other Seated Exercise Seated and standing neck retraction position for best posture:  "being as tall as the wall"           [facility]) - 08/09/15 0815    PWR! exercises Moves in standing   PWR! Up x 20   PWR! Rock x 20   PWR! Twist x 20- cues for brief stop in middle for best posture and activation with trunk rotation   PWR Step x 20 side, alternating steps  Pt needs seated rest break after standing exercises.   Comments PT provides cues  for technique, deliberate, large amplitude movements.             PT Education - 08/09/15 0955    Education provided Yes   Education Details Fatigue in relation to PD, importance of consistent and frequent exercise, strategies for improved gait in narrow, small spaces to bathroom at home.   Person(s) Educated Patient   Methods Explanation;Demonstration;Handout   Comprehension Verbalized understanding          PT Short Term Goals - 07/17/15 1844    PT SHORT TERM GOAL #1   Title Pt will perform HEP for improved balance, transfers, gait and posture/decreased pain.  TARGET 08/15/15   Time 4   Period Weeks   Status New   PT SHORT TERM GOAL #2   Title Pt will improve 5x sit<>stand to less than or equal to 16 seconds for improved transfer efficiency and safety.  Time 4   Period Weeks   Status New   PT SHORT TERM GOAL #3   Title Pt will improve TUG score to less than or equal to 13.5 seconds for decreased fall risk.   Time 4   Period Weeks   Status New   PT SHORT TERM GOAL #4   Title Pt will improve Functional Gait Assessment to at least 18/30 for decreased fall risk.   Time 4   Period Weeks   Status New   PT SHORT TERM GOAL #5   Title Pt/family will verbalize understanding of local Parkinson's disease resources.   Time 4   Period Weeks   Status New           PT Long Term Goals - 07/17/15 1849    PT LONG TERM GOAL #1   Title Pt will verbalize/demonstrate understanding of tips to reduce freezing with gait and turns.  TARGET 09/16/15   Time 8   Period Weeks   Status New   PT LONG TERM GOAL #2   Title Pt will improve 5x sit<>stand to less than or equal to 14 secondsf or improved efficiency and safety with transfers.   Time 8   Period Weeks   Status New   PT LONG TERM GOAL #3   Title Pt will improve TUG manual to less than or equal to 15 seconds for improved dual tasking with gait.   Time 8   Period Weeks   Status New   PT LONG TERM GOAL #4   Title PT will  improve Fucntional Gait assessment to at least 20/30 for decreased fall risk.   Time 8   Period Weeks   Status New   PT LONG TERM GOAL #5   Title Pt will improve gait velocity to at elast 2.62 ft/sec for imrpoved gait efficiency and safety.   Time 8   Period Weeks   Status New   Additional Long Term Goals   Additional Long Term Goals Yes   PT LONG TERM GOAL #6   Title Pt will verbalize plans for continued community fitness upon D/C from PT.   Time 8   Period Weeks   Status New               Plan - 08/09/15 0957    Clinical Impression Statement Pt leaves session today reporting this was a very successful session.  Treatment session focused on standing large ampltiude movement patterns, with carry over to gait activities.  Pt tolerates progression of dual tasking gait activities well with minimal cues.  Pt will continue to benefit from further skilled PT to address balance, gait and transfer activities.   Rehab Potential Good   PT Frequency 2x / week   PT Duration 8 weeks  plus eval   PT Treatment/Interventions ADLs/Self Care Home Management;Therapeutic exercise;Therapeutic activities;Functional mobility training;Stair training;Gait training;DME Instruction;Balance training;Neuromuscular re-education;Patient/family education   PT Next Visit Plan Review gait with turns as in bedroom at home with progression of dual tasking; also check R foot position (per wife request); gait training with and without cane; discuss walking program.    Consulted and Agree with Plan of Care Patient      Patient will benefit from skilled therapeutic intervention in order to improve the following deficits and impairments:  Abnormal gait, Decreased balance, Decreased mobility, Decreased range of motion, Decreased safety awareness, Decreased strength, Difficulty walking, Postural dysfunction, Pain  Visit Diagnosis: Other abnormalities of gait and mobility  Problem List Patient Active Problem  List   Diagnosis Date Noted  . Acute pyelonephritis 12/16/2012  . Sepsis ([place]) 12/15/2012  . Parkinson's disease ([place]) 12/15/2012    Mahonri Seiden W. 08/09/2015, 10:00 AM  Frazier Butt., PT  [facility] [address] [place], [place], [address] Phone: [phone]   Fax:  [phone]  Name: [name] [name] MRN: [id number] Date of Birth: 07/24/1945

## 2015-08-09 NOTE — Patient Instructions (Addendum)
Axial Extension (Chin Tuck)    Pull chin in and lengthen back of neck. Hold __3__ seconds while counting out loud. Repeat __10__ times. Do __1-2__ sessions per day.  You can think about this posture/position when you are sitting and standing.  Think about being "as tall as the wall" http://gt2.exer.us/449   Copyright  VHI. All rights reserved.    TIPS TO MAKE TRIP TO BATHROOM AROUND BED EASIER AND SAFER: 1. COUNT YOUR STEPS OUT LOUD TO MAKE SURE YOU ARE CLEARING THE BED.  2.  USE AN EXTRA PILLOW AT THE EDGE OF THE BED CORNERS AS AN ADDED VISUAL FOR ROUNDING THE BED.  3.  MAKE A WIDE U-TURN WITH LARGE, BIG STEPS TO MAKE SURE YOU ARE CLEARING THE BED.

## 2015-08-09 NOTE — Patient Instructions (Signed)
  Please complete the assigned speech therapy homework and return it to your next session.  

## 2015-08-09 NOTE — Therapy (Signed)
[facility] [address] [place], [place], [address] Phone: [phone]   Fax:  [phone]  Speech Language Pathology Treatment  Patient Details  Name: [name] [name] MRN: [id number] Date of Birth: 11-03-1945 No Data Recorded  Encounter Date: 08/09/2015      End of Session - 08/09/15 1107    Visit Number 4   Number of Visits 17   Date for SLP Re-Evaluation 09/27/15   SLP Start Time 22   SLP Stop Time  1058   SLP Time Calculation (min) 39 min   Activity Tolerance Patient tolerated treatment well      Past Medical History  Diagnosis Date  . Parkinson's disease ([place])   . BPH (benign prostatic hypertrophy)   . Peyronie disease   . History of kidney stones   . Left ureteral calculus   . S/P deep brain stimulator placement     11-29-2012  . Lower urinary tract symptoms (LUTS)   . GERD (gastroesophageal reflux disease)   . Wears glasses   . Wears hearing aid     BILATERAL-- INTERMITTANT WEARS  . Renal cyst, left   . Nephrolithiasis   . Fatigue     d/t Parkinson's  . Orthostatic hypotension     d/t Parkinson's disease  . Pneumonia   . Depression   . Constipation     Past Surgical History  Procedure Laterality Date  . Extracorporeal shock wave lithotripsy Left 10-11-2010  &  01-05-2011  . Cysto/  left ureterosocpy/   left ureteral stent placement/  laser bladder stones and extraction  09-25-2010  . Deep brain stimulator placement  11-29-2012    genertor device at left pectoral area--- not activated yet  . Sp perc nephrostomy Left 12-16-2012  . Cystoscopy with retrograde pyelogram, ureteroscopy and stent placement Left 01/06/2013    Procedure: CYSTOSCOPY WITH RETROGRADE PYELOGRAM, URETEROSCOPY AND STENT PLACEMENT;  Surgeon: Hanley Ben, MD;  Location: [facility];  Service: Urology;  Laterality: Left;  . Holmium laser application Left [id number]    Procedure: HOLMIUM LASER APPLICATION;   Surgeon: Hanley Ben, MD;  Location: [facility];  Service: Urology;  Laterality: Left;  . Stone extraction with basket Left 01/06/2013    Procedure: STONE EXTRACTION WITH BASKET;  Surgeon: Hanley Ben, MD;  Location: [facility];  Service: Urology;  Laterality: Left;  . Negative sleep study  2013  per pt  . Cystoscopy with retrograde pyelogram, ureteroscopy and stent placement Left 09/11/2014    Procedure: CYSTOSCOPY WITH LEFT RETROGRADE PYELOGRAM, URETEROSCOPY AND STENT PLACEMENT;  Surgeon: Lowella Bandy, MD;  Location: [facility];  Service: Urology;  Laterality: Left;  . Holmium laser application Left [id number]    Procedure: HOLMIUM LASER APPLICATION;  Surgeon: Lowella Bandy, MD;  Location: [facility];  Service: Urology;  Laterality: Left;  . Colonoscopy    . Cholecystectomy  12/21/2014    Procedure: LAPAROSCOPIC CHOLECYSTECTOMY;  Surgeon: Ralene Ok, MD;  Location: [facility];  Service: General;;    There were no vitals filed for this visit.      Subjective Assessment - 08/09/15 1024    Subjective "I noticed on the way over here I said some things and my wife didn't ask me to repeat."               ADULT SLP TREATMENT - 08/09/15 1025    General Information   Behavior/Cognition Alert;Cooperative;Pleasant mood   Treatment Provided   Treatment  provided Cognitive-Linquistic   Pain Assessment   Pain Assessment 0-10   Pain Score 3    Pain Location neck   Pain Descriptors / Indicators Tightness   Pain Intervention(s) Monitored during session   Cognitive-Linquistic Treatment   Treatment focused on Dysarthria   Skilled Treatment SLP facilitated louder conversational speech with loud /a/ - average 93dB. In structured tasks for multisentences, pt maintained 70dB 85-90% of the time with rare min SLP cues for loudness. Initially pt's "in-between" comments were sub 70dB, however as SLP mentioned this to pt, he began being more  successful with increasing volume of comments between tasks.    Assessment / Recommendations / Plan   Plan Continue with current plan of care   Progression Toward Goals   Progression toward goals Progressing toward goals            SLP Short Term Goals - 08/09/15 1108    SLP SHORT TERM GOAL #1   Title pt will sustain /a/ average 88dB over 3 sessions   Time 3   Period Weeks   Status On-going   SLP SHORT TERM GOAL #2   Title pt will  maintain 70dB loudness in 19/20 sentence responses   Time 3   Period Weeks   Status On-going   SLP SHORT TERM GOAL #3   Title pt will tell SLP 3 s/s aspiration PNA with modified independence   Time 3   Period Weeks   Status On-going          SLP Long Term Goals - 08/09/15 1108    SLP LONG TERM GOAL #1   Title pt will sustsin /a/ average 89dB over 4 sessions   Time 7   Period Weeks   Status On-going   SLP LONG TERM GOAL #2   Title pt will engage in 10 minutes mod complex conversation with average 70dB   Time 7   Period Weeks   Status On-going   SLP LONG TERM GOAL #3   Title pt will report improved vocal quality in 10 minutes mod complex conversation, compared to first session ST   Time 7   Period Weeks   Status On-going          Plan - 08/09/15 1107    Clinical Impression Statement Reports spoke in car and didn't need to repeat at same frequency as last week. 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 Pt c/o monotone speech so SLP added pitch glide exercise to pt's loud /a/. Skilled ST remains needed to improve pt's conversational loudness to WNL level as well as address anomia.   Speech Therapy Frequency 2x / week   Duration --  7 weeks   Treatment/Interventions SLP instruction and feedback;Compensatory strategies;Internal/external aids;Patient/family education;Functional  tasks;Cueing hierarchy   Potential to Achieve Goals Good      Patient will benefit from skilled therapeutic intervention in order to improve the following deficits and impairments:   Dysarthria and anarthria    Problem List Patient Active Problem List   Diagnosis Date Noted  . Acute pyelonephritis 12/16/2012  . Sepsis ([place]) 12/15/2012  . Parkinson's disease ([place]) 12/15/2012    [facility] ,[place], [place]   08/09/2015, 11:09 AM  [facility] [address] [place], [place], [address] Phone: [phone]   Fax:  [phone]   Name: [name] [name] MRN: [id number] Date  of Birth: [birth year]

## 2015-08-14 ENCOUNTER — Ambulatory Visit: Payer: Medicare Other | Admitting: Physical Therapy

## 2015-08-14 ENCOUNTER — Ambulatory Visit: Payer: Medicare Other | Admitting: Occupational Therapy

## 2015-08-14 ENCOUNTER — Ambulatory Visit: Payer: Medicare Other

## 2015-08-14 DIAGNOSIS — R2689 Other abnormalities of gait and mobility: Secondary | ICD-10-CM | POA: Diagnosis not present

## 2015-08-14 DIAGNOSIS — R29898 Other symptoms and signs involving the musculoskeletal system: Secondary | ICD-10-CM

## 2015-08-14 DIAGNOSIS — R471 Dysarthria and anarthria: Secondary | ICD-10-CM | POA: Diagnosis not present

## 2015-08-14 DIAGNOSIS — R41844 Frontal lobe and executive function deficit: Secondary | ICD-10-CM

## 2015-08-14 DIAGNOSIS — R2681 Unsteadiness on feet: Secondary | ICD-10-CM | POA: Diagnosis not present

## 2015-08-14 DIAGNOSIS — R278 Other lack of coordination: Secondary | ICD-10-CM | POA: Diagnosis not present

## 2015-08-14 DIAGNOSIS — R29818 Other symptoms and signs involving the nervous system: Secondary | ICD-10-CM | POA: Diagnosis not present

## 2015-08-14 NOTE — Therapy (Signed)
[facility] [address] [place], [place], [address] Phone: [phone]   Fax:  [phone]  Occupational Therapy Treatment  Patient Details  Name: [name] [name] MRN: [id number] Date of Birth: January 09, 1946 Referring Provider: Dr. Maxine Glenn  Encounter Date: 08/14/2015      OT End of Session - 08/14/15 0821    Visit Number 2   Number of Visits 17   Date for OT Re-Evaluation 09/14/15   Authorization Type Medicare   Authorization Time Period G code   Authorization - Visit Number 2   Authorization - Number of Visits 10   OT Start Time 0805   OT Stop Time 0845   OT Time Calculation (min) 40 min   Activity Tolerance Patient tolerated treatment well   Behavior During Therapy [facility] for tasks assessed/performed      Past Medical History  Diagnosis Date  . Parkinson's disease ([place])   . BPH (benign prostatic hypertrophy)   . Peyronie disease   . History of kidney stones   . Left ureteral calculus   . S/P deep brain stimulator placement     11-29-2012  . Lower urinary tract symptoms (LUTS)   . GERD (gastroesophageal reflux disease)   . Wears glasses   . Wears hearing aid     BILATERAL-- INTERMITTANT WEARS  . Renal cyst, left   . Nephrolithiasis   . Fatigue     d/t Parkinson's  . Orthostatic hypotension     d/t Parkinson's disease  . Pneumonia   . Depression   . Constipation     Past Surgical History  Procedure Laterality Date  . Extracorporeal shock wave lithotripsy Left 10-11-2010  &  01-05-2011  . Cysto/  left ureterosocpy/   left ureteral stent placement/  laser bladder stones and extraction  09-25-2010  . Deep brain stimulator placement  11-29-2012    genertor device at left pectoral area--- not activated yet  . Sp perc nephrostomy Left 12-16-2012  . Cystoscopy with retrograde pyelogram, ureteroscopy and stent placement Left 01/06/2013    Procedure: CYSTOSCOPY WITH RETROGRADE PYELOGRAM, URETEROSCOPY AND STENT  PLACEMENT;  Surgeon: Hanley Ben, MD;  Location: [facility];  Service: Urology;  Laterality: Left;  . Holmium laser application Left [id number]    Procedure: HOLMIUM LASER APPLICATION;  Surgeon: Hanley Ben, MD;  Location: [facility];  Service: Urology;  Laterality: Left;  . Stone extraction with basket Left 01/06/2013    Procedure: STONE EXTRACTION WITH BASKET;  Surgeon: Hanley Ben, MD;  Location: [facility];  Service: Urology;  Laterality: Left;  . Negative sleep study  2013  per pt  . Cystoscopy with retrograde pyelogram, ureteroscopy and stent placement Left 09/11/2014    Procedure: CYSTOSCOPY WITH LEFT RETROGRADE PYELOGRAM, URETEROSCOPY AND STENT PLACEMENT;  Surgeon: Lowella Bandy, MD;  Location: [facility];  Service: Urology;  Laterality: Left;  . Holmium laser application Left [id number]    Procedure: HOLMIUM LASER APPLICATION;  Surgeon: Lowella Bandy, MD;  Location: [facility];  Service: Urology;  Laterality: Left;  . Colonoscopy    . Cholecystectomy  12/21/2014    Procedure: LAPAROSCOPIC CHOLECYSTECTOMY;  Surgeon: Ralene Ok, MD;  Location: [facility];  Service: General;;    There were no vitals filed for this visit.      Subjective Assessment - 08/14/15 0810    Subjective  Pt denies pain, just general stiffness   Pertinent History see EPic, bilateral DBS   Patient Stated Goals  improve speed with ADLs, improve balance   Currently in Pain? No/denies        flipping playing cards and dealing playing cards with large amplitude movements, then Rotating a ball in each hand with large amplitude movements, min- mod  v.c./ demonstration Stacking coins with each hand using large amplitude movements, min v.c., increased difficulty with LUE PWR! Hands x 10 reps, min v.c/ demonstration                 PWR [facility] - 08/14/15 0818    PWR! Up x20   PWR! Rock [employer]x20   PWR! Twist x20   PWR! Step x10    Comments min-mod v.c./ demonstration fro large amplitude movements             OT Education - 08/14/15 0950    Education provided Yes   Education Details PWR! supine basic 4 and coordination HEP   Person(s) Educated Patient   Methods Explanation;Demonstration;Verbal cues;Handout   Comprehension Returned demonstration          OT Short Term Goals - 07/17/15 0928    OT SHORT TERM GOAL #1   Title I with PD specific HEP   Time 4   Period Weeks   Status New   OT SHORT TERM GOAL #2   Title Pt will verbalize understanding of adapted strategies for ADLs/IADLs, including ways to minimize freezing episodes.   Time 4   Period Weeks   Status New   OT SHORT TERM GOAL #3   Title Pt will demonstrate improved ease with feeding as evidenced by decreasing PPT#2 to 11 secs or less.   Time 4   Period Weeks   Status New   OT SHORT TERM GOAL #4   Title Pt will demonstrate ability to write a paragraph with 100% legibility, and no significant decrease in letter size.   Time 4   Period Weeks   Status New           OT Long Term Goals - 07/17/15 0930    OT LONG TERM GOAL #1   Title Pt will verbalize understanding of ways to prevent future PD related complications and verbalize understanding of community resources.   Time 8   Period Weeks   Status New   OT LONG TERM GOAL #2   Title Pt will demonstrate increased ease with dressing as evidenced by decreasing 3 button/ unbutton to 35 secs or less   Baseline 39.06 secs.   Time 8   Period Weeks   Status New   OT LONG TERM GOAL #3   Title Pt will verbalize understanding of compenstaory strategies for cognitive deficits, and ways to keep thinking skills sharp.   Time 8   Period Weeks   Status New   OT LONG TERM GOAL #4   Title Pt will demonstrate improved bilateral UE functional use as evidenced by increasing bilateral box/ blocks by 4 blocks.   Baseline RUE 38 blocks, LUE 42 blocks   Time 8   Period Weeks   Status New                Plan - 08/14/15 [id number]    Clinical Impression Statement Pt is progressing towards goals for PD specific HEP.   Rehab Potential Good   OT Frequency 2x / week   OT Duration 8 weeks   OT Treatment/Interventions Self-care/ADL training;Therapeutic exercise;Patient/family education;Balance training;Splinting;Manual Therapy;Neuromuscular education;Ultrasound;Therapeutic exercises;Therapeutic activities;DME and/or AE instruction;Parrafin;Cryotherapy;Cognitive remediation/compensation;Visual/perceptual remediation/compensation;Passive range of motion;Contrast Bath;Moist Heat;Fluidtherapy;Energy conservation;Functional Mobility  Training   Plan reinforce PWR! supine PRN, issue coordination HEP   Consulted and Agree with Plan of Care Patient;Family member/caregiver   Family Member Consulted wife, son      Patient will benefit from skilled therapeutic intervention in order to improve the following deficits and impairments:  Abnormal gait, Decreased coordination, Decreased range of motion, Difficulty walking, Impaired flexibility, Impaired sensation, Increased edema, Decreased safety awareness, Decreased endurance, Decreased activity tolerance, Decreased knowledge of precautions, Impaired tone, Pain, Impaired UE functional use, Decreased knowledge of use of DME, Decreased balance, Decreased cognition, Decreased mobility, Decreased strength  Visit Diagnosis: Other symptoms and signs involving the musculoskeletal system  Other symptoms and signs involving the nervous system  Frontal lobe and executive function deficit  Other lack of coordination    Problem List Patient Active Problem List   Diagnosis Date Noted  . Acute pyelonephritis 12/16/2012  . Sepsis ([place]) 12/15/2012  . Parkinson's disease ([place]) 12/15/2012    RINE,KATHRYN 08/14/2015, 9:51 AM  [facility] [address] [place], [place], [address] Phone:  [phone]   Fax:  [phone]  Name: Zae Stones MRN: [id number] Date of Birth: 1945/05/17

## 2015-08-14 NOTE — Patient Instructions (Signed)
Coordination Exercises  Perform the following exercises for 20 minutes 1 times per day. Perform with both hand(s). Perform using big movements.   Flipping Cards: Place deck of cards on the table. Flip cards over by opening your hand big to grasp and then turn your palm up big.  Deal cards: Hold 1/2 or whole deck in your hand. Use thumb to push card off top of deck with one big push.  Rotate ball with fingertips: Pick up with fingers/thumb and move as much as you can with each turn/movement (clockwise and counter-clockwise).  Toss ball from one hand to the other: Toss big/high.  Toss ball in the air and catch with the same hand: Toss big/high.  Pick up coins and place in coin bank or container: Pick up with big, intentional movements. Do not drag coin to the edge.  Pick up coins and stack one at a time: Pick up with big, intentional movements. Do not drag coin to the edge. (5-10 in a stack)  Pick up 5-10 coins one at a time and hold in palm. Then, move coins from palm to fingertips one at time and place in coin bank/container.  Pick up 5-10 coins one at a time and hold in palm. Then, move coins from palm to fingertips one at a time to stack.  Practice writing: Slow down, write big, and focus on forming each letter.  Perform "Flicks"/hand stretches (PWR! Hands): Close hands then flick out your fingers with focus on opening hands, pulling wrists back, and extending elbows like you are pushing. 

## 2015-08-14 NOTE — Patient Instructions (Signed)
WALKING  Walking is a great form of exercise to increase your strength, endurance and overall fitness.  A walking program can help you start slowly and gradually build endurance as you go.  Everyone's ability is different, so each person's starting point will be different.  You do not have to follow them exactly.  The are just samples. You should simply find out what's right for you and stick to that program.   In the beginning, you'll start off walking 2-3 times a day for short distances.  As you get stronger, you'll be walking further at just 1-2 times per day.  A. You Can Walk For A Certain Length Of Time Each Day    Walk 3-4 minutes 3 times per day.  Increase 1-2 minutes every 2 days (3 times per day).  Work up to 12-15 minutes (1-2 times per day).   Example:   Day 1-2 3-4 minutes 3 times per day   Day 7-8 8-10 minutes 2-3 times per day   Day 13-14 112-15 minutes 1-2 times per day  B. You Can Walk For a Certain Distance Each Day     Distance can be substituted for time.    Example:   3 trips to mailbox (at road)   3 trips to corner of block   3 trips around the block    Please only do the exercises that your therapist has initialed and dated

## 2015-08-15 NOTE — Therapy (Signed)
[facility] [address] [place], [place], [address] Phone: [phone]   Fax:  [phone]  Physical Therapy Treatment  Patient Details  Name: Joshva Labreck MRN: [id number] Date of Birth: January 02, 1946 Referring Provider: Maxine Glenn  Encounter Date: 08/14/2015      PT End of Session - 08/15/15 0835    Visit Number 6   Number of Visits 17   Date for PT Re-Evaluation 09/15/15   Authorization Type Medicare/ BCBS-Gcode every 10th visit   PT Start Time 0849   PT Stop Time 0930   PT Time Calculation (min) 41 min   Activity Tolerance Patient tolerated treatment well   Behavior During Therapy [facility] for tasks assessed/performed      Past Medical History  Diagnosis Date  . Parkinson's disease ([place])   . BPH (benign prostatic hypertrophy)   . Peyronie disease   . History of kidney stones   . Left ureteral calculus   . S/P deep brain stimulator placement     11-29-2012  . Lower urinary tract symptoms (LUTS)   . GERD (gastroesophageal reflux disease)   . Wears glasses   . Wears hearing aid     BILATERAL-- INTERMITTANT WEARS  . Renal cyst, left   . Nephrolithiasis   . Fatigue     d/t Parkinson's  . Orthostatic hypotension     d/t Parkinson's disease  . Pneumonia   . Depression   . Constipation     Past Surgical History  Procedure Laterality Date  . Extracorporeal shock wave lithotripsy Left 10-11-2010  &  01-05-2011  . Cysto/  left ureterosocpy/   left ureteral stent placement/  laser bladder stones and extraction  09-25-2010  . Deep brain stimulator placement  11-29-2012    genertor device at left pectoral area--- not activated yet  . Sp perc nephrostomy Left 12-16-2012  . Cystoscopy with retrograde pyelogram, ureteroscopy and stent placement Left 01/06/2013    Procedure: CYSTOSCOPY WITH RETROGRADE PYELOGRAM, URETEROSCOPY AND STENT PLACEMENT;  Surgeon: Hanley Ben, MD;  Location: [facility];   Service: Urology;  Laterality: Left;  . Holmium laser application Left 25/10/5636    Procedure: HOLMIUM LASER APPLICATION;  Surgeon: Hanley Ben, MD;  Location: [facility];  Service: Urology;  Laterality: Left;  . Stone extraction with basket Left 01/06/2013    Procedure: STONE EXTRACTION WITH BASKET;  Surgeon: Hanley Ben, MD;  Location: [facility];  Service: Urology;  Laterality: Left;  . Negative sleep study  2013  per pt  . Cystoscopy with retrograde pyelogram, ureteroscopy and stent placement Left 09/11/2014    Procedure: CYSTOSCOPY WITH LEFT RETROGRADE PYELOGRAM, URETEROSCOPY AND STENT PLACEMENT;  Surgeon: Lowella Bandy, MD;  Location: [facility];  Service: Urology;  Laterality: Left;  . Holmium laser application Left 08/07/6431    Procedure: HOLMIUM LASER APPLICATION;  Surgeon: Lowella Bandy, MD;  Location: [facility];  Service: Urology;  Laterality: Left;  . Colonoscopy    . Cholecystectomy  12/21/2014    Procedure: LAPAROSCOPIC CHOLECYSTECTOMY;  Surgeon: Ralene Ok, MD;  Location: [facility];  Service: General;;    There were no vitals filed for this visit.      Subjective Assessment - 08/15/15 0827    Subjective Had x-ray for hip at the [facility], but haven't gotten results back yet.  Feel just stiffer in general this morning.  Wife reports noticing improved negotiation around bed since last PT visit.   Pertinent History Parkinson's disease  since 2008; deep brain stimulation R side (L brain) 2014, L side (R brain)April 2017   Patient Stated Goals Pt's goal for therapy is to improve strength, feel more comfortable that I won't fall when I'm walking.   Currently in Pain? No/denies   Pain Onset More than a month ago            [facility] PT Assessment - 08/15/15 0829    Transfers   Transfers Sit to Stand   Sit to Stand 6: Modified independent (Device/Increase time);Without upper extremity assist;From chair/3-in-1   Five time sit  to stand comments  16.60  posterior lean last 2 reps   Ambulation/Gait   Ambulation/Gait Yes   Functional Gait  Assessment   Gait assessed  Yes   Gait Level Surface Walks 20 ft in less than 7 sec but greater than 5.5 sec, uses assistive device, slower speed, mild gait deviations, or deviates 6-10 in outside of the 12 in walkway width.   Change in Gait Speed Able to change speed, demonstrates mild gait deviations, deviates 6-10 in outside of the 12 in walkway width, or no gait deviations, unable to achieve a major change in velocity, or uses a change in velocity, or uses an assistive device.   Gait with Horizontal Head Turns Performs head turns smoothly with slight change in gait velocity (eg, minor disruption to smooth gait path), deviates 6-10 in outside 12 in walkway width, or uses an assistive device.   Gait with Vertical Head Turns Performs task with slight change in gait velocity (eg, minor disruption to smooth gait path), deviates 6 - 10 in outside 12 in walkway width or uses assistive device   Gait and Pivot Turn Pivot turns safely within 3 sec and stops quickly with no loss of balance.   Step Over Obstacle Is able to step over one shoe box (4.5 in total height) but must slow down and adjust steps to clear box safely. May require verbal cueing.   Gait with Narrow Base of Support Ambulates 7-9 steps.   Gait with Eyes Closed Walks 20 ft, slow speed, abnormal gait pattern, evidence for imbalance, deviates 10-15 in outside 12 in walkway width. Requires more than 9 sec to ambulate 20 ft.  10.24 sec   Ambulating Backwards Walks 20 ft, slow speed, abnormal gait pattern, evidence for imbalance, deviates 10-15 in outside 12 in walkway width.  18.89 sec   Steps Alternating feet, must use rail.   Total Score 18   FGA comment: Improved from 14/30; scores <22/30 indicate increased fall risk.     With second set of transfers, PT provides cues for increased forward lean and increased momentum for  improved efficiency with transfers.                [place] Adult PT Treatment/Exercise - 08/15/15 0829    Transfers   Stand to Sit 6: Modified independent (Device/Increase time)   Number of Reps Other reps (comment)  2 sets of 5 reps   Ambulation/Gait   Ambulation/Gait Assistance 5: Supervision   Ambulation/Gait Assistance Details Gait training using bilateral walking poles for facilitation of bilateral arm swing, 230 ft x 2 reps.  Pt requires cues for increased step length and increased heelstrike on RLE.  Pt noted to have dystonia-type movement in R foot, with increased plantar flexion, inversion and supination during swing phase of gait.   Ambulation Distance (Feet) 230 Feet  x 2, cues for relaxed arm swing and step length  Assistive device None  bilateral walking poles   Gait Pattern Step-through pattern;Decreased arm swing - left;Decreased step length - right;Decreased step length - left;Decreased trunk rotation  internal rotation, supination, inversion on RLE   Ambulation Surface Level;Indoor   Standardized Balance Assessment   Standardized Balance Assessment Timed Up and Go Test   Timed Up and Go Test   TUG Normal TUG   Normal TUG (seconds) 16.04  at best      Other trials of TUG:  18.46 sec, 16.16 sec            PT Education - 08/15/15 0835    Education provided Yes   Education Details Walking program   Person(s) Educated Patient   Methods Explanation;Handout   Comprehension Verbalized understanding          PT Short Term Goals - 08/14/15 0854    PT SHORT TERM GOAL #1   Title Pt will perform HEP for improved balance, transfers, gait and posture/decreased pain.  TARGET 08/15/15   Time 4   Period Weeks   Status On-going   PT SHORT TERM GOAL #2   Title Pt will improve 5x sit<>stand to less than or equal to 16 seconds for improved transfer efficiency and safety.   Time 4   Period Weeks   Status On-going   PT SHORT TERM GOAL #3   Title Pt will  improve TUG score to less than or equal to 13.5 seconds for decreased fall risk.   Time 4   Period Weeks   Status On-going   PT SHORT TERM GOAL #4   Title Pt will improve Functional Gait Assessment to at least 18/30 for decreased fall risk.   Time 4   Period Weeks   Status Achieved   PT SHORT TERM GOAL #5   Title Pt/family will verbalize understanding of local Parkinson's disease resources.   Time 4   Period Weeks   Status New           PT Long Term Goals - 07/17/15 1849    PT LONG TERM GOAL #1   Title Pt will verbalize/demonstrate understanding of tips to reduce freezing with gait and turns.  TARGET 09/16/15   Time 8   Period Weeks   Status New   PT LONG TERM GOAL #2   Title Pt will improve 5x sit<>stand to less than or equal to 14 secondsf or improved efficiency and safety with transfers.   Time 8   Period Weeks   Status New   PT LONG TERM GOAL #3   Title Pt will improve TUG manual to less than or equal to 15 seconds for improved dual tasking with gait.   Time 8   Period Weeks   Status New   PT LONG TERM GOAL #4   Title PT will improve Fucntional Gait assessment to at least 20/30 for decreased fall risk.   Time 8   Period Weeks   Status New   PT LONG TERM GOAL #5   Title Pt will improve gait velocity to at elast 2.62 ft/sec for imrpoved gait efficiency and safety.   Time 8   Period Weeks   Status New   Additional Long Term Goals   Additional Long Term Goals Yes   PT LONG TERM GOAL #6   Title Pt will verbalize plans for continued community fitness upon D/C from PT.   Time 8   Period Weeks   Status New  Plan - 08/15/15 0836    Clinical Impression Statement Pt has met goal for improved Functional Gait Assessment score.  Other 5x sit<>stand and TUG goals not yet met; may be assessed again next visit.  Pt is noticing improved functional mobility with specific gait tasks practiced in here therapy wtih good carryover for home.  However, pt is  concerned about increased tremor noticed in RUE and RLE as well as possible dystonia in RLE.  Pt will continue to benefit from further skilled PT to address balance, gait and transfer activities.   Rehab Potential Good   PT Frequency 2x / week   PT Duration 8 weeks  plus eval   PT Treatment/Interventions ADLs/Self Care Home Management;Therapeutic exercise;Therapeutic activities;Functional mobility training;Stair training;Gait training;DME Instruction;Balance training;Neuromuscular re-education;Patient/family education   PT Next Visit Plan Review gait with turns as in bedroom at home with progression of dual tasking; also check R foot position (per wife request); check remaining STGs.   Consulted and Agree with Plan of Care Patient      Patient will benefit from skilled therapeutic intervention in order to improve the following deficits and impairments:  Abnormal gait, Decreased balance, Decreased mobility, Decreased range of motion, Decreased safety awareness, Decreased strength, Difficulty walking, Postural dysfunction, Pain  Visit Diagnosis: Other abnormalities of gait and mobility  Other symptoms and signs involving the nervous system     Problem List Patient Active Problem List   Diagnosis Date Noted  . Acute pyelonephritis 12/16/2012  . Sepsis ([place]) 12/15/2012  . Parkinson's disease ([place]) 12/15/2012    MARRIOTT,AMY W. 08/15/2015, 9:06 AM  Frazier Butt., PT  [facility] [address] [place], [place], [address] Phone: [phone]   Fax:  [phone]  Name: Sabre Leonetti MRN: [id number] Date of Birth: 28-Aug-1945

## 2015-08-15 NOTE — Therapy (Signed)
[facility] [address] [place], [place], [address] Phone: [phone]   Fax:  [phone]  Speech Language Pathology Treatment  Patient Details  Name: [name] [name] MRN: [id number] Date of Birth: 11/11/1945 No Data Recorded  Encounter Date: 08/14/2015      End of Session - 08/14/15 0939    Visit Number 5   Number of Visits 17   Date for SLP Re-Evaluation 09/27/15   SLP Start Time 1018   SLP Stop Time  1100   SLP Time Calculation (min) 42 min   Activity Tolerance Patient tolerated treatment well      Past Medical History  Diagnosis Date  . Parkinson's disease ([place])   . BPH (benign prostatic hypertrophy)   . Peyronie disease   . History of kidney stones   . Left ureteral calculus   . S/P deep brain stimulator placement     11-29-2012  . Lower urinary tract symptoms (LUTS)   . GERD (gastroesophageal reflux disease)   . Wears glasses   . Wears hearing aid     BILATERAL-- INTERMITTANT WEARS  . Renal cyst, left   . Nephrolithiasis   . Fatigue     d/t Parkinson's  . Orthostatic hypotension     d/t Parkinson's disease  . Pneumonia   . Depression   . Constipation     Past Surgical History  Procedure Laterality Date  . Extracorporeal shock wave lithotripsy Left 10-11-2010  &  01-05-2011  . Cysto/  left ureterosocpy/   left ureteral stent placement/  laser bladder stones and extraction  09-25-2010  . Deep brain stimulator placement  11-29-2012    genertor device at left pectoral area--- not activated yet  . Sp perc nephrostomy Left 12-16-2012  . Cystoscopy with retrograde pyelogram, ureteroscopy and stent placement Left 01/06/2013    Procedure: CYSTOSCOPY WITH RETROGRADE PYELOGRAM, URETEROSCOPY AND STENT PLACEMENT;  Surgeon: Hanley Ben, MD;  Location: [facility];  Service: Urology;  Laterality: Left;  . Holmium laser application Left [id number]    Procedure: HOLMIUM LASER APPLICATION;   Surgeon: Hanley Ben, MD;  Location: [facility];  Service: Urology;  Laterality: Left;  . Stone extraction with basket Left 01/06/2013    Procedure: STONE EXTRACTION WITH BASKET;  Surgeon: Hanley Ben, MD;  Location: [facility];  Service: Urology;  Laterality: Left;  . Negative sleep study  2013  per pt  . Cystoscopy with retrograde pyelogram, ureteroscopy and stent placement Left 09/11/2014    Procedure: CYSTOSCOPY WITH LEFT RETROGRADE PYELOGRAM, URETEROSCOPY AND STENT PLACEMENT;  Surgeon: Lowella Bandy, MD;  Location: [facility];  Service: Urology;  Laterality: Left;  . Holmium laser application Left [id number]    Procedure: HOLMIUM LASER APPLICATION;  Surgeon: Lowella Bandy, MD;  Location: [facility];  Service: Urology;  Laterality: Left;  . Colonoscopy    . Cholecystectomy  12/21/2014    Procedure: LAPAROSCOPIC CHOLECYSTECTOMY;  Surgeon: Ralene Ok, MD;  Location: [facility];  Service: General;;    There were no vitals filed for this visit.      Subjective Assessment - 08/14/15 1034    Subjective "I woke up from a vivid dream on teh floor at 6 AM, and haven't really been myself today."               ADULT SLP TREATMENT - 08/15/15 0001    General Information   Behavior/Cognition Alert;Cooperative;Pleasant mood   Treatment Provided   Treatment  provided Cognitive-Linquistic   Pain Assessment   Pain Assessment 0-10   Pain Score 2    Pain Location neck   Pain Descriptors / Indicators Tightness   Pain Intervention(s) Monitored during session   Cognitive-Linquistic Treatment   Treatment focused on Dysarthria   Skilled Treatment As pt entered ST room, average 66dB. Loud /a/, took pt 3 reps to reach >89dB. Average after the first 3 reps 90dB average. SLP used short phrases to facilitate louder speech in simpler linguistic context to ladder to higher contexts - success here was 95%. With sentence level responses pt  remained at or above 70dB 90% of the time. Short discussion with pt re: "hard" days and need to still feel incr'd effort with speech for incr'd intelligibility.   Assessment / Recommendations / Plan   Plan Continue with current plan of care   Progression Toward Goals   Progression toward goals Progressing toward goals            SLP Short Term Goals - 08/14/15 1735    SLP SHORT TERM GOAL #1   Title pt will sustain /a/ average 88dB over 3 sessions   Time 2   Period Weeks   Status On-going   SLP SHORT TERM GOAL #2   Title pt will  maintain 70dB loudness in 19/20 sentence responses   Time 2   Period Weeks   Status On-going   SLP SHORT TERM GOAL #3   Title pt will tell SLP 3 s/s aspiration PNA with modified independence   Time 2   Period Weeks   Status On-going          SLP Long Term Goals - 08/14/15 1755    SLP LONG TERM GOAL #1   Title pt will sustsin /a/ average 89dB over 4 sessions   Time 6   Period Weeks   Status On-going   SLP LONG TERM GOAL #2   Title pt will engage in 10 minutes mod complex conversation with average 70dB   Time 6   Period Weeks   Status On-going   SLP LONG TERM GOAL #3   Title pt will report improved vocal quality in 10 minutes mod complex conversation, compared to first session ST   Time 6   Period Weeks   Status On-going          Plan - 08/14/15 0940    Clinical Impression Statement Pt cont'd with mild mod dysarthria c/b imprecision of phonemes, rushes of speech, and mod hoarseness due to Parkinson's disease. 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 Pt c/o monotone speech so SLP added pitch glide exercise to pt's loud /a/. Skilled ST remains needed to improve pt's conversational loudness to WNL level as well as address anomia.   Speech Therapy Frequency 2x / week   Duration --  6 weeks   Treatment/Interventions SLP instruction and feedback;Compensatory strategies;Internal/external aids;Patient/family  education;Functional tasks;Cueing hierarchy   Potential to Achieve Goals Good      Patient will benefit from skilled therapeutic intervention in order to improve the following deficits and impairments:   Dysarthria and anarthria    Problem List Patient Active Problem List   Diagnosis Date Noted  . Acute pyelonephritis 12/16/2012  . Sepsis ([place]) 12/15/2012  . Parkinson's disease ([place]) 12/15/2012    Liliahna Cudd ,[place], [place]  08/15/2015, 10:02 AM  [facility] [address] [place], [place], [address] Phone: [phone]   Fax:  [phone]  Name: Corney Coppin MRN: [id number] Date of Birth: 10-14-1945

## 2015-08-16 ENCOUNTER — Ambulatory Visit: Payer: Medicare Other | Admitting: Occupational Therapy

## 2015-08-16 ENCOUNTER — Ambulatory Visit: Payer: Medicare Other | Admitting: Physical Therapy

## 2015-08-16 DIAGNOSIS — R471 Dysarthria and anarthria: Secondary | ICD-10-CM | POA: Diagnosis not present

## 2015-08-16 DIAGNOSIS — R29818 Other symptoms and signs involving the nervous system: Secondary | ICD-10-CM

## 2015-08-16 DIAGNOSIS — R2689 Other abnormalities of gait and mobility: Secondary | ICD-10-CM

## 2015-08-16 DIAGNOSIS — R41844 Frontal lobe and executive function deficit: Secondary | ICD-10-CM

## 2015-08-16 DIAGNOSIS — R2681 Unsteadiness on feet: Secondary | ICD-10-CM | POA: Diagnosis not present

## 2015-08-16 DIAGNOSIS — R29898 Other symptoms and signs involving the musculoskeletal system: Secondary | ICD-10-CM

## 2015-08-16 DIAGNOSIS — R278 Other lack of coordination: Secondary | ICD-10-CM | POA: Diagnosis not present

## 2015-08-16 NOTE — Therapy (Signed)
[facility] [address] [place], [place], [address] Phone: [phone]   Fax:  [phone]  Occupational Therapy Treatment  Patient Details  Name: [name] [name] MRN: [id number] Date of Birth: 23-Jan-1946 Referring Provider: Dr. Maxine Glenn  Encounter Date: 08/16/2015      OT End of Session - 08/16/15 1022    Visit Number 3   Number of Visits 17   Date for OT Re-Evaluation 09/14/15   Authorization Type Medicare   Authorization Time Period G code   Authorization - Visit Number 2   Authorization - Number of Visits 10   OT Start Time 1020   OT Stop Time 1100   OT Time Calculation (min) 40 min   Activity Tolerance Patient tolerated treatment well   Behavior During Therapy [facility] for tasks assessed/performed      Past Medical History  Diagnosis Date  . Parkinson's disease ([place])   . BPH (benign prostatic hypertrophy)   . Peyronie disease   . History of kidney stones   . Left ureteral calculus   . S/P deep brain stimulator placement     11-29-2012  . Lower urinary tract symptoms (LUTS)   . GERD (gastroesophageal reflux disease)   . Wears glasses   . Wears hearing aid     BILATERAL-- INTERMITTANT WEARS  . Renal cyst, left   . Nephrolithiasis   . Fatigue     d/t Parkinson's  . Orthostatic hypotension     d/t Parkinson's disease  . Pneumonia   . Depression   . Constipation     Past Surgical History  Procedure Laterality Date  . Extracorporeal shock wave lithotripsy Left 10-11-2010  &  01-05-2011  . Cysto/  left ureterosocpy/   left ureteral stent placement/  laser bladder stones and extraction  09-25-2010  . Deep brain stimulator placement  11-29-2012    genertor device at left pectoral area--- not activated yet  . Sp perc nephrostomy Left 12-16-2012  . Cystoscopy with retrograde pyelogram, ureteroscopy and stent placement Left 01/06/2013    Procedure: CYSTOSCOPY WITH RETROGRADE PYELOGRAM, URETEROSCOPY AND STENT  PLACEMENT;  Surgeon: Hanley Ben, MD;  Location: [facility];  Service: Urology;  Laterality: Left;  . Holmium laser application Left [id number]    Procedure: HOLMIUM LASER APPLICATION;  Surgeon: Hanley Ben, MD;  Location: [facility];  Service: Urology;  Laterality: Left;  . Stone extraction with basket Left 01/06/2013    Procedure: STONE EXTRACTION WITH BASKET;  Surgeon: Hanley Ben, MD;  Location: [facility];  Service: Urology;  Laterality: Left;  . Negative sleep study  2013  per pt  . Cystoscopy with retrograde pyelogram, ureteroscopy and stent placement Left 09/11/2014    Procedure: CYSTOSCOPY WITH LEFT RETROGRADE PYELOGRAM, URETEROSCOPY AND STENT PLACEMENT;  Surgeon: Lowella Bandy, MD;  Location: [facility];  Service: Urology;  Laterality: Left;  . Holmium laser application Left [id number]    Procedure: HOLMIUM LASER APPLICATION;  Surgeon: Lowella Bandy, MD;  Location: [facility];  Service: Urology;  Laterality: Left;  . Colonoscopy    . Cholecystectomy  12/21/2014    Procedure: LAPAROSCOPIC CHOLECYSTECTOMY;  Surgeon: Ralene Ok, MD;  Location: [facility];  Service: General;;    There were no vitals filed for this visit.      Subjective Assessment - 08/16/15 1022    Pertinent History see EPic, bilateral DBS   Patient Stated Goals improve speed with ADLs, improve balance   Currently in  Pain? Yes   Pain Score 2    Pain Location Hip   Pain Orientation Left   Pain Descriptors / Indicators Aching   Pain Type Chronic pain   Pain Onset More than a month ago   Pain Frequency Intermittent   Aggravating Factors  malpositioning   Pain Relieving Factors repositioning   Multiple Pain Sites No        Handwriting activities tracing shapes and capital letters, then printing with emphasis on larger size. Foam grip issued, mod v.c. For technique with hand writing. Pt demonstrates improved legibility and size with  practice and foam grip.                  [facility]) - 08/16/15 1028    PWR! Up x20   PWR! [employer]! Twist x20   PWR! Step x10   Comments min v.c. for speed and larger amplitude movements.             OT Education - 08/16/15 1056    Education provided Yes   Education Details PWR! hands basic 4, supine PWR! basic 4, handwriting strategies   Person(s) Educated Patient;Spouse   Methods Explanation;Demonstration;Tactile cues;Verbal cues;Handout   Comprehension Verbalized understanding;Returned demonstration;Verbal cues required          OT Short Term Goals - 07/17/15 [id number]    OT SHORT TERM GOAL #1   Title I with PD specific HEP   Time 4   Period Weeks   Status New   OT SHORT TERM GOAL #2   Title Pt will verbalize understanding of adapted strategies for ADLs/IADLs, including ways to minimize freezing episodes.   Time 4   Period Weeks   Status New   OT SHORT TERM GOAL #3   Title Pt will demonstrate improved ease with feeding as evidenced by decreasing PPT#2 to 11 secs or less.   Time 4   Period Weeks   Status New   OT SHORT TERM GOAL #4   Title Pt will demonstrate ability to write a paragraph with 100% legibility, and no significant decrease in letter size.   Time 4   Period Weeks   Status New           OT Long Term Goals - 07/17/15 0930    OT LONG TERM GOAL #1   Title Pt will verbalize understanding of ways to prevent future PD related complications and verbalize understanding of community resources.   Time 8   Period Weeks   Status New   OT LONG TERM GOAL #2   Title Pt will demonstrate increased ease with dressing as evidenced by decreasing 3 button/ unbutton to 35 secs or less   Baseline 39.06 secs.   Time 8   Period Weeks   Status New   OT LONG TERM GOAL #3   Title Pt will verbalize understanding of compenstaory strategies for cognitive deficits, and ways to keep thinking skills sharp.   Time 8   Period Weeks   Status New   OT  LONG TERM GOAL #4   Title Pt will demonstrate improved bilateral UE functional use as evidenced by increasing bilateral box/ blocks by 4 blocks.   Baseline RUE 38 blocks, LUE 42 blocks   Time 8   Period Weeks   Status New               Plan - 08/16/15 1054    Clinical Impression Statement Pt demonstrates understanding of PWR! basic 4  in supine. Pt is progressing towards goals.   Rehab Potential Good   OT Frequency 2x / week   OT Duration 8 weeks   OT Treatment/Interventions Self-care/ADL training;Therapeutic exercise;Patient/family education;Balance training;Splinting;Manual Therapy;Neuromuscular education;Ultrasound;Therapeutic exercises;Therapeutic activities;DME and/or AE instruction;Parrafin;Cryotherapy;Cognitive remediation/compensation;Visual/perceptual remediation/compensation;Passive range of motion;Contrast Bath;Moist Heat;Fluidtherapy;Energy conservation;Functional Mobility Training   Plan reinforce coordination HEP   OT Home Exercise Plan issued PWR! basic 4 supine, coordination HEP, handwriting strategies, PWR! hands   Consulted and Agree with Plan of Care Patient;Family member/caregiver   Family Member Consulted wife, son      Patient will benefit from skilled therapeutic intervention in order to improve the following deficits and impairments:  Abnormal gait, Decreased coordination, Decreased range of motion, Difficulty walking, Impaired flexibility, Impaired sensation, Increased edema, Decreased safety awareness, Decreased endurance, Decreased activity tolerance, Decreased knowledge of precautions, Impaired tone, Pain, Impaired UE functional use, Decreased knowledge of use of DME, Decreased balance, Decreased cognition, Decreased mobility, Decreased strength  Visit Diagnosis: Other symptoms and signs involving the nervous system  Other symptoms and signs involving the musculoskeletal system  Frontal lobe and executive function deficit  Other lack of  coordination    Problem List Patient Active Problem List   Diagnosis Date Noted  . Acute pyelonephritis 12/16/2012  . Sepsis ([place]) 12/15/2012  . Parkinson's disease ([place]) 12/15/2012    [name] [name] 08/16/2015, 11:01 AM Theone Murdoch, OTR/L [phone] Phone: [phone] 11:01 AM 07/14/2017[facility] [address] [place], [place], [address] Phone: [phone]   Fax:  [phone]  Name: [name] [name] MRN: [id number] Date of Birth: 06-05-1945

## 2015-08-16 NOTE — Therapy (Signed)
[facility] [address] [place], [place], [address] Phone: [phone]   Fax:  [phone]  Physical Therapy Treatment  Patient Details  Name: [name] [name] MRN: [id number] Date of Birth: 01/02/1946 Referring Provider: Maxine Glenn  Encounter Date: 08/16/2015      PT End of Session - 08/16/15 1202    Visit Number 7   Number of Visits 17   Date for PT Re-Evaluation 09/15/15   Authorization Type Medicare/ BCBS-Gcode every 10th visit   PT Start Time 1106   PT Stop Time 1146   PT Time Calculation (min) 40 min   Activity Tolerance Patient tolerated treatment well   Behavior During Therapy [facility] for tasks assessed/performed      Past Medical History  Diagnosis Date  . Parkinson's disease ([place])   . BPH (benign prostatic hypertrophy)   . Peyronie disease   . History of kidney stones   . Left ureteral calculus   . S/P deep brain stimulator placement     11-29-2012  . Lower urinary tract symptoms (LUTS)   . GERD (gastroesophageal reflux disease)   . Wears glasses   . Wears hearing aid     BILATERAL-- INTERMITTANT WEARS  . Renal cyst, left   . Nephrolithiasis   . Fatigue     d/t Parkinson's  . Orthostatic hypotension     d/t Parkinson's disease  . Pneumonia   . Depression   . Constipation     Past Surgical History  Procedure Laterality Date  . Extracorporeal shock wave lithotripsy Left 10-11-2010  &  01-05-2011  . Cysto/  left ureterosocpy/   left ureteral stent placement/  laser bladder stones and extraction  09-25-2010  . Deep brain stimulator placement  11-29-2012    genertor device at left pectoral area--- not activated yet  . Sp perc nephrostomy Left 12-16-2012  . Cystoscopy with retrograde pyelogram, ureteroscopy and stent placement Left 01/06/2013    Procedure: CYSTOSCOPY WITH RETROGRADE PYELOGRAM, URETEROSCOPY AND STENT PLACEMENT;  Surgeon: Hanley Ben, MD;  Location: [facility];   Service: Urology;  Laterality: Left;  . Holmium laser application Left 00/09/6759    Procedure: HOLMIUM LASER APPLICATION;  Surgeon: Hanley Ben, MD;  Location: [facility];  Service: Urology;  Laterality: Left;  . Stone extraction with basket Left 01/06/2013    Procedure: STONE EXTRACTION WITH BASKET;  Surgeon: Hanley Ben, MD;  Location: [facility];  Service: Urology;  Laterality: Left;  . Negative sleep study  2013  per pt  . Cystoscopy with retrograde pyelogram, ureteroscopy and stent placement Left 09/11/2014    Procedure: CYSTOSCOPY WITH LEFT RETROGRADE PYELOGRAM, URETEROSCOPY AND STENT PLACEMENT;  Surgeon: Lowella Bandy, MD;  Location: [facility];  Service: Urology;  Laterality: Left;  . Holmium laser application Left 10/08/930    Procedure: HOLMIUM LASER APPLICATION;  Surgeon: Lowella Bandy, MD;  Location: [facility];  Service: Urology;  Laterality: Left;  . Colonoscopy    . Cholecystectomy  12/21/2014    Procedure: LAPAROSCOPIC CHOLECYSTECTOMY;  Surgeon: Ralene Ok, MD;  Location: [facility];  Service: General;;    There were no vitals filed for this visit.      Subjective Assessment - 08/16/15 1104    Pertinent History Parkinson's disease since 2008; deep brain stimulation R side (L brain) 2014, L side (R brain)April 2017   Patient Stated Goals Pt's goal for therapy is to improve strength, feel more comfortable that I won't fall when  I'm walking.                         [facility] Adult PT Treatment/Exercise - 08/16/15 0001    Transfers   Transfers Sit to Stand   Sit to Stand 6: Modified independent (Device/Increase time);Without upper extremity assist;From chair/3-in-1   Stand to Sit 6: Modified independent (Device/Increase time)   Number of Reps Other reps (comment)  2 sets of 5 reps   Transfer Cueing Cues provided for forward scoot, forward lean and increased forward momentum.   Ambulation/Gait    Ambulation/Gait Yes   Ambulation/Gait Assistance 5: Supervision   Ambulation/Gait Assistance Details Gait activities, with frequent cues for increased step length and increased foot clearance.  No evidence of the R foot drawing into inversion and supination as last visit.  Practiced turning tips and strategies for tight spaces, including marching turns and weigthshifting turns.    Ambulation Distance (Feet) 230 Feet  x 2, then negotiating tight spaces around gym 2x   Assistive device None   Gait Pattern Step-through pattern;Decreased arm swing - left;Decreased step length - right;Decreased step length - left;Decreased trunk rotation  improves with cues   Ambulation Surface Level;Indoor   Pre-Gait Activities Weightshifting laterally and up and back with cues for deliberate weightshifting, with changes of directions, cues to keep feet wide apart.   Gait Comments Gait activities with turns, progressively adding cognitive/conversation tasks, carrying tasks, with weightshifting turns.  Cues for slowed pace, large, deliberate movement patterns.   High Level Balance   High Level Balance Comments Forward step and weigthshift, alternating legs x 10 reps each, stepping over hurdle, then side step and weightshift alternating legs, over hurdle x 10 reps, for increased intensity of movement for improved foot clearance.     Forward, backward walking, 3 reps x 15 ft each direction.  Marching forward, with coordinated UE movement patterns.  Sidestepping x 15 ft each direction.  Stepping and stopping with varied change of directions, no LOB, with therapist supervision, for improved balance with direction changes.      [facility]) - 08/16/15 1152    PWR! exercises Moves in sitting;Moves in standing   PWR! Up x 10   PWR! Rock x 10   PWR! Twist x 10   PWR Step  x 10 side step, x 10 each back step, x 10 each forward step   Comments In standing, pt apperars to be performing exercises independently.  Needs UE  support for back step and weightshift.   PWR! Up x 10   PWR! Rock x 10    PWR! Twist x 10   PWR! Step x 10   Comments In sitting, pt appears to be performing exercises independently.               PT Short Term Goals - 08/16/15 1105    PT SHORT TERM GOAL #1   Title Pt will perform HEP for improved balance, transfers, gait and posture/decreased pain.  TARGET 08/15/15   Time 4   Period Weeks   Status Achieved   PT SHORT TERM GOAL #2   Title Pt will improve 5x sit<>stand to less than or equal to 16 seconds for improved transfer efficiency and safety.   Time 4   Period Weeks   Status Not Met   PT SHORT TERM GOAL #3   Title Pt will improve TUG score to less than or equal to 13.5 seconds for decreased fall risk.  Status Not Met   PT SHORT TERM GOAL #4   Title Pt will improve Functional Gait Assessment to at least 18/30 for decreased fall risk.   Time 4   Period Weeks   Status Achieved   PT SHORT TERM GOAL #5   Title Pt/family will verbalize understanding of local Parkinson's disease resources.   Period Weeks   Status On-going           PT Long Term Goals - 08/16/15 1203    PT LONG TERM GOAL #1   Title Pt will verbalize/demonstrate understanding of tips to reduce freezing with gait and turns.  TARGET 09/16/15   Time 8   Period Weeks   Status On-going   PT LONG TERM GOAL #2   Title Pt will improve 5x sit<>stand to less than or equal to 14 secondsf or improved efficiency and safety with transfers.   Time 8   Period Weeks   Status On-going   PT LONG TERM GOAL #3   Title Pt will improve TUG manual to less than or equal to 15 seconds for improved dual tasking with gait.   Time 8   Period Weeks   Status On-going   PT LONG TERM GOAL #4   Title PT will improve Fucntional Gait assessment to at least 20/30 for decreased fall risk.   Time 8   Period Weeks   Status On-going   PT LONG TERM GOAL #5   Title Pt will improve gait velocity to at elast 2.62 ft/sec for imrpoved  gait efficiency and safety.   Time 8   Period Weeks   Status On-going   Additional Long Term Goals   Additional Long Term Goals Yes   PT LONG TERM GOAL #6   Title Pt will verbalize plans for continued community fitness upon D/C from PT.   Time 8   Period Weeks   Status On-going   PT LONG TERM GOAL #7   Title Pt/family will verbalize understanding of local Parkinson's disease resources.   Time 8   Period Weeks   Status On-going               Plan - 08/16/15 1204    Clinical Impression Statement Pt has met STG #1 and 4.  STG #2 and 3 not met.  STG # 5 ongoing(added to LTGs).  Pt notes functional improvements within home environment, with being more aware of movements in general.  Pt continues to need cues for increased amplitude and intensity of movement patterns.  Pt will continue to benefit from further skilled PT to address balance, trasnfers, gait.   Rehab Potential Good   PT Frequency 2x / week   PT Duration 8 weeks  plus eval   PT Treatment/Interventions ADLs/Self Care Home Management;Therapeutic exercise;Therapeutic activities;Functional mobility training;Stair training;Gait training;DME Instruction;Balance training;Neuromuscular re-education;Patient/family education   PT Next Visit Plan Discuss fall prevention, Power over Parkinson's information, continue dynamic gait and balance activities, turns and weightshifting, variable direction stepping   Consulted and Agree with Plan of Care Patient      Patient will benefit from skilled therapeutic intervention in order to improve the following deficits and impairments:  Abnormal gait, Decreased balance, Decreased mobility, Decreased range of motion, Decreased safety awareness, Decreased strength, Difficulty walking, Postural dysfunction, Pain  Visit Diagnosis: Other abnormalities of gait and mobility  Other symptoms and signs involving the nervous system     Problem List Patient Active Problem List   Diagnosis Date  Noted  .  Acute pyelonephritis 12/16/2012  . Sepsis ([place]) 12/15/2012  . Parkinson's disease ([place]) 12/15/2012    [name] [name] W. 08/16/2015, 12:11 PM  Frazier Butt., PT  [facility] [address] [place], [place], [address] Phone: [phone]   Fax:  [phone]  Name: [name] [name] MRN: [id number] Date of Birth: 01-Jul-1945

## 2015-08-21 ENCOUNTER — Ambulatory Visit: Payer: Medicare Other | Admitting: Physical Therapy

## 2015-08-21 ENCOUNTER — Ambulatory Visit: Payer: Medicare Other

## 2015-08-21 ENCOUNTER — Ambulatory Visit: Payer: Medicare Other | Admitting: Occupational Therapy

## 2015-08-21 DIAGNOSIS — R2681 Unsteadiness on feet: Secondary | ICD-10-CM | POA: Diagnosis not present

## 2015-08-21 DIAGNOSIS — R471 Dysarthria and anarthria: Secondary | ICD-10-CM | POA: Diagnosis not present

## 2015-08-21 DIAGNOSIS — R29898 Other symptoms and signs involving the musculoskeletal system: Secondary | ICD-10-CM

## 2015-08-21 DIAGNOSIS — R2689 Other abnormalities of gait and mobility: Secondary | ICD-10-CM | POA: Diagnosis not present

## 2015-08-21 DIAGNOSIS — R278 Other lack of coordination: Secondary | ICD-10-CM

## 2015-08-21 DIAGNOSIS — R29818 Other symptoms and signs involving the nervous system: Secondary | ICD-10-CM

## 2015-08-21 DIAGNOSIS — R41844 Frontal lobe and executive function deficit: Secondary | ICD-10-CM

## 2015-08-21 NOTE — Therapy (Signed)
[facility] [address] [place], [place], [address] Phone: [phone]   Fax:  [phone]  Occupational Therapy Treatment  Patient Details  Name: [name] [name] MRN: [id number] Date of Birth: Nov 05, 1945 Referring Provider: Dr. Maxine Glenn  Encounter Date: 08/21/2015      OT End of Session - 08/21/15 0835    Visit Number 4   Number of Visits 17   Date for OT Re-Evaluation 09/14/15   Authorization Type Medicare   Authorization Time Period G code   Authorization - Visit Number 4   Authorization - Number of Visits 10   OT Start Time 0808   OT Stop Time 0847   OT Time Calculation (min) 39 min   Activity Tolerance Patient tolerated treatment well   Behavior During Therapy [facility] for tasks assessed/performed      Past Medical History  Diagnosis Date  . Parkinson's disease ([place])   . BPH (benign prostatic hypertrophy)   . Peyronie disease   . History of kidney stones   . Left ureteral calculus   . S/P deep brain stimulator placement     11-29-2012  . Lower urinary tract symptoms (LUTS)   . GERD (gastroesophageal reflux disease)   . Wears glasses   . Wears hearing aid     BILATERAL-- INTERMITTANT WEARS  . Renal cyst, left   . Nephrolithiasis   . Fatigue     d/t Parkinson's  . Orthostatic hypotension     d/t Parkinson's disease  . Pneumonia   . Depression   . Constipation     Past Surgical History  Procedure Laterality Date  . Extracorporeal shock wave lithotripsy Left 10-11-2010  &  01-05-2011  . Cysto/  left ureterosocpy/   left ureteral stent placement/  laser bladder stones and extraction  09-25-2010  . Deep brain stimulator placement  11-29-2012    genertor device at left pectoral area--- not activated yet  . Sp perc nephrostomy Left 12-16-2012  . Cystoscopy with retrograde pyelogram, ureteroscopy and stent placement Left 01/06/2013    Procedure: CYSTOSCOPY WITH RETROGRADE PYELOGRAM, URETEROSCOPY AND STENT  PLACEMENT;  Surgeon: Hanley Ben, MD;  Location: [facility];  Service: Urology;  Laterality: Left;  . Holmium laser application Left [id number]    Procedure: HOLMIUM LASER APPLICATION;  Surgeon: Hanley Ben, MD;  Location: [facility];  Service: Urology;  Laterality: Left;  . Stone extraction with basket Left 01/06/2013    Procedure: STONE EXTRACTION WITH BASKET;  Surgeon: Hanley Ben, MD;  Location: [facility];  Service: Urology;  Laterality: Left;  . Negative sleep study  2013  per pt  . Cystoscopy with retrograde pyelogram, ureteroscopy and stent placement Left 09/11/2014    Procedure: CYSTOSCOPY WITH LEFT RETROGRADE PYELOGRAM, URETEROSCOPY AND STENT PLACEMENT;  Surgeon: Lowella Bandy, MD;  Location: [facility];  Service: Urology;  Laterality: Left;  . Holmium laser application Left [id number]    Procedure: HOLMIUM LASER APPLICATION;  Surgeon: Lowella Bandy, MD;  Location: [facility];  Service: Urology;  Laterality: Left;  . Colonoscopy    . Cholecystectomy  12/21/2014    Procedure: LAPAROSCOPIC CHOLECYSTECTOMY;  Surgeon: Ralene Ok, MD;  Location: [facility];  Service: General;;    There were no vitals filed for this visit.      Subjective Assessment - 08/21/15 0824    Subjective  neck pain   Pertinent History see EPic, bilateral DBS   Patient Stated Goals improve speed with ADLs,  improve balance   Currently in Pain? Yes   Pain Score 6    Pain Location Neck   Pain Descriptors / Indicators Aching   Pain Type Chronic pain   Pain Onset More than a month ago   Pain Frequency Intermittent   Aggravating Factors  malpositioning   Pain Relieving Factors repositioning   Multiple Pain Sites No        Flipping and dealing cards with emphasis on large amplitude movements, min-mod v.c. And demonstration Arm bike x 5 mins level 1 for conditioning, pt maintained 40 rpm                 [facility]) -  08/21/15 1302    PWR! exercises Moves in [place]! Up 10   PWR! Rock 10   PWR! Twist 20   PWR! Step 20   Comments modified quadraped, miod v.c. and demonstration.               OT Short Term Goals - 08/21/15 1304    OT SHORT TERM GOAL #1   Title I with PD specific HEP   Time 4   Period Weeks   Status On-going   OT SHORT TERM GOAL #2   Title Pt will verbalize understanding of adapted strategies for ADLs/IADLs, including ways to minimize freezing episodes.   Time 4   Period Weeks   Status On-going   OT SHORT TERM GOAL #3   Title Pt will demonstrate improved ease with feeding as evidenced by decreasing PPT#2 to 11 secs or less.   Time 4   Period Weeks   Status On-going   OT SHORT TERM GOAL #4   Title Pt will demonstrate ability to write a paragraph with 100% legibility, and no significant decrease in letter size.   Time 4   Period Weeks   Status On-going           OT Long Term Goals - 07/17/15 0930    OT LONG TERM GOAL #1   Title Pt will verbalize understanding of ways to prevent future PD related complications and verbalize understanding of community resources.   Time 8   Period Weeks   Status New   OT LONG TERM GOAL #2   Title Pt will demonstrate increased ease with dressing as evidenced by decreasing 3 button/ unbutton to 35 secs or less   Baseline 39.06 secs.   Time 8   Period Weeks   Status New   OT LONG TERM GOAL #3   Title Pt will verbalize understanding of compenstaory strategies for cognitive deficits, and ways to keep thinking skills sharp.   Time 8   Period Weeks   Status New   OT LONG TERM GOAL #4   Title Pt will demonstrate improved bilateral UE functional use as evidenced by increasing bilateral box/ blocks by 4 blocks.   Baseline RUE 38 blocks, LUE 42 blocks   Time 8   Period Weeks   Status New               Plan - 08/21/15 [id number]    Clinical Impression Statement Pt is progressing towards goals with improving carryover of  large amplitude movements.   Rehab Potential Good   OT Frequency 2x / week   OT Duration 8 weeks   OT Treatment/Interventions Self-care/ADL training;Therapeutic exercise;Patient/family education;Balance training;Splinting;Manual Therapy;Neuromuscular education;Ultrasound;Therapeutic exercises;Therapeutic activities;DME and/or AE instruction;Parrafin;Cryotherapy;Cognitive remediation/compensation;Visual/perceptual remediation/compensation;Passive range of motion;Contrast Bath;Moist Heat;Fluidtherapy;Energy conservation;Functional Mobility Training   Plan review PWR!  modified quadraped   OT Home Exercise Plan issued PWR! basic 4 supine, coordination HEP, handwriting strategies, PWR! hands   Consulted and Agree with Plan of Care Patient;Family member/caregiver   Family Member Consulted wife, son      Patient will benefit from skilled therapeutic intervention in order to improve the following deficits and impairments:  Abnormal gait, Decreased coordination, Decreased range of motion, Difficulty walking, Impaired flexibility, Impaired sensation, Increased edema, Decreased safety awareness, Decreased endurance, Decreased activity tolerance, Decreased knowledge of precautions, Impaired tone, Pain, Impaired UE functional use, Decreased knowledge of use of DME, Decreased balance, Decreased cognition, Decreased mobility, Decreased strength  Visit Diagnosis: Other symptoms and signs involving the nervous system  Other symptoms and signs involving the musculoskeletal system  Frontal lobe and executive function deficit  Other lack of coordination  Unsteadiness on feet    Problem List Patient Active Problem List   Diagnosis Date Noted  . Acute pyelonephritis 12/16/2012  . Sepsis ([place]) 12/15/2012  . Parkinson's disease ([place]) 12/15/2012    Philip Eckersley 08/21/2015, 1:06 PM Theone Murdoch, OTR/L [phone] Phone: [phone] 1:06 PM 07/19/2017[facility] [address] [place], [place], [address] Phone: [phone]   Fax:  [phone]  Name: [name] [name] MRN: [id number] Date of Birth: 10-07-1945

## 2015-08-21 NOTE — Therapy (Signed)
[facility] [address] [place], [place], [address] Phone: [phone]   Fax:  [phone]  Speech Language Pathology Treatment  Patient Details  Name: [name] [name] MRN: [id number] Date of Birth: October 26, 1945 No Data Recorded  Encounter Date: 08/21/2015      End of Session - 08/21/15 1319    Visit Number 6   Number of Visits 17   Date for SLP Re-Evaluation 09/27/15   SLP Start Time 1203  pt was 18 minutes late to therapy   SLP Stop Time  26   SLP Time Calculation (min) 27 min   Activity Tolerance Patient tolerated treatment well      Past Medical History  Diagnosis Date  . Parkinson's disease ([place])   . BPH (benign prostatic hypertrophy)   . Peyronie disease   . History of kidney stones   . Left ureteral calculus   . S/P deep brain stimulator placement     11-29-2012  . Lower urinary tract symptoms (LUTS)   . GERD (gastroesophageal reflux disease)   . Wears glasses   . Wears hearing aid     BILATERAL-- INTERMITTANT WEARS  . Renal cyst, left   . Nephrolithiasis   . Fatigue     d/t Parkinson's  . Orthostatic hypotension     d/t Parkinson's disease  . Pneumonia   . Depression   . Constipation     Past Surgical History  Procedure Laterality Date  . Extracorporeal shock wave lithotripsy Left 10-11-2010  &  01-05-2011  . Cysto/  left ureterosocpy/   left ureteral stent placement/  laser bladder stones and extraction  09-25-2010  . Deep brain stimulator placement  11-29-2012    genertor device at left pectoral area--- not activated yet  . Sp perc nephrostomy Left 12-16-2012  . Cystoscopy with retrograde pyelogram, ureteroscopy and stent placement Left 01/06/2013    Procedure: CYSTOSCOPY WITH RETROGRADE PYELOGRAM, URETEROSCOPY AND STENT PLACEMENT;  Surgeon: Hanley Ben, MD;  Location: [facility];  Service: Urology;  Laterality: Left;  . Holmium laser application Left [id number]   Procedure: HOLMIUM LASER APPLICATION;  Surgeon: Hanley Ben, MD;  Location: [facility];  Service: Urology;  Laterality: Left;  . Stone extraction with basket Left 01/06/2013    Procedure: STONE EXTRACTION WITH BASKET;  Surgeon: Hanley Ben, MD;  Location: [facility];  Service: Urology;  Laterality: Left;  . Negative sleep study  2013  per pt  . Cystoscopy with retrograde pyelogram, ureteroscopy and stent placement Left 09/11/2014    Procedure: CYSTOSCOPY WITH LEFT RETROGRADE PYELOGRAM, URETEROSCOPY AND STENT PLACEMENT;  Surgeon: Lowella Bandy, MD;  Location: [facility];  Service: Urology;  Laterality: Left;  . Holmium laser application Left [id number]    Procedure: HOLMIUM LASER APPLICATION;  Surgeon: Lowella Bandy, MD;  Location: [facility];  Service: Urology;  Laterality: Left;  . Colonoscopy    . Cholecystectomy  12/21/2014    Procedure: LAPAROSCOPIC CHOLECYSTECTOMY;  Surgeon: Ralene Ok, MD;  Location: [facility];  Service: General;;    There were no vitals filed for this visit.      Subjective Assessment - 08/21/15 1150    Subjective Pt walked into ST room with sub-70dB speech. No attempt to correct loudness.   Currently in Pain? No/denies               ADULT SLP TREATMENT - 08/21/15 1207    General Information   Behavior/Cognition Alert;Cooperative;Pleasant mood  Treatment Provided   Treatment provided Cognitive-Linquistic   Cognitive-Linquistic Treatment   Treatment focused on Dysarthria   Skilled Treatment As pt entered ST room loudness was sub-70dB. SLP facilitated louder speech by loud /a/ with average 90dB. SLP used opposites to warm pt up - average 72dB. In sentence (simple) task, pt's speech was average 70dB with rare min A. In sentence task (mod complex) pt req'd min A occasionally to facilitate louder speech.   Assessment / Recommendations / Plan   Plan Continue with current plan of care   Progression  Toward Goals   Progression toward goals Progressing toward goals            SLP Short Term Goals - 08/21/15 1322    SLP SHORT TERM GOAL #1   Title pt will sustain /a/ average 88dB over 3 sessions   Time 1   Period Weeks   Status On-going   SLP SHORT TERM GOAL #2   Title pt will  maintain 70dB loudness in 19/20 sentence responses   Status Achieved   SLP SHORT TERM GOAL #3   Title pt will tell SLP 3 s/s aspiration PNA with modified independence   Time 1   Period Weeks   Status On-going          SLP Long Term Goals - 08/21/15 1323    SLP LONG TERM GOAL #1   Title pt will sustsin /a/ average 89dB over 4 sessions   Baseline one session 08-21-15   Time 5   Period Weeks   Status On-going   SLP LONG TERM GOAL #2   Title pt will engage in 10 minutes mod complex conversation with average 70dB   Time 5   Period Weeks   Status On-going   SLP LONG TERM GOAL #3   Title pt will report improved vocal quality in 10 minutes mod complex conversation, compared to first session ST   Time 5   Period Weeks   Status On-going          Plan - 08/21/15 1320    Clinical Impression Statement 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 When particpating in simple to mod complex setnence tasks, pt requires min cues occasionally for success (at or above 70dB average).  Skilled ST remains needed to improve pt's conversational loudness to WNL level as well as address anomia.   Speech Therapy Frequency 2x / week   Duration --  5 weeks   Treatment/Interventions SLP instruction and feedback;Compensatory strategies;Internal/external aids;Patient/family education;Functional tasks;Cueing hierarchy   Potential to Achieve Goals Good      Patient will benefit from skilled therapeutic intervention in order to improve the following deficits and impairments:   Dysarthria and anarthria    Problem List Patient  Active Problem List   Diagnosis Date Noted  . Acute pyelonephritis 12/16/2012  . Sepsis ([place]) 12/15/2012  . Parkinson's disease ([place]) 12/15/2012    Marysa Wessner ,[place], [place]  08/21/2015, 1:25 PM  [facility] [address] [place], [place], [address] Phone: [phone]   Fax:  [phone]   Name: [name] [name] MRN: [id number] Date of Birth: 11/18/1945

## 2015-08-21 NOTE — Patient Instructions (Signed)
Fall Prevention in the Home  Falls can cause injuries and can affect people from all age groups. There are many simple things that you can do to make your home safe and to help prevent falls. WHAT CAN I DO ON THE OUTSIDE OF MY HOME? 1. Regularly repair the edges of walkways and driveways and fix any cracks. 2. Remove high doorway thresholds. 3. Trim any shrubbery on the main path into your home. 4. Use bright outdoor lighting. 5. Clear walkways of debris and clutter, including tools and rocks. 6. Regularly check that handrails are securely fastened and in good repair. Both sides of any steps should have handrails. 7. Install guardrails along the edges of any raised decks or porches. 8. Have leaves, snow, and ice cleared regularly. 9. Use sand or salt on walkways during winter months. 10. In the garage, clean up any spills right away, including grease or oil spills. WHAT CAN I DO IN THE BATHROOM?  Use night lights.  Install grab bars by the toilet and in the tub and shower. Do not use towel bars as grab bars.  Use non-skid mats or decals on the floor of the tub or shower.  If you need to sit down while you are in the shower, use a plastic, non-slip stool.[employer] Kitchen  Keep the floor dry. Immediately clean up any water that spills on the floor.  Remove soap buildup in the tub or shower on a regular basis.  Attach bath mats securely with double-sided non-slip rug tape.  Remove throw rugs and other tripping hazards from the floor. WHAT CAN I DO IN THE BEDROOM?  Use night lights.  Make sure that a bedside light is easy to reach.  Do not use oversized bedding that drapes onto the floor.  Have a firm chair that has side arms to use for getting dressed.  Remove throw rugs and other tripping hazards from the floor. WHAT CAN I DO IN THE KITCHEN?   Clean up any spills right away.  Avoid walking on wet floors.  Place frequently used items in easy-to-reach places.  If you need to reach for  something above you, use a sturdy step stool that has a grab bar.  Keep electrical cables out of the way.  Do not use floor polish or wax that makes floors slippery. If you have to use wax, make sure that it is non-skid floor wax.  Remove throw rugs and other tripping hazards from the floor. WHAT CAN I DO IN THE STAIRWAYS?  Do not leave any items on the stairs.  Make sure that there are handrails on both sides of the stairs. Fix handrails that are broken or loose. Make sure that handrails are as long as the stairways.  Check any carpeting to make sure that it is firmly attached to the stairs. Fix any carpet that is loose or worn.  Avoid having throw rugs at the top or bottom of stairways, or secure the rugs with carpet tape to prevent them from moving.  Make sure that you have a light switch at the top of the stairs and the bottom of the stairs. If you do not have them, have them installed. WHAT ARE SOME OTHER FALL PREVENTION TIPS?  Wear closed-toe shoes that fit well and support your feet. Wear shoes that have rubber soles or low heels.  When you use a stepladder, make sure that it is completely opened and that the sides are firmly locked. Have someone hold the ladder while you  are using it. Do not climb a closed stepladder.  Add color or contrast paint or tape to grab bars and handrails in your home. Place contrasting color strips on the first and last steps.  Use mobility aids as needed, such as canes, walkers, scooters, and crutches.  Turn on lights if it is dark. Replace any light bulbs that burn out.  Set up furniture so that there are clear paths. Keep the furniture in the same spot.  Fix any uneven floor surfaces.  Choose a carpet design that does not hide the edge of steps of a stairway.  Be aware of any and all pets.  Review your medicines with your healthcare provider. Some medicines can cause dizziness or changes in blood pressure, which increase your risk of  falling. Talk with your health care provider about other ways that you can decrease your risk of falls. This may include working with a physical therapist or trainer to improve your strength, balance, and endurance.   This information is not intended to replace advice given to you by your health care provider. Make sure you discuss any questions you have with your health care provider.   Document Released: 01/09/2002 Document Revised: 06/05/2014 Document Reviewed: 02/23/2014 Elsevier Interactive Patient Education [employer].  It is important to avoid accidents which may result in broken bones.  Here are a few ideas on how to make your home safer so you will be less likely to trip or fall.  11. Use nonskid mats or non slip strips in your shower or tub, on your bathroom floor and around sinks.  If you know that you have spilled water, wipe it up! 12. In the bathroom, it is important to have properly installed grab bars on the walls or on the edge of the tub.  Towel racks are NOT strong enough for you to hold onto or to pull on for support. 13. Stairs and hallways should have enough light.  Add lamps or night lights if you need ore light. 14. It is good to have handrails on both sides of the stairs if possible.  Always fix broken handrails right away. 15. It is important to see the edges of steps.  Paint the edges of outdoor steps white so you can see them better.  Put colored tape on the edge of inside steps. 16. Throw-rugs are dangerous because they can slide.  Removing the rugs is the best idea, but if they must stay, add adhesive carpet tape to prevent slipping. 17. Do not keep things on stairs or in the halls.  Remove small furniture that blocks the halls as it may cause you to trip.  Keep telephone and electrical cords out of the way where you walk. 18. Always were sturdy, rubber-soled shoes for good support.  Never wear just socks, especially on the stairs.  Socks may cause you to slip  or fall.  Do not wear full-length housecoats as you can easily trip on the bottom.  19. Place the things you use the most on the shelves that are the easiest to reach.  If you use a stepstool, make sure it is in good condition.  If you feel unsteady, DO NOT climb, ask for help. 20. If a health professional advises you to use a cane or walker, do not be ashamed.  These items can keep you from falling and breaking your bones.       Optimal Fitness Program after Therapy for People with Parkinson's Disease  1)  Therapy Home Exercise Program  -Do these Exercises DAILY as instructed by your therapist  -Big, deliberate effort with exercises  -These exercises are important to perform consistently, even when therapist has  finished, because these therapy exercises often address your specific Parkinson's difficulties   2)  Walking  -  Work up to walking 3-5 times per week, 20-30 minutes per day  -This can be done at home, driveway, quiet street or an indoor track  -Focus should be on your Best posture, arm swing, step length for your best walking pattern  3)  Aerobic Exercise  -Work up to 3-5 times per week, 30 minutes per day  -This can be stationary bike, seated stepper machine, elliptical machine  -Work up to 7-8/10 intensity during the exercise, at minimal to moderate     Resistance

## 2015-08-21 NOTE — Therapy (Signed)
[facility] [address] [place], [place], [address] Phone: [phone]   Fax:  [phone]  Physical Therapy Treatment  Patient Details  Name: [name] [name] MRN: [id number] Date of Birth: 08-09-1945 Referring Provider: Maxine Glenn  Encounter Date: 08/21/2015      PT End of Session - 08/21/15 0946    Visit Number 8   Number of Visits 17   Date for PT Re-Evaluation 09/15/15   Authorization Type Medicare/ BCBS-Gcode every 10th visit   PT Start Time 0848   PT Stop Time 0935   PT Time Calculation (min) 47 min   Activity Tolerance Patient tolerated treatment well   Behavior During Therapy [facility] for tasks assessed/performed      Past Medical History  Diagnosis Date  . Parkinson's disease ([place])   . BPH (benign prostatic hypertrophy)   . Peyronie disease   . History of kidney stones   . Left ureteral calculus   . S/P deep brain stimulator placement     11-29-2012  . Lower urinary tract symptoms (LUTS)   . GERD (gastroesophageal reflux disease)   . Wears glasses   . Wears hearing aid     BILATERAL-- INTERMITTANT WEARS  . Renal cyst, left   . Nephrolithiasis   . Fatigue     d/t Parkinson's  . Orthostatic hypotension     d/t Parkinson's disease  . Pneumonia   . Depression   . Constipation     Past Surgical History  Procedure Laterality Date  . Extracorporeal shock wave lithotripsy Left 10-11-2010  &  01-05-2011  . Cysto/  left ureterosocpy/   left ureteral stent placement/  laser bladder stones and extraction  09-25-2010  . Deep brain stimulator placement  11-29-2012    genertor device at left pectoral area--- not activated yet  . Sp perc nephrostomy Left 12-16-2012  . Cystoscopy with retrograde pyelogram, ureteroscopy and stent placement Left 01/06/2013    Procedure: CYSTOSCOPY WITH RETROGRADE PYELOGRAM, URETEROSCOPY AND STENT PLACEMENT;  Surgeon: Hanley Ben, MD;  Location: [place];   Service: Urology;  Laterality: Left;  . Holmium laser application Left 93/06/7015    Procedure: HOLMIUM LASER APPLICATION;  Surgeon: Hanley Ben, MD;  Location: [place];  Service: Urology;  Laterality: Left;  . Stone extraction with basket Left 01/06/2013    Procedure: STONE EXTRACTION WITH BASKET;  Surgeon: Hanley Ben, MD;  Location: [facility];  Service: Urology;  Laterality: Left;  . Negative sleep study  2013  per pt  . Cystoscopy with retrograde pyelogram, ureteroscopy and stent placement Left 09/11/2014    Procedure: CYSTOSCOPY WITH LEFT RETROGRADE PYELOGRAM, URETEROSCOPY AND STENT PLACEMENT;  Surgeon: Lowella Bandy, MD;  Location: [facility];  Service: Urology;  Laterality: Left;  . Holmium laser application Left 08/10/3901    Procedure: HOLMIUM LASER APPLICATION;  Surgeon: Lowella Bandy, MD;  Location: [facility];  Service: Urology;  Laterality: Left;  . Colonoscopy    . Cholecystectomy  12/21/2014    Procedure: LAPAROSCOPIC CHOLECYSTECTOMY;  Surgeon: Ralene Ok, MD;  Location: [place];  Service: General;;    There were no vitals filed for this visit.      Subjective Assessment - 08/21/15 0943    Subjective Denies falls or changes since last visit.  Asking about using recumbent bike at [facility].   Patient is accompained by: Family member  wife   Pertinent History Parkinson's disease since 2008; deep brain stimulation R side (  L brain) 2014, L side (R brain)April 2017   Patient Stated Goals Pt's goal for therapy is to improve strength, feel more comfortable that I won't fall when I'm walking.   Currently in Pain? No/denies       Scifit level 2.0 all 4 extremities with rpm 70-110 with bouts of increased intensity.  Discussed amplitude of movements as well as working out at intensity level of 7/10.  Treatment session focused on education of Fall Prevention Strategies, [employer], [employer], Use of Recumbent bike at [facility], Use of walking track at [facility], Use of Metronome for freezing and with walking program.  Wife asking appropriate questions concerning fall prevention and accommodations in their home environment.  Reports most of pts falls occuring at night and pt with impulsiveness at night and drop in BP.         PT Education - 08/21/15 0944    Education provided Yes   Education Details Fall prevention strategies, use of metronome, Intensity with recumbent bike, optimal community fitness, Power over [employer]) Educated Patient;Spouse   Methods Explanation;Demonstration;Handout   Comprehension Verbalized understanding;Returned demonstration          PT Short Term Goals - 08/16/15 1105    PT SHORT TERM GOAL #1   Title Pt will perform HEP for improved balance, transfers, gait and posture/decreased pain.  TARGET 08/15/15   Time 4   Period Weeks   Status Achieved   PT SHORT TERM GOAL #2   Title Pt will improve 5x sit<>stand to less than or equal to 16 seconds for improved transfer efficiency and safety.   Time 4   Period Weeks   Status Not Met   PT SHORT TERM GOAL #3   Title Pt will improve TUG score to less than or equal to 13.5 seconds for decreased fall risk.   Status Not Met   PT SHORT TERM GOAL #4   Title Pt will improve Functional Gait Assessment to at least 18/30 for decreased fall risk.   Time 4   Period Weeks   Status Achieved   PT SHORT TERM GOAL #5   Title Pt/family will verbalize understanding of local Parkinson's disease resources.   Period Weeks   Status On-going           PT Long Term Goals - 08/16/15 1203    PT LONG TERM GOAL #1   Title Pt will verbalize/demonstrate understanding of tips to reduce freezing with gait and turns.  TARGET 09/16/15   Time 8   Period Weeks   Status On-going   PT LONG TERM GOAL #2   Title Pt will improve 5x sit<>stand to less than or equal to 14 secondsf or improved  efficiency and safety with transfers.   Time 8   Period Weeks   Status On-going   PT LONG TERM GOAL #3   Title Pt will improve TUG manual to less than or equal to 15 seconds for improved dual tasking with gait.   Time 8   Period Weeks   Status On-going   PT LONG TERM GOAL #4   Title PT will improve Fucntional Gait assessment to at least 20/30 for decreased fall risk.   Time 8   Period Weeks   Status On-going   PT LONG TERM GOAL #5   Title Pt will improve gait velocity to at elast 2.62 ft/sec for imrpoved gait efficiency and safety.   Time 8   Period Weeks  Status On-going   Additional Long Term Goals   Additional Long Term Goals Yes   PT LONG TERM GOAL #6   Title Pt will verbalize plans for continued community fitness upon D/C from PT.   Time 8   Period Weeks   Status On-going   PT LONG TERM GOAL #7   Title Pt/family will verbalize understanding of local Parkinson's disease resources.   Time 8   Period Weeks   Status On-going               Plan - 08/21/15 0947    Clinical Impression Statement Pt and wife continue to ask appropriate questions concerning mobility and safety.  Continues with bradykinesia and cues for increased amplitude and intensity.  Continue PT per POC.   Rehab Potential Good   PT Frequency 2x / week   PT Duration 8 weeks  plus eval   PT Treatment/Interventions ADLs/Self Care Home Management;Therapeutic exercise;Therapeutic activities;Functional mobility training;Stair training;Gait training;DME Instruction;Balance training;Neuromuscular re-education;Patient/family education   PT Next Visit Plan Continue dynamic gait and balance activities, turns and weightshifting, variable direction stepping   Consulted and Agree with Plan of Care Patient   Family Member Consulted wife      Patient will benefit from skilled therapeutic intervention in order to improve the following deficits and impairments:  Abnormal gait, Decreased balance, Decreased  mobility, Decreased range of motion, Decreased safety awareness, Decreased strength, Difficulty walking, Postural dysfunction, Pain  Visit Diagnosis: Other symptoms and signs involving the nervous system  Other symptoms and signs involving the musculoskeletal system  Other lack of coordination  Other abnormalities of gait and mobility     Problem List Patient Active Problem List   Diagnosis Date Noted  . Acute pyelonephritis 12/16/2012  . Sepsis ([place]) 12/15/2012  . Parkinson's disease ([place]) 12/15/2012    [name] [name] 08/21/2015, 9:49 AM  [facility] [address] [place], [place], [address] Phone: [phone]   Fax:  [phone]  Name: Umer Harig MRN: [id number] Date of Birth: 1945/03/04    [name] [name], [place] 08/21/2015 9:49 AM Phone: [phone] Fax: [phone]

## 2015-08-21 NOTE — Patient Instructions (Signed)
  Please complete the assigned speech therapy homework and return it to your next session.  

## 2015-08-23 ENCOUNTER — Ambulatory Visit: Payer: Medicare Other

## 2015-08-23 ENCOUNTER — Ambulatory Visit: Payer: Medicare Other | Admitting: Occupational Therapy

## 2015-08-23 ENCOUNTER — Ambulatory Visit: Payer: Medicare Other | Admitting: Physical Therapy

## 2015-08-23 DIAGNOSIS — R29818 Other symptoms and signs involving the nervous system: Secondary | ICD-10-CM

## 2015-08-23 DIAGNOSIS — R471 Dysarthria and anarthria: Secondary | ICD-10-CM | POA: Diagnosis not present

## 2015-08-23 DIAGNOSIS — R29898 Other symptoms and signs involving the musculoskeletal system: Secondary | ICD-10-CM | POA: Diagnosis not present

## 2015-08-23 DIAGNOSIS — R2681 Unsteadiness on feet: Secondary | ICD-10-CM | POA: Diagnosis not present

## 2015-08-23 DIAGNOSIS — R2689 Other abnormalities of gait and mobility: Secondary | ICD-10-CM

## 2015-08-23 DIAGNOSIS — R278 Other lack of coordination: Secondary | ICD-10-CM

## 2015-08-23 NOTE — Therapy (Signed)
[facility] [address] [place], [place], [address] Phone: [phone]   Fax:  [phone]  Physical Therapy Treatment  Patient Details  Name: [name] [name] MRN: [id number] Date of Birth: 05/23/1945 Referring Provider: Maxine Glenn  Encounter Date: 08/23/2015      PT End of Session - 08/23/15 2239    Visit Number 9   Number of Visits 17   Date for PT Re-Evaluation 09/15/15   Authorization Type Medicare/ BCBS-Gcode every 10th visit   PT Start Time 1020   PT Stop Time 1102   PT Time Calculation (min) 42 min   Activity Tolerance Patient tolerated treatment well   Behavior During Therapy [facility] for tasks assessed/performed      Past Medical History  Diagnosis Date  . Parkinson's disease ([place])   . BPH (benign prostatic hypertrophy)   . Peyronie disease   . History of kidney stones   . Left ureteral calculus   . S/P deep brain stimulator placement     11-29-2012  . Lower urinary tract symptoms (LUTS)   . GERD (gastroesophageal reflux disease)   . Wears glasses   . Wears hearing aid     BILATERAL-- INTERMITTANT WEARS  . Renal cyst, left   . Nephrolithiasis   . Fatigue     d/t Parkinson's  . Orthostatic hypotension     d/t Parkinson's disease  . Pneumonia   . Depression   . Constipation     Past Surgical History  Procedure Laterality Date  . Extracorporeal shock wave lithotripsy Left 10-11-2010  &  01-05-2011  . Cysto/  left ureterosocpy/   left ureteral stent placement/  laser bladder stones and extraction  09-25-2010  . Deep brain stimulator placement  11-29-2012    genertor device at left pectoral area--- not activated yet  . Sp perc nephrostomy Left 12-16-2012  . Cystoscopy with retrograde pyelogram, ureteroscopy and stent placement Left 01/06/2013    Procedure: CYSTOSCOPY WITH RETROGRADE PYELOGRAM, URETEROSCOPY AND STENT PLACEMENT;  Surgeon: Hanley Ben, MD;  Location: [place];   Service: Urology;  Laterality: Left;  . Holmium laser application Left 75/02/256    Procedure: HOLMIUM LASER APPLICATION;  Surgeon: Hanley Ben, MD;  Location: [place];  Service: Urology;  Laterality: Left;  . Stone extraction with basket Left 01/06/2013    Procedure: STONE EXTRACTION WITH BASKET;  Surgeon: Hanley Ben, MD;  Location: [facility];  Service: Urology;  Laterality: Left;  . Negative sleep study  2013  per pt  . Cystoscopy with retrograde pyelogram, ureteroscopy and stent placement Left 09/11/2014    Procedure: CYSTOSCOPY WITH LEFT RETROGRADE PYELOGRAM, URETEROSCOPY AND STENT PLACEMENT;  Surgeon: Lowella Bandy, MD;  Location: [facility];  Service: Urology;  Laterality: Left;  . Holmium laser application Left 06/03/7780    Procedure: HOLMIUM LASER APPLICATION;  Surgeon: Lowella Bandy, MD;  Location: [facility];  Service: Urology;  Laterality: Left;  . Colonoscopy    . Cholecystectomy  12/21/2014    Procedure: LAPAROSCOPIC CHOLECYSTECTOMY;  Surgeon: Ralene Ok, MD;  Location: [place];  Service: General;;    There were no vitals filed for this visit.      Subjective Assessment - 08/23/15 0931    Subjective Doing well today.  Have had a pretty good week so far.   Patient is accompained by: Family member   Pertinent History Parkinson's disease since 2008; deep brain stimulation R side (L brain) 2014, L side (R  brain)April 2017   Patient Stated Goals Pt's goal for therapy is to improve strength, feel more comfortable that I won't fall when I'm walking.   Currently in Pain? No/denies                         [facility] Adult PT Treatment/Exercise - 08/23/15 0937    Transfers   Transfers Sit to Stand   Sit to Stand 6: Modified independent (Device/Increase time)   Stand to Sit 6: Modified independent (Device/Increase time)   Number of Reps 10 reps;Other sets (comment)  each from 20", 18", 16" surfaces    Comments Round robin transfer training with 4 chairs, with cues for large side step, large step and turn to sit at each chair, multiple reps and directions.   Ambulation/Gait   Ambulation/Gait Yes   Ambulation/Gait Assistance 7: Independent   Ambulation/Gait Assistance Details Cues for widened BOS during dynamic gait activities   Assistive device None   Gait Comments Gait activities with forward/back/side stepping, with change of directions.  Cues for deliberate large stepping for changes of directions.   Neuro Re-ed    Neuro Re-ed Details  Forward step and weightshift, back step and weigthshift, side step and weightshift x 10 reps each, then variable direction step and weightshift, progressing to calling out colors while stepping for increased complexity of dual task.  Squatting activities to pick up objects, with cues for wide BOS and technique of squat.  Practiced functional game of corn hole, which patient plays with grandson, incorporating step and weightshift as well as squatting to pick up bean bags.  Pt requires cues for widened BOS.                PT Education - 08/23/15 2238    Education provided Yes   Education Details HEP exercise chart for priority of exercises to incorporate all exercises regularly.   Person(s) Educated Patient;Spouse   Methods Explanation;Handout   Comprehension Verbalized understanding          PT Short Term Goals - 08/16/15 1105    PT SHORT TERM GOAL #1   Title Pt will perform HEP for improved balance, transfers, gait and posture/decreased pain.  TARGET 08/15/15   Time 4   Period Weeks   Status Achieved   PT SHORT TERM GOAL #2   Title Pt will improve 5x sit<>stand to less than or equal to 16 seconds for improved transfer efficiency and safety.   Time 4   Period Weeks   Status Not Met   PT SHORT TERM GOAL #3   Title Pt will improve TUG score to less than or equal to 13.5 seconds for decreased fall risk.   Status Not Met   PT SHORT TERM GOAL  #4   Title Pt will improve Functional Gait Assessment to at least 18/30 for decreased fall risk.   Time 4   Period Weeks   Status Achieved   PT SHORT TERM GOAL #5   Title Pt/family will verbalize understanding of local Parkinson's disease resources.   Period Weeks   Status On-going           PT Long Term Goals - 08/16/15 1203    PT LONG TERM GOAL #1   Title Pt will verbalize/demonstrate understanding of tips to reduce freezing with gait and turns.  TARGET 09/16/15   Time 8   Period Weeks   Status On-going   PT LONG TERM GOAL #2   Title  Pt will improve 5x sit<>stand to less than or equal to 14 secondsf or improved efficiency and safety with transfers.   Time 8   Period Weeks   Status On-going   PT LONG TERM GOAL #3   Title Pt will improve TUG manual to less than or equal to 15 seconds for improved dual tasking with gait.   Time 8   Period Weeks   Status On-going   PT LONG TERM GOAL #4   Title PT will improve Fucntional Gait assessment to at least 20/30 for decreased fall risk.   Time 8   Period Weeks   Status On-going   PT LONG TERM GOAL #5   Title Pt will improve gait velocity to at elast 2.62 ft/sec for imrpoved gait efficiency and safety.   Time 8   Period Weeks   Status On-going   Additional Long Term Goals   Additional Long Term Goals Yes   PT LONG TERM GOAL #6   Title Pt will verbalize plans for continued community fitness upon D/C from PT.   Time 8   Period Weeks   Status On-going   PT LONG TERM GOAL #7   Title Pt/family will verbalize understanding of local Parkinson's disease resources.   Time 8   Period Weeks   Status On-going               Plan - 08/23/15 2240    Clinical Impression Statement Focused treatment session on change of directions with gait and turns today, with progression of dual tasking with cognitive tasks.  Pt responds well to verbal cues for technique, but does tend to have narrow BOS at times with turns and changes of  direction.   Rehab Potential Good   PT Frequency 2x / week   PT Duration 8 weeks  plus eval   PT Treatment/Interventions ADLs/Self Care Home Management;Therapeutic exercise;Therapeutic activities;Functional mobility training;Stair training;Gait training;DME Instruction;Balance training;Neuromuscular re-education;Patient/family education   PT Next Visit Plan Continue dynamic gait and balance activities, turns and weightshifting, variable direction stepping' GCODE NEXT VISIT   Consulted and Agree with Plan of Care Patient   Family Member Consulted wife      Patient will benefit from skilled therapeutic intervention in order to improve the following deficits and impairments:  Abnormal gait, Decreased balance, Decreased mobility, Decreased range of motion, Decreased safety awareness, Decreased strength, Difficulty walking, Postural dysfunction, Pain  Visit Diagnosis: Other abnormalities of gait and mobility  Unsteadiness on feet     Problem List Patient Active Problem List   Diagnosis Date Noted  . Acute pyelonephritis 12/16/2012  . Sepsis ([place]) 12/15/2012  . Parkinson's disease ([place]) 12/15/2012    Nohlan Burdin W. 08/23/2015, 10:44 PM  Frazier Butt., PT  [facility] [address] [place], [place], [address] Phone: [phone]   Fax:  [phone]  Name: [name] [name] MRN: [id number] Date of Birth: 1945/12/30

## 2015-08-23 NOTE — Therapy (Signed)
[facility] [address] [place], [place], [address] Phone: [phone]   Fax:  [phone]  Occupational Therapy Treatment  Patient Details  Name: Khaliq Burggraf MRN: [id number] Date of Birth: [birth year] Referring Provider: Dr. Maxine Glenn  Encounter Date: 08/23/2015    Past Medical History  Diagnosis Date  . Parkinson's disease ([place])   . BPH (benign prostatic hypertrophy)   . Peyronie disease   . History of kidney stones   . Left ureteral calculus   . S/P deep brain stimulator placement     11-29-2012  . Lower urinary tract symptoms (LUTS)   . GERD (gastroesophageal reflux disease)   . Wears glasses   . Wears hearing aid     BILATERAL-- INTERMITTANT WEARS  . Renal cyst, left   . Nephrolithiasis   . Fatigue     d/t Parkinson's  . Orthostatic hypotension     d/t Parkinson's disease  . Pneumonia   . Depression   . Constipation     Past Surgical History  Procedure Laterality Date  . Extracorporeal shock wave lithotripsy Left 10-11-2010  &  01-05-2011  . Cysto/  left ureterosocpy/   left ureteral stent placement/  laser bladder stones and extraction  09-25-2010  . Deep brain stimulator placement  11-29-2012    genertor device at left pectoral area--- not activated yet  . Sp perc nephrostomy Left 12-16-2012  . Cystoscopy with retrograde pyelogram, ureteroscopy and stent placement Left 01/06/2013    Procedure: CYSTOSCOPY WITH RETROGRADE PYELOGRAM, URETEROSCOPY AND STENT PLACEMENT;  Surgeon: Hanley Ben, MD;  Location: [facility];  Service: Urology;  Laterality: Left;  . Holmium laser application Left [id number]    Procedure: HOLMIUM LASER APPLICATION;  Surgeon: Hanley Ben, MD;  Location: [facility];  Service: Urology;  Laterality: Left;  . Stone extraction with basket Left 01/06/2013    Procedure: STONE EXTRACTION WITH BASKET;  Surgeon: Hanley Ben, MD;  Location: [facility];  Service: Urology;  Laterality: Left;  . Negative sleep study  2013  per pt  . Cystoscopy with retrograde pyelogram, ureteroscopy and stent placement Left 09/11/2014    Procedure: CYSTOSCOPY WITH LEFT RETROGRADE PYELOGRAM, URETEROSCOPY AND STENT PLACEMENT;  Surgeon: Lowella Bandy, MD;  Location: [facility];  Service: Urology;  Laterality: Left;  . Holmium laser application Left [id number]    Procedure: HOLMIUM LASER APPLICATION;  Surgeon: Lowella Bandy, MD;  Location: [facility];  Service: Urology;  Laterality: Left;  . Colonoscopy    . Cholecystectomy  12/21/2014    Procedure: LAPAROSCOPIC CHOLECYSTECTOMY;  Surgeon: Ralene Ok, MD;  Location: [place];  Service: General;;    There were no vitals filed for this visit.        Arm bike x 6 mins level 1 for conditioning. Pt maintained 40RPM. Education provided regarding strategies to prevent freezing, pt / wife verbalize understanding. Therapist checked short term goals               PWR [facility]) - 08/23/15 1253    PWR! exercises Moves in [place]! Up 20   PWR! Rock 20   PWR! Twist 10   PWR! Step 20   Comments modified quadraped, min v.c/ demonstration.               OT Short Term Goals - 08/23/15 1030    OT SHORT TERM GOAL #1   Title I with PD specific HEP   Time  4   Period Weeks   Status Achieved   OT SHORT TERM GOAL #2   Title Pt will verbalize understanding of adapted strategies for ADLs/IADLs, including ways to minimize freezing episodes.   Time 4   Period Weeks   Status On-going  needs reinforcement, provided tips for freezing and turning   OT SHORT TERM GOAL #3   Title Pt will demonstrate improved ease with feeding as evidenced by decreasing PPT#2 to 11 secs or less.   Time 4   Period Weeks   Status On-going  17.60 secs, 13.69 on 08/23/15   OT SHORT TERM GOAL #4   Title Pt will demonstrate ability to write a paragraph with 100% legibility, and no  significant decrease in letter size.   Time 4   Period Weeks   Status On-going  min decrease in letter size           OT Long Term Goals - 07/17/15 0930    OT LONG TERM GOAL #1   Title Pt will verbalize understanding of ways to prevent future PD related complications and verbalize understanding of community resources.   Time 8   Period Weeks   Status New   OT LONG TERM GOAL #2   Title Pt will demonstrate increased ease with dressing as evidenced by decreasing 3 button/ unbutton to 35 secs or less   Baseline 39.06 secs.   Time 8   Period Weeks   Status New   OT LONG TERM GOAL #3   Title Pt will verbalize understanding of compenstaory strategies for cognitive deficits, and ways to keep thinking skills sharp.   Time 8   Period Weeks   Status New   OT LONG TERM GOAL #4   Title Pt will demonstrate improved bilateral UE functional use as evidenced by increasing bilateral box/ blocks by 4 blocks.   Baseline RUE 38 blocks, LUE 42 blocks   Time 8   Period Weeks   Status New               Plan - 08/23/15 1059    Clinical Impression Statement Pt is progressing towards short term goals with good carryover of large amplitude movements with exercises.    Rehab Potential Good   OT Frequency 2x / week   OT Duration 8 weeks   OT Treatment/Interventions Self-care/ADL training;Therapeutic exercise;Patient/family education;Balance training;Splinting;Manual Therapy;Neuromuscular education;Ultrasound;Therapeutic exercises;Therapeutic activities;DME and/or AE instruction;Parrafin;Cryotherapy;Cognitive remediation/compensation;Visual/perceptual remediation/compensation;Passive range of motion;Contrast Bath;Moist Heat;Fluidtherapy;Energy conservation;Functional Mobility Training   Plan big movements with ADLs handout   OT Home Exercise Plan issued PWR! basic 4 supine, coordination HEP, handwriting strategies, PWR! hands   Consulted and Agree with Plan of Care Patient;Family  member/caregiver   Family Member Consulted wife, son      Patient will benefit from skilled therapeutic intervention in order to improve the following deficits and impairments:  Abnormal gait, Decreased coordination, Decreased range of motion, Difficulty walking, Impaired flexibility, Impaired sensation, Increased edema, Decreased safety awareness, Decreased endurance, Decreased activity tolerance, Decreased knowledge of precautions, Impaired tone, Pain, Impaired UE functional use, Decreased knowledge of use of DME, Decreased balance, Decreased cognition, Decreased mobility, Decreased strength  Visit Diagnosis: Other symptoms and signs involving the nervous system  Other symptoms and signs involving the musculoskeletal system  Other lack of coordination  Other abnormalities of gait and mobility    Problem List Patient Active Problem List   Diagnosis Date Noted  . Acute pyelonephritis 12/16/2012  . Sepsis ([place]) 12/15/2012  . Parkinson's disease ([place])  12/15/2012    Maleeyah Mccaughey 08/23/2015, 12:56 PM Theone Murdoch, OTR/L [phone] Phone: [phone] 12:56 PM 08/23/2015 [facility] [address] [place], [place], [address] Phone: [phone]   Fax:  [phone]  Name: Lemar Lyga MRN: [id number] Date of Birth: [birth year]

## 2015-08-23 NOTE — Therapy (Signed)
[facility] [address] [place], [place], [address] Phone: [phone]   Fax:  [phone]  Speech Language Pathology Treatment  Patient Details  Name: [name] [name] MRN: [id number] Date of Birth: May 01, 1945 No Data Recorded  Encounter Date: 08/23/2015      End of Session - 08/23/15 0959    Visit Number 7   Number of Visits 17   Date for SLP Re-Evaluation 09/27/15   SLP Start Time 0848   SLP Stop Time  0930   SLP Time Calculation (min) 42 min   Activity Tolerance Patient tolerated treatment well      Past Medical History  Diagnosis Date  . Parkinson's disease ([place])   . BPH (benign prostatic hypertrophy)   . Peyronie disease   . History of kidney stones   . Left ureteral calculus   . S/P deep brain stimulator placement     11-29-2012  . Lower urinary tract symptoms (LUTS)   . GERD (gastroesophageal reflux disease)   . Wears glasses   . Wears hearing aid     BILATERAL-- INTERMITTANT WEARS  . Renal cyst, left   . Nephrolithiasis   . Fatigue     d/t Parkinson's  . Orthostatic hypotension     d/t Parkinson's disease  . Pneumonia   . Depression   . Constipation     Past Surgical History  Procedure Laterality Date  . Extracorporeal shock wave lithotripsy Left 10-11-2010  &  01-05-2011  . Cysto/  left ureterosocpy/   left ureteral stent placement/  laser bladder stones and extraction  09-25-2010  . Deep brain stimulator placement  11-29-2012    genertor device at left pectoral area--- not activated yet  . Sp perc nephrostomy Left 12-16-2012  . Cystoscopy with retrograde pyelogram, ureteroscopy and stent placement Left 01/06/2013    Procedure: CYSTOSCOPY WITH RETROGRADE PYELOGRAM, URETEROSCOPY AND STENT PLACEMENT;  Surgeon: Hanley Ben, MD;  Location: [facility];  Service: Urology;  Laterality: Left;  . Holmium laser application Left [id number]    Procedure: HOLMIUM LASER APPLICATION;   Surgeon: Hanley Ben, MD;  Location: [facility];  Service: Urology;  Laterality: Left;  . Stone extraction with basket Left 01/06/2013    Procedure: STONE EXTRACTION WITH BASKET;  Surgeon: Hanley Ben, MD;  [facility];  Service: Urology;  Laterality: Left;  . Negative sleep study  2013  per pt  . Cystoscopy with retrograde pyelogram, ureteroscopy and stent placement Left 09/11/2014    Procedure: CYSTOSCOPY WITH LEFT RETROGRADE PYELOGRAM, URETEROSCOPY AND STENT PLACEMENT;  Surgeon: Lowella Bandy, MD;  Location: [facility];  Service: Urology;  Laterality: Left;  . Holmium laser application Left [id number]    Procedure: HOLMIUM LASER APPLICATION;  Surgeon: Lowella Bandy, MD;  Location: [facility];  Service: Urology;  Laterality: Left;  . Colonoscopy    . Cholecystectomy  12/21/2014    Procedure: LAPAROSCOPIC CHOLECYSTECTOMY;  Surgeon: Ralene Ok, MD;  Location: [facility];  Service: General;;    There were no vitals filed for this visit.             ADULT SLP TREATMENT - 08/23/15 0944    General Information   Behavior/Cognition Alert;Cooperative;Pleasant mood   Treatment Provided   Treatment provided Cognitive-Linquistic   Cognitive-Linquistic Treatment   Treatment focused on Dysarthria   Skilled Treatment 13 minutes self care/home management: SLP spent approx 1/3 of session explaining consistency of practice of motor activity in  Parkinson's Disease (PD) including speech motor activity, and why consistency is necessary. Pt thanked SLP for sharing this information. (speech tx 30 minutes): SLP used loud /a/ to facilitate recalibration of WNL speaking volume in conversation - average was 86dB. In short answer tasks, pt maintained 70dB or above 85% of the time. In conversation (simple) pt maintained 70dB 75% of the time, with self correction noted rarely.   Assessment / Recommendations / Plan   Plan Continue with current  plan of care   Progression Toward Goals   Progression toward goals Progressing toward goals          SLP Education - 08/23/15 0959    Education provided Yes   Education Details need of consistency of practice with motor tasks provided by therapists   Person(s) Educated Patient;Spouse   Methods Explanation;Other (comment)  visual cues (graph)   Comprehension Verbalized understanding          SLP Short Term Goals - 08/23/15 1000    SLP SHORT TERM GOAL #1   Title pt will sustain /a/ average 88dB over 3 sessions   Time 1   Period Weeks   Status On-going   SLP SHORT TERM GOAL #2   Title pt will  maintain 70dB loudness in 19/20 sentence responses   Status Achieved   SLP SHORT TERM GOAL #3   Title pt will tell SLP 3 s/s aspiration PNA with modified independence   Time 1   Period Weeks   Status On-going          SLP Long Term Goals - 08/21/15 1323    SLP LONG TERM GOAL #1   Title pt will sustsin /a/ average 89dB over 4 sessions   Baseline one session 08-21-15   Time 5   Period Weeks   Status On-going   SLP LONG TERM GOAL #2   Title pt will engage in 10 minutes mod complex conversation with average 70dB   Time 5   Period Weeks   Status On-going   SLP LONG TERM GOAL #3   Title pt will report improved vocal quality in 10 minutes mod complex conversation, compared to first session ST   Time 5   Period Weeks   Status On-going          Plan - 08/23/15 1000    Clinical Impression Statement Pt cont'd with mild mod dysarthria c/b imprecision of phonemes, rushes of speech, and mod hoarseness due to Parkinson's disease. When particpating in simple to mod complex setnence tasks, pt requires min cues occasionally for success (at or above 70dB average).  Skilled ST remains needed to improve pt's conversational loudness to WNL level as well as address anomia.   Speech Therapy Frequency 2x / week   Duration --  5 weeks   Treatment/Interventions SLP instruction and  feedback;Compensatory strategies;Internal/external aids;Patient/family education;Functional tasks;Cueing hierarchy   Potential to Achieve Goals Good   Consulted and Agree with Plan of Care Patient      Patient will benefit from skilled therapeutic intervention in order to improve the following deficits and impairments:   Dysarthria and anarthria    Problem List Patient Active Problem List   Diagnosis Date Noted  . Acute pyelonephritis 12/16/2012  . Sepsis ([place]) 12/15/2012  . Parkinson's disease ([place]) 12/15/2012    [facility] ,[place], [place]  08/23/2015, 10:02 AM  [facility] [address] [place], [place], [address] Phone: [phone]   Fax:  [phone]   Name: Tayyab Rapp  MRN: [id number] Date of Birth: Feb 16, 1945

## 2015-08-23 NOTE — Patient Instructions (Signed)
(  Exercise) Monday Tuesday Wednesday Thursday Friday Saturday Sunday   PWR! Moves Sitting          PWR! MovesStanding           PWR! Moves supine           PWR! Moves All Fours-at counter           PWR! Hands           Coordination Ex           Walking           Speech Exercises           Recumbent bike

## 2015-08-26 ENCOUNTER — Ambulatory Visit: Payer: Medicare Other | Admitting: Occupational Therapy

## 2015-08-26 DIAGNOSIS — R41844 Frontal lobe and executive function deficit: Secondary | ICD-10-CM

## 2015-08-26 DIAGNOSIS — R278 Other lack of coordination: Secondary | ICD-10-CM

## 2015-08-26 DIAGNOSIS — H52223 Regular astigmatism, bilateral: Secondary | ICD-10-CM | POA: Diagnosis not present

## 2015-08-26 DIAGNOSIS — H02834 Dermatochalasis of left upper eyelid: Secondary | ICD-10-CM | POA: Diagnosis not present

## 2015-08-26 DIAGNOSIS — R2681 Unsteadiness on feet: Secondary | ICD-10-CM | POA: Diagnosis not present

## 2015-08-26 DIAGNOSIS — H04123 Dry eye syndrome of bilateral lacrimal glands: Secondary | ICD-10-CM | POA: Diagnosis not present

## 2015-08-26 DIAGNOSIS — R29898 Other symptoms and signs involving the musculoskeletal system: Secondary | ICD-10-CM

## 2015-08-26 DIAGNOSIS — H524 Presbyopia: Secondary | ICD-10-CM | POA: Diagnosis not present

## 2015-08-26 DIAGNOSIS — H02831 Dermatochalasis of right upper eyelid: Secondary | ICD-10-CM | POA: Diagnosis not present

## 2015-08-26 DIAGNOSIS — R29818 Other symptoms and signs involving the nervous system: Secondary | ICD-10-CM | POA: Diagnosis not present

## 2015-08-26 DIAGNOSIS — H2513 Age-related nuclear cataract, bilateral: Secondary | ICD-10-CM | POA: Diagnosis not present

## 2015-08-26 DIAGNOSIS — R2689 Other abnormalities of gait and mobility: Secondary | ICD-10-CM | POA: Diagnosis not present

## 2015-08-26 DIAGNOSIS — R471 Dysarthria and anarthria: Secondary | ICD-10-CM | POA: Diagnosis not present

## 2015-08-26 NOTE — Therapy (Signed)
[facility] [address] [place], [place], [address] Phone: [phone]   Fax:  [phone]  Occupational Therapy Treatment  Patient Details  Name: [name] [name] MRN: [id number] Date of Birth: Dec 23, 1945 Referring Provider: Dr. Maxine Glenn  Encounter Date: 08/26/2015    Past Medical History:  Diagnosis Date  . BPH (benign prostatic hypertrophy)   . Constipation   . Depression   . Fatigue    d/t Parkinson's  . GERD (gastroesophageal reflux disease)   . History of kidney stones   . Left ureteral calculus   . Lower urinary tract symptoms (LUTS)   . Nephrolithiasis   . Orthostatic hypotension    d/t Parkinson's disease  . Parkinson's disease ([place])   . Peyronie disease   . Pneumonia   . Renal cyst, left   . S/P deep brain stimulator placement    11-29-2012  . Wears glasses   . Wears hearing aid    BILATERAL-- INTERMITTANT WEARS    Past Surgical History:  Procedure Laterality Date  . CHOLECYSTECTOMY  12/21/2014   Procedure: LAPAROSCOPIC CHOLECYSTECTOMY;  Surgeon: Ralene Ok, MD;  Location: [facility];  Service: General;;  . COLONOSCOPY    . CYSTO/  LEFT URETEROSOCPY/   LEFT URETERAL STENT PLACEMENT/  LASER BLADDER STONES AND EXTRACTION  09-25-2010  . CYSTOSCOPY WITH RETROGRADE PYELOGRAM, URETEROSCOPY AND STENT PLACEMENT Left 01/06/2013   Procedure: CYSTOSCOPY WITH RETROGRADE PYELOGRAM, URETEROSCOPY AND STENT PLACEMENT;  Surgeon: Hanley Ben, MD;  Location: [facility];  Service: Urology;  Laterality: Left;  . CYSTOSCOPY WITH RETROGRADE PYELOGRAM, URETEROSCOPY AND STENT PLACEMENT Left 09/11/2014   Procedure: CYSTOSCOPY WITH LEFT RETROGRADE PYELOGRAM, URETEROSCOPY AND STENT PLACEMENT;  Surgeon: Lowella Bandy, MD;  Location: [facility];  Service: Urology;  Laterality: Left;  . DEEP BRAIN STIMULATOR PLACEMENT  11-29-2012   genertor device at left pectoral area--- not activated yet  .  EXTRACORPOREAL SHOCK WAVE LITHOTRIPSY Left 10-11-2010  &  01-05-2011  . HOLMIUM LASER APPLICATION Left [id number]   Procedure: HOLMIUM LASER APPLICATION;  Surgeon: Hanley Ben, MD;  Location: [facility];  Service: Urology;  Laterality: Left;  . HOLMIUM LASER APPLICATION Left [id number]   Procedure: HOLMIUM LASER APPLICATION;  Surgeon: Lowella Bandy, MD;  Location: [facility];  Service: Urology;  Laterality: Left;  . NEGATIVE SLEEP STUDY  2013  per pt  . SP PERC NEPHROSTOMY Left 12-16-2012  . STONE EXTRACTION WITH BASKET Left 01/06/2013   Procedure: STONE EXTRACTION WITH BASKET;  Surgeon: Hanley Ben, MD;  Location: [facility];  Service: Urology;  Laterality: Left;    There were no vitals filed for this visit.      Subjective Assessment - 08/26/15 1106    Subjective  neck pain   Pertinent History see EPic, bilateral DBS   Patient Stated Goals improve speed with ADLs, improve balance   Currently in Pain? Yes   Pain Score 3    Pain Location Neck   Pain Orientation Left   Pain Descriptors / Indicators Aching   Pain Type Chronic pain   Pain Onset More than a month ago   Pain Frequency Intermittent   Aggravating Factors  malpositioning   Pain Relieving Factors repositioning   Multiple Pain Sites No            PWR! Rock from modified quadraped x 10 reps, min v.c. Fine motor coordination task placing grooved pegs in bilateral UE's then removing with PWR! Step, min difficulty/ v.c.  PWR! Hands prior to task. Pt practiced donning doffing jacket multiple times, using various strategies, mod v.c./ demonstration initally, then pt returned demonstration.(Pt does best holding jacket to bring behind head and neck)                OT Education - 08/26/15 1426    Education provided Yes   Education Details big movments with ADLs/ donning doffing jacket with adapted strategy   Person(s) Educated Patient;Spouse   Methods  Explanation;Demonstration;Verbal cues;Handout   Comprehension Verbalized understanding;Returned demonstration;Verbal cues required          OT Short Term Goals - 08/23/15 1030      OT SHORT TERM GOAL #1   Title I with PD specific HEP   Time 4   Period Weeks   Status Achieved     OT SHORT TERM GOAL #2   Title Pt will verbalize understanding of adapted strategies for ADLs/IADLs, including ways to minimize freezing episodes.   Time 4   Period Weeks   Status On-going  needs reinforcement, provided tips for freezing and turning     OT SHORT TERM GOAL #3   Title Pt will demonstrate improved ease with feeding as evidenced by decreasing PPT#2 to 11 secs or less.   Time 4   Period Weeks   Status On-going  17.60 secs, 13.69 on 08/23/15     OT SHORT TERM GOAL #4   Title Pt will demonstrate ability to write a paragraph with 100% legibility, and no significant decrease in letter size.   Time 4   Period Weeks   Status On-going  min decrease in letter size           OT Long Term Goals - 07/17/15 0930      OT LONG TERM GOAL #1   Title Pt will verbalize understanding of ways to prevent future PD related complications and verbalize understanding of community resources.   Time 8   Period Weeks   Status New     OT LONG TERM GOAL #2   Title Pt will demonstrate increased ease with dressing as evidenced by decreasing 3 button/ unbutton to 35 secs or less   Baseline 39.06 secs.   Time 8   Period Weeks   Status New     OT LONG TERM GOAL #3   Title Pt will verbalize understanding of compenstaory strategies for cognitive deficits, and ways to keep thinking skills sharp.   Time 8   Period Weeks   Status New     OT LONG TERM GOAL #4   Title Pt will demonstrate improved bilateral UE functional use as evidenced by increasing bilateral box/ blocks by 4 blocks.   Baseline RUE 38 blocks, LUE 42 blocks   Time 8   Period Weeks   Status New               Plan - 08/26/15 1123     Clinical Impression Statement Pt is progressing towards goals. He verbalizes understanding of HEP.   Rehab Potential Good   OT Frequency 2x / week   OT Duration 8 weeks   OT Treatment/Interventions Self-care/ADL training;Therapeutic exercise;Patient/family education;Balance training;Splinting;Manual Therapy;Neuromuscular education;Ultrasound;Therapeutic exercises;Therapeutic activities;DME and/or AE instruction;Parrafin;Cryotherapy;Cognitive remediation/compensation;Visual/perceptual remediation/compensation;Passive range of motion;Contrast Bath;Moist Heat;Fluidtherapy;Energy conservation;Functional Mobility Training   Plan continue to reinforce big movments with ADLs, practice fasten buttons with PWR! hands, reveiw donning/ doffing jacket, cognitive strategies   OT Home Exercise Plan issued PWR! basic 4 supine, coordination HEP, handwriting strategies, PWR! hands  Consulted and Agree with Plan of Care Patient      Patient will benefit from skilled therapeutic intervention in order to improve the following deficits and impairments:  Abnormal gait, Decreased coordination, Decreased range of motion, Difficulty walking, Impaired flexibility, Impaired sensation, Increased edema, Decreased safety awareness, Decreased endurance, Decreased activity tolerance, Decreased knowledge of precautions, Impaired tone, Pain, Impaired UE functional use, Decreased knowledge of use of DME, Decreased balance, Decreased cognition, Decreased mobility, Decreased strength  Visit Diagnosis: Other symptoms and signs involving the nervous system  Other symptoms and signs involving the musculoskeletal system  Other lack of coordination  Frontal lobe and executive function deficit    Problem List Patient Active Problem List   Diagnosis Date Noted  . Acute pyelonephritis 12/16/2012  . Sepsis ([place]) 12/15/2012  . Parkinson's disease ([place]) 12/15/2012    Aviendha Azbell 08/26/2015, 2:27 PM  [facility] [address] [place], [place], [address] Phone: [phone]   Fax:  [phone]  Name: [name] [name] MRN: [id number] Date of Birth: 1945/04/18

## 2015-08-26 NOTE — Patient Instructions (Addendum)
Performing Daily Activities with Big Movements  Pick at least one activity a day and perform with BIG, DELIBERATE movements/effort. This can make the activity easier and turn daily activities into exercise!  If you are standing during the activity, make sure to keep feet apart and stand with good/big/PWR! UP posture.  Examples:  Dressing - Push arms in sleeves, twist when putting on jacket, push foot into pants, open hands to pull down shirt/put on socks/pull up pants  Bathing - Wash/dry with long strokes  Brushing your teeth - Big, slow movements  Cutting food - Long deliberate cuts  Opening jar/bottle - Move as much as you can with each turn  Picking up a cup/bottle - Open hand up big and get object all the way in palm  Hanging up clothes/getting clothes down from closet - Reach with big effort  Putting away groceries/dishes - Reach with big effort  Wiping counter/table - Move in big, long strokes  Stirring while cooking - Exaggerate movement  Cleaning windows - Move in big, long strokes  Sweeping - Move arms in big, long strokes  Vacuuming - Push with big movement  Folding clothes - Exaggerate arm movements  Washing car - Move in big, long strokes  Raking - Move arms in big, long strokes  Changing light bulb - Move as much as you can with each turn  Using a screwdriver - Move as much as you can with each turn  Walking into a store/restaurant - Walk with big steps, swing arms if able  Standing up from a chair/recliner/sofa - Scoot forward, lean forward, and stand with big effort                     When donning a jacket hold it facing you With left hand to the right side of label, put right arm into jacket while continuing to hold the jacket at the neck to bring it behind your neck then put in left arm When removing take off one arm at a time

## 2015-08-27 ENCOUNTER — Ambulatory Visit: Payer: Medicare Other | Admitting: Physical Therapy

## 2015-08-27 ENCOUNTER — Encounter: Payer: Medicare Other | Admitting: Speech Pathology

## 2015-08-27 ENCOUNTER — Encounter: Payer: Medicare Other | Admitting: Occupational Therapy

## 2015-08-28 ENCOUNTER — Ambulatory Visit: Payer: Medicare Other | Admitting: Physical Therapy

## 2015-08-28 ENCOUNTER — Ambulatory Visit: Payer: Medicare Other | Admitting: Occupational Therapy

## 2015-08-28 ENCOUNTER — Ambulatory Visit: Payer: Medicare Other

## 2015-08-28 DIAGNOSIS — R2689 Other abnormalities of gait and mobility: Secondary | ICD-10-CM | POA: Diagnosis not present

## 2015-08-28 DIAGNOSIS — R29818 Other symptoms and signs involving the nervous system: Secondary | ICD-10-CM

## 2015-08-28 DIAGNOSIS — R471 Dysarthria and anarthria: Secondary | ICD-10-CM

## 2015-08-28 DIAGNOSIS — R2681 Unsteadiness on feet: Secondary | ICD-10-CM

## 2015-08-28 DIAGNOSIS — R29898 Other symptoms and signs involving the musculoskeletal system: Secondary | ICD-10-CM

## 2015-08-28 DIAGNOSIS — R278 Other lack of coordination: Secondary | ICD-10-CM | POA: Diagnosis not present

## 2015-08-28 NOTE — Patient Instructions (Signed)
  Please complete the assigned speech therapy homework and return it to your next session.  

## 2015-08-28 NOTE — Patient Instructions (Signed)
Memory Compensation Strategies  1. Use "WARM" strategy.  W= write it down  A= associate it  R= repeat it  M= make a mental note  2.   You can keep a Memory Notebook.  Use a 3-ring notebook with sections for the following: calendar, important names and phone numbers,  medications, doctors' names/phone numbers, lists/reminders, and a section to journal what you did  each day.   3.    Use a calendar to write appointments down.  4.    Write yourself a schedule for the day.  This can be placed on the calendar or in a separate section of the Memory Notebook.  Keeping a  regular schedule can help memory.  5.    Use medication organizer with sections for each day or morning/evening pills.  You may need help loading it  6.    Keep a basket, or pegboard by the door.  Place items that you need to take out with you in the basket or on the pegboard.  You may also want to  include a message board for reminders.  7.    Use sticky notes.  Place sticky notes with reminders in a place where the task is performed.  For example: " turn off the  stove" placed by the stove, "lock the door" placed on the door at eye level, " take your medications" on  the bathroom mirror or by the place where you normally take your medications.  8.    Use alarms/timers.  Use while cooking to remind yourself to check on food or as a reminder to take your medicine, or as a  reminder to make a call, or as a reminder to perform another task, etc.    Keeping Thinking Skills Sharp: 1. Jigsaw puzzles 2. Card/board games 3. Talking on the phone/social events 4. Lum[employer].com 5. Online games 6. Word serches/crossword puzzles 7.  Logic puzzles 8. Aerobic exercise (stationary bike) 9. Eating balanced diet (fruits & veggies) 10. Drink water 11. Try something new--new recipe, hobby 12. Crafts 13. Do a variety of activities that are challenging 14. Add cognitive activities to walking/exercising (think of animal/food/city with  each letter of the alphabet, counting backwards, thinking of as many vegetables as you can, etc.).--Only do this  If safe (no freezing/falls).   

## 2015-08-28 NOTE — Therapy (Signed)
[facility] [address] [place], [place], [address] Phone: [phone]   Fax:  [phone]  Occupational Therapy Treatment  Patient Details  Name: [name] [name] MRN: [id number] Date of Birth: 17-Jul-1945 Referring Provider: Dr. Maxine Glenn  Encounter Date: 08/28/2015      OT End of Session - 08/28/15 1037    Visit Number 7   Number of Visits 17   Date for OT Re-Evaluation 09/14/15   Authorization Type Medicare   Authorization - Visit Number 7   Authorization - Number of Visits 10   OT Start Time 0935   OT Stop Time 1017   OT Time Calculation (min) 42 min      Past Medical History:  Diagnosis Date  . BPH (benign prostatic hypertrophy)   . Constipation   . Depression   . Fatigue    d/t Parkinson's  . GERD (gastroesophageal reflux disease)   . History of kidney stones   . Left ureteral calculus   . Lower urinary tract symptoms (LUTS)   . Nephrolithiasis   . Orthostatic hypotension    d/t Parkinson's disease  . Parkinson's disease ([place])   . Peyronie disease   . Pneumonia   . Renal cyst, left   . S/P deep brain stimulator placement    11-29-2012  . Wears glasses   . Wears hearing aid    BILATERAL-- INTERMITTANT WEARS    Past Surgical History:  Procedure Laterality Date  . CHOLECYSTECTOMY  12/21/2014   Procedure: LAPAROSCOPIC CHOLECYSTECTOMY;  Surgeon: Ralene Ok, MD;  Location: [facility];  Service: General;;  . COLONOSCOPY    . CYSTO/  LEFT URETEROSOCPY/   LEFT URETERAL STENT PLACEMENT/  LASER BLADDER STONES AND EXTRACTION  09-25-2010  . CYSTOSCOPY WITH RETROGRADE PYELOGRAM, URETEROSCOPY AND STENT PLACEMENT Left 01/06/2013   Procedure: CYSTOSCOPY WITH RETROGRADE PYELOGRAM, URETEROSCOPY AND STENT PLACEMENT;  Surgeon: Hanley Ben, MD;  Location: [facility];  Service: Urology;  Laterality: Left;  . CYSTOSCOPY WITH RETROGRADE PYELOGRAM, URETEROSCOPY AND STENT PLACEMENT Left 09/11/2014   Procedure: CYSTOSCOPY WITH LEFT RETROGRADE PYELOGRAM, URETEROSCOPY AND STENT PLACEMENT;  Surgeon: Lowella Bandy, MD;  Location: [facility];  Service: Urology;  Laterality: Left;  . DEEP BRAIN STIMULATOR PLACEMENT  11-29-2012   genertor device at left pectoral area--- not activated yet  . EXTRACORPOREAL SHOCK WAVE LITHOTRIPSY Left 10-11-2010  &  01-05-2011  . HOLMIUM LASER APPLICATION Left [id number]   Procedure: HOLMIUM LASER APPLICATION;  Surgeon: Hanley Ben, MD;  Location: [facility];  Service: Urology;  Laterality: Left;  . HOLMIUM LASER APPLICATION Left [id number]   Procedure: HOLMIUM LASER APPLICATION;  Surgeon: Lowella Bandy, MD;  Location: [facility];  Service: Urology;  Laterality: Left;  . NEGATIVE SLEEP STUDY  2013  per pt  . SP PERC NEPHROSTOMY Left 12-16-2012  . STONE EXTRACTION WITH BASKET Left 01/06/2013   Procedure: STONE EXTRACTION WITH BASKET;  Surgeon: Hanley Ben, MD;  Location: [facility];  Service: Urology;  Laterality: Left;    There were no vitals filed for this visit.      Subjective Assessment - 08/28/15 1027    Subjective  Pt reports mild neck pain   Pertinent History see EPic, bilateral DBS   Patient Stated Goals improve speed with ADLs, improve balance   Currently in Pain? Yes   Pain Score 3    Pain Location Neck   Pain Orientation Left   Pain Descriptors / Indicators Aching  Pain Type Chronic pain   Pain Onset More than a month ago   Pain Frequency Intermittent   Aggravating Factors  malpositioning   Pain Relieving Factors repositioning   Multiple Pain Sites No       Therapist reviewed donning doffing jacket with adapated strategy, pt returned demonstration x 3 with good technique. Simulated ADLS for tucking in shirt and drying back with bag exercises, min v.c./ demonstration. Ambulating and carrying a plate of objects to simulate carrying food, Pt demonstrates ability to carry without  spills if he keeps his arm and plate if close to his body. Fastening buttons with adapted strategy, PWR! Hands and flicks before fastening, min v.c./ demonstration. Pt returned demonstration.                   [facility]) - 08/28/15 1038    PWR! exercises Moves in supine   PWR! Up 20   PWR! Rock 20   PWR! Twist 20   PWR! Step 10   Comments min v.c. for intensity and larger movements             OT Education - 08/28/15 1049    Education provided Yes   Education Details adapated strategies for ADLS, keeping thinking skills sharp/ memory compensations   Person(s) Educated Patient   Methods Explanation;Demonstration;Verbal cues;Handout   Comprehension Verbalized understanding;Returned demonstration;Verbal cues required          OT Short Term Goals - 08/23/15 1030      OT SHORT TERM GOAL #1   Title I with PD specific HEP   Time 4   Period Weeks   Status Achieved     OT SHORT TERM GOAL #2   Title Pt will verbalize understanding of adapted strategies for ADLs/IADLs, including ways to minimize freezing episodes.   Time 4   Period Weeks   Status On-going  needs reinforcement, provided tips for freezing and turning     OT SHORT TERM GOAL #3   Title Pt will demonstrate improved ease with feeding as evidenced by decreasing PPT#2 to 11 secs or less.   Time 4   Period Weeks   Status On-going  17.60 secs, 13.69 on 08/23/15     OT SHORT TERM GOAL #4   Title Pt will demonstrate ability to write a paragraph with 100% legibility, and no significant decrease in letter size.   Time 4   Period Weeks   Status On-going  min decrease in letter size           OT Long Term Goals - 08/28/15 1037      OT LONG TERM GOAL #1   Title Pt will verbalize understanding of ways to prevent future PD related complications and verbalize understanding of community resources.   Time 8   Period Weeks   Status On-going     OT LONG TERM GOAL #2   Title Pt will demonstrate  increased ease with dressing as evidenced by decreasing 3 button/ unbutton to 35 secs or less   Baseline 39.06 secs.   Time 8   Period Weeks   Status On-going     OT LONG TERM GOAL #3   Title Pt will verbalize understanding of compenstaory strategies for cognitive deficits, and ways to keep thinking skills sharp.   Time 8   Period Weeks   Status Achieved     OT LONG TERM GOAL #4   Title Pt will demonstrate improved bilateral UE functional use as evidenced by increasing  bilateral box/ blocks by 4 blocks.   Baseline RUE 38 blocks, LUE 42 blocks   Time 8   Period Weeks   Status On-going               Plan - 08/28/15 1030    Clinical Impression Statement Pt is progressing towards goals. He demonstrates good acarrypver of adapted strategy for donning/ doofing jacket.   Rehab Potential Good   OT Frequency 2x / week   OT Duration 8 weeks   OT Treatment/Interventions Self-care/ADL training;Therapeutic exercise;Patient/family education;Balance training;Splinting;Manual Therapy;Neuromuscular education;Ultrasound;Therapeutic exercises;Therapeutic activities;DME and/or AE instruction;Parrafin;Cryotherapy;Cognitive remediation/compensation;Visual/perceptual remediation/compensation;Passive range of motion;Contrast Bath;Moist Heat;Fluidtherapy;Energy conservation;Functional Mobility Training   Plan reinforce big movements with ADLS,check goals anticipate d/c next week   OT Home Exercise Plan issued PWR! basic 4 supine, coordination HEP, handwriting strategies, PWR! hands, memory strategies/ keeping thinking skills sharp      Patient will benefit from skilled therapeutic intervention in order to improve the following deficits and impairments:  Abnormal gait, Decreased coordination, Decreased range of motion, Difficulty walking, Impaired flexibility, Impaired sensation, Increased edema, Decreased safety awareness, Decreased endurance, Decreased activity tolerance, Decreased knowledge of  precautions, Impaired tone, Pain, Impaired UE functional use, Decreased knowledge of use of DME, Decreased balance, Decreased cognition, Decreased mobility, Decreased strength  Visit Diagnosis: Other symptoms and signs involving the nervous system  Other symptoms and signs involving the musculoskeletal system  Other lack of coordination    Problem List Patient Active Problem List   Diagnosis Date Noted  . Acute pyelonephritis 12/16/2012  . Sepsis ([place]) 12/15/2012  . Parkinson's disease ([place]) 12/15/2012    RINE,KATHRYN 08/28/2015, 10:50 AM  [facility] [address] [place], [place], [address] Phone: [phone]   Fax:  [phone]  Name: [name] [name] MRN: [id number] Date of Birth: 12/11/1945

## 2015-08-28 NOTE — Therapy (Signed)
[facility] [address] [place], [place], [address] Phone: [phone]   Fax:  [phone]  Speech Language Pathology Treatment  Patient Details  Name: [name] [name] MRN: [id number] Date of Birth: March 10, 1945 No Data Recorded  Encounter Date: 08/28/2015      End of Session - 08/28/15 1221    Visit Number 8   Number of Visits 17   Date for SLP Re-Evaluation 09/27/15   SLP Start Time 1018   SLP Stop Time  1100   SLP Time Calculation (min) 42 min   Activity Tolerance Patient tolerated treatment well      Past Medical History:  Diagnosis Date  . BPH (benign prostatic hypertrophy)   . Constipation   . Depression   . Fatigue    d/t Parkinson's  . GERD (gastroesophageal reflux disease)   . History of kidney stones   . Left ureteral calculus   . Lower urinary tract symptoms (LUTS)   . Nephrolithiasis   . Orthostatic hypotension    d/t Parkinson's disease  . Parkinson's disease ([place])   . Peyronie disease   . Pneumonia   . Renal cyst, left   . S/P deep brain stimulator placement    11-29-2012  . Wears glasses   . Wears hearing aid    BILATERAL-- INTERMITTANT WEARS    Past Surgical History:  Procedure Laterality Date  . CHOLECYSTECTOMY  12/21/2014   Procedure: LAPAROSCOPIC CHOLECYSTECTOMY;  Surgeon: Ralene Ok, MD;  Location: [facility];  Service: General;;  . COLONOSCOPY    . CYSTO/  LEFT URETEROSOCPY/   LEFT URETERAL STENT PLACEMENT/  LASER BLADDER STONES AND EXTRACTION  09-25-2010  . CYSTOSCOPY WITH RETROGRADE PYELOGRAM, URETEROSCOPY AND STENT PLACEMENT Left 01/06/2013   Procedure: CYSTOSCOPY WITH RETROGRADE PYELOGRAM, URETEROSCOPY AND STENT PLACEMENT;  Surgeon: Hanley Ben, MD;  Location: [facility];  Service: Urology;  Laterality: Left;  . CYSTOSCOPY WITH RETROGRADE PYELOGRAM, URETEROSCOPY AND STENT PLACEMENT Left 09/11/2014   Procedure: CYSTOSCOPY WITH LEFT RETROGRADE PYELOGRAM,  URETEROSCOPY AND STENT PLACEMENT;  Surgeon: Lowella Bandy, MD;  Location: [facility];  Service: Urology;  Laterality: Left;  . DEEP BRAIN STIMULATOR PLACEMENT  11-29-2012   genertor device at left pectoral area--- not activated yet  . EXTRACORPOREAL SHOCK WAVE LITHOTRIPSY Left 10-11-2010  &  01-05-2011  . HOLMIUM LASER APPLICATION Left 47/0/9628   Procedure: HOLMIUM LASER APPLICATION;  Surgeon: Hanley Ben, MD;  Location: [facility];  Service: Urology;  Laterality: Left;  . HOLMIUM LASER APPLICATION Left 04/07/6292   Procedure: HOLMIUM LASER APPLICATION;  Surgeon: Lowella Bandy, MD;  Location: [facility];  Service: Urology;  Laterality: Left;  . NEGATIVE SLEEP STUDY  2013  per pt  . SP PERC NEPHROSTOMY Left 12-16-2012  . STONE EXTRACTION WITH BASKET Left 01/06/2013   Procedure: STONE EXTRACTION WITH BASKET;  Surgeon: Hanley Ben, MD;  Location: [facility];  Service: Urology;  Laterality: Left;    There were no vitals filed for this visit.      Subjective Assessment - 08/28/15 1026    Subjective Pt used loudness at 70dB when talking to social worker prior to loud /a/, however when in conversation after loud /a/, pt used volume sub-70dB.   Currently in Pain? No/denies               ADULT SLP TREATMENT - 08/28/15 1030      General Information   Behavior/Cognition Alert;Cooperative;Pleasant mood  Treatment Provided   Treatment provided Cognitive-Linquistic     Cognitive-Linquistic Treatment   Treatment focused on Dysarthria   Skilled Treatment Facilitated loud /a/ to recalibrate pt's volume in order to assist pt to improve conversational loudness - average 89dB. In strucutred speech tasks of sentence responses pt maintained 70dB after initial cues for loudness. SLP req'd to provide verbal and visual cues for loudness and effort occasionally in mod complex sentence tasks. SLP faciliatated pt's louder speech in  multitasking situation of walking with pt maintaining intelligible speech 95% of the time for approx 5 minutes of walking in a mod noisy environment. SLP explained the difficulty pt has iwth dual-tasking his focus upon his message and upon the need for loudness and stressed need for loudness has to be made more habitual. Pt with rare carryover of louder speech in non-therapy tasks during therapy session.     Assessment / Recommendations / Plan   Plan Continue with current plan of care     Progression Toward Goals   Progression toward goals Progressing toward goals          SLP Education - 08/28/15 1220    Education provided Yes   Education Details dual-tasking of message content and message loudness difficult at this time for pt   Person(s) Educated Patient   Methods Explanation   Comprehension Verbalized understanding          SLP Short Term Goals - 08/28/15 1223      SLP SHORT TERM GOAL #1   Title pt will sustain /a/ average 88dB over 3 sessions   Status Partially Met     SLP [place] #2   Title pt will  maintain 70dB loudness in 19/20 sentence responses   Status Achieved     SLP SHORT TERM GOAL #3   Title pt will tell SLP 3 s/s aspiration PNA with modified independence   Status Deferred  carry over into LTGs          SLP Long Term Goals - 08/28/15 1223      SLP LONG TERM GOAL #1   Title pt will sustsin /a/ average 89dB over 4 sessions   Baseline two sessions 08-28-15   Time 4   Period Weeks   Status On-going     SLP LONG TERM GOAL #2   Title pt will engage in 10 minutes mod complex conversation with average 70dB and min A rarely for loudness   Time 4   Period Weeks   Status Revised     SLP LONG TERM GOAL #3   Title pt will report improved vocal quality in 10 minutes mod complex conversation, compared to first session ST   Time 4   Period Weeks   Status On-going     SLP LONG TERM GOAL #4   Title pt will tell SLP 3 s/s aspiration PNA with  modified independence   Time 4   Period Weeks   Status New          Plan - 08/28/15 1221    Clinical Impression Statement Pt cont'd with mild mod dysarthria c/b imprecision of phonemes, rushes of speech, and min-mod hoarseness due to Parkinson's disease. Pt cont's to require cues from SLP in simple conversation, but more frequent cueing when linguistic demand increases.  Skilled ST remains needed to improve pt's conversational loudness to WNL level as well as address anomia.   Speech Therapy Frequency 2x / week   Duration 4 weeks   Treatment/Interventions  SLP instruction and feedback;Compensatory strategies;Internal/external aids;Patient/family education;Functional tasks;Cueing hierarchy   Potential to Achieve Goals Good   Consulted and Agree with Plan of Care Patient      Patient will benefit from skilled therapeutic intervention in order to improve the following deficits and impairments:   Dysarthria and anarthria    Problem List Patient Active Problem List   Diagnosis Date Noted  . Acute pyelonephritis 12/16/2012  . Sepsis ([place]) 12/15/2012  . Parkinson's disease ([place]) 12/15/2012    [facility] ,[place], [place]  08/28/2015, 12:25 PM  [facility] [address] [place], [place], [address] Phone: [phone]   Fax:  [phone]   Name: [name] [name] MRN: [id number] Date of Birth: [birth year]

## 2015-08-29 NOTE — Therapy (Signed)
[facility] [address] [place], [place], [address] Phone: [phone]   Fax:  [phone]  Physical Therapy Treatment  Patient Details  Name: [name] [name] MRN: [id number] Date of Birth: 1945-03-02 Referring Provider: Maxine Glenn  Encounter Date: 08/28/2015      PT End of Session - 08/29/15 2033    Visit Number 10   Number of Visits 17   Date for PT Re-Evaluation 09/15/15   Authorization Type Medicare/ BCBS-Gcode every 10th visit   PT Start Time 0857  Pt arrives late   PT Stop Time 0930   PT Time Calculation (min) 33 min   Activity Tolerance Patient tolerated treatment well   Behavior During Therapy [facility] for tasks assessed/performed      Past Medical History:  Diagnosis Date  . BPH (benign prostatic hypertrophy)   . Constipation   . Depression   . Fatigue    d/t Parkinson's  . GERD (gastroesophageal reflux disease)   . History of kidney stones   . Left ureteral calculus   . Lower urinary tract symptoms (LUTS)   . Nephrolithiasis   . Orthostatic hypotension    d/t Parkinson's disease  . Parkinson's disease ([place])   . Peyronie disease   . Pneumonia   . Renal cyst, left   . S/P deep brain stimulator placement    11-29-2012  . Wears glasses   . Wears hearing aid    BILATERAL-- INTERMITTANT WEARS    Past Surgical History:  Procedure Laterality Date  . CHOLECYSTECTOMY  12/21/2014   Procedure: LAPAROSCOPIC CHOLECYSTECTOMY;  Surgeon: Ralene Ok, MD;  Location: [facility];  Service: General;;  . COLONOSCOPY    . CYSTO/  LEFT URETEROSOCPY/   LEFT URETERAL STENT PLACEMENT/  LASER BLADDER STONES AND EXTRACTION  09-25-2010  . CYSTOSCOPY WITH RETROGRADE PYELOGRAM, URETEROSCOPY AND STENT PLACEMENT Left 01/06/2013   Procedure: CYSTOSCOPY WITH RETROGRADE PYELOGRAM, URETEROSCOPY AND STENT PLACEMENT;  Surgeon: Hanley Ben, MD;  Location: [facility];  Service: Urology;  Laterality: Left;  . CYSTOSCOPY  WITH RETROGRADE PYELOGRAM, URETEROSCOPY AND STENT PLACEMENT Left 09/11/2014   Procedure: CYSTOSCOPY WITH LEFT RETROGRADE PYELOGRAM, URETEROSCOPY AND STENT PLACEMENT;  Surgeon: Lowella Bandy, MD;  Location: [facility];  Service: Urology;  Laterality: Left;  . DEEP BRAIN STIMULATOR PLACEMENT  11-29-2012   genertor device at left pectoral area--- not activated yet  . EXTRACORPOREAL SHOCK WAVE LITHOTRIPSY Left 10-11-2010  &  01-05-2011  . HOLMIUM LASER APPLICATION Left 43/04/2949   Procedure: HOLMIUM LASER APPLICATION;  Surgeon: Hanley Ben, MD;  Location: [facility];  Service: Urology;  Laterality: Left;  . HOLMIUM LASER APPLICATION Left 09/09/4164   Procedure: HOLMIUM LASER APPLICATION;  Surgeon: Lowella Bandy, MD;  Location: [facility];  Service: Urology;  Laterality: Left;  . NEGATIVE SLEEP STUDY  2013  per pt  . SP PERC NEPHROSTOMY Left 12-16-2012  . STONE EXTRACTION WITH BASKET Left 01/06/2013   Procedure: STONE EXTRACTION WITH BASKET;  Surgeon: Hanley Ben, MD;  Location: [facility];  Service: Urology;  Laterality: Left;    There were no vitals filed for this visit.      Subjective Assessment - 08/29/15 2024    Subjective Running a little late-overslept this morning.  Went bowling earlier this week, so it made my hip a little sore.   Pertinent History Parkinson's disease since 2008; deep brain stimulation R side (L brain) 2014, L side (R brain)April 2017   Patient Stated Goals  Pt's goal for therapy is to improve strength, feel more comfortable that I won't fall when I'm walking.   Currently in Pain? Yes   Pain Score 3    Pain Location Neck   Pain Orientation Left   Pain Descriptors / Indicators Aching   Pain Type Chronic pain   Pain Onset More than a month ago   Pain Frequency Intermittent   Aggravating Factors  malpositioning   Pain Relieving Factors repositioning and exercises                                    PT Short Term Goals - 08/16/15 1105      PT SHORT TERM GOAL #1   Title Pt will perform HEP for improved balance, transfers, gait and posture/decreased pain.  TARGET 08/15/15   Time 4   Period Weeks   Status Achieved     PT SHORT TERM GOAL #2   Title Pt will improve 5x sit<>stand to less than or equal to 16 seconds for improved transfer efficiency and safety.   Time 4   Period Weeks   Status Not Met     PT SHORT TERM GOAL #3   Title Pt will improve TUG score to less than or equal to 13.5 seconds for decreased fall risk.   Status Not Met     PT SHORT TERM GOAL #4   Title Pt will improve Functional Gait Assessment to at least 18/30 for decreased fall risk.   Time 4   Period Weeks   Status Achieved     PT SHORT TERM GOAL #5   Title Pt/family will verbalize understanding of local Parkinson's disease resources.   Period Weeks   Status On-going           PT Long Term Goals - 08/16/15 1203      PT LONG TERM GOAL #1   Title Pt will verbalize/demonstrate understanding of tips to reduce freezing with gait and turns.  TARGET 09/16/15   Time 8   Period Weeks   Status On-going     PT LONG TERM GOAL #2   Title Pt will improve 5x sit<>stand to less than or equal to 14 secondsf or improved efficiency and safety with transfers.   Time 8   Period Weeks   Status On-going     PT LONG TERM GOAL #3   Title Pt will improve TUG manual to less than or equal to 15 seconds for improved dual tasking with gait.   Time 8   Period Weeks   Status On-going     PT LONG TERM GOAL #4   Title PT will improve Fucntional Gait assessment to at least 20/30 for decreased fall risk.   Time 8   Period Weeks   Status On-going     PT LONG TERM GOAL #5   Title Pt will improve gait velocity to at elast 2.62 ft/sec for imrpoved gait efficiency and safety.   Time 8   Period Weeks   Status On-going     Additional Long Term Goals   Additional  Long Term Goals Yes     PT LONG TERM GOAL #6   Title Pt will verbalize plans for continued community fitness upon D/C from PT.   Time 8   Period Weeks   Status On-going     PT LONG TERM GOAL #7   Title Pt/family will verbalize understanding of  local Parkinson's disease resources.   Time 8   Period Weeks   Status On-going               Plan - 09/24/15 2032-04-27    Clinical Impression Statement Focused treatment session on multi-directional stepping with added complexity (cognitive challenge) and change of direction/turning with gait. Pt continues to have narrowed BOS with turns.  Pt will continue to benefit from added challenge of cognitive or manual tasks to gait and multi-directional stepping activities.   Rehab Potential Good   PT Frequency 2x / week   PT Duration 8 weeks  plus eval   PT Treatment/Interventions ADLs/Self Care Home Management;Therapeutic exercise;Therapeutic activities;Functional mobility training;Stair training;Gait training;DME Instruction;Balance training;Neuromuscular re-education;Patient/family education   PT Next Visit Plan Continue dynamic gait and balance activities, turns and weightshifting, variable direction stepping-with added cognitive and manual tasks.  Discuss likely discharge next week per POC.   Consulted and Agree with Plan of Care Patient   Family Member Consulted wife      Patient will benefit from skilled therapeutic intervention in order to improve the following deficits and impairments:  Abnormal gait, Decreased balance, Decreased mobility, Decreased range of motion, Decreased safety awareness, Decreased strength, Difficulty walking, Postural dysfunction, Pain  Visit Diagnosis: Other abnormalities of gait and mobility  Unsteadiness on feet       G-Codes - 09/24/15 04-27-2104    Functional Assessment Tool Used gait velocity 2.6 ft/sec, TUG manual 16.07 sec, TUG 14.67 sec, 5x sit<>stand 15.26 sec   Mobility: Walking and Moving Around Current  Status (F9692) At least 20 percent but less than 40 percent impaired, limited or restricted   Mobility: Walking and Moving Around Goal Status [phone]) At least 20 percent but less than 40 percent impaired, limited or restricted      Problem List Patient Active Problem List   Diagnosis Date Noted  . Acute pyelonephritis 12/16/2012  . Sepsis ([place]) 12/15/2012  . Parkinson's disease ([place]) 12/15/2012    Braydin Aloi W. September 24, 2015, 9:07 PM  Frazier Butt., PT  [facility] [address] [place], [place], [address] Phone: [phone]   Fax:  [phone]  Name: Baylon Santelli MRN: [id number] Date of Birth: 01-07-1946  Physical Therapy Progress Note  Dates of Reporting Period: 07/17/15 to 08/28/15  Objective Reports of Subjective Statement: Improved gait with no reported falls; improved 5x sit<>stand and gait velocity   Objective Measurements: Gait velocity 2.6 ft/sec, 5x sit<>stand 15.26 sec, TUG 14.67 sec, TUG manual 16.07 sec  Goal Update: Pt has met 1 of 4 short term goals and is progressing towards LTGs.  He has improved FGA, gait velocity and 5x sit<>stand measures.  Plan: Continue per POC, to address dynamic balance, gait and turns for decreased fall risk.  Reason Skilled Services are Required: Pt continues to be at fall risk per FGA and TUG scores.  Pt has difficulty with turns, though improving.  Pt would continue to benefit from  Further skilled PT to continue balance, gait and turning activities, with added complexity of dual tasking, to simulate daily activities.  Mady Haagensen, PT Sep 24, 2015 9:12 PM Phone: [phone] Fax: [phone]

## 2015-08-30 ENCOUNTER — Ambulatory Visit: Payer: Medicare Other | Admitting: Rehabilitative and Restorative Service Providers"

## 2015-08-30 ENCOUNTER — Ambulatory Visit: Payer: Medicare Other

## 2015-08-30 DIAGNOSIS — R2681 Unsteadiness on feet: Secondary | ICD-10-CM | POA: Diagnosis not present

## 2015-08-30 DIAGNOSIS — R29898 Other symptoms and signs involving the musculoskeletal system: Secondary | ICD-10-CM | POA: Diagnosis not present

## 2015-08-30 DIAGNOSIS — R2689 Other abnormalities of gait and mobility: Secondary | ICD-10-CM | POA: Diagnosis not present

## 2015-08-30 DIAGNOSIS — R471 Dysarthria and anarthria: Secondary | ICD-10-CM

## 2015-08-30 DIAGNOSIS — R29818 Other symptoms and signs involving the nervous system: Secondary | ICD-10-CM | POA: Diagnosis not present

## 2015-08-30 DIAGNOSIS — R278 Other lack of coordination: Secondary | ICD-10-CM | POA: Diagnosis not present

## 2015-08-30 NOTE — Therapy (Signed)
[facility] [address] [place], [place], [address] Phone: [phone]   Fax:  [phone]  Physical Therapy Treatment  Patient Details  Name: [name] [name] MRN: [id number] Date of Birth: March 07, 1945 Referring Provider: Maxine Glenn  Encounter Date: 08/30/2015      PT End of Session - 08/30/15 1038    Visit Number 11   Number of Visits 17   Date for PT Re-Evaluation 09/15/15   Authorization Type Medicare/ BCBS-Gcode every 10th visit   PT Start Time 1020   PT Stop Time 1100   PT Time Calculation (min) 40 min   Activity Tolerance Patient tolerated treatment well   Behavior During Therapy [facility] for tasks assessed/performed      Past Medical History:  Diagnosis Date  . BPH (benign prostatic hypertrophy)   . Constipation   . Depression   . Fatigue    d/t Parkinson's  . GERD (gastroesophageal reflux disease)   . History of kidney stones   . Left ureteral calculus   . Lower urinary tract symptoms (LUTS)   . Nephrolithiasis   . Orthostatic hypotension    d/t Parkinson's disease  . Parkinson's disease ([place])   . Peyronie disease   . Pneumonia   . Renal cyst, left   . S/P deep brain stimulator placement    11-29-2012  . Wears glasses   . Wears hearing aid    BILATERAL-- INTERMITTANT WEARS    Past Surgical History:  Procedure Laterality Date  . CHOLECYSTECTOMY  12/21/2014   Procedure: LAPAROSCOPIC CHOLECYSTECTOMY;  Surgeon: Ralene Ok, MD;  Location: [place];  Service: General;;  . COLONOSCOPY    . CYSTO/  LEFT URETEROSOCPY/   LEFT URETERAL STENT PLACEMENT/  LASER BLADDER STONES AND EXTRACTION  09-25-2010  . CYSTOSCOPY WITH RETROGRADE PYELOGRAM, URETEROSCOPY AND STENT PLACEMENT Left 01/06/2013   Procedure: CYSTOSCOPY WITH RETROGRADE PYELOGRAM, URETEROSCOPY AND STENT PLACEMENT;  Surgeon: Hanley Ben, MD;  Location: [place];  Service: Urology;  Laterality: Left;  . CYSTOSCOPY WITH RETROGRADE  PYELOGRAM, URETEROSCOPY AND STENT PLACEMENT Left 09/11/2014   Procedure: CYSTOSCOPY WITH LEFT RETROGRADE PYELOGRAM, URETEROSCOPY AND STENT PLACEMENT;  Surgeon: Lowella Bandy, MD;  Location: [facility];  Service: Urology;  Laterality: Left;  . DEEP BRAIN STIMULATOR PLACEMENT  11-29-2012   genertor device at left pectoral area--- not activated yet  . EXTRACORPOREAL SHOCK WAVE LITHOTRIPSY Left 10-11-2010  &  01-05-2011  . HOLMIUM LASER APPLICATION Left 71/06/9537   Procedure: HOLMIUM LASER APPLICATION;  Surgeon: Hanley Ben, MD;  Location: [facility];  Service: Urology;  Laterality: Left;  . HOLMIUM LASER APPLICATION Left 07/10/2895   Procedure: HOLMIUM LASER APPLICATION;  Surgeon: Lowella Bandy, MD;  Location: [facility];  Service: Urology;  Laterality: Left;  . NEGATIVE SLEEP STUDY  2013  per pt  . SP PERC NEPHROSTOMY Left 12-16-2012  . STONE EXTRACTION WITH BASKET Left 01/06/2013   Procedure: STONE EXTRACTION WITH BASKET;  Surgeon: Hanley Ben, MD;  Location: [place];  Service: Urology;  Laterality: Left;    There were no vitals filed for this visit.      Subjective Assessment - 08/30/15 1023    Subjective The patient reports his biggest challenge at home is negotiating tight spots and slowing down.  He notes he is getting up and down easier, and he is taking larger steps when he remembers.    Pertinent History Parkinson's disease since 2008; deep brain stimulation R side (L brain)  2014, L side (R brain)April 2017   Patient Stated Goals Pt's goal for therapy is to improve strength, feel more comfortable that I won't fall when I'm walking.   Currently in Pain? Yes   Pain Score 3    Pain Location Neck   Pain Orientation Left   Pain Descriptors / Indicators Aching   Pain Type Chronic pain   Pain Onset More than a month ago   Pain Frequency Intermittent   Aggravating Factors  turning/rotation   Pain Relieving Factors  repositioning and exercise      Gait: See FGA below Dynamic gait activities on indoor/level surfaces as well as outdoor, compliant surfaces with 180 degree turns and direction changes ant/posterior walking.       [facility] PT Assessment - 08/30/15 1046      Ambulation/Gait   Ambulation/Gait Yes   Ambulation/Gait Assistance 7: Independent     Functional Gait  Assessment   Gait assessed  Yes   Gait Level Surface Walks 20 ft in less than 5.5 sec, no assistive devices, good speed, no evidence for imbalance, normal gait pattern, deviates no more than 6 in outside of the 12 in walkway width.  4.88 seconds   Change in Gait Speed Able to smoothly change walking speed without loss of balance or gait deviation. Deviate no more than 6 in outside of the 12 in walkway width.   Gait with Horizontal Head Turns Performs head turns smoothly with slight change in gait velocity (eg, minor disruption to smooth gait path), deviates 6-10 in outside 12 in walkway width, or uses an assistive device.   Gait with Vertical Head Turns Performs head turns with no change in gait. Deviates no more than 6 in outside 12 in walkway width.   Gait and Pivot Turn Pivot turns safely within 3 sec and stops quickly with no loss of balance.   Step Over Obstacle Is able to step over 2 stacked shoe boxes taped together (9 in total height) without changing gait speed. No evidence of imbalance.   Gait with Narrow Base of Support Is able to ambulate for 10 steps heel to toe with no staggering.   Gait with Eyes Closed Walks 20 ft, no assistive devices, good speed, no evidence of imbalance, normal gait pattern, deviates no more than 6 in outside 12 in walkway width. Ambulates 20 ft in less than 7 sec.   Ambulating Backwards Walks 20 ft, uses assistive device, slower speed, mild gait deviations, deviates 6-10 in outside 12 in walkway width.   Steps Alternating feet, must use rail.   Total Score 27   FGA comment: 27/30       NEUROMUSCULAR RE-EDUCATION: Warm up with PWR anterior lunges/return to midline Step R<midline>L with UE movements in posterior/diagonal direction x 5 reps  Outdoors PWR exercises performed on compliant surface (rubber mulch) in lateral to midline stepping, and anterior to midline lunges with UE focus for "Big" movements Negotiating 2 chairs set up in figure 8 pattern for turns in tight spaces and direction changes  Also performed 5 times sit<>stand *see goals for performance     PT Short Term Goals - 08/16/15 1105      PT SHORT TERM GOAL #1   Title Pt will perform HEP for improved balance, transfers, gait and posture/decreased pain.  TARGET 08/15/15   Time 4   Period Weeks   Status Achieved     PT SHORT TERM GOAL #2   Title Pt will improve 5x sit<>stand to  less than or equal to 16 seconds for improved transfer efficiency and safety.   Time 4   Period Weeks   Status Not Met     PT SHORT TERM GOAL #3   Title Pt will improve TUG score to less than or equal to 13.5 seconds for decreased fall risk.   Status Not Met     PT SHORT TERM GOAL #4   Title Pt will improve Functional Gait Assessment to at least 18/30 for decreased fall risk.   Time 4   Period Weeks   Status Achieved     PT SHORT TERM GOAL #5   Title Pt/family will verbalize understanding of local Parkinson's disease resources.   Period Weeks   Status On-going           PT Long Term Goals - 08/30/15 1041      PT LONG TERM GOAL #1   Title Pt will verbalize/demonstrate understanding of tips to reduce freezing with gait and turns.  TARGET 09/16/15   Time 8   Period Weeks   Status On-going     PT LONG TERM GOAL #2   Title Pt will improve 5x sit<>stand to less than or equal to 14 secondsf or improved efficiency and safety with transfers.   Baseline 1st rep=15.28 with one episode of posterior lean (had to repeat rep 3); 2nd rep=12.25 seconds  AVG=13.77 seconds   Time 8   Period Weeks   Status Achieved     PT LONG  TERM GOAL #3   Title Pt will improve TUG manual to less than or equal to 15 seconds for improved dual tasking with gait.   Time 8   Period Weeks   Status On-going     PT LONG TERM GOAL #4   Title PT will improve Fucntional Gait assessment to at least 20/30 for decreased fall risk.   Baseline Improved to 27/30   Time 8   Period Weeks   Status Achieved     PT LONG TERM GOAL #5   Title Pt will improve gait velocity to at elast 2.62 ft/sec for imrpoved gait efficiency and safety.   Baseline At end of session, patient walks 3.9 ft/sec when told to move at comfortable pace (patient was warmed up)- may want to measure at start of session.   Time 8   Period Weeks   Status Achieved     PT LONG TERM GOAL #6   Title Pt will verbalize plans for continued community fitness upon D/C from PT.   Time 8   Period Weeks   Status On-going     PT LONG TERM GOAL #7   Title Pt/family will verbalize understanding of local Parkinson's disease resources.   Time 8   Period Weeks   Status On-going               Plan - 08/30/15 1154    Clinical Impression Statement The patient met 3 LTGs. He is ready for d/c next week and reqeusts to review HEP, continue working on dynamic gait activities, as well as turning in tight spaces as main areas of focus for last 2 sessions.   PT Treatment/Interventions ADLs/Self Care Home Management;Therapeutic exercise;Therapeutic activities;Functional mobility training;Stair training;Gait training;DME Instruction;Balance training;Neuromuscular re-education;Patient/family education   PT Next Visit Plan Work on turning in tight spaces, review HEP, dynamic balance/gait, discharge next week.    Consulted and Agree with Plan of Care Patient      Patient will benefit from skilled therapeutic  intervention in order to improve the following deficits and impairments:  Abnormal gait, Decreased balance, Decreased mobility, Decreased range of motion, Decreased safety awareness,  Decreased strength, Difficulty walking, Postural dysfunction, Pain  Visit Diagnosis: Other abnormalities of gait and mobility  Unsteadiness on feet  Other symptoms and signs involving the nervous system       G-Codes - 09/18/2015 [id number]    Functional Assessment Tool Used gait velocity 2.6 ft/sec, TUG manual 16.07 sec, TUG 14.67 sec, 5x sit<>stand 15.26 sec   Mobility: Walking and Moving Around Current Status ([id number]) At least 20 percent but less than 40 percent impaired, limited or restricted   Mobility: Walking and Moving Around Goal Status [phone]) At least 20 percent but less than 40 percent impaired, limited or restricted      Problem List Patient Active Problem List   Diagnosis Date Noted  . Acute pyelonephritis 12/16/2012  . Sepsis ([place]) 12/15/2012  . Parkinson's disease ([place]) 12/15/2012    [place], PT 08/30/2015, 11:57 AM  [facility] [address] [place], [place], [address] Phone: [phone]   Fax:  [phone]  Name: [name] [name] MRN: [id number] Date of Birth: 03-18-1945

## 2015-08-30 NOTE — Therapy (Signed)
[facility] [address] [place], [place], [address] Phone: [phone]   Fax:  [phone]  Speech Language Pathology Treatment  Patient Details  Name: [name] [name] MRN: [id number] Date of Birth: 06/11/1945 No Data Recorded  Encounter Date: 08/30/2015      End of Session - 08/30/15 1350    Visit Number 9   Number of Visits 17   Date for SLP Re-Evaluation 09/27/15   SLP Start Time 1104   SLP Stop Time  1145   SLP Time Calculation (min) 41 min   Activity Tolerance Patient tolerated treatment well      Past Medical History:  Diagnosis Date  . BPH (benign prostatic hypertrophy)   . Constipation   . Depression   . Fatigue    d/t Parkinson's  . GERD (gastroesophageal reflux disease)   . History of kidney stones   . Left ureteral calculus   . Lower urinary tract symptoms (LUTS)   . Nephrolithiasis   . Orthostatic hypotension    d/t Parkinson's disease  . Parkinson's disease ([place])   . Peyronie disease   . Pneumonia   . Renal cyst, left   . S/P deep brain stimulator placement    11-29-2012  . Wears glasses   . Wears hearing aid    BILATERAL-- INTERMITTANT WEARS    Past Surgical History:  Procedure Laterality Date  . CHOLECYSTECTOMY  12/21/2014   Procedure: LAPAROSCOPIC CHOLECYSTECTOMY;  Surgeon: Ralene Ok, MD;  Location: [facility];  Service: General;;  . COLONOSCOPY    . CYSTO/  LEFT URETEROSOCPY/   LEFT URETERAL STENT PLACEMENT/  LASER BLADDER STONES AND EXTRACTION  09-25-2010  . CYSTOSCOPY WITH RETROGRADE PYELOGRAM, URETEROSCOPY AND STENT PLACEMENT Left 01/06/2013   Procedure: CYSTOSCOPY WITH RETROGRADE PYELOGRAM, URETEROSCOPY AND STENT PLACEMENT;  Surgeon: Hanley Ben, MD;  Location: [place];  Service: Urology;  Laterality: Left;  . CYSTOSCOPY WITH RETROGRADE PYELOGRAM, URETEROSCOPY AND STENT PLACEMENT Left 09/11/2014   Procedure: CYSTOSCOPY WITH LEFT RETROGRADE PYELOGRAM,  URETEROSCOPY AND STENT PLACEMENT;  Surgeon: Lowella Bandy, MD;  Location: [facility];  Service: Urology;  Laterality: Left;  . DEEP BRAIN STIMULATOR PLACEMENT  11-29-2012   genertor device at left pectoral area--- not activated yet  . EXTRACORPOREAL SHOCK WAVE LITHOTRIPSY Left 10-11-2010  &  01-05-2011  . HOLMIUM LASER APPLICATION Left 92/02/1939   Procedure: HOLMIUM LASER APPLICATION;  Surgeon: Hanley Ben, MD;  Location: [place];  Service: Urology;  Laterality: Left;  . HOLMIUM LASER APPLICATION Left 08/05/812   Procedure: HOLMIUM LASER APPLICATION;  Surgeon: Lowella Bandy, MD;  Location: [facility];  Service: Urology;  Laterality: Left;  . NEGATIVE SLEEP STUDY  2013  per pt  . SP PERC NEPHROSTOMY Left 12-16-2012  . STONE EXTRACTION WITH BASKET Left 01/06/2013   Procedure: STONE EXTRACTION WITH BASKET;  Surgeon: Hanley Ben, MD;  Location: [facility] [place];  Service: Urology;  Laterality: Left;    There were no vitals filed for this visit.      Subjective Assessment - 08/30/15 1107    Subjective Pt entered room talking at 70dB with SLP prior to loud /a/.   Patient is accompained by: Family member  wife               ADULT SLP TREATMENT - 08/30/15 1109      General Information   Behavior/Cognition Alert;Cooperative;Pleasant mood     Treatment Provided   Treatment provided Cognitive-Linquistic  Cognitive-Linquistic Treatment   Treatment focused on Dysarthria   Skilled Treatment Facilitated loud /a/ to recalibrate pt's volume in order to assist pt to improve conversational loudness - average 92dB. Pitch exercises were facilitated in order to decr monotonous speech. In strucutred speech tasks of sentence responses pt maintained 70dB independently. SLP req'd to provide verbal and visual cues for loudness and effort occasionally in mod complex sentence tasks. SLP faciliatated pt's louder speech in multitasking  situation of walking with pt maintaining intelligible speech 95% of the time for approx 5 minutes of walking in a mod noisy environment. Pt made aware that thinking aobut loudness may well naturally also incr breath support for speech. Pt with cont'd rare carryover of louder speech in non-therapy tasks during therapy session. In multi sentence responses, pt was 85% successful for maintaining 70dB loudness.     Assessment / Recommendations / Plan   Plan Continue with current plan of care     Progression Toward Goals   Progression toward goals Progressing toward goals            SLP Short Term Goals - 08/28/15 1223      SLP SHORT TERM GOAL #1   Title pt will sustain /a/ average 88dB over 3 sessions   Status Partially Met     SLP SHORT TERM GOAL #2   Title pt will  maintain 70dB loudness in 19/20 sentence responses   Status Achieved     SLP SHORT TERM GOAL #3   Title pt will tell SLP 3 s/s aspiration PNA with modified independence   Status Deferred  carry over into LTGs          SLP Long Term Goals - 08/30/15 1352      SLP LONG TERM GOAL #1   Title pt will sustsin /a/ average 89dB over 4 sessions   Baseline three sessions 08-30-15   Time 4   Period Weeks   Status On-going     SLP LONG TERM GOAL #2   Title pt will engage in 10 minutes mod complex conversation with average 70dB and min A rarely for loudness   Time 4   Period Weeks   Status Revised     SLP LONG TERM GOAL #3   Title pt will report improved vocal quality in 10 minutes mod complex conversation, compared to first session ST   Time 4   Period Weeks   Status On-going     SLP LONG TERM GOAL #4   Title pt will tell SLP 3 s/s aspiration PNA with modified independence   Time 4   Period Weeks   Status On-going          Plan - 08/30/15 1351    Clinical Impression Statement 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 Pt cont's to require  cues from SLP in simple conversation, but more frequent cueing when linguistic demand increases.  Skilled ST remains needed to improve pt's conversational loudness to WNL level as well as address anomia.   Speech Therapy Frequency 2x / week   Duration 4 weeks   Treatment/Interventions SLP instruction and feedback;Compensatory strategies;Internal/external aids;Patient/family education;Functional tasks;Cueing hierarchy   Potential to Achieve Goals Good   Consulted and Agree with Plan of Care Patient      Patient will benefit from skilled therapeutic intervention in order to improve the following deficits and impairments:   Dysarthria and anarthria    Problem List Patient  Active Problem List   Diagnosis Date Noted  . Acute pyelonephritis 12/16/2012  . Sepsis ([place]) 12/15/2012  . Parkinson's disease ([place]) 12/15/2012    [facility] ,MS, [place]  08/30/2015, 1:52 PM  [facility] [address] [place], [place], [address] Phone: [phone]   Fax:  [phone]   Name: [name] [name] MRN: [id number] Date of Birth: 12/19/1945

## 2015-09-03 ENCOUNTER — Ambulatory Visit: Payer: Medicare Other | Admitting: Speech Pathology

## 2015-09-03 ENCOUNTER — Ambulatory Visit: Payer: Medicare Other | Admitting: Physical Therapy

## 2015-09-03 ENCOUNTER — Ambulatory Visit: Payer: Medicare Other | Attending: Neurology | Admitting: Occupational Therapy

## 2015-09-03 DIAGNOSIS — R2689 Other abnormalities of gait and mobility: Secondary | ICD-10-CM | POA: Insufficient documentation

## 2015-09-03 DIAGNOSIS — R29898 Other symptoms and signs involving the musculoskeletal system: Secondary | ICD-10-CM

## 2015-09-03 DIAGNOSIS — R29818 Other symptoms and signs involving the nervous system: Secondary | ICD-10-CM

## 2015-09-03 DIAGNOSIS — R471 Dysarthria and anarthria: Secondary | ICD-10-CM | POA: Diagnosis not present

## 2015-09-03 DIAGNOSIS — R278 Other lack of coordination: Secondary | ICD-10-CM | POA: Insufficient documentation

## 2015-09-03 DIAGNOSIS — R2681 Unsteadiness on feet: Secondary | ICD-10-CM | POA: Insufficient documentation

## 2015-09-03 DIAGNOSIS — R41844 Frontal lobe and executive function deficit: Secondary | ICD-10-CM | POA: Diagnosis not present

## 2015-09-03 NOTE — Therapy (Signed)
[facility] [address] [place], [place], [address] Phone: [phone]   Fax:  [phone]  Speech Language Pathology Treatment  Patient Details  Name: [name] [name] MRN: [id number] Date of Birth: Dec 13, 1945 No Data Recorded  Encounter Date: 09/03/2015      End of Session - 09/03/15 1004    Visit Number 10   Number of Visits 17   Date for SLP Re-Evaluation 09/27/15   SLP Start Time 0852   SLP Stop Time  0935   SLP Time Calculation (min) 43 min   Activity Tolerance Patient tolerated treatment well      Past Medical History:  Diagnosis Date  . BPH (benign prostatic hypertrophy)   . Constipation   . Depression   . Fatigue    d/t Parkinson's  . GERD (gastroesophageal reflux disease)   . History of kidney stones   . Left ureteral calculus   . Lower urinary tract symptoms (LUTS)   . Nephrolithiasis   . Orthostatic hypotension    d/t Parkinson's disease  . Parkinson's disease ([place])   . Peyronie disease   . Pneumonia   . Renal cyst, left   . S/P deep brain stimulator placement    11-29-2012  . Wears glasses   . Wears hearing aid    BILATERAL-- INTERMITTANT WEARS    Past Surgical History:  Procedure Laterality Date  . CHOLECYSTECTOMY  12/21/2014   Procedure: LAPAROSCOPIC CHOLECYSTECTOMY;  Surgeon: Ralene Ok, MD;  Location: [facility];  Service: General;;  . COLONOSCOPY    . CYSTO/  LEFT URETEROSOCPY/   LEFT URETERAL STENT PLACEMENT/  LASER BLADDER STONES AND EXTRACTION  09-25-2010  . CYSTOSCOPY WITH RETROGRADE PYELOGRAM, URETEROSCOPY AND STENT PLACEMENT Left 01/06/2013   Procedure: CYSTOSCOPY WITH RETROGRADE PYELOGRAM, URETEROSCOPY AND STENT PLACEMENT;  Surgeon: Hanley Ben, MD;  Location: [facility];  Service: Urology;  Laterality: Left;  . CYSTOSCOPY WITH RETROGRADE PYELOGRAM, URETEROSCOPY AND STENT PLACEMENT Left 09/11/2014   Procedure: CYSTOSCOPY WITH LEFT RETROGRADE PYELOGRAM,  URETEROSCOPY AND STENT PLACEMENT;  Surgeon: Lowella Bandy, MD;  Location: [facility];  Service: Urology;  Laterality: Left;  . DEEP BRAIN STIMULATOR PLACEMENT  11-29-2012   genertor device at left pectoral area--- not activated yet  . EXTRACORPOREAL SHOCK WAVE LITHOTRIPSY Left 10-11-2010  &  01-05-2011  . HOLMIUM LASER APPLICATION Left 34/08/4257   Procedure: HOLMIUM LASER APPLICATION;  Surgeon: Hanley Ben, MD;  Location: [facility];  Service: Urology;  Laterality: Left;  . HOLMIUM LASER APPLICATION Left 06/07/3873   Procedure: HOLMIUM LASER APPLICATION;  Surgeon: Lowella Bandy, MD;  Location: [facility];  Service: Urology;  Laterality: Left;  . NEGATIVE SLEEP STUDY  2013  per pt  . SP PERC NEPHROSTOMY Left 12-16-2012  . STONE EXTRACTION WITH BASKET Left 01/06/2013   Procedure: STONE EXTRACTION WITH BASKET;  Surgeon: Hanley Ben, MD;  Location: [facility];  Service: Urology;  Laterality: Left;    There were no vitals filed for this visit.      Subjective Assessment - 09/03/15 0953    Subjective "I get frustrated when people ask me to repeat myself during conversation"   Patient is accompained by: Family member   Currently in Pain? No/denies   Pain Score 0-No pain               ADULT SLP TREATMENT - 09/03/15 0955      General Information   Behavior/Cognition Alert;Cooperative;Pleasant mood  Treatment Provided   Treatment provided Cognitive-Linquistic     Pain Assessment   Pain Assessment No/denies pain     Cognitive-Linquistic Treatment   Treatment focused on Dysarthria   Skilled Treatment Facilitated loud /a/ to recalibrate pt's volume in order to assist pt to improve conversational loudness - average 92dB. Pitch exercises were facilitated in order to decr monotonous speech. In strucutred speech tasks of sentence responses pt maintained 70dB independently. SLP req'd to provide verbal and visual cues for  loudness and effort occasionally in mod complex sentence tasks. SLP faciliatated pt's louder speech in multitasking situation of walking with pt maintaining intelligible speech 95% of the time for approx 5 minutes of walking in a mod noisy environment. Pt made aware that thinking aobut loudness may well naturally also incr breath support for speech. Pt with cont'd rare carryover of louder speech in non-therapy tasks during therapy session. In multi sentence responses, pt was 85% successful for maintaining 70dB loudness.     Assessment / Recommendations / Plan   Plan Continue with current plan of care     Progression Toward Goals   Progression toward goals Progressing toward goals          SLP Education - 09/03/15 1001    Education provided Yes   Education Details Increasing breath support during conversation; compensatory strategies, dysphagia strategies   Person(s) Educated Patient;Spouse   Methods Explanation;Demonstration;Verbal cues   Comprehension Verbalized understanding;Verbal cues required          SLP Short Term Goals - 08/28/15 1223      SLP SHORT TERM GOAL #1   Title pt will sustain /a/ average 88dB over 3 sessions   Status Partially Met     SLP SHORT TERM GOAL #2   Title pt will  maintain 70dB loudness in 19/20 sentence responses   Status Achieved     SLP SHORT TERM GOAL #3   Title pt will tell SLP 3 s/s aspiration PNA with modified independence   Status Deferred  carry over into LTGs          SLP Long Term Goals - 09/03/15 1014      SLP LONG TERM GOAL #1   Title pt will sustsin /a/ average 89dB over 4 sessions   Baseline three sessions 08-30-15   Time 3   Period Weeks   Status On-going     SLP LONG TERM GOAL #2   Title pt will engage in 10 minutes mod complex conversation with average 70dB and min A rarely for loudness   Time 3   Period Weeks   Status Revised     SLP LONG TERM GOAL #3   Title pt will report improved vocal quality in 10 minutes mod  complex conversation, compared to first session ST   Time 3   Period Weeks   Status On-going     SLP LONG TERM GOAL #4   Title pt will tell SLP 3 s/s aspiration PNA with modified independence   Time 3   Period Weeks   Status On-going          Plan - 09/03/15 1005    Clinical Impression Statement Pt continues to present with decreased breath support for speech in conversation without moderate verbal/visual cueing provided by SLP; pt requires consistent SLP verbal/visual cues utilizing sound level meter/postural cueing; continue with current POC   Speech Therapy Frequency 2x / week   Duration --  3 weeks   Treatment/Interventions Compensatory strategies;Patient/family education;Multimodal communcation  approach;SLP instruction and feedback;Language facilitation;Functional tasks;Cueing hierarchy   Potential to Achieve Goals Good   Consulted and Agree with Plan of Care Patient;Family member/caregiver      Patient will benefit from skilled therapeutic intervention in order to improve the following deficits and impairments:   Dysarthria and anarthria    Problem List Patient Active Problem List   Diagnosis Date Noted  . Acute pyelonephritis 12/16/2012  . Sepsis ([place]) 12/15/2012  . Parkinson's disease ([place]) 12/15/2012    Satine Hausner,PAT, M.S., CCC-SLP 09/03/2015, 11:04 AM  [facility] [address] [place], [place], [address] Phone: [phone]   Fax:  [phone]   Name: Zayquan Bogard MRN: [id number] Date of Birth: 09-09-1945

## 2015-09-03 NOTE — Therapy (Signed)
[facility] [address] [place], [place], [address] Phone: [phone]   Fax:  [phone]  Physical Therapy Treatment  Patient Details  Name: [name] [name] MRN: [id number] Date of Birth: Oct 02, 1945 Referring Provider: Maxine Glenn  Encounter Date: 09/03/2015      PT End of Session - 09/03/15 1535    Visit Number 12   Number of Visits 17   Date for PT Re-Evaluation 09/15/15   Authorization Type Medicare/ BCBS-Gcode every 10th visit   PT Start Time 0936   PT Stop Time 1018   PT Time Calculation (min) 42 min   Activity Tolerance Patient tolerated treatment well   Behavior During Therapy [facility] for tasks assessed/performed      Past Medical History:  Diagnosis Date  . BPH (benign prostatic hypertrophy)   . Constipation   . Depression   . Fatigue    d/t Parkinson's  . GERD (gastroesophageal reflux disease)   . History of kidney stones   . Left ureteral calculus   . Lower urinary tract symptoms (LUTS)   . Nephrolithiasis   . Orthostatic hypotension    d/t Parkinson's disease  . Parkinson's disease ([place])   . Peyronie disease   . Pneumonia   . Renal cyst, left   . S/P deep brain stimulator placement    11-29-2012  . Wears glasses   . Wears hearing aid    BILATERAL-- INTERMITTANT WEARS    Past Surgical History:  Procedure Laterality Date  . CHOLECYSTECTOMY  12/21/2014   Procedure: LAPAROSCOPIC CHOLECYSTECTOMY;  Surgeon: Ralene Ok, MD;  Location: [facility];  Service: General;;  . COLONOSCOPY    . CYSTO/  LEFT URETEROSOCPY/   LEFT URETERAL STENT PLACEMENT/  LASER BLADDER STONES AND EXTRACTION  09-25-2010  . CYSTOSCOPY WITH RETROGRADE PYELOGRAM, URETEROSCOPY AND STENT PLACEMENT Left 01/06/2013   Procedure: CYSTOSCOPY WITH RETROGRADE PYELOGRAM, URETEROSCOPY AND STENT PLACEMENT;  Surgeon: Hanley Ben, MD;  Location: [facility];  Service: Urology;  Laterality: Left;  . CYSTOSCOPY WITH RETROGRADE  PYELOGRAM, URETEROSCOPY AND STENT PLACEMENT Left 09/11/2014   Procedure: CYSTOSCOPY WITH LEFT RETROGRADE PYELOGRAM, URETEROSCOPY AND STENT PLACEMENT;  Surgeon: Lowella Bandy, MD;  Location: [facility];  Service: Urology;  Laterality: Left;  . DEEP BRAIN STIMULATOR PLACEMENT  11-29-2012   genertor device at left pectoral area--- not activated yet  . EXTRACORPOREAL SHOCK WAVE LITHOTRIPSY Left 10-11-2010  &  01-05-2011  . HOLMIUM LASER APPLICATION Left 93/08/9022   Procedure: HOLMIUM LASER APPLICATION;  Surgeon: Hanley Ben, MD;  Location: [facility];  Service: Urology;  Laterality: Left;  . HOLMIUM LASER APPLICATION Left 0/10/7351   Procedure: HOLMIUM LASER APPLICATION;  Surgeon: Lowella Bandy, MD;  Location: [facility];  Service: Urology;  Laterality: Left;  . NEGATIVE SLEEP STUDY  2013  per pt  . SP PERC NEPHROSTOMY Left 12-16-2012  . STONE EXTRACTION WITH BASKET Left 01/06/2013   Procedure: STONE EXTRACTION WITH BASKET;  Surgeon: Hanley Ben, MD;  Location: [facility];  Service: Urology;  Laterality: Left;    There were no vitals filed for this visit.      Subjective Assessment - 09/03/15 1531    Subjective Continues to report difficulty in home environment and has to focus on his movements.   Patient is accompained by: Family member   Pertinent History Parkinson's disease since 2008; deep brain stimulation R side (L brain) 2014, L side (R brain)April 2017   Patient Stated Goals Pt's goal  for therapy is to improve strength, feel more comfortable that I won't fall when I'm walking.   Currently in Pain? No/denies          PWR [facility]) - 09/03/15 1532    PWR! exercises Moves in sitting;Moves in [place];Moves in standing   PWR! Up 20   PWR! Rock 20   PWR! Twist 20   PWR! Step 20   Comments modified quadruped at counter   PWR! Up 20   PWR! Rock 20   PWR! Twist 20   PWR Step 20   Comments in standing   PWR! Up 20   PWR!  Rock 20   PWR! Twist 20   PWR! Step 20   Comments in sitting      Pt needs cues during all of the PWR! Exercises for slowing down exercises and focusing on quality and intensity. Provided pt with information on PWR! Moves classes.        PT Education - 09/03/15 1533    Education provided Yes   Education Details PWR! seated, standing and modified quadruped;PWR! Moves class   Person(s) Educated Patient   Methods Explanation;Demonstration;Verbal cues   Comprehension Verbalized understanding          PT Short Term Goals - 08/16/15 1105      PT SHORT TERM GOAL #1   Title Pt will perform HEP for improved balance, transfers, gait and posture/decreased pain.  TARGET 08/15/15   Time 4   Period Weeks   Status Achieved     PT SHORT TERM GOAL #2   Title Pt will improve 5x sit<>stand to less than or equal to 16 seconds for improved transfer efficiency and safety.   Time 4   Period Weeks   Status Not Met     PT SHORT TERM GOAL #3   Title Pt will improve TUG score to less than or equal to 13.5 seconds for decreased fall risk.   Status Not Met     PT SHORT TERM GOAL #4   Title Pt will improve Functional Gait Assessment to at least 18/30 for decreased fall risk.   Time 4   Period Weeks   Status Achieved     PT SHORT TERM GOAL #5   Title Pt/family will verbalize understanding of local Parkinson's disease resources.   Period Weeks   Status On-going           PT Long Term Goals - 08/30/15 1041      PT LONG TERM GOAL #1   Title Pt will verbalize/demonstrate understanding of tips to reduce freezing with gait and turns.  TARGET 09/16/15   Time 8   Period Weeks   Status On-going     PT LONG TERM GOAL #2   Title Pt will improve 5x sit<>stand to less than or equal to 14 secondsf or improved efficiency and safety with transfers.   Baseline 1st rep=15.28 with one episode of posterior lean (had to repeat rep 3); 2nd rep=12.25 seconds  AVG=13.77 seconds   Time 8   Period Weeks    Status Achieved     PT LONG TERM GOAL #3   Title Pt will improve TUG manual to less than or equal to 15 seconds for improved dual tasking with gait.   Time 8   Period Weeks   Status On-going     PT LONG TERM GOAL #4   Title PT will improve Fucntional Gait assessment to at least 20/30 for decreased fall risk.  Baseline Improved to 27/30   Time 8   Period Weeks   Status Achieved     PT LONG TERM GOAL #5   Title Pt will improve gait velocity to at elast 2.62 ft/sec for imrpoved gait efficiency and safety.   Baseline At end of session, patient walks 3.9 ft/sec when told to move at comfortable pace (patient was warmed up)- may want to measure at start of session.   Time 8   Period Weeks   Status Achieved     PT LONG TERM GOAL #6   Title Pt will verbalize plans for continued community fitness upon D/C from PT.   Time 8   Period Weeks   Status On-going     PT LONG TERM GOAL #7   Title Pt/family will verbalize understanding of local Parkinson's disease resources.   Time 8   Period Weeks   Status On-going               Plan - 09/03/15 1535    Clinical Impression Statement Pt continues to need cues for PWR! moves to slow down and focus on quality of movement and intensity.  Continue PT per POC.   PT Treatment/Interventions ADLs/Self Care Home Management;Therapeutic exercise;Therapeutic activities;Functional mobility training;Stair training;Gait training;DME Instruction;Balance training;Neuromuscular re-education;Patient/family education   PT Next Visit Plan Finish checking goals and discharge per primary PT    Consulted and Agree with Plan of Care Patient   Family Member Consulted wife      Patient will benefit from skilled therapeutic intervention in order to improve the following deficits and impairments:  Abnormal gait, Decreased balance, Decreased mobility, Decreased range of motion, Decreased safety awareness, Decreased strength, Difficulty walking, Postural  dysfunction, Pain  Visit Diagnosis: Other symptoms and signs involving the nervous system  Other symptoms and signs involving the musculoskeletal system  Other lack of coordination  Other abnormalities of gait and mobility     Problem List Patient Active Problem List   Diagnosis Date Noted  . Acute pyelonephritis 12/16/2012  . Sepsis ([place]) 12/15/2012  . Parkinson's disease ([place]) 12/15/2012    Narda Bonds 09/03/2015, 3:38 PM  [facility] [address] [place], [place], [address] Phone: [phone]   Fax:  [phone]  Name: [name] [name] MRN: [id number] Date of Birth: July 24, 1945   Narda Bonds, [place] 09/03/15 3:38 PM Phone: [phone] Fax: [phone]

## 2015-09-03 NOTE — Therapy (Signed)
[facility] [address] [place], [place], [address] Phone: [phone]   Fax:  [phone]  Occupational Therapy Treatment  Patient Details  Name: [name] [name] MRN: [id number] Date of Birth: [birth year] Referring Provider: Dr. Maxine Glenn  Encounter Date: 09/03/2015      OT End of Session - 09/03/15 1258    Visit Number 8   Number of Visits 17   Date for OT Re-Evaluation 09/14/15   Authorization Type Medicare   Authorization Time Period G code   Authorization - Visit Number 8   Authorization - Number of Visits 10   OT Start Time 1020  2 units ended early due to pt request   OT Stop Time 1052   OT Time Calculation (min) 32 min   Activity Tolerance Patient tolerated treatment well   Behavior During Therapy [facility] for tasks assessed/performed      Past Medical History:  Diagnosis Date  . BPH (benign prostatic hypertrophy)   . Constipation   . Depression   . Fatigue    d/t Parkinson's  . GERD (gastroesophageal reflux disease)   . History of kidney stones   . Left ureteral calculus   . Lower urinary tract symptoms (LUTS)   . Nephrolithiasis   . Orthostatic hypotension    d/t Parkinson's disease  . Parkinson's disease ([place])   . Peyronie disease   . Pneumonia   . Renal cyst, left   . S/P deep brain stimulator placement    11-29-2012  . Wears glasses   . Wears hearing aid    BILATERAL-- INTERMITTANT WEARS    Past Surgical History:  Procedure Laterality Date  . CHOLECYSTECTOMY  12/21/2014   Procedure: LAPAROSCOPIC CHOLECYSTECTOMY;  Surgeon: Ralene Ok, MD;  Location: [facility];  Service: General;;  . COLONOSCOPY    . CYSTO/  LEFT URETEROSOCPY/   LEFT URETERAL STENT PLACEMENT/  LASER BLADDER STONES AND EXTRACTION  09-25-2010  . CYSTOSCOPY WITH RETROGRADE PYELOGRAM, URETEROSCOPY AND STENT PLACEMENT Left 01/06/2013   Procedure: CYSTOSCOPY WITH RETROGRADE PYELOGRAM, URETEROSCOPY AND STENT PLACEMENT;  Surgeon:  Hanley Ben, MD;  Location: [facility];  Service: Urology;  Laterality: Left;  . CYSTOSCOPY WITH RETROGRADE PYELOGRAM, URETEROSCOPY AND STENT PLACEMENT Left 09/11/2014   Procedure: CYSTOSCOPY WITH LEFT RETROGRADE PYELOGRAM, URETEROSCOPY AND STENT PLACEMENT;  Surgeon: Lowella Bandy, MD;  Location[facility];  Service: Urology;  Laterality: Left;  . DEEP BRAIN STIMULATOR PLACEMENT  11-29-2012   genertor device at left pectoral area--- not activated yet  . EXTRACORPOREAL SHOCK WAVE LITHOTRIPSY Left 10-11-2010  &  01-05-2011  . HOLMIUM LASER APPLICATION Left [id number]   Procedure: HOLMIUM LASER APPLICATION;  Surgeon: Hanley Ben, MD;  Location: [facility];  Service: Urology;  Laterality: Left;  . HOLMIUM LASER APPLICATION Left [id number]   Procedure: HOLMIUM LASER APPLICATION;  Surgeon: Lowella Bandy, MD;  Location: [facility];  Service: Urology;  Laterality: Left;  . NEGATIVE SLEEP STUDY  2013  per pt  . SP PERC NEPHROSTOMY Left 12-16-2012  . STONE EXTRACTION WITH BASKET Left 01/06/2013   Procedure: STONE EXTRACTION WITH BASKET;  Surgeon: Hanley Ben, MD;  Location: [facility];  Service: Urology;  Laterality: Left;    There were no vitals filed for this visit.      Subjective Assessment - 09/03/15 1039    Subjective  Pt reports mild neck pain   Pertinent History see EPic, bilateral DBS   Patient Stated Goals improve speed with  ADLs, improve balance   Currently in Pain? Yes   Pain Score 3    Pain Location Neck   Pain Orientation Left   Pain Descriptors / Indicators Aching   Pain Type Chronic pain   Pain Onset More than a month ago   Pain Frequency Intermittent   Aggravating Factors  turning   Pain Relieving Factors repositioning   Multiple Pain Sites No        Therapist practiced car transfers with pt/ wife x 6 reps with pt getting into passenger seat in front and back. Min v.c. / demonstration then pt  returned demonstration                 P[facility]) - 09/03/15 1303    PWR! exercises Moves in prone   PWR! Up x10   PWR! Rock x10   PWR! Twist x20   PWR! Step x10   Comments min-mod v.c./ demonstration for intensity/ larger movements             OT Education - 09/03/15 1254    Education provided Yes   Education Details PWR! prone, car transfers, ways to prevent future PD related complications   Person(s) Educated Patient;Spouse   Methods Explanation;Demonstration;Verbal cues;Handout   Comprehension Verbalized understanding;Returned demonstration;Verbal cues required          OT Short Term Goals - 09/03/15 1257      OT SHORT TERM GOAL #1   Title I with PD specific HEP   Time 4   Status Achieved     OT SHORT TERM GOAL #2   Title Pt will verbalize understanding of adapted strategies for ADLs/IADLs, including ways to minimize freezing episodes.   Period Weeks   Status Achieved     OT SHORT TERM GOAL #3   Title Pt will demonstrate improved ease with feeding as evidenced by decreasing PPT#2 to 11 secs or less.   Time 4   Period Weeks   Status On-going     OT SHORT TERM GOAL #4   Title Pt will demonstrate ability to write a paragraph with 100% legibility, and no significant decrease in letter size.   Time 4   Period Weeks   Status On-going           OT Long Term Goals - 09/03/15 1258      OT LONG TERM GOAL #1   Title Pt will verbalize understanding of ways to prevent future PD related complications and verbalize understanding of community resources.   Period Weeks   Status Achieved     OT LONG TERM GOAL #2   Title Pt will demonstrate increased ease with dressing as evidenced by decreasing 3 button/ unbutton to 35 secs or less   Baseline 39.06 secs.   Time 8   Period Weeks   Status On-going     OT LONG TERM GOAL #3   Title Pt will verbalize understanding of compenstaory strategies for cognitive deficits, and ways to keep thinking skills  sharp.   Time 8   Period Weeks   Status Achieved     OT LONG TERM GOAL #4   Title Pt will demonstrate improved bilateral UE functional use as evidenced by increasing bilateral box/ blocks by 4 blocks.   Baseline RUE 38 blocks, LUE 42 blocks   Status On-going               Plan - 09/03/15 1256    Clinical Impression Statement Pt demonstrates understanding of PWR!moves in  prone, yet he may benefit from reinforcement.   Rehab Potential Good   OT Frequency 2x / week   OT Duration 8 weeks   OT Treatment/Interventions Self-care/ADL training;Therapeutic exercise;Patient/family education;Balance training;Splinting;Manual Therapy;Neuromuscular education;Ultrasound;Therapeutic exercises;Therapeutic activities;DME and/or AE instruction;[place]rafin;Cryotherapy;Cognitive remediation/compensation;Visual/perceptual remediation/compensation;Passive range of motion;Contrast Bath;Moist Heat;Fluidtherapy;Energy conservation;Functional Mobility Training   Plan check goals and d/c next visit   OT Home Exercise Plan issued PWR! basic 4 supine, coordination HEP, handwriting strategies, PWR! hands, memory strategies/ keeping thinking skills sharp, prone PWR! basic 4   Consulted and Agree with Plan of Care Patient;Family member/caregiver      Patient will benefit from skilled therapeutic intervention in order to improve the following deficits and impairments:  Abnormal gait, Decreased coordination, Decreased range of motion, Difficulty walking, Impaired flexibility, Impaired sensation, Increased edema, Decreased safety awareness, Decreased endurance, Decreased activity tolerance, Decreased knowledge of precautions, Impaired tone, Pain, Impaired UE functional use, Decreased knowledge of use of DME, Decreased balance, Decreased cognition, Decreased mobility, Decreased strength  Visit Diagnosis: Other symptoms and signs involving the nervous system  Other symptoms and signs involving the musculoskeletal  system  Other lack of coordination  Other abnormalities of gait and mobility  Frontal lobe and executive function deficit    Problem List Patient Active Problem List   Diagnosis Date Noted  . Acute pyelonephritis 12/16/2012  . Sepsis ([place]) 12/15/2012  . Parkinson's disease ([place]) 12/15/2012    [name] [name] 09/03/2015, 1:04 PM Theone Murdoch, OTR/L [phone] Phone: [phone] 1:04 PM 08/01/17[name] [address] [place], [place], [address] Phone: [phone]   Fax:  [phone]  Name: [name] [name] MRN: [id number] Date of Birth: 1945-12-28

## 2015-09-05 ENCOUNTER — Ambulatory Visit: Payer: Medicare Other | Admitting: Physical Therapy

## 2015-09-05 ENCOUNTER — Ambulatory Visit: Payer: Medicare Other

## 2015-09-05 ENCOUNTER — Ambulatory Visit: Payer: Medicare Other | Admitting: Occupational Therapy

## 2015-09-05 DIAGNOSIS — R278 Other lack of coordination: Secondary | ICD-10-CM | POA: Diagnosis not present

## 2015-09-05 DIAGNOSIS — R29898 Other symptoms and signs involving the musculoskeletal system: Secondary | ICD-10-CM

## 2015-09-05 DIAGNOSIS — R29818 Other symptoms and signs involving the nervous system: Secondary | ICD-10-CM | POA: Diagnosis not present

## 2015-09-05 DIAGNOSIS — R471 Dysarthria and anarthria: Secondary | ICD-10-CM

## 2015-09-05 DIAGNOSIS — R41844 Frontal lobe and executive function deficit: Secondary | ICD-10-CM

## 2015-09-05 DIAGNOSIS — R2689 Other abnormalities of gait and mobility: Secondary | ICD-10-CM

## 2015-09-05 DIAGNOSIS — R2681 Unsteadiness on feet: Secondary | ICD-10-CM

## 2015-09-05 NOTE — Therapy (Signed)
[facility] [address] [place], [place], [address] Phone: [phone]   Fax:  [phone]  Speech Language Pathology Treatment  Patient Details  Name: [name] [name] MRN: [id number] Date of Birth: 1945/06/04 No Data Recorded  Encounter Date: 09/05/2015      End of Session - 09/05/15 1725    Visit Number 11   Number of Visits 17   Date for SLP Re-Evaluation 09/27/15   SLP Start Time 1450   SLP Stop Time  1530   SLP Time Calculation (min) 40 min   Activity Tolerance Patient tolerated treatment well      Past Medical History:  Diagnosis Date  . BPH (benign prostatic hypertrophy)   . Constipation   . Depression   . Fatigue    d/t Parkinson's  . GERD (gastroesophageal reflux disease)   . History of kidney stones   . Left ureteral calculus   . Lower urinary tract symptoms (LUTS)   . Nephrolithiasis   . Orthostatic hypotension    d/t Parkinson's disease  . Parkinson's disease ([place])   . Peyronie disease   . Pneumonia   . Renal cyst, left   . S/P deep brain stimulator placement    11-29-2012  . Wears glasses   . Wears hearing aid    BILATERAL-- INTERMITTANT WEARS    Past Surgical History:  Procedure Laterality Date  . CHOLECYSTECTOMY  12/21/2014   Procedure: LAPAROSCOPIC CHOLECYSTECTOMY;  Surgeon: Ralene Ok, MD;  Location: [facility];  Service: General;;  . COLONOSCOPY    . CYSTO/  LEFT URETEROSOCPY/   LEFT URETERAL STENT PLACEMENT/  LASER BLADDER STONES AND EXTRACTION  09-25-2010  . CYSTOSCOPY WITH RETROGRADE PYELOGRAM, URETEROSCOPY AND STENT PLACEMENT Left 01/06/2013   Procedure: CYSTOSCOPY WITH RETROGRADE PYELOGRAM, URETEROSCOPY AND STENT PLACEMENT;  Surgeon: Hanley Ben, MD;  Location: [facility];  Service: Urology;  Laterality: Left;  . CYSTOSCOPY WITH RETROGRADE PYELOGRAM, URETEROSCOPY AND STENT PLACEMENT Left 09/11/2014   Procedure: CYSTOSCOPY WITH LEFT RETROGRADE PYELOGRAM,  URETEROSCOPY AND STENT PLACEMENT;  Surgeon: Lowella Bandy, MD;  Location[facility];  Service: Urology;  Laterality: Left;  . DEEP BRAIN STIMULATOR PLACEMENT  11-29-2012   genertor device at left pectoral area--- not activated yet  . EXTRACORPOREAL SHOCK WAVE LITHOTRIPSY Left 10-11-2010  &  01-05-2011  . HOLMIUM LASER APPLICATION Left 49/03/98   Procedure: HOLMIUM LASER APPLICATION;  Surgeon: Hanley Ben, MD;  Location: [facility];  Service: Urology;  Laterality: Left;  . HOLMIUM LASER APPLICATION Left 08/03/2195   Procedure: HOLMIUM LASER APPLICATION;  Surgeon: Lowella Bandy, MD;  [facility];  Service: Urology;  Laterality: Left;  . NEGATIVE SLEEP STUDY  2013  per pt  . SP PERC NEPHROSTOMY Left 12-16-2012  . STONE EXTRACTION WITH BASKET Left 01/06/2013   Procedure: STONE EXTRACTION WITH BASKET;  Surgeon: Hanley Ben, MD;  Location: [facility];  Service: Urology;  Laterality: Left;    There were no vitals filed for this visit.      Subjective Assessment - 09/05/15 1457    Patient is accompained by: Family member  wife and grandson               ADULT SLP TREATMENT - 09/05/15 1458      General Information   Behavior/Cognition Alert;Cooperative;Pleasant mood     Treatment Provided   Treatment provided Cognitive-Linquistic     Cognitive-Linquistic Treatment   Treatment focused on Dysarthria   Skilled  Treatment Pt was educated re: s/s aspiration PNA. He provided three s/s with modified independence. Loud /a/ was used today to recalibrate pt's conversational loudness with average 89dB. In sentence tasks and multisentence tasks pt maintained 70dB 88% of the time. In one-minute conversational samples with pt focus on louder speech, he consistently showed decr'd loudness after 30 seconds and averaged 68dB-69dB for the sample with greater variability in loudness during the second 30 seconds. Pt attempted to  schedule ST (PT and OT have d/c'd) but will not get in until after pt has returned from vacation. SLP and pt decided it would be best to d/c at this time with pt focusing on cont'd loud /a/ as well as language based tasks for maintaining speech loudness he has gained over this therapy course.      Assessment / Recommendations / Plan   Plan Discharge SLP treatment due to (comment)  scheduling challenges (See SLP note above)     Progression Toward Goals   Progression toward goals --  see goal summary          SLP Education - 09/05/15 1725    Education provided Yes   Education Details s/s aspiration PNA, general communicaiton strategies   Person(s) Educated Patient;Spouse   Methods Explanation;Handout   Comprehension Verbalized understanding          SLP Short Term Goals - 08/28/15 1223      SLP SHORT TERM GOAL #1   Title pt will sustain /a/ average 88dB over 3 sessions   Status Partially Met     SLP SHORT TERM GOAL #2   Title pt will  maintain 70dB loudness in 19/20 sentence responses   Status Achieved     SLP SHORT TERM GOAL #3   Title pt will tell SLP 3 s/s aspiration PNA with modified independence   Status Deferred  carry over into LTGs          SLP Long Term Goals - 09/05/15 1717      SLP LONG TERM GOAL #1   Title pt will sustsin /a/ average 89dB over 4 sessions   Status Achieved     SLP LONG TERM GOAL #2   Title pt will engage in 10 minutes mod complex conversation with average 70dB and min A rarely for loudness   Status Not Met     SLP LONG TERM GOAL #3   Title pt will report improved vocal quality in 10 minutes mod complex conversation, compared to first session ST   Status Partially Met  wife reports "This (ST) has helped him."     SLP LONG TERM GOAL #4   Title pt will tell SLP 3 s/s aspiration PNA with modified independence   Status Achieved          Plan - 09/05/15 1726    Clinical Impression Statement Pt continues to present with decreased  breath support for speech in conversation, as well as with decr'd conversational volume, necessitating min-mod SLP cues occasionally in conversation. LEvel of cueing can vary from session to session. Pt will be d/c'd at this time due to scheduling challenges (see SLP note above).   Treatment/Interventions Compensatory strategies;Patient/family education;Multimodal communcation approach;SLP instruction and feedback;Language facilitation;Functional tasks;Cueing hierarchy   Potential to Achieve Goals Good   Consulted and Agree with Plan of Care Patient;Family member/caregiver      SPEECH THERAPY DISCHARGE SUMMARY  Visits from Start of Care: 11  Current functional level related to goals / functional outcomes: See pt's goal  summary, above. Pt's long and short term goals were partially met. His loudness in conversation was variable from session to session. Generally, pt showed good awareness of decr'd loudness at end of therapy course.   Remaining deficits: Min-mod dysarthria, somewhat dependent upon fatigue level.   Education / Equipment: Compensations for louder speech, loud /a/ practice, general communication strategies (sit face to face, etc)  Plan: Patient agrees to discharge.  Patient goals were not met. Patient is being discharged due to                                                    scheduling challenges (see SLP note, above).?????       Patient will benefit from skilled therapeutic intervention in order to improve the following deficits and impairments:   Dysarthria and anarthria      G-Codes - 2015/09/23 1730    Functional Assessment Tool Used noms - 5   Functional Limitations Motor speech   Motor Speech Goal Status [phone]) At least 20 percent but less than 40 percent impaired, limited or restricted      Problem List Patient Active Problem List   Diagnosis Date Noted  . Acute pyelonephritis 12/16/2012  . Sepsis ([place]) 12/15/2012  . Parkinson's disease ([place]) 12/15/2012     [facility] ,[place], [place]  2015/09/23, 5:34 PM  [facility] [address] [place], [place], [address] Phone: [phone]   Fax:  [phone]   Name: [name] [name] MRN: [id number] Date of Birth: [birth year]

## 2015-09-05 NOTE — Therapy (Signed)
[facility] [address] [place], [place], [address] Phone: [phone]   Fax:  [phone]  Occupational Therapy Treatment  Patient Details  Name: [name] [name] MRN: [id number] Date of Birth: 1945/02/22 Referring Provider: Dr. Maxine Glenn  Encounter Date: 09/05/2015      OT End of Session - 09/05/15 1333    Visit Number 9   Number of Visits 17   Date for OT Re-Evaluation 09/14/15   Authorization Type Medicare   Authorization Time Period G code   Authorization - Visit Number 9   Authorization - Number of Visits 10   OT Start Time 1320   OT Stop Time 1400   OT Time Calculation (min) 40 min   Activity Tolerance Patient tolerated treatment well   Behavior During Therapy [facility] for tasks assessed/performed      Past Medical History:  Diagnosis Date  . BPH (benign prostatic hypertrophy)   . Constipation   . Depression   . Fatigue    d/t Parkinson's  . GERD (gastroesophageal reflux disease)   . History of kidney stones   . Left ureteral calculus   . Lower urinary tract symptoms (LUTS)   . Nephrolithiasis   . Orthostatic hypotension    d/t Parkinson's disease  . Parkinson's disease ([place])   . Peyronie disease   . Pneumonia   . Renal cyst, left   . S/P deep brain stimulator placement    11-29-2012  . Wears glasses   . Wears hearing aid    BILATERAL-- INTERMITTANT WEARS    Past Surgical History:  Procedure Laterality Date  . CHOLECYSTECTOMY  12/21/2014   Procedure: LAPAROSCOPIC CHOLECYSTECTOMY;  Surgeon: Ralene Ok, MD;  Location: [facility];  Service: General;;  . COLONOSCOPY    . CYSTO/  LEFT URETEROSOCPY/   LEFT URETERAL STENT PLACEMENT/  LASER BLADDER STONES AND EXTRACTION  09-25-2010  . CYSTOSCOPY WITH RETROGRADE PYELOGRAM, URETEROSCOPY AND STENT PLACEMENT Left 01/06/2013   Procedure: CYSTOSCOPY WITH RETROGRADE PYELOGRAM, URETEROSCOPY AND STENT PLACEMENT;  Surgeon: Hanley Ben, MD;  Location: [facility];  Service: Urology;  Laterality: Left;  . CYSTOSCOPY WITH RETROGRADE PYELOGRAM, URETEROSCOPY AND STENT PLACEMENT Left 09/11/2014   Procedure: CYSTOSCOPY WITH LEFT RETROGRADE PYELOGRAM, URETEROSCOPY AND STENT PLACEMENT;  Surgeon: Lowella Bandy, MD;  Location: [facility]  Service: Urology;  Laterality: Left;  . DEEP BRAIN STIMULATOR PLACEMENT  11-29-2012   genertor device at left pectoral area--- not activated yet  . EXTRACORPOREAL SHOCK WAVE LITHOTRIPSY Left 10-11-2010  &  01-05-2011  . HOLMIUM LASER APPLICATION Left 23/06/5730   Procedure: HOLMIUM LASER APPLICATION;  Surgeon: Hanley Ben, MD;  Location: [facility];  Service: Urology;  Laterality: Left;  . HOLMIUM LASER APPLICATION Left 2/0/2542   Procedure: HOLMIUM LASER APPLICATION;  Surgeon: Lowella Bandy, MD;  Location: [facility];  Service: Urology;  Laterality: Left;  . NEGATIVE SLEEP STUDY  2013  per pt  . SP PERC NEPHROSTOMY Left 12-16-2012  . STONE EXTRACTION WITH BASKET Left 01/06/2013   Procedure: STONE EXTRACTION WITH BASKET;  Surgeon: Hanley Ben, MD;  Location: [facility];  Service: Urology;  Laterality: Left;    There were no vitals filed for this visit.      Subjective Assessment - 09/06/15 0903    Subjective  Pt reports mild neck pain   Pertinent History see EPic, bilateral DBS   Patient Stated Goals improve speed with ADLs, improve balance   Currently in Pain? Yes  Pain Score 3    Pain Location Neck   Pain Orientation Left   Pain Descriptors / Indicators Aching   Pain Type Chronic pain   Pain Onset More than a month ago   Pain Frequency Intermittent   Aggravating Factors  malpositioning   Pain Relieving Factors reposistioning   Multiple Pain Sites No        Therapist checked progress towards goals. See below.                 [facility]) - 09/06/15 0908    PWR! Up 10   PWR! Rock 10   PWR! Twist 10   PWR! Step 10    Basic 4 Flow 10   Comments min v.c. for larger amplitude movements             OT Education - 09/06/15 0905    Education provided Yes   Education Details PWR! basic 4 prone   Person(s) Educated Patient   Methods Explanation;Demonstration;Verbal cues   Comprehension Verbalized understanding;Returned demonstration          OT Short Term Goals - 09/05/15 1323      OT SHORT TERM GOAL #1   Title I with PD specific HEP   Time 4   Status Achieved     OT SHORT TERM GOAL #2   Title Pt will verbalize understanding of adapted strategies for ADLs/IADLs, including ways to minimize freezing episodes.   Time 4   Period Weeks   Status Achieved     OT SHORT TERM GOAL #3   Title Pt will demonstrate improved ease with feeding as evidenced by decreasing PPT#2 to 11 secs or less.   Time 4   Status Not Met  13.69 secs     OT SHORT TERM GOAL #4   Title Pt will demonstrate ability to write a paragraph with 100% legibility, and no significant decrease in letter size.   Time 4   Period Weeks   Status Achieved           OT Long Term Goals - 09/05/15 1329      OT LONG TERM GOAL #1   Title Pt will verbalize understanding of ways to prevent future PD related complications and verbalize understanding of community resources.   Period Weeks   Status Achieved     OT LONG TERM GOAL #2   Title Pt will demonstrate increased ease with dressing as evidenced by decreasing 3 button/ unbutton to 35 secs or less   Baseline 39.06 secs.   Time 8   Period Weeks   Status Achieved  28.29 secs     OT LONG TERM GOAL #3   Title Pt will verbalize understanding of compenstaory strategies for cognitive deficits, and ways to keep thinking skills sharp.   Time 8   Period Weeks   Status Achieved     OT LONG TERM GOAL #4   Title Pt will demonstrate improved bilateral UE functional use as evidenced by increasing bilateral box/ blocks by 4 blocks.   Baseline RUE 38 blocks, LUE 42 blocks   Time 8    Status Achieved  RUE 48 blocks, LUE 54 blocks               Plan - 09/05/15 1334    Clinical Impression Statement Pt demonstrates good overall progress. Pt/ wife agree with plans for d/c.   Rehab Potential Good   OT Frequency 2x / week   OT Duration 8 weeks  OT Treatment/Interventions Self-care/ADL training;Therapeutic exercise;Patient/family education;Balance training;Splinting;Manual Therapy;Neuromuscular education;Ultrasound;Therapeutic exercises;Therapeutic activities;DME and/or AE instruction;Parrafin;Cryotherapy;Cognitive remediation/compensation;Visual/perceptual remediation/compensation;Passive range of motion;Contrast Bath;Moist Heat;Fluidtherapy;Energy conservation;Functional Mobility Training   Plan discharge OT   OT Home Exercise Plan issued PWR! basic 4 supine, coordination HEP, handwriting strategies, PWR! hands, memory strategies/ keeping thinking skills sharp, prone PWR! basic 4   Consulted and Agree with Plan of Care Patient;Family member/caregiver   Family Member Consulted wife, son      Patient will benefit from skilled therapeutic intervention in order to improve the following deficits and impairments:  Abnormal gait, Decreased coordination, Decreased range of motion, Difficulty walking, Impaired flexibility, Impaired sensation, Increased edema, Decreased safety awareness, Decreased endurance, Decreased activity tolerance, Decreased knowledge of precautions, Impaired tone, Pain, Impaired UE functional use, Decreased knowledge of use of DME, Decreased balance, Decreased cognition, Decreased mobility, Decreased strength  Visit Diagnosis: Other symptoms and signs involving the nervous system  Other symptoms and signs involving the musculoskeletal system  Other lack of coordination  Other abnormalities of gait and mobility  Frontal lobe and executive function deficit  Unsteadiness on feet      G-Codes - Sep 20, 2015 0907    Functional Assessment Tool Used 3  button/ unbutton: 28.29 secs, box/ blocks RUE 48 LUE 54   Functional Limitation Self care   Self Care Goal Status ([id number]) At least 1 percent but less than 20 percent impaired, limited or restricted   Self Care Discharge Status [phone]) At least 1 percent but less than 20 percent impaired, limited or restricted      Problem List Patient Active Problem List   Diagnosis Date Noted  . Acute pyelonephritis 12/16/2012  . Sepsis ([place]) 12/15/2012  . Parkinson's disease ([place]) 12/15/2012  OCCUPATIONAL THERAPY DISCHARGE SUMMARY    Current functional level related to goals / functional outcomes: Pt made excellent overall progress, see above.   Remaining deficits: Decreased coordination, bradykinesia, cognitive deficits, decreased balance,abnormal posture, freezing   Education / Equipment: Pt was educated regarding: PD specific HEP, keeping thinking skills sharp/ompensation for cognitive deficits, ways to prevent future complications and community resources. Pt/ wife verbalize understanding of all education.  Plan: Patient agrees to discharge.  Patient goals were partially met. Patient is being discharged due to meeting the stated rehab goals.  ?????      [name] [name] 09/20/2015, 9:09 AM [name] [name], OTR/L [phone] Phone: [phone] 9:09 AM 09/20/15 [facility] [address] [place], [place], [address] Phone: [phone]   Fax:  [phone]  Name: Keithan Dileonardo MRN: [id number] Date of Birth: 1945-12-31

## 2015-09-05 NOTE — Therapy (Signed)
[facility] [address] [place], [place], [address] Phone: [phone]   Fax:  [phone]  Physical Therapy Treatment  Patient Details  Name: [name] [name] MRN: [id number] Date of Birth: 03-07-1945 Referring Provider: Maxine Glenn  Encounter Date: 09/05/2015      PT End of Session - 09/05/15 1505    Visit Number 13   Number of Visits 17   Date for PT Re-Evaluation 09/15/15   Authorization Type Medicare/ BCBS-Gcode every 10th visit   PT Start Time 1403   PT Stop Time 1445   PT Time Calculation (min) 42 min   Activity Tolerance Patient tolerated treatment well   Behavior During Therapy [facility] for tasks assessed/performed      Past Medical History:  Diagnosis Date  . BPH (benign prostatic hypertrophy)   . Constipation   . Depression   . Fatigue    d/t Parkinson's  . GERD (gastroesophageal reflux disease)   . History of kidney stones   . Left ureteral calculus   . Lower urinary tract symptoms (LUTS)   . Nephrolithiasis   . Orthostatic hypotension    d/t Parkinson's disease  . Parkinson's disease ([place])   . Peyronie disease   . Pneumonia   . Renal cyst, left   . S/P deep brain stimulator placement    11-29-2012  . Wears glasses   . Wears hearing aid    BILATERAL-- INTERMITTANT WEARS    Past Surgical History:  Procedure Laterality Date  . CHOLECYSTECTOMY  12/21/2014   Procedure: LAPAROSCOPIC CHOLECYSTECTOMY;  Surgeon: Ralene Ok, MD;  Location: [facility];  Service: General;;  . COLONOSCOPY    . CYSTO/  LEFT URETEROSOCPY/   LEFT URETERAL STENT PLACEMENT/  LASER BLADDER STONES AND EXTRACTION  09-25-2010  . CYSTOSCOPY WITH RETROGRADE PYELOGRAM, URETEROSCOPY AND STENT PLACEMENT Left 01/06/2013   Procedure: CYSTOSCOPY WITH RETROGRADE PYELOGRAM, URETEROSCOPY AND STENT PLACEMENT;  Surgeon: Hanley Ben, MD;  Location: [facility];  Service: Urology;  Laterality: Left;  . CYSTOSCOPY WITH RETROGRADE  PYELOGRAM, URETEROSCOPY AND STENT PLACEMENT Left 09/11/2014   Procedure: CYSTOSCOPY WITH LEFT RETROGRADE PYELOGRAM, URETEROSCOPY AND STENT PLACEMENT;  Surgeon: Lowella Bandy, MD;  Location: [facility];  Service: Urology;  Laterality: Left;  . DEEP BRAIN STIMULATOR PLACEMENT  11-29-2012   genertor device at left pectoral area--- not activated yet  . EXTRACORPOREAL SHOCK WAVE LITHOTRIPSY Left 10-11-2010  &  01-05-2011  . HOLMIUM LASER APPLICATION Left 16/06/5372   Procedure: HOLMIUM LASER APPLICATION;  Surgeon: Hanley Ben, MD;  Location: [facility];  Service: Urology;  Laterality: Left;  . HOLMIUM LASER APPLICATION Left 09/03/7076   Procedure: HOLMIUM LASER APPLICATION;  Surgeon: Lowella Bandy, MD;  Location: [facility];  Service: Urology;  Laterality: Left;  . NEGATIVE SLEEP STUDY  2013  per pt  . SP PERC NEPHROSTOMY Left 12-16-2012  . STONE EXTRACTION WITH BASKET Left 01/06/2013   Procedure: STONE EXTRACTION WITH BASKET;  Surgeon: Hanley Ben, MD;  Location: [facility];  Service: Urology;  Laterality: Left;    There were no vitals filed for this visit.      Subjective Assessment - 09/05/15 1410    Subjective Pt reports the md at the [place] wanted him to get a quad cane but he does not feel like he needs it.  Only uses SPC on days when he feels unstable.   Patient is accompained by: Family member   Pertinent History Parkinson's disease since 2008; deep  brain stimulation R side (L brain) 2014, L side (R brain)April 2017   Patient Stated Goals Pt's goal for therapy is to improve strength, feel more comfortable that I won't fall when I'm walking.   Currently in Pain? Yes   Pain Score 3    Pain Location Neck   Pain Orientation Left   Pain Descriptors / Indicators Aching   Pain Type Chronic pain   Pain Onset More than a month ago   Pain Frequency Intermittent   Aggravating Factors  malpositioning   Pain Relieving Factors repositioning               [facility] Adult PT Treatment/Exercise - 09/05/15 0001      Transfers   Transfers Sit to Stand   Sit to Stand 7: Independent   Five time sit to stand comments  12.50 seconds  no posterior lean   Stand to Sit 7: Independent   Comments Round robin turning training with 4 chairs, with cues for large step     Ambulation/Gait   Ambulation/Gait Yes   Ambulation/Gait Assistance 5: Supervision   Ambulation/Gait Assistance Details Pt reports the md at the [place] wanted him to have a [facility] and that he has an appointment at [facility] for PT to train on [facility] next week.  Pt with difficulty processing SBQC and unable to perform step thru if uses [facility] in proper pattern.  Feel pt's gait improved with SPC or 4 tip cane and allows for more normal pattern and able to encorporate arm swing.  No LOB with gait with SPC.   Ambulation Distance (Feet) 220 Feet  twice plus through out clinic   Assistive device Straight cane;Small based quad cane;Other (Comment)  carex 4 tip cane   Gait Pattern Step-through pattern;Decreased arm swing - left;Decreased step length - right;Decreased step length - left;Decreased trunk rotation   Ambulation Surface Level;Indoor   Gait velocity 3.97 ft/sec (8.26 sec)     Standardized Balance Assessment   Standardized Balance Assessment Timed Up and Go Test     Timed Up and Go Test   TUG Normal TUG;Manual TUG   Normal TUG (seconds) 9.41   Manual TUG (seconds) 10.22     Self-Care   Self-Care Other Self-Care Comments   Other Self-Care Comments  Reviewed techniques to reduce freezing.  Discussed ways to assist with standing balance/comfort when pt in static standing.  Pt reports he has difficulty waiting in line sometimes because he feels like hes moving internally.  Gave information on [employer] Event on 9/23 and reviewed POP and community fitness options.                PT Education - 09/05/15 1503    Education provided Yes   Education Details POP, community  fitness options, hamil kerr event, reducing freezing, screen in 6 months, static standing techniques   Person(s) Educated Patient;Spouse   Methods Explanation;Demonstration;Handout   Comprehension Verbalized understanding          PT Short Term Goals - 08/16/15 1105      PT SHORT TERM GOAL #1   Title Pt will perform HEP for improved balance, transfers, gait and posture/decreased pain.  TARGET 08/15/15   Time 4   Period Weeks   Status Achieved     PT SHORT TERM GOAL #2   Title Pt will improve 5x sit<>stand to less than or equal to 16 seconds for improved transfer efficiency and safety.   Time 4   Period Weeks  Status Not Met     PT SHORT TERM GOAL #3   Title Pt will improve TUG score to less than or equal to 13.5 seconds for decreased fall risk.   Status Not Met     PT SHORT TERM GOAL #4   Title Pt will improve Functional Gait Assessment to at least 18/30 for decreased fall risk.   Time 4   Period Weeks   Status Achieved     PT SHORT TERM GOAL #5   Title Pt/family will verbalize understanding of local Parkinson's disease resources.   Period Weeks   Status On-going           PT Long Term Goals - 09-08-2015 1504      PT LONG TERM GOAL #1   Title Pt will verbalize/demonstrate understanding of tips to reduce freezing with gait and turns.  TARGET 09/16/15   Time 8   Period Weeks   Status Achieved     PT LONG TERM GOAL #2   Title Pt will improve 5x sit<>stand to less than or equal to 14 secondsf or improved efficiency and safety with transfers.   Baseline 12.5 seconds with no posterior lean   Time 8   Period Weeks   Status Achieved     PT LONG TERM GOAL #3   Title Pt will improve TUG manual to less than or equal to 15 seconds for improved dual tasking with gait.   Baseline 9.41 sec Normal and 10.22 manual on 09-08-15   Time 8   Period Weeks   Status Achieved     PT LONG TERM GOAL #4   Title PT will improve Fucntional Gait assessment to at least 20/30 for  decreased fall risk.   Baseline Improved to 27/30   Time 8   Period Weeks   Status Achieved     PT LONG TERM GOAL #5   Title Pt will improve gait velocity to at elast 2.62 ft/sec for imrpoved gait efficiency and safety.   Baseline 3.97 on 09-08-15 no device   Time 8   Period Weeks   Status Achieved     PT LONG TERM GOAL #6   Title Pt will verbalize plans for continued community fitness upon D/C from PT.   Time 8   Period Weeks   Status Achieved     PT LONG TERM GOAL #7   Title Pt/family will verbalize understanding of local Parkinson's disease resources.   Time 8   Period Weeks   Status Achieved               Plan - September 08, 2015 1506    Clinical Impression Statement Pt has met all LTG's and feels prepared for d/c.  Continue to be challenged at home with freezing but is able to verbalize and initiate strategies to reduce freezing.  Discharge from PT per POC and Mady Haagensen, PT.   PT Next Visit Plan Discharge per Mady Haagensen, PT   Consulted and Agree with Plan of Care Patient;Family member/caregiver   Family Member Consulted wife      Patient will benefit from skilled therapeutic intervention in order to improve the following deficits and impairments:     Visit Diagnosis: Other symptoms and signs involving the nervous system  Other symptoms and signs involving the musculoskeletal system  Other lack of coordination  Other abnormalities of gait and mobility       G-Codes - 08-Sep-2015 1507    Functional Assessment Tool Used gait velocity 3.97 ft/sec  no device, TUG normal 9.41sec, manual 10.22 sec, 5x sit<>stand 12.5 sec      Problem List Patient Active Problem List   Diagnosis Date Noted  . Acute pyelonephritis 12/16/2012  . Sepsis ([place]) 12/15/2012  . Parkinson's disease ([place]) 12/15/2012    Narda Bonds 2015-09-30, 3:08 PM  [facility] [address] [place], [place], [address] Phone:  [phone]   Fax:  [phone]  Name: Jailen Coward MRN: [id number] Date of Birth: 14-Jan-1946   Einar Grad [place] Sep 30, 2015 3:09 PM Phone: [phone] Fax: [phone]       G-Codes - Sep 30, 2015 1507    Functional Assessment Tool Used gait velocity 3.97 ft/sec no device, TUG normal 9.41sec, manual 10.22 sec, 5x sit<>stand 12.5 sec   Functional Limitation Mobility: Walking and moving around   Mobility: Walking and Moving Around Goal Status [id number]) At least 20 percent but less than 40 percent impaired, limited or restricted   Mobility: Walking and Moving Around Discharge Status [phone]) At least 20 percent but less than 40 percent impaired, limited or restricted    PHYSICAL THERAPY DISCHARGE SUMMARY  Visits from Start of Care: 13  Current functional level related to goals / functional outcomes:     PT Long Term Goals - Sep 30, 2015 1504      PT LONG TERM GOAL #1   Title Pt will verbalize/demonstrate understanding of tips to reduce freezing with gait and turns.  TARGET 09/16/15   Time 8   Period Weeks   Status Achieved     PT LONG TERM GOAL #2   Title Pt will improve 5x sit<>stand to less than or equal to 14 secondsf or improved efficiency and safety with transfers.   Baseline 12.5 seconds with no posterior lean   Time 8   Period Weeks   Status Achieved     PT LONG TERM GOAL #3   Title Pt will improve TUG manual to less than or equal to 15 seconds for improved dual tasking with gait.   Baseline 9.41 sec Normal and 10.22 manual on 09-30-2015   Time 8   Period Weeks   Status Achieved     PT LONG TERM GOAL #4   Title PT will improve Fucntional Gait assessment to at least 20/30 for decreased fall risk.   Baseline Improved to 27/30   Time 8   Period Weeks   Status Achieved     PT LONG TERM GOAL #5   Title Pt will improve gait velocity to at elast 2.62 ft/sec for imrpoved gait efficiency and safety.   Baseline 3.97 on Sep 30, 2015  no device   Time 8   Period Weeks   Status Achieved     PT LONG TERM GOAL #6   Title Pt will verbalize plans for continued community fitness upon D/C from PT.   Time 8   Period Weeks   Status Achieved     PT LONG TERM GOAL #7   Title Pt/family will verbalize understanding of local Parkinson's disease resources.   Time 8   Period Weeks   Status Achieved    Pt has met all LTGs.  Pt has demonstrated improved gait velocity, improved balance and transfers during course of therapy.   Remaining deficits: Continues to report difficulty in home environment with distractions, freezing episodes with turns.   Education / Equipment: Educated in [employer], fall prevention, tips to reduce freezing with gait, local PD resources, with pt verbalizing understanding.  Plan: Patient agrees to discharge.  Patient goals were met. Patient is being discharged due to meeting the stated rehab goals.  ?????Recommend PT screen in 6-9 months due to progressive nature of disease.         Mady Haagensen, PT 09/06/15 8:21 PM Phone: [phone] Fax: [phone]

## 2015-09-05 NOTE — Patient Instructions (Addendum)
Signs of Aspiration Pneumonia   . Chest pain/tightness . Fever (can be low grade) . Cough  o With foul-smelling phlegm (sputum) o With sputum containing pus or blood o With greenish sputum . Fatigue  . Shortness of breath  . Wheezing   **IF YOU HAVE THESE SIGNS, CONTACT YOUR DOCTOR OR GO TO THE EMERGENCY DEPARTMENT OR URGENT CARE AS SOON AS POSSIBLE**     ========================================== Continue to work with language tasks with your louder speech Continue loud "ah"s

## 2015-10-01 DIAGNOSIS — N401 Enlarged prostate with lower urinary tract symptoms: Secondary | ICD-10-CM | POA: Diagnosis not present

## 2015-10-01 DIAGNOSIS — R350 Frequency of micturition: Secondary | ICD-10-CM | POA: Diagnosis not present

## 2015-11-14 DIAGNOSIS — R350 Frequency of micturition: Secondary | ICD-10-CM | POA: Diagnosis not present

## 2015-11-14 DIAGNOSIS — N401 Enlarged prostate with lower urinary tract symptoms: Secondary | ICD-10-CM | POA: Diagnosis not present

## 2015-11-14 DIAGNOSIS — R3915 Urgency of urination: Secondary | ICD-10-CM | POA: Diagnosis not present

## 2015-12-31 DIAGNOSIS — G4752 REM sleep behavior disorder: Secondary | ICD-10-CM | POA: Diagnosis not present

## 2015-12-31 DIAGNOSIS — G903 Multi-system degeneration of the autonomic nervous system: Secondary | ICD-10-CM | POA: Diagnosis not present

## 2015-12-31 DIAGNOSIS — Z23 Encounter for immunization: Secondary | ICD-10-CM | POA: Diagnosis not present

## 2015-12-31 DIAGNOSIS — Z9689 Presence of other specified functional implants: Secondary | ICD-10-CM | POA: Diagnosis not present

## 2015-12-31 DIAGNOSIS — G20 Parkinson's disease: Secondary | ICD-10-CM | POA: Diagnosis not present

## 2016-02-03 DIAGNOSIS — J189 Pneumonia, unspecified organism: Secondary | ICD-10-CM

## 2016-02-03 HISTORY — DX: Pneumonia, unspecified organism: J18.9

## 2016-02-13 DIAGNOSIS — R262 Difficulty in walking, not elsewhere classified: Secondary | ICD-10-CM | POA: Diagnosis not present

## 2016-02-13 DIAGNOSIS — G20 Parkinson's disease: Secondary | ICD-10-CM | POA: Diagnosis not present

## 2016-02-13 DIAGNOSIS — R471 Dysarthria and anarthria: Secondary | ICD-10-CM | POA: Diagnosis not present

## 2016-02-27 DIAGNOSIS — H524 Presbyopia: Secondary | ICD-10-CM | POA: Diagnosis not present

## 2016-02-27 DIAGNOSIS — H04123 Dry eye syndrome of bilateral lacrimal glands: Secondary | ICD-10-CM | POA: Diagnosis not present

## 2016-02-27 DIAGNOSIS — H43393 Other vitreous opacities, bilateral: Secondary | ICD-10-CM | POA: Diagnosis not present

## 2016-02-27 DIAGNOSIS — H52223 Regular astigmatism, bilateral: Secondary | ICD-10-CM | POA: Diagnosis not present

## 2016-02-27 DIAGNOSIS — H353131 Nonexudative age-related macular degeneration, bilateral, early dry stage: Secondary | ICD-10-CM | POA: Diagnosis not present

## 2016-03-10 ENCOUNTER — Ambulatory Visit: Payer: Medicare Other | Attending: Family Medicine

## 2016-03-10 ENCOUNTER — Ambulatory Visit: Payer: Medicare Other | Admitting: Occupational Therapy

## 2016-03-10 ENCOUNTER — Ambulatory Visit: Payer: Medicare Other | Admitting: Physical Therapy

## 2016-03-10 DIAGNOSIS — R471 Dysarthria and anarthria: Secondary | ICD-10-CM

## 2016-03-10 DIAGNOSIS — R2689 Other abnormalities of gait and mobility: Secondary | ICD-10-CM | POA: Insufficient documentation

## 2016-03-10 DIAGNOSIS — R29818 Other symptoms and signs involving the nervous system: Secondary | ICD-10-CM | POA: Insufficient documentation

## 2016-03-10 NOTE — Therapy (Signed)
[facility] [address] [place], [place], [address] Phone: [phone]   Fax:  [phone]  Occupational Therapy Treatment  Patient Details  Name: [name] [name] MRN: [id number] Date of Birth: [birth year] Referring Provider: Dr. Maxine Glenn  Encounter Date: 03/10/2016    Past Medical History:  Diagnosis Date  . BPH (benign prostatic hypertrophy)   . Constipation   . Depression   . Fatigue    d/t Parkinson's  . GERD (gastroesophageal reflux disease)   . History of kidney stones   . Left ureteral calculus   . Lower urinary tract symptoms (LUTS)   . Nephrolithiasis   . Orthostatic hypotension    d/t Parkinson's disease  . Parkinson's disease ([place])   . Peyronie disease   . Pneumonia   . Renal cyst, left   . S/P deep brain stimulator placement    11-29-2012  . Wears glasses   . Wears hearing aid    BILATERAL-- INTERMITTANT WEARS    Past Surgical History:  Procedure Laterality Date  . CHOLECYSTECTOMY  12/21/2014   Procedure: LAPAROSCOPIC CHOLECYSTECTOMY;  Surgeon: Ralene Ok, MD;  Location: [facility];  Service: General;;  . COLONOSCOPY    . CYSTO/  LEFT URETEROSOCPY/   LEFT URETERAL STENT PLACEMENT/  LASER BLADDER STONES AND EXTRACTION  09-25-2010  . CYSTOSCOPY WITH RETROGRADE PYELOGRAM, URETEROSCOPY AND STENT PLACEMENT Left 01/06/2013   Procedure: CYSTOSCOPY WITH RETROGRADE PYELOGRAM, URETEROSCOPY AND STENT PLACEMENT;  Surgeon: Hanley Ben, MD;  Location: [facility];  Service: Urology;  Laterality: Left;  . CYSTOSCOPY WITH RETROGRADE PYELOGRAM, URETEROSCOPY AND STENT PLACEMENT Left 09/11/2014   Procedure: CYSTOSCOPY WITH LEFT RETROGRADE PYELOGRAM, URETEROSCOPY AND STENT PLACEMENT;  Surgeon: Lowella Bandy, MD;  Location: [facility];  Service: Urology;  Laterality: Left;  . DEEP BRAIN STIMULATOR PLACEMENT  11-29-2012   genertor device at left pectoral area--- not activated yet  .  EXTRACORPOREAL SHOCK WAVE LITHOTRIPSY Left 10-11-2010  &  01-05-2011  . HOLMIUM LASER APPLICATION Left [id number]   Procedure: HOLMIUM LASER APPLICATION;  Surgeon: Hanley Ben, MD;  Location: [facility];  Service: Urology;  Laterality: Left;  . HOLMIUM LASER APPLICATION Left [id number]   Procedure: HOLMIUM LASER APPLICATION;  Surgeon: Lowella Bandy, MD;  Location: [facility];  Service: Urology;  Laterality: Left;  . NEGATIVE SLEEP STUDY  2013  per pt  . SP PERC NEPHROSTOMY Left 12-16-2012  . STONE EXTRACTION WITH BASKET Left 01/06/2013   Procedure: STONE EXTRACTION WITH BASKET;  Surgeon: Hanley Ben, MD;  Location: [facility];  Service: Urology;  Laterality: Left;    There were no vitals filed for this visit.                     Occupational Therapy Parkinson's Disease Screen  Hand dominance:  right   Physical Performance Test item #2 (simulated eating):  13.03 sec  Physical Performance Test item #4 (donning/doffing jacket):  13.63 sec  Fastening/unfastening 3 buttons in:  25.97 sec  9-hole peg test:    RUE  34.85 secs   LUE 29.65   sec  Change in ability to perform ADLs/IADLs:  Pt denies changes.   Pt does not require occupational therapy services at this time.  Recommended occupational therapy screen in   6-8 months                   Visit Diagnosis: No diagnosis found.    Problem List Patient Active  Problem List   Diagnosis Date Noted  . Acute pyelonephritis 12/16/2012  . Sepsis ([place]) 12/15/2012  . Parkinson's disease ([place]) 12/15/2012    [name] [name] 03/10/2016, 2:08 PM  [facility] [address] [place], [place], [address] Phone: [phone]   Fax:  [phone]  Name: [name] [name] MRN: [id number] Date of Birth: Jun 23, 1945

## 2016-03-10 NOTE — Therapy (Signed)
[facility] [address] [place], [place], [address] Phone: [phone]   Fax:  [phone]  Patient Details  Name: [name] [name] MRN: [id number] Date of Birth: [birth year] Referring Provider:  Kenton Kingfisher  Encounter Date: 03/10/2016  Physical Therapy Parkinson's Disease Screen   Timed Up and Go test:12.68 sec (compared to 9.41 sec)  10 meter walk test: 9.43 sec (3.48 ft/sec)-compared to 3.97 ft/sec  5 time sit to stand test:12.84 sec (compared to 12.5 sec)  Patient would benefit from Physical Therapy evaluation due to slowed mobility measures and several reported falls since last bout of therapy.    Mikiah Durall W. 03/10/2016, 1:35 PM  Frazier Butt., PT  [facility] [address] [place], [place], [address] Phone: [phone]   Fax:  [phone]

## 2016-03-10 NOTE — Therapy (Signed)
[facility] [address] [place], [place], [address] Phone: [phone]   Fax:  [phone]  Patient Details  Name: [name] [name] MRN: [id number] Date of Birth: 04-29-1945 Referring Provider:  Crecencio Mc, DO  Encounter Date: 03/10/2016  Speech Therapy Parkinson's Disease Screen   Decibel Level today: 68dB  (WNL=70-72 dB) in 8 minutes conversation with sound level meter 30cm away from pt's mouth. Additionally pts is mild-mod hoarse. Pt's conversational volume has decreased since last treatment course. Pt had pockets of WNL volume but has reduced breath support which plays a negative role in intelligibility.  Pt has not experienced difficulty in swallowing warranting objective evaluation. No overt s/s aspiration PNA today.   Pt would benefit from speech-language eval for dysarthria- please order via [facility] ,[place], [place]  03/10/2016, 1:25 PM  [facility] [address] [place], [place], [address] Phone: [phone]   Fax:  [phone]

## 2016-03-28 DIAGNOSIS — G20 Parkinson's disease: Secondary | ICD-10-CM | POA: Diagnosis not present

## 2016-03-28 DIAGNOSIS — Z9689 Presence of other specified functional implants: Secondary | ICD-10-CM | POA: Diagnosis not present

## 2016-03-28 DIAGNOSIS — Z888 Allergy status to other drugs, medicaments and biological substances status: Secondary | ICD-10-CM | POA: Diagnosis not present

## 2016-03-28 DIAGNOSIS — J189 Pneumonia, unspecified organism: Secondary | ICD-10-CM | POA: Diagnosis not present

## 2016-05-25 DIAGNOSIS — Z85828 Personal history of other malignant neoplasm of skin: Secondary | ICD-10-CM | POA: Diagnosis not present

## 2016-05-25 DIAGNOSIS — D1801 Hemangioma of skin and subcutaneous tissue: Secondary | ICD-10-CM | POA: Diagnosis not present

## 2016-05-25 DIAGNOSIS — L821 Other seborrheic keratosis: Secondary | ICD-10-CM | POA: Diagnosis not present

## 2016-06-22 DIAGNOSIS — R471 Dysarthria and anarthria: Secondary | ICD-10-CM | POA: Diagnosis not present

## 2016-06-22 DIAGNOSIS — R05 Cough: Secondary | ICD-10-CM | POA: Diagnosis not present

## 2016-06-22 DIAGNOSIS — R1312 Dysphagia, oropharyngeal phase: Secondary | ICD-10-CM | POA: Diagnosis not present

## 2016-08-19 DIAGNOSIS — Z87442 Personal history of urinary calculi: Secondary | ICD-10-CM | POA: Diagnosis not present

## 2016-08-19 DIAGNOSIS — R296 Repeated falls: Secondary | ICD-10-CM | POA: Diagnosis not present

## 2016-08-19 DIAGNOSIS — R531 Weakness: Secondary | ICD-10-CM | POA: Diagnosis not present

## 2016-08-19 DIAGNOSIS — Z4549 Encounter for adjustment and management of other implanted nervous system device: Secondary | ICD-10-CM | POA: Diagnosis not present

## 2016-08-19 DIAGNOSIS — Z79899 Other long term (current) drug therapy: Secondary | ICD-10-CM | POA: Diagnosis not present

## 2016-08-19 DIAGNOSIS — R55 Syncope and collapse: Secondary | ICD-10-CM | POA: Diagnosis not present

## 2016-08-19 DIAGNOSIS — I951 Orthostatic hypotension: Secondary | ICD-10-CM | POA: Diagnosis not present

## 2016-08-19 DIAGNOSIS — G20 Parkinson's disease: Secondary | ICD-10-CM | POA: Diagnosis not present

## 2016-08-19 DIAGNOSIS — R498 Other voice and resonance disorders: Secondary | ICD-10-CM | POA: Diagnosis not present

## 2016-12-07 DIAGNOSIS — Z9689 Presence of other specified functional implants: Secondary | ICD-10-CM | POA: Diagnosis not present

## 2016-12-07 DIAGNOSIS — R498 Other voice and resonance disorders: Secondary | ICD-10-CM | POA: Diagnosis not present

## 2016-12-07 DIAGNOSIS — G20 Parkinson's disease: Secondary | ICD-10-CM | POA: Diagnosis not present

## 2016-12-07 DIAGNOSIS — Z23 Encounter for immunization: Secondary | ICD-10-CM | POA: Diagnosis not present

## 2017-03-08 ENCOUNTER — Other Ambulatory Visit: Payer: Self-pay

## 2017-03-08 ENCOUNTER — Encounter (HOSPITAL_BASED_OUTPATIENT_CLINIC_OR_DEPARTMENT_OTHER): Payer: Self-pay

## 2017-03-08 NOTE — Progress Notes (Signed)
Spoke with:  Luciana Axe NPO:  After Midnight, no gum, candy, or mints   Arrival time:  0745AM Labs: Hemoglobin AM medications:  Carbidopa-levodopa, Escitalopram, Ranitidine, Xanax if needed Pre op orders: No Ride home: [name] (wife) [phone]

## 2017-03-10 ENCOUNTER — Other Ambulatory Visit: Payer: Self-pay | Admitting: Urology

## 2017-03-10 MED ORDER — LACTATED RINGERS IV SOLN
INTRAVENOUS | Status: DC
  Administered 2017-03-11: 09:00:00 via INTRAVENOUS
  Filled 2017-03-10: qty 1000

## 2017-03-10 NOTE — H&P (Signed)
H&P  Chief Complaint: Urinary obstruction  History of Present Illness: 72 year old male with PD and significant BPH with LUTS (IPSS 28). He does not tolerate alpha blockers because of hypotension. He has been evaluated with cysto (bilobar obstruction) as well as flow rate. He desires more aggressive management in light of his symptoms and medication intolerance. We have decided on the Urolift procedure. He is aware of the procedure as well as potential risks/complications and desires to proceed.  He does not have a nickel allergyy  Past Medical History:  Diagnosis Date  . BPH (benign prostatic hypertrophy)   . 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 Constipation   . Depression   . Fatigue    d/t Parkinson's  . GERD (gastroesophageal reflux disease)   . History of kidney stones   . Left ureteral calculus   . Lower urinary tract symptoms (LUTS)   . Nephrolithiasis   . Orthostatic hypotension    d/t Parkinson's disease  . Orthostatic hypotension   . Parkinson's disease ([place])   . Peyronie disease   . Pneumonia 2018  . Renal cyst, left   . S/P deep brain stimulator placement    11-29-2012  . Sleep behavior disorder, REM   . Wears glasses   . Wears hearing aid    BILATERAL-- INTERMITTANT WEARS    Past Surgical History:  Procedure Laterality Date  . CHOLECYSTECTOMY  12/21/2014   Procedure: LAPAROSCOPIC CHOLECYSTECTOMY;  Surgeon: Ralene Ok, MD;  Location: [facility];  Service: General;;  . COLONOSCOPY    . CYSTO/  LEFT URETEROSOCPY/   LEFT URETERAL STENT PLACEMENT/  LASER BLADDER STONES AND EXTRACTION  09-25-2010  . CYSTOSCOPY WITH RETROGRADE PYELOGRAM, URETEROSCOPY AND STENT PLACEMENT Left 01/06/2013   Procedure: CYSTOSCOPY WITH RETROGRADE PYELOGRAM, URETEROSCOPY AND STENT PLACEMENT;  Surgeon: Hanley Ben, MD;  Location: [facility];  Service: Urology;  Laterality: Left;  . CYSTOSCOPY WITH RETROGRADE PYELOGRAM, URETEROSCOPY AND STENT PLACEMENT Left 09/11/2014   Procedure: CYSTOSCOPY WITH LEFT RETROGRADE PYELOGRAM, URETEROSCOPY AND STENT PLACEMENT;  Surgeon: Lowella Bandy, MD;  Location: [facility];  Service: Urology;  Laterality: Left;  . DEEP BRAIN STIMULATOR PLACEMENT  11-29-2012   genertor device at left pectoral area--- not activated yet  . EXTRACORPOREAL SHOCK WAVE LITHOTRIPSY Left 10-11-2010  &  01-05-2011  . HOLMIUM LASER APPLICATION Left 24/05/100   Procedure: HOLMIUM LASER APPLICATION;  Surgeon: Hanley Ben, MD;  Location: [facility];  Service: Urology;  Laterality: Left;  . HOLMIUM LASER APPLICATION Left 08/03/5364   Procedure: HOLMIUM LASER APPLICATION;  Surgeon: Lowella Bandy, MD;  Location: [facility];  Service: Urology;  Laterality: Left;  . NEGATIVE SLEEP STUDY  2013  per pt  . SP PERC NEPHROSTOMY Left 12-16-2012  . STONE EXTRACTION WITH BASKET Left 01/06/2013   Procedure: STONE EXTRACTION WITH BASKET;  Surgeon: Hanley Ben, MD;  Location: [facility];  Service: Urology;  Laterality: Left;    Home Medications:  Allergies as of 03/10/2017      Reactions   Rivastigmine Tartrate Other (See Comments)    Vomiting   Tamsulosin Other (See Comments)   Severe joint pain      Medication List    Notice   Cannot display discharge medications because the patient has not yet been admitted.     Allergies:  Allergies  Allergen Reactions  . Rivastigmine Tartrate Other (See Comments)     Vomiting  . Tamsulosin Other (See Comments)  Severe joint pain    History reviewed. No pertinent family history.  Social History:  reports that  has never smoked. he has never used smokeless tobacco. He reports that he drinks alcohol. He reports that he does not use drugs.  ROS: A complete review of systems was performed.  All systems are negative except for pertinent findings as noted.  Physical Exam:  Vital signs in last 24 hours:   Constitutional:  Alert and oriented, No acute  distress Cardiovascular: Regular rate and rhythm, No JVD Respiratory: Normal respiratory effort, Lungs clear bilaterally GI: Abdomen is soft, nontender, nondistended, no abdominal masses Genitourinary: No CVAT. Normal male phallus, testes are descended bilaterally and non-tender and without masses, scrotum is normal in appearance without lesions or masses, perineum is normal on inspection. Rectal: Normal sphincter tone, no rectal masses, prostate is non tender and without nodularity. Prostate size is estimated to be  60 cc Lymphatic: No lymphadenopathy Neurologic: Grossly intact, no focal deficits Psychiatric: Normal mood and affect  Laboratory Data:  No results for input(s): WBC, HGB, HCT, PLT in the last 72 hours.  No results for input(s): NA, K, CL, GLUCOSE, BUN, CALCIUM, CREATININE in the last 72 hours.  Invalid input(s): CO3   No results found for this or any previous visit (from the past 24 hour(s)). No results found for this or any previous visit (from the past 240 hour(s)).  Renal Function: No results for input(s): CREATININE in the last 168 hours. CrCl cannot be calculated (Patient's most recent lab result is older than the maximum 21 days allowed.).  Radiologic Imaging: No results found.  Impression/Assessment:  BPH with significant symptomatology, intolerant of medical therapy (PD)  Plan:  Urolift

## 2017-03-11 ENCOUNTER — Encounter (HOSPITAL_BASED_OUTPATIENT_CLINIC_OR_DEPARTMENT_OTHER)
Admission: RE | Disposition: A | Discharge status: Home / Self Care | Payer: Self-pay | Source: Ambulatory Visit | Attending: Urology

## 2017-03-11 ENCOUNTER — Encounter (HOSPITAL_BASED_OUTPATIENT_CLINIC_OR_DEPARTMENT_OTHER): Payer: Self-pay | Admitting: *Deleted

## 2017-03-11 ENCOUNTER — Ambulatory Visit (HOSPITAL_BASED_OUTPATIENT_CLINIC_OR_DEPARTMENT_OTHER): Payer: Medicare Other | Admitting: Anesthesiology

## 2017-03-11 ENCOUNTER — Ambulatory Visit (HOSPITAL_BASED_OUTPATIENT_CLINIC_OR_DEPARTMENT_OTHER)
Admission: RE | Admit: 2017-03-11 | Discharge: 2017-03-11 | Disposition: A | Discharge status: Home / Self Care | Payer: Medicare Other | Source: Ambulatory Visit | Attending: Urology | Admitting: Urology

## 2017-03-11 DIAGNOSIS — Z85828 Personal history of other malignant neoplasm of skin: Secondary | ICD-10-CM | POA: Diagnosis not present

## 2017-03-11 DIAGNOSIS — Z87442 Personal history of urinary calculi: Secondary | ICD-10-CM | POA: Insufficient documentation

## 2017-03-11 DIAGNOSIS — N138 Other obstructive and reflux uropathy: Secondary | ICD-10-CM | POA: Insufficient documentation

## 2017-03-11 DIAGNOSIS — F329 Major depressive disorder, single episode, unspecified: Secondary | ICD-10-CM | POA: Diagnosis not present

## 2017-03-11 DIAGNOSIS — N210 Calculus in bladder: Secondary | ICD-10-CM | POA: Diagnosis not present

## 2017-03-11 DIAGNOSIS — N3289 Other specified disorders of bladder: Secondary | ICD-10-CM | POA: Insufficient documentation

## 2017-03-11 DIAGNOSIS — Z969 Presence of functional implant, unspecified: Secondary | ICD-10-CM | POA: Diagnosis not present

## 2017-03-11 DIAGNOSIS — G20 Parkinson's disease: Secondary | ICD-10-CM | POA: Diagnosis not present

## 2017-03-11 DIAGNOSIS — N401 Enlarged prostate with lower urinary tract symptoms: Secondary | ICD-10-CM | POA: Diagnosis not present

## 2017-03-11 DIAGNOSIS — Z79899 Other long term (current) drug therapy: Secondary | ICD-10-CM | POA: Insufficient documentation

## 2017-03-11 DIAGNOSIS — K219 Gastro-esophageal reflux disease without esophagitis: Secondary | ICD-10-CM | POA: Insufficient documentation

## 2017-03-11 HISTORY — DX: REM sleep behavior disorder: G47.52

## 2017-03-11 HISTORY — PX: CYSTOSCOPY WITH INSERTION OF UROLIFT: SHX6678

## 2017-03-11 HISTORY — DX: Malignant (primary) neoplasm, unspecified: C80.1

## 2017-03-11 HISTORY — PX: HOLMIUM LASER APPLICATION: SHX5852

## 2017-03-11 LAB — POCT HEMOGLOBIN-HEMACUE: HEMOGLOBIN: 14.9 g/dL (ref 13.0–17.0)

## 2017-03-11 SURGERY — CYSTOSCOPY WITH INSERTION OF UROLIFT
Anesthesia: General | Site: Bladder

## 2017-03-11 MED ORDER — LIDOCAINE 2% (20 MG/ML) 5 ML SYRINGE
INTRAMUSCULAR | Status: AC
  Filled 2017-03-11: qty 5

## 2017-03-11 MED ORDER — CEPHALEXIN 500 MG PO CAPS: 500.0000 mg | ORAL_CAPSULE | Freq: Two times a day (BID) | ORAL | 0 refills | Status: DC

## 2017-03-11 MED ORDER — ONDANSETRON HCL 4 MG/2ML IJ SOLN
4.0000 mg | Freq: Once | INTRAMUSCULAR | Status: DC | PRN
  Filled 2017-03-11: qty 2

## 2017-03-11 MED ORDER — ONDANSETRON HCL 4 MG/2ML IJ SOLN
INTRAMUSCULAR | Status: DC | PRN
  Administered 2017-03-11: 4 mg via INTRAVENOUS

## 2017-03-11 MED ORDER — DEXAMETHASONE SODIUM PHOSPHATE 10 MG/ML IJ SOLN
INTRAMUSCULAR | Status: AC
  Filled 2017-03-11: qty 1

## 2017-03-11 MED ORDER — PROPOFOL 10 MG/ML IV BOLUS
INTRAVENOUS | Status: AC
  Filled 2017-03-11: qty 20

## 2017-03-11 MED ORDER — FENTANYL CITRATE (PF) 100 MCG/2ML IJ SOLN
25.0000 ug | INTRAMUSCULAR | Status: DC | PRN
  Administered 2017-03-11 (×2): 25 ug via INTRAVENOUS
  Filled 2017-03-11: qty 1

## 2017-03-11 MED ORDER — FENTANYL CITRATE (PF) 100 MCG/2ML IJ SOLN
INTRAMUSCULAR | Status: AC
Start: 2017-03-11 — End: ?
  Filled 2017-03-11: qty 2

## 2017-03-11 MED ORDER — LIDOCAINE 2% (20 MG/ML) 5 ML SYRINGE
INTRAMUSCULAR | Status: DC | PRN
  Administered 2017-03-11: 80 mg via INTRAVENOUS

## 2017-03-11 MED ORDER — STERILE WATER FOR IRRIGATION IR SOLN
Status: DC | PRN
  Administered 2017-03-11: 3000 mL

## 2017-03-11 MED ORDER — FENTANYL CITRATE (PF) 100 MCG/2ML IJ SOLN
INTRAMUSCULAR | Status: AC
  Filled 2017-03-11: qty 2

## 2017-03-11 MED ORDER — PROPOFOL 10 MG/ML IV BOLUS
INTRAVENOUS | Status: DC | PRN
  Administered 2017-03-11: 150 mg via INTRAVENOUS

## 2017-03-11 MED ORDER — CEFAZOLIN SODIUM-DEXTROSE 2-4 GM/100ML-% IV SOLN
2.0000 g | INTRAVENOUS | Status: AC
  Administered 2017-03-11: 2 g via INTRAVENOUS
  Filled 2017-03-11: qty 100

## 2017-03-11 MED ORDER — DEXAMETHASONE SODIUM PHOSPHATE 4 MG/ML IJ SOLN
INTRAMUSCULAR | Status: DC | PRN
  Administered 2017-03-11: 5 mg via INTRAVENOUS

## 2017-03-11 MED ORDER — CEFAZOLIN SODIUM-DEXTROSE 2-4 GM/100ML-% IV SOLN
INTRAVENOUS | Status: AC
  Filled 2017-03-11: qty 100

## 2017-03-11 MED ORDER — ONDANSETRON HCL 4 MG/2ML IJ SOLN
INTRAMUSCULAR | Status: AC
  Filled 2017-03-11: qty 2

## 2017-03-11 MED ORDER — FENTANYL CITRATE (PF) 100 MCG/2ML IJ SOLN
INTRAMUSCULAR | Status: DC | PRN
  Administered 2017-03-11: 25 ug via INTRAVENOUS

## 2017-03-11 SURGICAL SUPPLY — 22 items
BAG DRAIN URO-CYSTO SKYTR STRL (DRAIN) ×2 IMPLANT
BAG DRN ANRFLXCHMBR STRAP LEK (BAG) ×1
BAG DRN UROCATH (DRAIN) ×1
BAG URINE DRAINAGE (UROLOGICAL SUPPLIES) ×4 IMPLANT
BAG URINE LEG 19OZ MD ST LTX (BAG) ×2 IMPLANT
BAG URINE LEG 500ML (DRAIN) ×2 IMPLANT
CATH FOLEY 2WAY SLVR  5CC 18FR (CATHETERS) ×1
CATH FOLEY 2WAY SLVR 5CC 18FR (CATHETERS) ×1 IMPLANT
CLOTH BEACON ORANGE TIMEOUT ST (SAFETY) ×2 IMPLANT
FIBER LASER FLEXIVA 365 (UROLOGICAL SUPPLIES) ×2 IMPLANT
GLOVE BIO SURGEON STRL SZ8 (GLOVE) ×2 IMPLANT
GLOVE BIOGEL PI IND STRL 6.5 (GLOVE) ×1 IMPLANT
GLOVE BIOGEL PI IND STRL 7.0 (GLOVE) ×1 IMPLANT
GLOVE BIOGEL PI INDICATOR 6.5 (GLOVE) ×1
GLOVE BIOGEL PI INDICATOR 7.0 (GLOVE) ×1
GOWN STRL REUS W/TWL XL LVL3 (GOWN DISPOSABLE) ×2 IMPLANT
MANIFOLD NEPTUNE II (INSTRUMENTS) ×2 IMPLANT
PACK CYSTO (CUSTOM PROCEDURE TRAY) ×2 IMPLANT
SYRINGE IRR TOOMEY STRL 70CC (SYRINGE) ×2 IMPLANT
SYSTEM UROLIFT (Male Continence) ×14 IMPLANT
TUBE CONNECTING 12X1/4 (SUCTIONS) ×2 IMPLANT
WATER STERILE IRR 3000ML UROMA (IV SOLUTION) ×4 IMPLANT

## 2017-03-11 NOTE — Anesthesia Postprocedure Evaluation (Signed)
Anesthesia Post Note  Patient: Noah Lembke  Procedure(s) Performed: CYSTOSCOPY WITH INSERTION OF UROLIFT, LITHOPAXY,STONE OBTAINED (N/A Bladder) HOLMIUM LASER APPLICATION (N/A Bladder)     Patient location during evaluation: PACU Anesthesia Type: General Level of consciousness: awake and alert Pain management: pain level controlled Vital Signs Assessment: post-procedure vital signs reviewed and stable Respiratory status: spontaneous breathing, nonlabored ventilation, respiratory function stable and patient connected to nasal cannula oxygen Cardiovascular status: blood pressure returned to baseline and stable Postop Assessment: no apparent nausea or vomiting Anesthetic complications: no    Last Vitals:  Vitals:   03/11/17 1115 03/11/17 1116  BP:    Pulse: 70 70  Resp: 11 12  Temp:    SpO2: 99% 99%    Last Pain:  Vitals:   03/11/17 1115  TempSrc:   PainSc: 3                  Tiajuana Amass

## 2017-03-11 NOTE — Interval H&P Note (Signed)
History and Physical Interval Note:  03/11/2017 9:12 AM  [name] [name]  has presented today for surgery, with the diagnosis of BENIGN PROSTATIC HYPERPLASIA  The various methods of treatment have been discussed with the patient and family. After consideration of risks, benefits and other options for treatment, the patient has consented to  Procedure(s): CYSTOSCOPY WITH INSERTION OF UROLIFT (N/A) as a surgical intervention .  The patient's history has been reviewed, patient examined, no change in status, stable for surgery.  I have reviewed the patient's chart and labs.  Questions were answered to the patient's satisfaction.     Lillette Boxer Jashley Yellin

## 2017-03-11 NOTE — Anesthesia Procedure Notes (Signed)
Procedure Name: LMA Insertion Date/Time: 03/11/2017 9:43 AM Performed by: Suzette Battiest, MD Pre-anesthesia Checklist: Patient identified, Emergency Drugs available, Suction available and Patient being monitored Patient Re-evaluated:Patient Re-evaluated prior to induction Oxygen Delivery Method: Circle system utilized Preoxygenation: Pre-oxygenation with 100% oxygen Induction Type: IV induction Ventilation: Mask ventilation without difficulty LMA: LMA inserted LMA Size: 5.0 Number of attempts: 1 Airway Equipment and Method: Bite block Placement Confirmation: positive ETCO2 Tube secured with: Tape Dental Injury: Teeth and Oropharynx as per pre-operative assessment

## 2017-03-11 NOTE — Transfer of Care (Signed)
  Last Vitals:  Vitals:   03/11/17 0752 03/11/17 1021  BP: 120/63 131/67  Pulse: 74 61  Resp: 18 11  Temp: 36.4 C   SpO2: 96% 99%    Last Pain:  Vitals:   03/11/17 0752  TempSrc: Oral      Patients Stated Pain Goal: 2 (03/11/17 0816) Immediate Anesthesia Transfer of Care Note  Patient: [name] [name]  Procedure(s) Performed: Procedure(s) (LRB): CYSTOSCOPY WITH INSERTION OF UROLIFT, LITHOPAXY,STONE OBTAINED (N/A) HOLMIUM LASER APPLICATION (N/A)  Patient Location: PACU  Anesthesia Type: General  Level of Consciousness: awake, alert  and oriented  Airway & Oxygen Therapy: Patient Spontanous Breathing and Patient connected to nasal cannula oxygen  Post-op Assessment: Report given to PACU RN and Post -op Vital signs reviewed and stable  Post vital signs: Reviewed and stable  Complications: No apparent anesthesia complications

## 2017-03-11 NOTE — Discharge Instructions (Signed)
1.  Expect to see some bloody urethral drainage as well as blood in the initial part of your urinary stream for a few days  2.  If you are on a medicine for your prostate i.e. tamsulosin, alfuzosin, doxazosin or terazosin, it is okay to stop that 2-3 days after your procedure  3.  If you are on finasteride, it is okay to stop that immediately  4.  Limit your exertional activity for approximately 2 weeks after the procedure.  If you are having no bloody drainage or pain at that point, you may liberalize your activities  5.  Keep your follow-up appointment that is scheduled.  If you have problems before the appointment such as continued large clots in your urine, difficulty urinating or fever, contact us before at [phone]  6.  If urine is clear, it is okay to remove the catheter as instructed on Friday morning. CYSTOSCOPY HOME CARE INSTRUCTIONS  Activity: Rest for the remainder of the day.  Do not drive or operate equipment today.  You may resume normal activities in one to two days as instructed by your physician.   Meals: Drink plenty of liquids and eat light foods such as gelatin or soup this evening.  You may return to a normal meal plan tomorrow.  Return to Work: You may return to work in one to two days or as instructed by your physician.  Special Instructions / Symptoms: Call your physician if any of these symptoms occur:   -persistent or heavy bleeding  -large blood clots that are difficult to pass  -urine stream diminishes or stops completely  -fever equal to or higher than 101 degrees Farenheit.  -cloudy urine with a strong, foul odor  -severe pain  Females should always wipe from front to back after elimination.  You may feel some burning pain when you urinate.  This should disappear with time.  Applying moist heat to the lower abdomen or a hot tub bath may help relieve the pain. \ _______________________________________________  Post Anesthesia Home Care  Instructions  Activity: Get plenty of rest for the remainder of the day. A responsible individual must stay with you for 24 hours following the procedure.  For the next 24 hours, DO NOT: -Drive a car -Paediatric nurse -Drink alcoholic beverages -Take any medication unless instructed by your physician -Make any legal decisions or sign important papers.  Meals: Start with liquid foods such as gelatin or soup. Progress to regular foods as tolerated. Avoid greasy, spicy, heavy foods. If nausea and/or vomiting occur, drink only clear liquids until the nausea and/or vomiting subsides. Call your physician if vomiting continues.  Special Instructions/Symptoms: Your throat may feel dry or sore from the anesthesia or the breathing tube placed in your throat during surgery. If this causes discomfort, gargle with warm salt water. The discomfort should disappear within 24 hours.  If you had a scopolamine patch placed behind your ear for the management of post- operative nausea and/or vomiting:  1. The medication in the patch is effective for 72 hours, after which it should be removed.  Wrap patch in a tissue and discard in the trash. Wash hands thoroughly with soap and water. 2. You may remove the patch earlier than 72 hours if you experience unpleasant side effects which may include dry mouth, dizziness or visual disturbances. 3. Avoid touching the patch. Wash your hands with soap and water after contact with the patch.

## 2017-03-11 NOTE — Op Note (Signed)
Preoperative diagnosis: BPH with obstructive symptomatology  Postoperative diagnosis: Same, with 15 mm bladder calculus  Principal procedure: Urolift procedure, with the placement of 7 implants, cystolitholapaxy using holmium laser  Surgeon: Quran Vasco  Anesthesia: Gen. with LMA  Complications: None  Drains: 60 French Foley catheter, to bedside bag  Specimen: Stones  Estimated blood loss: Less than 25 mL  Indications: 72 year old male with obstructive symptomatology secondary to BPH, intolerant of medical therapy due to his Parkinson's.  The patient's symptoms have progressed, and he has requested further management.  Management options including TURP with resection/ablation of the prostate as well as Urolift were discussed.  The patient has chosen to have a Urolift procedure.  He has been instructed to the procedure as well as risks and complications which include but are not limited to infection, bleeding, and inadequate treatment with the Urolift procedure alone, anesthetic complications, among others.  He understands these and desires to proceed.  Findings: Using the 17 French cystoscope, urethra and bladder were inspected.  There were no urethral lesions.  Prostatic urethra was obstructed secondary to bilobar hypertrophy.  The bladder was inspected circumferentially.  This revealed 1-2+ trabeculations as well as a 15 mm bladder calculus at the bladder neck.  Description of procedure: The patient was properly identified in the holding area.  He received preoperative IV antibiotics.  He was taken to the operating room where general anesthetic was administered with the LMA.  He is placed in the dorsolithotomy position.  Genitalia and perineum were prepped and draped.  Proper timeout was performed.  A 69F cystoscope was inserted into the bladder. The cystoscopy bridge was replaced with a UroLift delivery device.The first treatment site was the patient's right side approximately 1.5cm distal  to the bladder neck. The distal tip of the delivery device was then angled laterally approximately 20 degrees at this position to compress the lateral lobe. The trigger was pulled, thereby deploying a needle containing the implant through the prostate. The needle was then retracted, allowing one end of the implant to be delivered to the capsular surface of the prostate. The implant was then tensioned to assure capsular seating and removal of slack monofilament. The device was then angled back toward midline and slowly advanced proximally until cystoscopic verification of the monofilament being centered in the delivery bay. The urethral end piece was then affixed to the monofilament thereby tailoring the size of the implant. Excess filament was then severed. The delivery device was then re-advanced into the bladder. The delivery device was then replaced with cystoscope and bridge and the implant location and opening effect was confirmed cystoscopically. The same procedure was then repeated on the left side, and 2 additional implants were delivered just proximal to the verumontanum, again one on right and one on left side of the prostate, following the same technique.  Additionally, one extra implant was placed in the mid prostatic urethra on the right, and 2 extra implants placed in the mid prostatic urethra on the left.   A final cystoscopy was conducted first to inspect the location and state of each implant and second, to confirm the presence of a continuous anterior channel was present through the prostatic urethra with irrigation flow turned off.  7 implants were delivered in total.  At this point, the small gauge cystoscope was removed.  Urethral meatus was dilated to 26 [place] with [employer] sounds.  The 24 French laser obturator was then placed in the bladder using visual guidance.  A 365 m laser  fiber was then utilized to fragment the stone layering posteriorly on the bladder wall.  Multiple  smaller fragments resulted.  These were then drained from the bladder through the scope.  Small dust like particles were evacuated using the [place] syringe.  Stone fragments were saved.  Inspection revealed normal bladder urothelium.  No further stones were seen.  The scope was then removed.  An 3 French Foley catheter was then placed, balloon filled with 10 cc of water.  This was hooked to dependent drainage.  The patient tolerated the procedure well.  He was then awakened and taken to the PACU in stable condition.  Following this, the scope was removed and a 50 French Foley catheter was placed and hooked to dependent drainage.  He was then awakened and taken to the PACU in stable condition.  He tolerated the procedure well.

## 2017-03-11 NOTE — Anesthesia Preprocedure Evaluation (Signed)
Anesthesia Evaluation  Patient identified by MRN, date of birth, ID band Patient awake    Reviewed: Allergy & Precautions, NPO status , Patient's Chart, lab work & pertinent test results  Airway Mallampati: II  TM Distance: >3 FB Neck ROM: Full    Dental  (+) Dental Advisory Given   Pulmonary neg pulmonary ROS,    breath sounds clear to auscultation       Cardiovascular negative cardio ROS   Rhythm:Regular Rate:Normal     Neuro/Psych Parkinsons. S/p deep brain stimulator    GI/Hepatic Neg liver ROS, GERD  ,  Endo/Other  negative endocrine ROS  Renal/GU negative Renal ROS     Musculoskeletal   Abdominal   Peds  Hematology negative hematology ROS (+)   Anesthesia Other Findings   Reproductive/Obstetrics                             Anesthesia Physical Anesthesia Plan  ASA: II  Anesthesia Plan: General   Post-op Pain Management:    Induction: Intravenous  PONV Risk Score and Plan: 2 and Dexamethasone, Ondansetron and Treatment may vary due to age or medical condition  Airway Management Planned: LMA  Additional Equipment:   Intra-op Plan:   Post-operative Plan: Extubation in OR  Informed Consent: I have reviewed the patients History and Physical, chart, labs and discussed the procedure including the risks, benefits and alternatives for the proposed anesthesia with the patient or authorized representative who has indicated his/her understanding and acceptance.     Plan Discussed with: CRNA  Anesthesia Plan Comments:         Anesthesia Quick Evaluation

## 2017-03-12 ENCOUNTER — Encounter (HOSPITAL_BASED_OUTPATIENT_CLINIC_OR_DEPARTMENT_OTHER): Payer: Self-pay | Admitting: Urology

## 2019-10-27 ENCOUNTER — Ambulatory Visit (INDEPENDENT_AMBULATORY_CARE_PROVIDER_SITE_OTHER): Payer: Medicare Other | Admitting: Cardiovascular Disease

## 2019-10-27 ENCOUNTER — Telehealth: Payer: Self-pay

## 2019-10-27 ENCOUNTER — Encounter: Payer: Self-pay | Admitting: Cardiovascular Disease

## 2019-10-27 ENCOUNTER — Other Ambulatory Visit: Payer: Self-pay

## 2019-10-27 DIAGNOSIS — I951 Orthostatic hypotension: Secondary | ICD-10-CM | POA: Diagnosis not present

## 2019-10-27 MED ORDER — MIDODRINE HCL 2.5 MG PO TABS: 2.5000 mg | ORAL_TABLET | Freq: Two times a day (BID) | ORAL | 0 refills | Status: DC

## 2019-10-27 MED ORDER — FLUDROCORTISONE ACETATE 0.1 MG PO TABS: 0.2000 mg | ORAL_TABLET | Freq: Every day | ORAL | 0 refills | Status: DC

## 2019-10-27 NOTE — Telephone Encounter (Signed)
Patient was seen today in clinic by Dr. Gwenlyn Found regarding orthostatic hypotension. PharmD came to speak with patient regarding this, and due to drug to drug interaction between midodrine and azilect the decision was made by (Raquel) PharmD to increase florinef to 0.2 mg instead. AVS with instructions to increase Florinef was given to patient, and appointment made in 1 month (10/15) to follow up with PharmD. Patient verbalized all instructions prior to leaving the office.

## 2019-10-27 NOTE — Assessment & Plan Note (Signed)
[name] [name] was referred to me by Dr. Louis Meckel for symptomatic orthostatic hypotension related to Parkinson's disease.  He has had Parkinson's for 12 years.  He is for the most part wheelchair-bound although he walks at home with a walker.  He is symptomatic from orthostasis.  He is on Florinef 0.1 mg once a day.  We will add Midrin and have him keep a blood pressure log.  This can be titrated as an outpatient.  He may need Northera as well.

## 2019-10-27 NOTE — Progress Notes (Signed)
10/27/2019 [name] [name]   [birth year]  [id number]  Primary Physician [place], PA-C Primary Cardiologist: [name] Harp MD [name] [name], [place]  HPI:  [name] [name] is a 74 y.o. thin appearing married Caucasian male father of 51, grandfather of 6 grandchildren is accompanied by his wife [name] [name] today.  He was referred by Dr. Louis [name], his urologist, for evaluation and treatment of orthostatic hypotension related to Parkinson's disease.  He also has urinary incontinence and some placed on Flomax however this has lowered his blood pressure.  He has no other cardiac risk factors.  Is never had a heart attack or stroke.  He denies chest pain or shortness of breath.  Is retired from working at [employer].   Current Meds  Medication Sig  . acetaminophen (TYLENOL) 500 MG tablet Take 1,000 mg by mouth every 6 (six) hours as needed. For pain.   Marland Kitchen ALPRAZolam (XANAX) 0.25 MG tablet Take 0.25 mg by mouth 2 (two) times daily as needed.   . carbidopa-levodopa (SINEMET) 25-100 MG per tablet Take 2 tablets by mouth 5 (five) times daily.   Marland Kitchen escitalopram (LEXAPRO) 20 MG tablet Take 20 mg by mouth every morning.   . rasagiline (AZILECT) 1 MG TABS tablet Take by mouth.  . triamcinolone cream (KENALOG) 0.1 % Apply 1 application topically daily as needed. For ezcema.      Allergies  Allergen Reactions  . Rivastigmine Tartrate Other (See Comments)     Vomiting  . Tamsulosin Other (See Comments)    Severe joint pain  . Alfuzosin     Other reaction(s): Other (See Comments) hypotension    Social History   Socioeconomic History  . Marital status: Married    Spouse name: Not on file  . Number of children: Not on file  . Years of education: Not on file  . Highest education level: Not on file  Occupational History  . Not on file  Tobacco Use  . Smoking status: Never Smoker  . Smokeless tobacco: Never Used  Substance and Sexual Activity  . Alcohol use: Yes    Comment: rarely beer  .  Drug use: No  . Sexual activity: Not on file  Other Topics Concern  . Not on file  Social History Narrative  . Not on file   Social Determinants of Health   Financial Resource Strain:   . Difficulty of Paying Living Expenses: Not on file  Food Insecurity:   . Worried About Charity fundraiser in the Last Year: Not on file  . Ran Out of Food in the Last Year: Not on file  Transportation Needs:   . Lack of Transportation (Medical): Not on file  . Lack of Transportation (Non-Medical): Not on file  Physical Activity:   . Days of Exercise per Week: Not on file  . Minutes of Exercise per Session: Not on file  Stress:   . Feeling of Stress : Not on file  Social Connections:   . Frequency of Communication with Friends and Family: Not on file  . Frequency of Social Gatherings with Friends and Family: Not on file  . Attends Religious Services: Not on file  . Active Member of Clubs or Organizations: Not on file  . Attends Archivist Meetings: Not on file  . Marital Status: Not on file  Intimate Partner Violence:   . Fear of Current or Ex-Partner: Not on file  . Emotionally Abused: Not on file  . Physically Abused: Not  on file  . Sexually Abused: Not on file     Review of Systems: General: negative for chills, fever, night sweats or weight changes.  Cardiovascular: negative for chest pain, dyspnea on exertion, edema, orthopnea, palpitations, paroxysmal nocturnal dyspnea or shortness of breath Dermatological: negative for rash Respiratory: negative for cough or wheezing Urologic: negative for hematuria Abdominal: negative for nausea, vomiting, diarrhea, bright red blood per rectum, melena, or hematemesis Neurologic: negative for visual changes, syncope, or dizziness All other systems reviewed and are otherwise negative except as noted above.    Blood pressure (!) 104/59, pulse 67, height 5\' 10"  (1.778 m), weight 160 lb (72.6 kg), SpO2 97 %.  General appearance: alert and  no distress Neck: no adenopathy, no carotid bruit, no JVD, supple, symmetrical, trachea midline and thyroid not enlarged, symmetric, no tenderness/mass/nodules Lungs: clear to auscultation bilaterally Heart: regular rate and rhythm, S1, S2 normal, no murmur, click, rub or gallop Extremities: extremities normal, atraumatic, no cyanosis or edema Pulses: 2+ and symmetric Skin: Skin color, texture, turgor normal. No rashes or lesions Neurologic: Alert and oriented X 3, normal strength and tone. Normal symmetric reflexes. Normal coordination and gait  EKG sinus rhythm at 67 without ST or T wave changes.  I personally reviewed this EKG.  ASSESSMENT AND PLAN:   Orthostatic hypotension Mr. Lomeli was referred to me by Dr. Louis [name] for symptomatic orthostatic hypotension related to Parkinson's disease.  He has had Parkinson's for 12 years.  He is for the most part wheelchair-bound although he walks at home with a walker.  He is symptomatic from orthostasis.  He is on Florinef 0.1 mg once a day.  We will add Midrin and have him keep a blood pressure log.  This can be titrated as an outpatient.  He may need Northera as well.      [name] Harp MD FACP,FACC,FAHA, [facility] 10/27/2019 3:04 PM

## 2019-10-27 NOTE — Patient Instructions (Addendum)
Medication Instructions:  PLEASE INCREASE FLORINEF TO 0.2mg  (2 tablets Daily) *If you need a refill on your cardiac medications before your next appointment, please call your pharmacy*  Lab Work: None Ordered At This Time.  If you have labs (blood work) drawn today and your tests are completely normal, you will receive your results only by: Marland Kitchen MyChart Message (if you have MyChart) OR . A paper copy in the mail If you have any lab test that is abnormal or we need to change your treatment, we will call you to review the results.  Testing/Procedures: None Ordered At This Time.   Follow-Up: At [facility], you and your health needs are our priority.  As part of our continuing mission to provide you with exceptional heart care, we have created designated Provider Care Teams.  These Care Teams include your primary Cardiologist (physician) and Advanced Practice Providers (APPs -  Physician Assistants and Nurse Practitioners) who all work together to provide you with the care you need, when you need it.  We recommend signing up for the patient portal called "MyChart".  Sign up information is provided on this After Visit Summary.  MyChart is used to connect with patients for Virtual Visits (Telemedicine).  Patients are able to view lab/test results, encounter notes, upcoming appointments, etc.  Non-urgent messages can be sent to your provider as well.   To learn more about what you can do with MyChart, go to [email or web address].    Your next appointment:   3 month(s)  The format for your next appointment:   In Person  Provider:   Quay Burow, MD  Other Instructions Please check your blood pressure at home daily, write it down.  Call the office or send message via Mychart with the readings in 2 weeks for Dr. Gwenlyn Found to review.  FOLLOW UP WITH PHARMD

## 2019-11-17 ENCOUNTER — Ambulatory Visit (INDEPENDENT_AMBULATORY_CARE_PROVIDER_SITE_OTHER): Payer: Medicare Other | Admitting: Pharmacist Clinician (PhC)/ Clinical Pharmacy Specialist

## 2019-11-17 ENCOUNTER — Other Ambulatory Visit: Payer: Self-pay

## 2019-11-17 DIAGNOSIS — I951 Orthostatic hypotension: Secondary | ICD-10-CM | POA: Diagnosis not present

## 2019-11-17 NOTE — Progress Notes (Signed)
11/20/2019 [name] [name] 23-Jan-1946 [id number]   HPI:  [name] [name] is a 74 y.o. male patient of Dr Gwenlyn Found,  who presents today for hypertension clinic evaluation.  He was seen by Dr. Gwenlyn Found last month and actually has more of a problem with hypotension.  In addition to this, he also has Parkinson's disease.  He was on fludrocortisone 0.1 mg daily and Dr. Gwenlyn Found increased this to 0.2 mg.  He is in the office today for follow up.   Patient is here today with his wife.  He has difficulty walking and is in a wheelchair today.  He reports that at home he gets around with a walker.  He has had a few recent syncopal spells lately, but has not hit the floor.  He is usually able to lower himself to the ground, and his walker is designed for patients with neurologic disorders and does not move unless he is actually gripping the handles.  So if he lets go of the grip the wheels lock up and he can use it to lower himself to the ground.  He notes that he will feel washed out and hear rumbling in his ears as his vision starts to blacken on the edges.  If he can get down fast enough, he doesn't necessarily pass out.  His pressure has gone up some since increasing the fludrocortisone, but still has the episodes of postural hypotension.    He was not able to try using midodrine along with the fludrocortisone, as it has an interaction with his Azilect.  The interaction, actually, is a rise in blood pressure, but since the Azilect is an MAO inhibitor, the rise in pressure can be exaggerated and unpredictable. The combination is a category X interaction.     Blood Pressure Goal:  130/80  Current Medications: fludrocortisone 0.2 mg daily  Family Hx: mother had stroke with normal BP, CHF, died at 52; father died at 45 from lung cancer; sister with RA and MI after gold injections, died at 19  Social Hx: no tobacco; no alcohol, no caffeine - associates with hypotension  Diet: home cooked, now adding salt to meals;   2-3servings of fruits and vegetables per day;  Exercise: stretch and tone class twice weekly (1 hour with weights and about 20 min cardio).  Home BP readings: 90/50 is home average - did get up to about 130 with doubling of florinef, but has dropped down sodme  100/50 standing 141/73 sitting 103/58 standing 116/60 sit 111/55 stand 119/62 to 78/49 (ginger) 114/56 to 89/50 120/62 to 94/66  Intolerances: rivastigmine, tamsulosin, alfuzosin  Labs: 7/21:  Na 136, K 4.8, SCr 0.7, GFR 92.3  Wt Readings from Last 3 Encounters:  11/17/19 160 lb (72.6 kg)  10/27/19 160 lb (72.6 kg)  03/11/17 193 lb 9.6 oz (87.8 kg)   BP Readings from Last 3 Encounters:  11/17/19 126/78  10/27/19 (!) 104/59  03/11/17 114/71   Pulse Readings from Last 3 Encounters:  11/17/19 68  10/27/19 67  03/11/17 70    Current Outpatient Medications  Medication Sig Dispense Refill   ALPRAZolam (XANAX) 0.25 MG tablet Take 0.25 mg by mouth 2 (two) times daily as needed.      carbidopa-levodopa (SINEMET CR) 50-200 MG tablet Take 1 tablet by mouth 2 (two) times daily.     carbidopa-levodopa (SINEMET) 25-100 MG per tablet Take 2 tablets by mouth 3 (three) times daily.      escitalopram (LEXAPRO) 20 MG tablet Take  20 mg by mouth every morning.      famotidine (ZANTAC 360) 10 MG tablet Take 10 mg by mouth daily as needed for heartburn or indigestion.     fludrocortisone (FLORINEF) 0.1 MG tablet Take 2 tablets (0.2 mg total) by mouth daily. 60 tablet 0   rasagiline (AZILECT) 1 MG TABS tablet Take 1 mg by mouth.      triamcinolone cream (KENALOG) 0.1 % Apply 1 application topically daily as needed. For ezcema.      No current facility-administered medications for this visit.    Allergies  Allergen Reactions   Rivastigmine Tartrate Other (See Comments)     Vomiting   Tamsulosin Other (See Comments)    Severe joint pain   Alfuzosin     Other reaction(s): Other (See Comments) hypotension    Past  Medical History:  Diagnosis Date   BPH (benign prostatic hypertrophy)    Cancer (HCC)    basal cell temple   Constipation    Depression    Fatigue    d/t Parkinson's   GERD (gastroesophageal reflux disease)    History of kidney stones    Left ureteral calculus    Lower urinary tract symptoms (LUTS)    Nephrolithiasis    Neuromuscular disorder (HCC)     parkinson's   Orthostatic hypotension    d/t Parkinson's disease   Orthostatic hypotension    Parkinson's disease ([place])    Peyronie disease    Pneumonia 2018   Renal cyst, left    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  Right arm 126/80; Upon standing BP drops to 90/64 (stood with assistance of RN and wife)  Orthostatic hypotension Patient with neurogenic orthostatic hypotension, currently on fludrocortisone 0.2 mg daily.  This has helped overall with his low blood pressures, but not as much with the positional changes.  He is unable to take midodrine, as he takes an MAO inhibitor and the combination is a category X drug interaction.  We will start the process to get Northera covered by his insurance.  He will need to start with 100 mg three times daily and we will titrate according to his blood pressure response.     Tommy Medal PharmD CPP [facility] [address] [place], [place] [address] [phone]

## 2019-11-20 ENCOUNTER — Encounter: Payer: Self-pay | Admitting: Pharmacist Clinician (PhC)/ Clinical Pharmacy Specialist

## 2019-11-20 ENCOUNTER — Telehealth: Payer: Self-pay

## 2019-11-20 NOTE — Telephone Encounter (Signed)
lmom for drug insurance pa for northera

## 2019-11-20 NOTE — Patient Instructions (Signed)
  Check your blood pressure at home daily and keep record of the readings.  Take your meds as follows:  Continue with the fludrocortisone 0.2 mg daily    We will start the process to get Northera covered by your insurance.  You will start with 1 capsule three times daily (am, noon, 4 pm) and can titrate up on dose according to your blood pressure response.  Bring all of your meds, your BP cuff and your record of home blood pressures to your next appointment.  Exercise as you're able, try to walk approximately 30 minutes per day.  Keep salt intake to a minimum, especially watch canned and prepared boxed foods.  Eat more fresh fruits and vegetables and fewer canned items.  Avoid eating in fast food restaurants.    HOW TO TAKE YOUR BLOOD PRESSURE: . Rest 5 minutes before taking your blood pressure. .  Don't smoke or drink caffeinated beverages for at least 30 minutes before. . Take your blood pressure before (not after) you eat. . Sit comfortably with your back supported and both feet on the floor (don't cross your legs). . Elevate your arm to heart level on a table or a desk. . Use the proper sized cuff. It should fit smoothly and snugly around your bare upper arm. There should be enough room to slip a fingertip under the cuff. The bottom edge of the cuff should be 1 inch above the crease of the elbow. . Ideally, take 3 measurements at one sitting and record the average.

## 2019-11-20 NOTE — Assessment & Plan Note (Signed)
Patient with neurogenic orthostatic hypotension, currently on fludrocortisone 0.2 mg daily.  This has helped overall with his low blood pressures, but not as much with the positional changes.  He is unable to take midodrine, as he takes an MAO inhibitor and the combination is a category X drug interaction.  We will start the process to get Northera covered by his insurance.  He will need to start with 100 mg three times daily and we will titrate according to his blood pressure response.

## 2019-11-23 ENCOUNTER — Other Ambulatory Visit: Payer: Self-pay | Admitting: Cardiovascular Disease

## 2019-11-28 ENCOUNTER — Telehealth: Payer: Self-pay | Admitting: Cardiovascular Disease

## 2019-11-28 ENCOUNTER — Telehealth: Payer: Self-pay | Admitting: *Deleted

## 2019-11-28 NOTE — Telephone Encounter (Signed)
Prescription sent to [facility] - they should be reaching out to patient this week

## 2019-11-28 NOTE — Telephone Encounter (Signed)
NORTHERA Capsule Type of coverage approved: Prior Authorization This approval authorizes your coverage from 08/22/2019 - 02/18/2020, unless we notify you  otherwise

## 2019-11-28 NOTE — Telephone Encounter (Signed)
**Note De-Identified Melyssa Signor Obfuscation** Medication has not been added to the pts med list and it looks like Haleigh in the [facility] is handling this PA. Will forward to her.

## 2019-11-28 NOTE — Telephone Encounter (Signed)
° ° °  Pt c/o medication issue:  1. Name of Medication: Northera  2. How are you currently taking this medication (dosage and times per day)?   3. Are you having a reaction (difficulty breathing--STAT)?   4. What is your medication issue? Clarene Critchley from [facility] calling, she said this medication was approved for quantity of 90 for 30 days but the prescription received from Dr. Gwenlyn Found is 450 capsules for 30 days. She said Dr. Gwenlyn Found needs to get an approval for that and need to call insurance. She gave phone [phone]

## 2019-11-28 NOTE — Telephone Encounter (Signed)
PA for northera 100 mg capsules has been started in cover my meds for Dr Kennon Holter patient,  Key: [name] [name] Last name: [name] [name] DOB: [birth year]

## 2020-01-10 ENCOUNTER — Telehealth: Payer: Self-pay | Admitting: Cardiovascular Disease

## 2020-01-10 NOTE — Telephone Encounter (Signed)
Talked to patient's wife. He was making some bread this weekend and spent long time standing. Arm pain may be related to holding into walker for too long.  Recommendation given:  1. HOLD fiorinef today, then resume tomorrow at 0.1mg  daily (decreased from 0.1mg  BID)  2. HOLD on potato chips for 2 days  3. Monitor BP  4. Keep legs elevated while sitting  5. Call back Friday morning if no improvement noted. Otherwise, resume fiorinef 0.1,mg BID on Monday if everything is back to normal.

## 2020-01-10 NOTE — Telephone Encounter (Signed)
Rn spoke to patient's wife. She states patient for the last  2 to 3 days - swelling in both feet. She states the right foot is always a little swollen, but the left has been swollen as well. She states that yesterday both feet were swollen so much that it was difficult for patient to walk last night.  She also states he is both feet are warm, no discoloration.  She has awaken the patient this morning. She states feet are some what puffy this morning.  She states he has been eating some salty foods  To help with blood pressure. - potato chips @ 2 days ago.  And been drinking sodas aw well.   Per wife patient is taking Florinef  0.1 mg  Twice a day . But she states he did not take a dose last nignt  Blood pressure range 135/77 , on dec 4 136/75 . Patient also complaining of soreness weakness in both arms - but they think it may be from using walker - ( it needs repair- and he has to manually maneuver it.  wife is aware will defer to pharmacist ( who saw patient last)  and Dr Gwenlyn Found

## 2020-01-10 NOTE — Telephone Encounter (Signed)
Pt c/o swelling: STAT is pt has developed SOB within 24 hours  1) How much weight have you gained and in what time span? Not sure if patient has gained weight  2) If swelling, where is the swelling located? Both Feet, arms feels week  3) Are you currently taking a fluid pill? no  4) Are you currently SOB? no  5) Do you have a log of your daily weights (if so, list)? Has not weighed him  6) Have you gained 3 pounds in a day or 5 pounds in a week? no   7) Have you traveled recently? no   Patient's wife states the patient is having swelling in both feet. She states he usually only has it in one foot. She states he is also having weakness in his arms. She states he was also put on a new BP medication to raise his BP. She states she had him hold the medication last night, hoping it would help with his swelling, but it hasn't. She states his BP is normally around 115/60, but has been running higher around 138/80's. Please advise.

## 2020-01-19 ENCOUNTER — Ambulatory Visit: Payer: Medicare Other | Admitting: Cardiovascular Disease

## 2020-03-13 ENCOUNTER — Ambulatory Visit (INDEPENDENT_AMBULATORY_CARE_PROVIDER_SITE_OTHER): Payer: Medicare Other | Admitting: Cardiovascular Disease

## 2020-03-13 ENCOUNTER — Other Ambulatory Visit: Payer: Self-pay

## 2020-03-13 ENCOUNTER — Encounter: Payer: Self-pay | Admitting: Cardiovascular Disease

## 2020-03-13 DIAGNOSIS — I951 Orthostatic hypotension: Secondary | ICD-10-CM

## 2020-03-13 NOTE — Progress Notes (Signed)
03/13/2020 [name] [name]   01-Feb-1946  [id number]  Primary Physician Perkins, PA-C Primary Cardiologist: Lorretta Harp MD FACP, [place], [place], [place]  HPI:  [name] [name] is a 75 y.o.  thin appearing married Caucasian male father of 17, grandfather of 6 grandchildren who I last saw in the office 10/27/2019. He was referred by Dr. Louis [name], his urologist, for evaluation and treatment of orthostatic hypotension related to Parkinson's disease.  He also has urinary incontinence and some placed on Flomax however this has lowered his blood pressure.  He has no other cardiac risk factors.  Is never had a heart attack or stroke.  He denies chest pain or shortness of breath.  Is retired from working at [employer]  Since I saw him 6 months ago he has seen [place], one of our Pharm.D.'s, for further evaluation and treatment.  We explored adding Midrin however there was a pharmacologic interaction with one of his other medications precluding its use.  In any event, he feels clinically improved on the Florinef.   Current Meds  Medication Sig  . ALPRAZolam (XANAX) 0.25 MG tablet Take 0.25 mg by mouth 2 (two) times daily as needed.   . carbidopa-levodopa (SINEMET) 25-100 MG per tablet Take 2 tablets by mouth 5 (five) times daily.  Marland Kitchen escitalopram (LEXAPRO) 20 MG tablet Take 20 mg by mouth every morning.   . famotidine (PEPCID) 10 MG tablet Take 10 mg by mouth daily as needed for heartburn or indigestion.  . fludrocortisone (FLORINEF) 0.1 MG tablet TAKE 2 TABLETS (0.2 MG TOTAL) BY MOUTH DAILY.  . rasagiline (AZILECT) 1 MG TABS tablet Take 1 mg by mouth.   . triamcinolone cream (KENALOG) 0.1 % Apply 1 application topically daily as needed. For ezcema.     Allergies  Allergen Reactions  . Rivastigmine Tartrate Other (See Comments)     Vomiting  . Tamsulosin Other (See Comments)    Severe joint pain  . Alfuzosin     Other reaction(s): Other (See Comments) hypotension    Social History    Socioeconomic History  . Marital status: Married    Spouse name: Not on file  . Number of children: Not on file  . Years of education: Not on file  . Highest education level: Not on file  Occupational History  . Not on file  Tobacco Use  . Smoking status: Never Smoker  . Smokeless tobacco: Never Used  Substance and Sexual Activity  . Alcohol use: Yes    Comment: rarely beer  . Drug use: No  . Sexual activity: Not on file  Other Topics Concern  . Not on file  Social History Narrative  . Not on file   Social Determinants of Health   Financial Resource Strain: Not on file  Food Insecurity: Not on file  Transportation Needs: Not on file  Physical Activity: Not on file  Stress: Not on file  Social Connections: Not on file  Intimate Partner Violence: Not on file     Review of Systems: General: negative for chills, fever, night sweats or weight changes.  Cardiovascular: negative for chest pain, dyspnea on exertion, edema, orthopnea, palpitations, paroxysmal nocturnal dyspnea or shortness of breath Dermatological: negative for rash Respiratory: negative for cough or wheezing Urologic: negative for hematuria Abdominal: negative for nausea, vomiting, diarrhea, bright red blood per rectum, melena, or hematemesis Neurologic: negative for visual changes, syncope, or dizziness All other systems reviewed and are otherwise negative except as noted above.  Blood pressure 123/72, pulse 60, height 5\' 10"  (1.778 m), weight 154 lb 6.4 oz (70 kg), SpO2 95 %.  General appearance: alert and no distress Neck: no adenopathy, no carotid bruit, no JVD, supple, symmetrical, trachea midline and thyroid not enlarged, symmetric, no tenderness/mass/nodules Lungs: clear to auscultation bilaterally Heart: regular rate and rhythm, S1, S2 normal, no murmur, click, rub or gallop Extremities: extremities normal, atraumatic, no cyanosis or edema Pulses: 2+ and symmetric Skin: Skin color, texture,  turgor normal. No rashes or lesions Neurologic: Alert and oriented X 3, normal strength and tone. Normal symmetric reflexes. Normal coordination and gait  EKG not performed today  ASSESSMENT AND PLAN:   Orthostatic hypotension [name] returns today for follow-up of orthostatic hypotension probably related to his Parkinson disease and autonomic dysfunction.  He is on Florinef.  I was having him see our Pharm.D. to discuss Midrin however there was a drug drug interaction with this that did not allow him to use it.  However, he says he feels clinically improved since I saw him 6 months ago and is less dizzy.      Lorretta Harp MD FACP,FACC,FAHA, [facility] 03/13/2020 11:36 AM

## 2020-03-13 NOTE — Patient Instructions (Signed)

## 2020-03-13 NOTE — Assessment & Plan Note (Signed)
Mr. Gratz returns today for follow-up of orthostatic hypotension probably related to his Parkinson disease and autonomic dysfunction.  He is on Florinef.  I was having him see our Pharm.D. to discuss Midrin however there was a drug drug interaction with this that did not allow him to use it.  However, he says he feels clinically improved since I saw him 6 months ago and is less dizzy.

## 2020-07-19 ENCOUNTER — Telehealth: Payer: Self-pay | Admitting: Cardiovascular Disease

## 2020-07-19 DIAGNOSIS — R079 Chest pain, unspecified: Secondary | ICD-10-CM

## 2020-07-19 NOTE — Telephone Encounter (Signed)
Spoke with pt's wife Damarion Mendizabal regarding pt's recent visit with his PCP. While at that appointment pt told his PCP that he is having mid sternal chest pain from time to time that is accompanied with reflux. PCP is requesting that the pt have a stress test.   Wife would also like to add that on the occasion pt's blood pressure will run a bit higher than normal. She states that pt's florinef was increased at last cardiology visit to twice a day, however wife chose to hold a dose when his blood pressure was 145/83.

## 2020-07-19 NOTE — Telephone Encounter (Signed)
Patient's wife called to say that the patient PCP says that the patient needs a stress test done. Please advise

## 2020-07-19 NOTE — Telephone Encounter (Signed)
Spoke with pt's wife. Orders placed for lexiscan. Explained to wife that someone from our scheduling pool would give them a call to schedule stress test. Wife verbalizes understanding.  Pt's wife states that Dr. Ralene Bathe from [facility] would like a copy of the report from [place]. Fax number is [phone].

## 2020-07-31 ENCOUNTER — Telehealth (HOSPITAL_COMMUNITY): Payer: Self-pay | Admitting: *Deleted

## 2020-07-31 NOTE — Telephone Encounter (Signed)
Close encounter 

## 2020-08-01 ENCOUNTER — Other Ambulatory Visit: Payer: Self-pay

## 2020-08-01 ENCOUNTER — Ambulatory Visit (HOSPITAL_COMMUNITY)
Admission: RE | Admit: 2020-08-01 | Discharge: 2020-08-01 | Disposition: A | Discharge status: Home / Self Care | Payer: Medicare Other | Source: Ambulatory Visit | Attending: Cardiovascular Disease | Admitting: Cardiovascular Disease

## 2020-08-06 ENCOUNTER — Encounter (HOSPITAL_COMMUNITY): Payer: Self-pay

## 2020-08-06 ENCOUNTER — Encounter (HOSPITAL_COMMUNITY): Payer: Medicare Other

## 2020-08-08 ENCOUNTER — Telehealth (HOSPITAL_COMMUNITY): Payer: Self-pay | Admitting: *Deleted

## 2020-08-08 NOTE — Telephone Encounter (Signed)
Close encounter 

## 2020-08-13 ENCOUNTER — Ambulatory Visit (HOSPITAL_COMMUNITY)
Admission: RE | Admit: 2020-08-13 | Discharge: 2020-08-13 | Disposition: A | Discharge status: Home / Self Care | Payer: Medicare Other | Source: Ambulatory Visit | Attending: Cardiovascular Disease | Admitting: Cardiovascular Disease

## 2020-08-13 ENCOUNTER — Other Ambulatory Visit: Payer: Self-pay

## 2020-08-13 DIAGNOSIS — R079 Chest pain, unspecified: Secondary | ICD-10-CM

## 2020-08-13 LAB — MYOCARDIAL PERFUSION IMAGING
LV dias vol: 109 mL (ref 62–150)
LV sys vol: 35 mL
Peak HR: 97 {beats}/min
Rest HR: 67 {beats}/min
SDS: 0
SRS: 0
SSS: 0
TID: 0.89

## 2020-08-13 MED ORDER — TECHNETIUM TC 99M TETROFOSMIN IV KIT
31.4000 | PACK | Freq: Once | INTRAVENOUS | Status: AC | PRN
  Administered 2020-08-13: 31.4 via INTRAVENOUS
  Filled 2020-08-13: qty 32

## 2020-08-13 MED ORDER — TECHNETIUM TC 99M TETROFOSMIN IV KIT
10.1000 | PACK | Freq: Once | INTRAVENOUS | Status: AC | PRN
  Administered 2020-08-13: 10.1 via INTRAVENOUS
  Filled 2020-08-13: qty 11

## 2020-08-13 MED ORDER — REGADENOSON 0.4 MG/5ML IV SOLN
0.4000 mg | Freq: Once | INTRAVENOUS | Status: AC
  Administered 2020-08-13: 0.4 mg via INTRAVENOUS

## 2020-08-19 ENCOUNTER — Telehealth: Payer: Self-pay | Admitting: Cardiovascular Disease

## 2020-08-19 NOTE — Telephone Encounter (Signed)
Follow Up:     Patient's wife wants to know if patient's Stress Test results are ready. He does not have My-Chart

## 2020-08-19 NOTE — Telephone Encounter (Signed)
Low risk non ischemic MV  [name] Harp, MD ------ Returned call to (wife) [name] [name] [facility]) informed of providers result & recommendations, verbalized understanding. No further questions .

## 2020-08-20 ENCOUNTER — Other Ambulatory Visit: Payer: Self-pay | Admitting: Urology

## 2020-08-20 ENCOUNTER — Telehealth: Payer: Self-pay | Admitting: Cardiovascular Disease

## 2020-08-20 NOTE — Telephone Encounter (Signed)
   [facility] Pre-operative Risk Assessment    Patient Name: [name] [name]  DOB: 1945/02/22 MRN: [id number]  HEARTCARE STAFF:  - IMPORTANT!!!!!! Under Visit Info/Reason for Call, type in Other and utilize the format Clearance MM/DD/YY or Clearance TBD. Do not use dashes or single digits. - Please review there is not already an duplicate clearance open for this procedure. - If request is for dental extraction, please clarify the # of teeth to be extracted. - If the patient is currently at the dentist's office, call Pre-Op Callback Staff (MA/nurse) to input urgent request.  - If the patient is not currently in the dentist office, please route to the Pre-Op pool.  Request for surgical clearance:  What type of surgery is being performed? Turp and bladder stone removal   When is this surgery scheduled? 09/12/20  What type of clearance is required (medical clearance vs. Pharmacy clearance to hold med vs. Both)? medical  Are there any medications that need to be held prior to surgery and how long? no  Practice name and name of physician performing surgery? [facility]  What is the office phone number? [phone] // Dr. Louis Meckel   7.   What is the office fax number? [phone]  8.   Anesthesia type (None, local, MAC, general) ?  General    [name] [name] 08/20/2020, 3:15 PM  _________________________________________________________________   (provider comments below)

## 2020-08-20 NOTE — Telephone Encounter (Signed)
   Primary Cardiologist: Quay Burow, MD  Chart reviewed as part of pre-operative protocol coverage. Given past medical history and time since last visit, based on ACC/AHA guidelines, [name] [name] would be at acceptable risk for the planned procedure without further cardiovascular testing.   Patient was advised that if he develops new symptoms prior to surgery to contact our office to arrange a follow-up appointment.  He verbalized understanding.  I will route this recommendation to the requesting party via Epic fax function and remove from pre-op pool.  Please call with questions.  Jossie Ng. Priscille Shadduck NP-C    08/20/2020, 3:48 PM [address] Office [phone] Fax [phone]

## 2020-08-27 NOTE — Patient Instructions (Addendum)
DUE TO COVID-19 ONLY ONE VISITOR IS ALLOWED TO COME WITH YOU AND STAY IN THE WAITING ROOM ONLY DURING PRE OP AND PROCEDURE.   **NO VISITORS ARE ALLOWED IN THE SHORT STAY AREA OR RECOVERY ROOM!!**  IF YOU WILL BE ADMITTED INTO THE HOSPITAL YOU ARE ALLOWED ONLY TWO SUPPORT PEOPLE DURING VISITATION HOURS ONLY (10AM -8PM)   The support person(s) may change daily. The support person(s) must pass our screening, gel in and out, and wear a mask at all times, including in the patient's room. Patients must also wear a mask when staff or their support person are in the room.  No visitors under the age of 73. Any visitor under the age of 34 must be accompanied by an adult.    COVID SWAB TESTING MUST BE COMPLETED ON:  Tuesday, 09-10-20 (Hours testing site 8 to 3)     [address], [place] (backside of the building)  You are not required to quarantine, however you are required to wear a well-fitted mask when you are out and around people not in your household.  Hand Hygiene often Do NOT share personal items Notify your provider if you are in close contact with someone who has COVID or you develop fever 100.4 or greater, new onset of sneezing, cough, sore throat, shortness of breath or body aches.         Your procedure is scheduled on:  Thursday, 09-12-20   Report to [facility] Main  Entrance   Report to Short Stay at 5:15 AM   [facility])   Call this number if you have problems the morning of surgery [phone]   Do not eat food :After Midnight.   May have liquids until 4:30 AM  day of surgery  CLEAR LIQUID DIET  Foods Allowed                                                                     Foods Excluded  Water, Black Coffee and tea, regular and decaf               liquids that you cannot  Plain Jell-O in any flavor  (No red)                                     see through such as: Fruit ices (not with fruit pulp)                                       milk, soups, orange juice              Iced Popsicles (No red)                                      All solid food                                   Apple juices  Sports drinks like Gatorade (No red) Lightly seasoned clear broth or consume(fat free) Sugar, honey syrup    Oral Hygiene is also important to reduce your risk of infection.                                    Remember - BRUSH YOUR TEETH THE MORNING OF SURGERY WITH YOUR REGULAR TOOTHPASTE   Do NOT smoke after Midnight   Take these medicines the morning of surgery with A SIP OF WATER:  Alprazolam, Carbidopa-Levodopa, Escitalopram, Rasagiline                               You may not have any metal on your body including jewelry, and body piercing             Do not wear lotions, powders, cologne or deodorant              Men may shave face and neck.   Do not bring valuables to the hospital. [place].   Contacts, dentures or bridgework may not be worn into surgery.   Bring small overnight bag day of surgery.   Please read over the following fact sheets you were given: IF YOU HAVE QUESTIONS ABOUT YOUR PRE OP INSTRUCTIONS PLEASE CALL [facility] - Preparing for Surgery Before surgery, you can play an important role.  Because skin is not sterile, your skin needs to be as free of germs as possible.  You can reduce the number of germs on your skin by washing with CHG (chlorahexidine gluconate) soap before surgery.  CHG is an antiseptic cleaner which kills germs and bonds with the skin to continue killing germs even after washing. Please DO NOT use if you have an allergy to CHG or antibacterial soaps.  If your skin becomes reddened/irritated stop using the CHG and inform your nurse when you arrive at Short Stay. Do not shave (including legs and underarms) for at least 48 hours prior to the first CHG shower.  You may shave your face/neck.  Please follow these instructions  carefully:  1.  Shower with CHG Soap the night before surgery and the  morning of surgery.  2.  If you choose to wash your hair, wash your hair first as usual with your normal  shampoo.  3.  After you shampoo, rinse your hair and body thoroughly to remove the shampoo.                             4.  Use CHG as you would any other liquid soap.  You can apply chg directly to the skin and wash.  Gently with a scrungie or clean washcloth.  5.  Apply the CHG Soap to your body ONLY FROM THE NECK DOWN.   Do   not use on face/ open                           Wound or open sores. Avoid contact with eyes, ears mouth and   genitals (private parts).                       Wash face,  Genitals (private parts) with your normal  soap.             6.  Wash thoroughly, paying special attention to the area where your    surgery  will be performed.  7.  Thoroughly rinse your body with warm water from the neck down.  8.  DO NOT shower/wash with your normal soap after using and rinsing off the CHG Soap.                9.  Pat yourself dry with a clean towel.            10.  Wear clean pajamas.            11.  Place clean sheets on your bed the night of your first shower and do not  sleep with pets. Day of Surgery : Do not apply any lotions/deodorants the morning of surgery.  Please wear clean clothes to the hospital/surgery center.  FAILURE TO FOLLOW THESE INSTRUCTIONS MAY RESULT IN THE CANCELLATION OF YOUR SURGERY  PATIENT SIGNATURE_________________________________  NURSE SIGNATURE__________________________________  ________________________________________________________________________

## 2020-08-27 NOTE — Progress Notes (Addendum)
COVID Vaccine Completed: Yes x3 Date COVID Vaccine completed: Has received booster: Yes x1 COVID vaccine manufacturer:     Moderna     Date of COVID positive in last 90 days:  No  PCP Silverio [place], [place] Ph# [phone] ext [phone] Cardiologist - Quay Burow, MD  Clearance in Epic dated 08-20-20 by Coletta Memos NP-C  Chest x-ray - N/A EKG - 10-27-19 Epic Stress Test - 08-13-20 Epic ECHO - N/A Cardiac Cath - N/A Pacemaker/ICD device last checked: Spinal Cord Stimulator:  Sleep Study -  Yes, neg sleep apnea CPAP -   Fasting Blood Sugar - N/A Checks Blood Sugar _____ times a day  Blood Thinner Instructions:  N/A Aspirin Instructions: Last Dose:  Activity level:  Unable to go up stairs due to Parkinson's disease.  Denies chest pain or SOB with activity.   Patient is able to feed, bath and dress himself.       Anesthesia review:  Followed by cardiology for orthostatic hypotension with syncopal episodes.   Parkinson's disease.  Patient states no syncopal episodes in 6 months.  Patient denies shortness of breath, fever, cough and chest pain at PAT appointment   Patient verbalized understanding of instructions that were given to them at the PAT appointment. Patient was also instructed that they will need to review over the PAT instructions again at home before surgery.

## 2020-08-28 ENCOUNTER — Other Ambulatory Visit: Payer: Self-pay

## 2020-08-28 ENCOUNTER — Encounter (HOSPITAL_COMMUNITY): Payer: Self-pay

## 2020-08-28 ENCOUNTER — Encounter (HOSPITAL_COMMUNITY)
Admission: RE | Admit: 2020-08-28 | Discharge: 2020-08-28 | Disposition: A | Discharge status: Home / Self Care | Payer: Medicare Other | Source: Ambulatory Visit | Attending: Urology | Admitting: Urology

## 2020-08-28 DIAGNOSIS — Z01812 Encounter for preprocedural laboratory examination: Secondary | ICD-10-CM | POA: Diagnosis not present

## 2020-08-28 LAB — CBC
HCT: 39.1 % (ref 39.0–52.0)
Hemoglobin: 13.1 g/dL (ref 13.0–17.0)
MCH: 32.8 pg (ref 26.0–34.0)
MCHC: 33.5 g/dL (ref 30.0–36.0)
MCV: 98 fL (ref 80.0–100.0)
Platelets: 225 10*3/uL (ref 150–400)
RBC: 3.99 MIL/uL — ABNORMAL LOW (ref 4.22–5.81)
RDW: 12.1 % (ref 11.5–15.5)
WBC: 5.5 10*3/uL (ref 4.0–10.5)
nRBC: 0 % (ref 0.0–0.2)

## 2020-08-28 LAB — BASIC METABOLIC PANEL
Anion gap: 7 (ref 5–15)
BUN: 18 mg/dL (ref 8–23)
CO2: 29 mmol/L (ref 22–32)
Calcium: 9.3 mg/dL (ref 8.9–10.3)
Chloride: 104 mmol/L (ref 98–111)
Creatinine, Ser: 0.82 mg/dL (ref 0.61–1.24)
GFR, Estimated: >60 mL/min (ref >60)
Glucose, Bld: 101 mg/dL — ABNORMAL HIGH (ref 70–99)
Potassium: 3.9 mmol/L (ref 3.5–5.1)
Sodium: 140 mmol/L (ref 135–145)

## 2020-09-04 NOTE — Anesthesia Preprocedure Evaluation (Addendum)
Anesthesia Evaluation  Patient identified by MRN, date of birth, ID band Patient awake    Reviewed: Allergy & Precautions, NPO status , Patient's Chart, lab work & pertinent test results  Airway Mallampati: II  TM Distance: >3 FB Neck ROM: Full    Dental  (+) Teeth Intact, Dental Advisory Given   Pulmonary    breath sounds clear to auscultation       Cardiovascular negative cardio ROS   Rhythm:Regular Rate:Normal  Gated Study: ? The left ventricular ejection fraction is hyperdynamic (>65%). ? Nuclear stress EF: 67%. ? There was no ST segment deviation noted during stress. ? The study is normal. ? This is a low risk study.    Neuro/Psych PSYCHIATRIC DISORDERS Depression    GI/Hepatic Neg liver ROS, GERD  ,  Endo/Other    Renal/GU      Musculoskeletal negative musculoskeletal ROS (+)   Abdominal Normal abdominal exam  (+)   Peds  Hematology   Anesthesia Other Findings   Reproductive/Obstetrics                           Anesthesia Physical Anesthesia Plan  ASA: 2  Anesthesia Plan: General   Post-op Pain Management:    Induction: Intravenous  PONV Risk Score and Plan: 3 and Ondansetron, Dexamethasone and Treatment may vary due to age or medical condition  Airway Management Planned: LMA  Additional Equipment: None  Intra-op Plan:   Post-operative Plan: Extubation in OR  Informed Consent: I have reviewed the patients History and Physical, chart, labs and discussed the procedure including the risks, benefits and alternatives for the proposed anesthesia with the patient or authorized representative who has indicated his/her understanding and acceptance.     Dental advisory given  Plan Discussed with: CRNA  Anesthesia Plan Comments: (See PAT note 08/28/2020, Konrad Felix, PA-C)      Anesthesia Quick Evaluation

## 2020-09-04 NOTE — Progress Notes (Signed)
Anesthesia Chart Review   Case: [id number] Date/Time: 09/12/20 0715   Procedures:      CYSTOSCOPY WITH LITHOLAPAXY - REQUESTING 2 HRS     TRANSURETHRAL RESECTION OF THE PROSTATE (TURP)   Anesthesia type: General   Pre-op diagnosis: BLADDER STONES   Location: [facility]   Surgeons: Ardis Hughs, MD       DISCUSSION:75 y.o. never smoker with h/o GERD, parkinson's disease, s/p deep brain stimulator 11/2012, orthostatic hypotension, bladder stones scheduled for above procedure 09/12/2020 with Dr. Louis Meckel.   Per neurology notes DBS was placed in 2014. The battery has since reached end of service and pt chose not to replace, battery remains in place.   Per cardiology preoperative evaluation 08/20/2020, "Chart reviewed as part of pre-operative protocol coverage. Given past medical history and time since last visit, based on ACC/AHA guidelines, [name] [name] would be at acceptable risk for the planned procedure without further cardiovascular testing."  Low risk stress test 08/13/2020.  VS: BP 121/66   Pulse 63   Temp 36.8 C (Oral)   Resp 16   Ht '5\' 10"'$  (1.778 m)   Wt 62.6 kg   SpO2 97%   BMI 19.80 kg/m   PROVIDERS: Pcp, No  Quay Burow, MD is Cardiologist  LABS: Labs reviewed: Acceptable for surgery. (all labs ordered are listed, but only abnormal results are displayed)  Labs Reviewed  CBC - Abnormal; Notable for the following components:      Result Value   RBC 3.99 (*)    All other components within normal limits  BASIC METABOLIC PANEL - Abnormal; Notable for the following components:   Glucose, Bld 101 (*)    All other components within normal limits     IMAGES:   EKG: 10/27/2019 Rate 67 bpm  NSR Possible left atrial enlargement   CV: Stress Test 08/13/2020 The left ventricular ejection fraction is hyperdynamic (>65%). Nuclear stress EF: 67%. There was no ST segment deviation noted during stress. The study is normal. This is a  low risk study.   Normal stress nuclear study with no ischemia or infarction.  Gated ejection fraction 67% with normal wall motion. Past Medical History:  Diagnosis Date   BPH (benign prostatic hypertrophy)    Cancer (HCC)    basal cell temple   Constipation    Depression    Fatigue    d/t Parkinson's   GERD (gastroesophageal reflux disease)    History of kidney stones    Left ureteral calculus    Lower urinary tract symptoms (LUTS)    Nephrolithiasis    Neuromuscular disorder (HCC)     parkinson's   Orthostatic hypotension    d/t Parkinson's disease   Orthostatic hypotension    Parkinson's disease ([place])    Peyronie disease    Pneumonia 2018   Renal cyst, left    S/P deep brain stimulator placement    11-29-2012   Sleep behavior disorder, REM    Wears glasses    Wears hearing aid    BILATERAL-- INTERMITTANT WEARS    Past Surgical History:  Procedure Laterality Date   CHOLECYSTECTOMY  12/21/2014   Procedure: LAPAROSCOPIC CHOLECYSTECTOMY;  Surgeon: Ralene Ok, MD;  Location: [name];  Service: General;;   COLONOSCOPY     CYSTO/  LEFT URETEROSOCPY/   LEFT URETERAL STENT PLACEMENT/  LASER BLADDER STONES AND EXTRACTION  09-25-2010   CYSTOSCOPY WITH INSERTION OF UROLIFT N/A 03/11/2017   Procedure: CYSTOSCOPY WITH INSERTION OF UROLIFT,  LITHOPAXY,STONE OBTAINED;  Surgeon: Franchot Gallo, MD;  Location[facility];  Service: Urology;  Laterality: N/A;   CYSTOSCOPY WITH RETROGRADE PYELOGRAM, URETEROSCOPY AND STENT PLACEMENT Left 01/06/2013   Procedure: CYSTOSCOPY WITH RETROGRADE PYELOGRAM, URETEROSCOPY AND STENT PLACEMENT;  Surgeon: Hanley Ben, MD;  Location: [place];  Service: Urology;  Laterality: Left;   CYSTOSCOPY WITH RETROGRADE PYELOGRAM, URETEROSCOPY AND STENT PLACEMENT Left 09/11/2014   Procedure: CYSTOSCOPY WITH LEFT RETROGRADE PYELOGRAM, URETEROSCOPY AND STENT PLACEMENT;  Surgeon: Lowella Bandy, MD;  Location: [facility];  Service: Urology;  Laterality: Left;   DEEP BRAIN STIMULATOR PLACEMENT  11/29/2012   genertor device at left pectoral area-   EXTRACORPOREAL SHOCK WAVE LITHOTRIPSY Left 10-11-2010  &  01-05-2011   HOLMIUM LASER APPLICATION Left [id number]   Procedure: HOLMIUM LASER APPLICATION;  Surgeon: Hanley Ben, MD;  Location: [place];  Service: Urology;  Laterality: Left;   HOLMIUM LASER APPLICATION Left [id number]   Procedure: HOLMIUM LASER APPLICATION;  Surgeon: Lowella Bandy, MD;  Location: [facility];  Service: Urology;  Laterality: Left;   HOLMIUM LASER APPLICATION N/A [id number]   Procedure: HOLMIUM LASER APPLICATION;  Surgeon: Franchot Gallo, MD;  Location: [facility];  Service: Urology;  Laterality: N/A;   NEGATIVE SLEEP STUDY  2013  per pt   SP PERC NEPHROSTOMY Left 12-16-2012   STONE EXTRACTION WITH BASKET Left 01/06/2013   Procedure: STONE EXTRACTION WITH BASKET;  Surgeon: Hanley Ben, MD;  Location: [place];  Service: Urology;  Laterality: Left;    MEDICATIONS:  ALPRAZolam (XANAX) 0.5 MG tablet   carbidopa-levodopa (SINEMET) 25-100 MG per tablet   escitalopram (LEXAPRO) 20 MG tablet   fludrocortisone (FLORINEF) 0.1 MG tablet   Phenazopyridine HCl (AZO TABS PO)   rasagiline (AZILECT) 1 MG TABS tablet   triamcinolone cream (KENALOG) 0.1 %   No current facility-administered medications for this encounter.    Konrad Felix, PA-C [facility] Pre-Surgical Testing [phone]

## 2020-09-10 ENCOUNTER — Other Ambulatory Visit: Payer: Self-pay | Admitting: Urology

## 2020-09-11 LAB — SARS CORONAVIRUS 2 (TAT 6-24 HRS): SARS Coronavirus 2: NEGATIVE

## 2020-09-11 NOTE — Progress Notes (Addendum)
Spoke with patient wife [name] [name] and she is aware of surgery location change and arrive 530 09-12-2020 [facility]  no food after midnight clear liquids from midnight until 430 am then npo and spouse can spend night per beverly taavon and pt wife to bring remote controller  for deep brain stimulator. Pt cannot have mri due to remote brain stimulator. Follow all other instructions given at pre op.  Pt blood pressure drops when he stands

## 2020-09-12 ENCOUNTER — Encounter (HOSPITAL_BASED_OUTPATIENT_CLINIC_OR_DEPARTMENT_OTHER): Payer: Self-pay | Admitting: Urology

## 2020-09-12 ENCOUNTER — Inpatient Hospital Stay (HOSPITAL_BASED_OUTPATIENT_CLINIC_OR_DEPARTMENT_OTHER)
Admission: RE | Admit: 2020-09-12 | Discharge: 2020-09-16 | DRG: 661 | Disposition: A | Discharge status: Home / Self Care | Payer: Medicare Other | Attending: Urology | Admitting: Urology

## 2020-09-12 ENCOUNTER — Ambulatory Visit (HOSPITAL_BASED_OUTPATIENT_CLINIC_OR_DEPARTMENT_OTHER): Payer: Medicare Other | Admitting: Anesthesiology

## 2020-09-12 ENCOUNTER — Ambulatory Visit (HOSPITAL_BASED_OUTPATIENT_CLINIC_OR_DEPARTMENT_OTHER): Payer: Medicare Other | Admitting: Physician Assistant

## 2020-09-12 ENCOUNTER — Encounter (HOSPITAL_COMMUNITY)
Admission: RE | Disposition: A | Discharge status: Home / Self Care | Payer: Self-pay | Source: Home / Self Care | Attending: Urology

## 2020-09-12 ENCOUNTER — Other Ambulatory Visit: Payer: Self-pay

## 2020-09-12 DIAGNOSIS — Z20822 Contact with and (suspected) exposure to covid-19: Secondary | ICD-10-CM | POA: Diagnosis present

## 2020-09-12 DIAGNOSIS — G20 Parkinson's disease: Secondary | ICD-10-CM | POA: Diagnosis present

## 2020-09-12 DIAGNOSIS — R310 Gross hematuria: Secondary | ICD-10-CM | POA: Diagnosis present

## 2020-09-12 DIAGNOSIS — R54 Age-related physical debility: Secondary | ICD-10-CM | POA: Diagnosis present

## 2020-09-12 DIAGNOSIS — N210 Calculus in bladder: Secondary | ICD-10-CM | POA: Diagnosis present

## 2020-09-12 DIAGNOSIS — N320 Bladder-neck obstruction: Secondary | ICD-10-CM | POA: Diagnosis present

## 2020-09-12 DIAGNOSIS — N201 Calculus of ureter: Secondary | ICD-10-CM | POA: Diagnosis present

## 2020-09-12 HISTORY — PX: CYSTOSCOPY WITH LITHOLAPAXY: SHX1425

## 2020-09-12 HISTORY — PX: TRANSURETHRAL RESECTION OF PROSTATE: SHX73

## 2020-09-12 LAB — CBC
HCT: 36.7 % — ABNORMAL LOW (ref 39.0–52.0)
Hemoglobin: 11.9 g/dL — ABNORMAL LOW (ref 13.0–17.0)
MCH: 32 pg (ref 26.0–34.0)
MCHC: 32.4 g/dL (ref 30.0–36.0)
MCV: 98.7 fL (ref 80.0–100.0)
Platelets: 257 10*3/uL (ref 150–400)
RBC: 3.72 MIL/uL — ABNORMAL LOW (ref 4.22–5.81)
RDW: 12 % (ref 11.5–15.5)
WBC: 9.9 10*3/uL (ref 4.0–10.5)
nRBC: 0 % (ref 0.0–0.2)

## 2020-09-12 SURGERY — CYSTOSCOPY, WITH BLADDER CALCULUS LITHOLAPAXY
Anesthesia: General | Site: Prostate

## 2020-09-12 MED ORDER — RASAGILINE MESYLATE 1 MG PO TABS
1.0000 mg | ORAL_TABLET | Freq: Every day | ORAL | Status: DC
  Administered 2020-09-12 – 2020-09-16 (×5): 1 mg via ORAL
  Filled 2020-09-12 (×5): qty 1

## 2020-09-12 MED ORDER — EPHEDRINE SULFATE 50 MG/ML IJ SOLN
INTRAMUSCULAR | Status: DC | PRN
  Administered 2020-09-12: 5 mg via INTRAVENOUS
  Administered 2020-09-12 (×2): 10 mg via INTRAVENOUS

## 2020-09-12 MED ORDER — ESCITALOPRAM OXALATE 20 MG PO TABS
20.0000 mg | ORAL_TABLET | Freq: Every morning | ORAL | Status: DC
  Administered 2020-09-13 – 2020-09-16 (×4): 20 mg via ORAL
  Filled 2020-09-12 (×4): qty 1

## 2020-09-12 MED ORDER — BELLADONNA ALKALOIDS-OPIUM 16.2-60 MG RE SUPP
1.0000 | Freq: Three times a day (TID) | RECTAL | Status: DC | PRN
  Administered 2020-09-12 – 2020-09-15 (×5): 1 via RECTAL
  Filled 2020-09-12 (×5): qty 1

## 2020-09-12 MED ORDER — CIPROFLOXACIN HCL 500 MG PO TABS
500.0000 mg | ORAL_TABLET | Freq: Once | ORAL | Status: AC
  Administered 2020-09-12: 500 mg via ORAL
  Filled 2020-09-12: qty 1

## 2020-09-12 MED ORDER — ORAL CARE MOUTH RINSE: 15.0000 mL | Freq: Once | OROMUCOSAL | Status: DC

## 2020-09-12 MED ORDER — FLUDROCORTISONE ACETATE 0.1 MG PO TABS
0.1000 mg | ORAL_TABLET | Freq: Two times a day (BID) | ORAL | Status: DC
  Administered 2020-09-12 – 2020-09-16 (×8): 0.1 mg via ORAL
  Filled 2020-09-12 (×8): qty 1

## 2020-09-12 MED ORDER — SODIUM CHLORIDE 0.9 % IR SOLN
Status: DC | PRN
  Administered 2020-09-12 (×4): 6000 mL via INTRAVESICAL
  Administered 2020-09-12: 6000 mL
  Administered 2020-09-12 (×2): 6000 mL via INTRAVESICAL

## 2020-09-12 MED ORDER — BELLADONNA ALKALOIDS-OPIUM 16.2-30 MG RE SUPP
1.0000 | Freq: Once | RECTAL | Status: AC
  Administered 2020-09-12: 1 via RECTAL

## 2020-09-12 MED ORDER — DEXAMETHASONE SODIUM PHOSPHATE 4 MG/ML IJ SOLN
INTRAMUSCULAR | Status: DC | PRN
  Administered 2020-09-12: 5 mg via INTRAVENOUS

## 2020-09-12 MED ORDER — ONDANSETRON HCL 4 MG PO TABS: 4.0000 mg | ORAL_TABLET | Freq: Three times a day (TID) | ORAL | Status: DC | PRN

## 2020-09-12 MED ORDER — LACTATED RINGERS IV SOLN: INTRAVENOUS | Status: DC

## 2020-09-12 MED ORDER — SODIUM CHLORIDE 0.45 % IV SOLN: INTRAVENOUS | Status: DC

## 2020-09-12 MED ORDER — CHLORHEXIDINE GLUCONATE 0.12 % MT SOLN: 15.0000 mL | Freq: Once | OROMUCOSAL | Status: DC

## 2020-09-12 MED ORDER — CARBIDOPA-LEVODOPA 25-100 MG PO TABS
2.0000 | ORAL_TABLET | Freq: Every day | ORAL | Status: DC
  Administered 2020-09-12 – 2020-09-16 (×19): 2 via ORAL
  Filled 2020-09-12 (×19): qty 2

## 2020-09-12 MED ORDER — DEXAMETHASONE SODIUM PHOSPHATE 10 MG/ML IJ SOLN
INTRAMUSCULAR | Status: AC
  Filled 2020-09-12: qty 1

## 2020-09-12 MED ORDER — PROPOFOL 10 MG/ML IV BOLUS
INTRAVENOUS | Status: AC
  Filled 2020-09-12: qty 40

## 2020-09-12 MED ORDER — FENTANYL CITRATE (PF) 100 MCG/2ML IJ SOLN
INTRAMUSCULAR | Status: DC | PRN
  Administered 2020-09-12 (×8): 25 ug via INTRAVENOUS

## 2020-09-12 MED ORDER — FENTANYL CITRATE (PF) 100 MCG/2ML IJ SOLN: 25.0000 ug | INTRAMUSCULAR | Status: DC | PRN

## 2020-09-12 MED ORDER — PROPOFOL 10 MG/ML IV BOLUS
INTRAVENOUS | Status: DC | PRN
  Administered 2020-09-12: 100 mg via INTRAVENOUS

## 2020-09-12 MED ORDER — SODIUM CHLORIDE 0.9 % IR SOLN
3000.0000 mL | Status: DC
  Administered 2020-09-12 (×11): 3000 mL

## 2020-09-12 MED ORDER — ONDANSETRON HCL 4 MG/2ML IJ SOLN
INTRAMUSCULAR | Status: AC
  Filled 2020-09-12: qty 2

## 2020-09-12 MED ORDER — FENTANYL CITRATE (PF) 100 MCG/2ML IJ SOLN
INTRAMUSCULAR | Status: AC
  Filled 2020-09-12: qty 2

## 2020-09-12 MED ORDER — LIDOCAINE HCL (PF) 2 % IJ SOLN
INTRAMUSCULAR | Status: AC
  Filled 2020-09-12: qty 5

## 2020-09-12 MED ORDER — ONDANSETRON HCL 4 MG/2ML IJ SOLN
INTRAMUSCULAR | Status: DC | PRN
  Administered 2020-09-12: 4 mg via INTRAVENOUS

## 2020-09-12 MED ORDER — BELLADONNA ALKALOIDS-OPIUM 16.2-30 MG RE SUPP
RECTAL | Status: AC
  Filled 2020-09-12: qty 1

## 2020-09-12 MED ORDER — LIDOCAINE 2% (20 MG/ML) 5 ML SYRINGE
INTRAMUSCULAR | Status: DC | PRN
  Administered 2020-09-12: 40 mg via INTRAVENOUS

## 2020-09-12 MED ORDER — SODIUM CHLORIDE 0.9 % IR SOLN
3000.0000 mL | Status: DC
  Administered 2020-09-12 – 2020-09-15 (×11): 3000 mL

## 2020-09-12 MED ORDER — TRAMADOL HCL 50 MG PO TABS: 50.0000 mg | ORAL_TABLET | Freq: Four times a day (QID) | ORAL | Status: DC | PRN

## 2020-09-12 MED ORDER — CEFAZOLIN SODIUM-DEXTROSE 2-4 GM/100ML-% IV SOLN
INTRAVENOUS | Status: AC
  Filled 2020-09-12: qty 100

## 2020-09-12 MED ORDER — EPHEDRINE 5 MG/ML INJ
INTRAVENOUS | Status: AC
  Filled 2020-09-12: qty 5

## 2020-09-12 MED ORDER — CHLORHEXIDINE GLUCONATE CLOTH 2 % EX PADS
6.0000 | MEDICATED_PAD | Freq: Every day | CUTANEOUS | Status: DC
  Administered 2020-09-12 – 2020-09-16 (×5): 6 via TOPICAL

## 2020-09-12 MED ORDER — CEFAZOLIN SODIUM-DEXTROSE 2-4 GM/100ML-% IV SOLN
2.0000 g | INTRAVENOUS | Status: AC
  Administered 2020-09-12: 2 g via INTRAVENOUS

## 2020-09-12 MED ORDER — ALPRAZOLAM 0.5 MG PO TABS: 0.5000 mg | ORAL_TABLET | Freq: Two times a day (BID) | ORAL | Status: DC | PRN

## 2020-09-12 SURGICAL SUPPLY — 41 items
BAG DRAIN URO-CYSTO SKYTR STRL (DRAIN) ×3 IMPLANT
BAG DRN RND TRDRP ANRFLXCHMBR (UROLOGICAL SUPPLIES) ×2
BAG DRN UROCATH (DRAIN) ×2
BAG URINE DRAIN 2000ML AR STRL (UROLOGICAL SUPPLIES) ×3 IMPLANT
BASKET STONE 1.7 NGAGE (UROLOGICAL SUPPLIES) ×3 IMPLANT
CATH FOLEY 2WAY SLVR 30CC 24FR (CATHETERS) IMPLANT
CATH FOLEY 3WAY 30CC 22FR (CATHETERS) ×3 IMPLANT
CATH FOLEY 3WAY 30CC 24FR (CATHETERS) ×3
CATH URET 5FR 28IN OPEN ENDED (CATHETERS) ×3 IMPLANT
CATH URTH STD 24FR FL 3W 2 (CATHETERS) ×2 IMPLANT
CLOTH BEACON ORANGE TIMEOUT ST (SAFETY) ×3 IMPLANT
COVER DOME SNAP 22 D (MISCELLANEOUS) ×3 IMPLANT
ELECT REM PT RETURN 9FT ADLT (ELECTROSURGICAL) ×3
ELECTRODE REM PT RTRN 9FT ADLT (ELECTROSURGICAL) ×2 IMPLANT
EVACUATOR MICROVAS BLADDER (UROLOGICAL SUPPLIES) IMPLANT
FIBER LASER FLEXIVA 1000 (UROLOGICAL SUPPLIES) IMPLANT
FIBER LASER FLEXIVA 550 (UROLOGICAL SUPPLIES) ×3 IMPLANT
GAUZE SPONGE 4X4 12PLY STRL LF (GAUZE/BANDAGES/DRESSINGS) ×3 IMPLANT
GLOVE SURG ENC MOIS LTX SZ7.5 (GLOVE) ×3 IMPLANT
GOWN STRL REUS W/TWL LRG LVL3 (GOWN DISPOSABLE) ×3 IMPLANT
GOWN STRL REUS W/TWL XL LVL3 (GOWN DISPOSABLE) ×3 IMPLANT
GUIDEWIRE ANG ZIPWIRE 038X150 (WIRE) ×3 IMPLANT
GUIDEWIRE SENSOR ANG DUAL FLEX (WIRE) ×3 IMPLANT
HOLDER FOLEY CATH W/STRAP (MISCELLANEOUS) IMPLANT
IV NS IRRIG 3000ML ARTHROMATIC (IV SOLUTION) ×48 IMPLANT
KIT PROBE LTHTRP 340X3.4XLTHCL (MISCELLANEOUS) ×2 IMPLANT
KIT PROBE TRILOGY 3.4X340 (MISCELLANEOUS) ×3
KIT TURNOVER CYSTO (KITS) ×3 IMPLANT
LOOP CUT BIPOLAR 24F LRG (ELECTROSURGICAL) ×3 IMPLANT
MANIFOLD NEPTUNE II (INSTRUMENTS) ×3 IMPLANT
NS IRRIG 500ML POUR BTL (IV SOLUTION) ×3 IMPLANT
PACK CYSTO (CUSTOM PROCEDURE TRAY) ×3 IMPLANT
STENT URET 6FRX26 CONTOUR (STENTS) ×3 IMPLANT
SYR 30ML LL (SYRINGE) ×3 IMPLANT
SYR TOOMEY IRRIG 70ML (MISCELLANEOUS)
SYRINGE TOOMEY IRRIG 70ML (MISCELLANEOUS) IMPLANT
TUBE CONNECTING 12X1/4 (SUCTIONS) ×3 IMPLANT
TUBING STONE CATCHER TRILOGY (MISCELLANEOUS) ×3 IMPLANT
TUBING UROLOGY SET (TUBING) ×3 IMPLANT
WATER STERILE IRR 3000ML UROMA (IV SOLUTION) IMPLANT
lithoclast probe ×3 IMPLANT

## 2020-09-12 NOTE — Anesthesia Postprocedure Evaluation (Signed)
Anesthesia Post Note  Patient: [name] [name]  Procedure(s) Performed: CYSTOSCOPY WITH LITHOLAPAXY, LEFT RETROGRADE PYELOGRAMS, LEFT URETEROSCOPY, AND STENT PLACEMENT (Bladder) TRANSURETHRAL RESECTION OF THE PROSTATE (TURP) (Prostate)     Patient location during evaluation: PACU Anesthesia Type: General Level of consciousness: sedated and patient cooperative Pain management: pain level controlled Vital Signs Assessment: post-procedure vital signs reviewed and stable Respiratory status: spontaneous breathing Cardiovascular status: stable Anesthetic complications: no Comments: To be admitted to 4th floor for continuous bladder irrigation. Hemodynamically stable. Irrigation fluid apparently less bloody than prior. Dr. Louis Meckel evaluating at bedside.   No notable events documented.  Last Vitals:  Vitals:   09/12/20 1700 09/12/20 1730  BP: 99/61 117/69  Pulse: 99 99  Resp: 18 15  Temp: 37.3 C   SpO2: 97% 95%    Last Pain:  Vitals:   09/12/20 1700  TempSrc:   PainSc: 0-No pain                 [name]

## 2020-09-12 NOTE — H&P (Signed)
75 year old male who presents today for re-evaluation. The patient has had worsening urinary urgency and associated urge incontinence. He also is complaining of weak stream. The patient had a uro lift procedure in 2019 and following that did better. However, over the last year things have progressed.   The patient has been very Covid cautious, has not been to a doctor in over 18 months. He has had some progression in his Parkinson's disease.   8/21: The patient was started on alfuzosin and Myrbetriq. He did not tolerate the alfuzosin because of the lightheadedness despite cutting the tablets in half and taking 1 in the morning and 1 in the evening. He also was on Myrbetriq which he has stopped because of concern for constipation and other side effects. The patient has developed progression of his symptoms. He now has nocturnal enuresis. He has significant urinary urgency. He describes a weak stream.   The patient also thinks that he may have passed a stone last week. He did have some discomfort. He had some blood in his urine, and notes that he may have passed a stone recently. He also has lost a considerable amount of weight over the last 6 months.     ALLERGIES: Exelon CAPS Tamsulosin HCl CAPS    MEDICATIONS: Uroxatral 10 mg tablet, extended release 24 hr 1 tablet PO Daily  Advil TABS Oral  Alprazolam 0.25 mg tablet 0 Oral  Carbidopa-Levodopa 25 mg-100 mg tablet Oral  Escitalopram Oxalate 20 mg tablet Oral  Fludrocortisone Acetate 0.1 mg tablet  Rasagiline Mesylate 1 mg tablet     GU PSH: Complex Uroflow - 2019 Cysto Bladder Stone <2.5cm - 2019 Cysto Uretero Lithotripsy - 2016, 2014, 2012 Cystoscopy - 2019 Cystoscopy Insert Stent - 2016, 2014, 2012, 2012 ESWL - 2012, 2012 Ureteroscopic stone removal - 2012 UROLIFT - 2019       PSH Notes: Cystoscopy With Ureteroscopy With Lithotripsy, Cystoscopy With Insertion Of Ureteral Stent Left, Cystoscopy With Insertion Of Ureteral Stent Left,  Cystoscopy With Ureteroscopy With Lithotripsy, Lithotripsy, Cystoscopy With Ureteroscopy With Lithotripsy, Cystoscopy With Insertion Of Ureteral Stent Left, Lithotripsy, Cystoscopy With Ureteroscopy With Manipulation Of Calculus, Cystoscopy With Insertion Of Ureteral Stent, Brain stimulator (05/2015)   NON-GU PSH: No Non-GU PSH    GU PMH: Urinary Urgency (Stable) - 08/03/2019, (Improving), Treating his constipation in addition to his bladder symptoms has improved his urgency significantly., - 2017 BPH w/LUTS (Improving), Significant improvement from his Urolift procedure - 2019, (Worsening), BPH with intolerance of alpha blockers. Obstructive symptomatology with visiual (cysto) and flow evidence of obstruction, - 2019, - 2019 (Stable), On uroxatral and recognizes significant improvement., - 2017 (Chronic), The patient's voiding symptoms have been stable while on Uroxatral. However, he does have some urge incontinence that has worsened over the past several years., - 2017, Benign localized prostatic hyperplasia with lower urinary tract symptoms (LUTS), - 2016 Renal calculus (Stable) - 2019, Bilateral kidney stones, - 2016 Urinary Obstruction - 2019 Urinary Frequency (Improving), Myrbetriq 58m has made a bid difference for him. PVR is minimal today. - 2017 LLQ pain, Abdominal pain, LLQ (left lower quadrant) - 2016 Ureteral calculus, Calculus of left ureter - 2016 Nocturia, Nocturia - 2016 Urinary Retention, Unspec, Urinary retention - 2014 Urinary Tract Inf, Unspec site, Urinary tract infection - 2014 Bladder Stone, Bladder calculus - 2014 BPH w/o LUTS, Benign Prostatic Hypertrophy - 2014 Peyronies Disease, Peyronie's Disease - 2014      PMH Notes: Patient has Parkinson's.   He has a history of  Nephrolithiasis - had right URS by Dr. Janice Norrie in Aug 2016, non-obstructing stone in the right kidney from CT scan in May 2016.   The patient has a family history of prostate cancer. His father died of  metastatic prostate cancer in his 68s.      NON-GU PMH: Calculus of gallbladder without cholecystitis without obstruction, Cholelithiasis - 2016 Nausea, Nausea - 2016 Encounter for general adult medical examination without abnormal findings, Encounter for preventive health examination - 2015 Anxiety, Anxiety (Symptom) - 2014 Parkinson''s disease, Parkinson's Disease - 2014    FAMILY HISTORY: Brain Cancer - Father Father Deceased At Age11 ___ - Runs In Family Lung Cancer - Runs In Family Mother Deceased At Age 78 from diabetic complicati - Runs In Family rheumatoid arthritis - Mother Transient Ischemic Attack - Father   SOCIAL HISTORY: Marital Status: Married Preferred Language: English; Ethnicity: Not Hispanic Or Latino; Race: White Current Smoking Status: Patient has never smoked.   Tobacco Use Assessment Completed: Used Tobacco in last 30 days? Drinks 1 caffeinated drink per day.     Notes: Caffeine Use, Alcohol Use, Never A Smoker, Marital History - Currently Married   REVIEW OF SYSTEMS:    GU Review Male:   Patient denies frequent urination, hard to postpone urination, burning/ pain with urination, get up at night to urinate, leakage of urine, stream starts and stops, trouble starting your stream, have to strain to urinate , erection problems, and penile pain.  Gastrointestinal (Upper):   Patient denies nausea, vomiting, and indigestion/ heartburn.  Gastrointestinal (Lower):   Patient denies diarrhea and constipation.  Constitutional:   Patient denies fever, night sweats, weight loss, and fatigue.  Skin:   Patient denies skin rash/ lesion and itching.  Eyes:   Patient denies blurred vision and double vision.  Ears/ Nose/ Throat:   Patient denies sore throat and sinus problems.  Hematologic/Lymphatic:   Patient denies swollen glands and easy bruising.  Cardiovascular:   Patient denies chest pains and leg swelling.  Respiratory:   Patient denies cough and shortness of breath.   Endocrine:   Patient denies excessive thirst.  Musculoskeletal:   Patient denies back pain and joint pain.  Neurological:   Patient denies headaches and dizziness.  Psychologic:   Patient denies depression and anxiety.   VITAL SIGNS:      08/01/2020 03:47 PM  Weight 138 lb / 62.6 kg  Height 70 in / 177.8 cm  BP 97/60 mmHg  Pulse 70 /min  Temperature 98.6 F / 37 C  BMI 19.8 kg/m   Complexity of Data:  Source Of History:  Patient  Lab Test Review:   PSA, CMP  Records Review:   Previous Doctor Records, Previous Patient Records, POC Tool  Urine Test Review:   Urinalysis  X-Ray Review: C.T. Abdomen/Pelvis: Reviewed Films. Discussed With Patient.     07/25/20 08/03/19 02/23/17 10/01/15 08/01/13 11/20/10  PSA  Total PSA 2.45 ng/mL 2.18 ng/mL 3.15 ng/mL 2.24 ng/dl 1.97  1.73     08/01/20 07/25/20  General Chemistry  Sodium  139 mEq/L  Potassium  3.8 mEq/L  BUN  17 mg/dL  Creatinine  0.7 mg/dL  Chloride  105 mEq/L  CO2  32 mEq/L  Glucose  90 mg/dL  Calcium  9.4 mg/dL  Protein, Total  6.8 g/dL  Albumin  3.9 g/dL  Bilirubin, Total  0.9 mg/dL  Alkaline Phosphatase  49 IU/L  ALT  5 IU/L  AST  17 IU/L  eGFR African American  106.2  eGFR Non-Afr. American  91.7   BUN/Creatinine Ratio  24.3 Ratio  CBC  WBC  5.7 K/uL  RBC  3.96 M/uL  Hemoglobin  13.9 g/dL  Hematocrit %  40.2 %  MCV  101 fL  MCH  35 pg  MCHC  34.5 g/dL  RDW  12.5 %  Platelets  290 K/uL  MPV  10.6 fL  Neutrophils %  72.3 %  Lymphocytes %  20.5 %  Monocytes %  6.3 %  Eosinophils %  0.9 %  Basophils %  0.0 %  Neutrophils  4.1 K/uL  Lymphocytes  1.2 K/uL  Monocytes  0.4 K/uL  Eosinophils  0.05 K/uL  Basophils  0.0 K/uL  Urinalysis  Urine Appearance Cloudy    Urine Color Yellow    Urine Glucose Neg mg/dL   Urine Bilirubin Neg mg/dL   Urine Ketones Trace mg/dL   Urine Specific Gravity 1.020    Urine Blood 3+ ery/uL   Urine pH 5.5    Urine Protein Trace mg/dL   Urine Urobilinogen 0.2 mg/dL    Urine Nitrites Neg    Urine Leukocyte Esterase 1+ leu/uL   Urine WBC/hpf 6 - 10/hpf    Urine RBC/hpf 20 - 40/hpf    Urine Epithelial Cells 0 - 5/hpf    Urine Bacteria Mod (26-50/hpf)    Urine Mucous Present    Urine Yeast NS (Not Seen)    Urine Trichomonas Not Present    Urine Cystals Ca Oxalate    Urine Casts Hyaline    Urine Sperm Not Present     PROCEDURES:          Urinalysis w/Scope Dipstick Dipstick Cont'd Micro  Color: Yellow Bilirubin: Neg mg/dL WBC/hpf: 6 - 10/hpf  Appearance: Cloudy Ketones: Trace mg/dL RBC/hpf: 20 - 40/hpf  Specific Gravity: 1.020 Blood: 3+ ery/uL Bacteria: Mod (26-50/hpf)  pH: 5.5 Protein: Trace mg/dL Cystals: Ca Oxalate  Glucose: Neg mg/dL Urobilinogen: 0.2 mg/dL Casts: Hyaline    Nitrites: Neg Trichomonas: Not Present    Leukocyte Esterase: 1+ leu/uL Mucous: Present      Epithelial Cells: 0 - 5/hpf      Yeast: NS (Not Seen)      Sperm: Not Present    ASSESSMENT:      ICD-10 Details  1 GU:   Dysuria - R30.0   2   BPH w/LUTS - N40.1   3   Nocturnal Enuresis - N39.44      PLAN:           Orders Labs Urine Culture  X-Rays: C.T. Abdomen/Pelvis With and Without I.V. Contrast          Schedule Return Visit/Planned Activity: ASAP - Office Visit          Document Letter(s):  Created for Patient: Clinical Summary         Notes:   The patient's progression of his voiding symptoms are likely results of his Parkinson's disease. Would be important to rule out any other causes. Further, it would also be important to evaluate the upper tracts because of this hematuria as well as the weight loss. I recommended that we perform a CT scan for the patient have her return shortly afterwards to review the results. In addition, I will send a urine culture.   The patient also inquires about starting finasteride, wait and see what the CT scan shows prior to starting finasteride.

## 2020-09-12 NOTE — Transfer of Care (Signed)
Immediate Anesthesia Transfer of Care Note  Patient: [name] [name]  Procedure(s) Performed: Procedure(s) (LRB): CYSTOSCOPY WITH LITHOLAPAXY, LEFT RETROGRADE PYELOGRAMS, LEFT URETEROSCOPY, AND STENT PLACEMENT (N/A) TRANSURETHRAL RESECTION OF THE PROSTATE (TURP) (N/A)  Patient Location: PACU  Anesthesia Type: General  Level of Consciousness: awake, sedated, patient cooperative and responds to stimulation  Airway & Oxygen Therapy: Patient Spontanous Breathing and Patient connected to [place] 02 and soft FM   Post-op Assessment: Report given to PACU RN, Post -op Vital signs reviewed and stable and Patient moving all extremities  Post vital signs: Reviewed and stable  Complications: No apparent anesthesia complications

## 2020-09-12 NOTE — Interval H&P Note (Signed)
History and Physical Interval Note: Upon further evaluation of the patient's CT scan it appears that the patient has a left distal ureteral stone.  We discussed this as well.  Our plan in addition to what is listed is to also perform bilateral retrograde pyelograms and left ureteroscopy, laser lithotripsy and possible stent placement.  Vitals:   09/12/20 0652  BP: 129/67  Pulse: 66  Resp: 15  Temp: 97.6 F (36.4 C)  TempSrc: Oral  SpO2: 99%  Weight: 66.9 kg  Height: '5\' 10"'$  (1.778 m)   NAD RRR CTA-B Abd soft  09/12/2020 7:31 AM  [name] [name]  has presented today for surgery, with the diagnosis of BLADDER STONES.  The various methods of treatment have been discussed with the patient and family. After consideration of risks, benefits and other options for treatment, the patient has consented to  Procedure(s) with comments: CYSTOSCOPY WITH LITHOLAPAXY (N/A) - REQUESTING 2 HRS TRANSURETHRAL RESECTION OF THE PROSTATE (TURP) (N/A) as a surgical intervention.  The patient's history has been reviewed, patient examined, no change in status, stable for surgery.  I have reviewed the patient's chart and labs.  Questions were answered to the patient's satisfaction.     Ardis Hughs

## 2020-09-12 NOTE — Anesthesia Procedure Notes (Signed)
Procedure Name: LMA Insertion Date/Time: 09/12/2020 7:51 AM Performed by: Justice Rocher, CRNA Pre-anesthesia Checklist: Patient identified, Emergency Drugs available, Suction available, Patient being monitored and Timeout performed Patient Re-evaluated:Patient Re-evaluated prior to induction Oxygen Delivery Method: Circle system utilized Preoxygenation: Pre-oxygenation with 100% oxygen Induction Type: IV induction Ventilation: Mask ventilation without difficulty LMA: LMA inserted LMA Size: 4.0 Number of attempts: 1 Airway Equipment and Method: Bite block Placement Confirmation: positive ETCO2, breath sounds checked- equal and bilateral and CO2 detector Tube secured with: Tape Dental Injury: Teeth and Oropharynx as per pre-operative assessment

## 2020-09-12 NOTE — Op Note (Signed)
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  There was no significant hydroureteronephrosis. #2: The stone was impacted in the sidewall of the UVJ and it was difficult to pass a wire across it.  Ultimately I was able to get a wire across and then perform ureteroscopy to remove the stone.  I did opt to place a stent because of the edema associated with the extraction. #3: The patient cystoscopy demonstrated orthotopic UOs, but the UOs were within small cellules.  They were difficult to visualize.  The patient had a heavily trabeculated bladder.  There were stones in the bladder ranging from approximately 2-1/2cm to 0.5 cm.  In total there were probably 5 or 6 large stones. #4: The patient had a high median bar and obstructing lateral lobes of his prostate.  EBL: 200 mL  Specimens:  #1: The stones were extracted from the patient's bladder and left ureter and sent to the [facility] lab for stone composition analysis. #2: Prostate chips were sent for further evaluation to the path lab.  Indication: [name] [name] is a 75 y.o. patient with history of Parkinson's disease and severe voiding dysfunction.  He also is having gross hematuria.  Patient has a previous history of a UroLift procedure that was largely unsuccessful in alleviating his symptoms.  A CT scan was performed because  of his gross hematuria and this demonstrated a left distal ureteral stone as well as a large volume of bladder stones.  We discussed the CT scan findings and opted to proceed with ureteroscopy, cystolitholapaxy, and TURP.  After reviewing the management options for treatment, he elected to proceed with the above surgical procedure(s). We have discussed the potential benefits and risks of the procedure, side effects of the proposed treatment, the likelihood of the patient achieving the goals of the procedure, and any potential problems that might occur during the procedure or recuperation. Informed consent has been obtained.  Description of procedure:  The patient was taken to the operating room and general anesthesia was induced.  The patient was placed in the dorsal lithotomy position, prepped and draped in the usual sterile fashion, and preoperative antibiotics were administered. A preoperative time-out was performed.   21 French 0 degree cystoscope was gently passed through the patient's urethra and into the bladder under visual guidance.  Cystoscopy was performed with the above findings.  I struggled to find the patient's left ureteral orifice, but ultimately was able to find it.  Once I found it difficulty advancing a wire.  I did end up using a Glidewire which was ultimately successful at navigating beyond the obstructing stone and into the left renal pelvis.  Once had the Glidewire above is able to get the open-ended catheter up into the renal pelvis and exchanged the Glidewire for a sensor wire.  I then used a semirigid ureteroscope and cannulated the left ureteral orifice encountering the stone.  The stone was fragmented with a 500 m laser fiber and the stone fragments were removed with an engage  basket.  I then replaced the cystoscope and advanced the 26 cm time 6 French double-J ureteral stent over the wire and into the left renal pelvis under fluoroscopic guidance.  Once it was well within the  renal pelvis I advanced it to the bladder neck before removing the wire entirely.  The stent tether was removed.  I then advanced the resectoscope into the patient's bladder and attempted to laser of the bladder stones through the resectoscope.  This proved to be problematic because I did not have the laser bridge.  As such I opted to remove the resectoscope and used the nephroscope.  Once I got the nephroscope into the patient's urethra I used the lithoclast device to break up and remove the stones.  In total there were 5 or 6 large stones, the largest one measuring approximately 2.5 cm.  All the stones were successfully removed.  Next I proceeded to perform a TURP.  I began by introducing the resectoscope sheath using the visual obturator.  I then exchanged the obturator for the loop element.  I started at the 7 o'clock position and created a channel down to the verumontanum.  I then subsequently resected from approximately 7:00 to 11:00 creating a nice resection on the patient's right lateral lobe.  I subsequently went to the left side and started at the 5 o'clock position and worked up to the 2 o'clock position.  All the prostate chips were pushed into the patient's bladder and subsequently irrigated later.  I then attempted to get hemostasis.  I was able to achieve it, but he subsequently started to develop hematuria.  Given the length of time that I resected and my confidence that I would be able to get the bleeding to stop with placement of a Foley catheter I opted to stop and placed a 24 [place] three-way Foley catheter.  This was after I irrigated out all the prostate chips.  I placed a BNO suppository in the patient's rectum.  He was subsequently returned to the PACU in stable condition.  CBI was initiated in the OR.  Disposition: The patient was transferred to the PACU in stable condition.  Did have some gross hematuria requiring some hand irrigation by the PACU nurses.  However, a repeat hemoglobin  demonstrated fairly stable hemoglobin.  Ardis Hughs, M.D.

## 2020-09-13 DIAGNOSIS — R54 Age-related physical debility: Secondary | ICD-10-CM | POA: Diagnosis not present

## 2020-09-13 DIAGNOSIS — G20 Parkinson's disease: Secondary | ICD-10-CM | POA: Diagnosis not present

## 2020-09-13 DIAGNOSIS — Z20822 Contact with and (suspected) exposure to covid-19: Secondary | ICD-10-CM | POA: Diagnosis not present

## 2020-09-13 DIAGNOSIS — N201 Calculus of ureter: Secondary | ICD-10-CM | POA: Diagnosis not present

## 2020-09-13 DIAGNOSIS — N210 Calculus in bladder: Secondary | ICD-10-CM | POA: Diagnosis present

## 2020-09-13 DIAGNOSIS — N320 Bladder-neck obstruction: Secondary | ICD-10-CM | POA: Diagnosis not present

## 2020-09-13 DIAGNOSIS — R310 Gross hematuria: Secondary | ICD-10-CM | POA: Diagnosis not present

## 2020-09-13 LAB — CBC
HCT: 26.9 % — ABNORMAL LOW (ref 39.0–52.0)
Hemoglobin: 8.9 g/dL — ABNORMAL LOW (ref 13.0–17.0)
MCH: 32.4 pg (ref 26.0–34.0)
MCHC: 33.1 g/dL (ref 30.0–36.0)
MCV: 97.8 fL (ref 80.0–100.0)
Platelets: 217 10*3/uL (ref 150–400)
RBC: 2.75 MIL/uL — ABNORMAL LOW (ref 4.22–5.81)
RDW: 12.3 % (ref 11.5–15.5)
WBC: 11.2 10*3/uL — ABNORMAL HIGH (ref 4.0–10.5)
nRBC: 0 % (ref 0.0–0.2)

## 2020-09-13 LAB — SURGICAL PATHOLOGY

## 2020-09-13 MED ORDER — CIPROFLOXACIN HCL 500 MG PO TABS: 500.0000 mg | ORAL_TABLET | Freq: Two times a day (BID) | ORAL | Status: DC

## 2020-09-13 MED ORDER — SULFAMETHOXAZOLE-TRIMETHOPRIM 800-160 MG PO TABS
1.0000 | ORAL_TABLET | Freq: Two times a day (BID) | ORAL | Status: DC
  Administered 2020-09-13 – 2020-09-16 (×7): 1 via ORAL
  Filled 2020-09-13 (×7): qty 1

## 2020-09-13 MED ORDER — TRAMADOL HCL 50 MG PO TABS: 50.0000 mg | ORAL_TABLET | Freq: Four times a day (QID) | ORAL | 0 refills | Status: DC | PRN

## 2020-09-13 MED ORDER — SULFAMETHOXAZOLE-TRIMETHOPRIM 800-160 MG PO TABS: 1.0000 | ORAL_TABLET | Freq: Two times a day (BID) | ORAL | 0 refills | Status: DC

## 2020-09-13 NOTE — Progress Notes (Signed)
The patient's CBI was turned off and he dropped persistent gross hematuria.  Given his ongoing gross hematuria and frailty we will plan to keep him in the hospital overnight and discharge him tomorrow morning.  We will continue to work to wean off the CBI.  I have changed the patient's admission status to inpatient given that he will be here for 2 nights.

## 2020-09-13 NOTE — Discharge Instructions (Signed)
Transurethral Resection of the Prostate (TURP) or Greenlight laser ablation of the Prostate ° °Care After ° °Refer to this sheet in the next few weeks. These discharge instructions provide you with general information on caring for yourself after you leave the hospital. Your caregiver may also give you specific instructions. Your treatment has been planned according to the most current medical practices available, but unavoidable complications sometimes occur. If you have any problems or questions after discharge, please call your caregiver. ° °HOME CARE INSTRUCTIONS  ° °Medications °· You may receive medicine for pain management. As your level of discomfort decreases, adjustments in your pain medicines may be made.  °· Take all medicines as directed.  °· You may be given a medicine (antibiotic) to kill germs following surgery. Finish all medicines. Let your caregiver know if you have any side effects or problems from the medicine.  °· If you are on aspirin, it would be best not to restart the aspirin until the blood in the urine clears °Hygiene °· You can take a shower after surgery.  °· You should not take a bath while you still have the urethral catheter. °Activity °· You will be encouraged to get out of bed as much as possible and increase your activity level as tolerated.  °· Spend the first week in and around your home. For 3 weeks, avoid the following:  °· Straining.  °· Running.  °· Strenuous work.  °· Walks longer than a few blocks.  °· Riding for extended periods.  °· Sexual relations.  °· Do not lift heavy objects (more than 20 pounds) for at least 1 month. When lifting, use your arms instead of your abdominal muscles.  °· You will be encouraged to walk as tolerated. Do not exert yourself. Increase your activity level slowly. Remember that it is important to keep moving after an operation of any type. This cuts down on the possibility of developing blood clots.  °· Your caregiver will tell you when you  can resume driving and light housework. Discuss this at your first office visit after discharge. °Diet °· No special diet is ordered after a TURP. However, if you are on a special diet for another medical problem, it should be continued.  °· Normal fluid intake is usually recommended.  °· Avoid alcohol and caffeinated drinks for 2 weeks. They irritate the bladder. Decaffeinated drinks are okay.  °· Avoid spicy foods.  °Bladder Function °· For the first 10 days, empty the bladder whenever you feel a definite desire. Do not try to hold the urine for long periods of time.  °· Urinating once or twice a night even after you are healed is not uncommon.  °· You may see some recurrence of blood in the urine after discharge from the hospital. This usually happens within 2 weeks after the procedure.If this occurs, force fluids again as you did in the hospital and reduce your activity.  °Bowel Function °· You may experience some constipation after surgery. This can be minimized by increasing fluids and fiber in your diet. Drink enough water and fluids to keep your urine clear or pale yellow.  °· A stool softener may be prescribed for use at home. Do not strain to move your bowels.  °· If you are requiring increased pain medicine, it is important that you take stool softeners to prevent constipation. This will help to promote proper healing by reducing the need to strain to move your bowels.  °Sexual Activity °· Semen movement   in the opposite direction and into the bladder (retrograde ejaculation) may occur. Since the semen passes into the bladder, cloudy urine can occur the first time you urinate after intercourse. Or, you may not have an ejaculation during erection. Ask your caregiver when you can resume sexual activity. Retrograde ejaculation and reduced semen discharge should not reduce one's pleasure of intercourse.  °Postoperative Visit °· Arrange the date and time of your after surgery visit with your caregiver.  °Return  to Work °· After your recovery is complete, you will be able to return to work and resume all activities. Your caregiver will inform you when you can return to work.  ° ° °Foley Catheter Care °A soft, flexible tube (Foley catheter) may have been placed in your bladder to drain urine and fluid. Follow these instructions: °Taking Care of the Catheter °· Keep the area where the catheter leaves your body clean.  °· Attach the catheter to the leg so there is no tension on the catheter.  °· Keep the drainage bag below the level of the bladder, but keep it OFF the floor.  °· Do not take long soaking baths. Your caregiver will give instructions about showering.  °· Wash your hands before touching ANYTHING related to the catheter or bag.  °· Using mild soap and warm water on a washcloth:  °· Clean the area closest to the catheter insertion site using a circular motion around the catheter.  °· Clean the catheter itself by wiping AWAY from the insertion site for several inches down the tube.  °· NEVER wipe upward as this could sweep bacteria up into the urethra (tube in your body that normally drains the bladder) and cause infection.  °· Place a small amount of sterile lubricant at the tip of the penis where the catheter is entering.  °Taking Care of the Drainage Bags °· Two drainage bags may be taken home: a large overnight drainage bag, and a smaller leg bag which fits underneath clothing.  °· It is okay to wear the overnight bag at any time, but NEVER wear the smaller leg bag at night.  °· Keep the drainage bag well below the level of your bladder. This prevents backflow of urine into the bladder and allows the urine to drain freely.  °· Anchor the tubing to your leg to prevent pulling or tension on the catheter. Use tape or a leg strap provided by the hospital.  °· Empty the drainage bag when it is 1/2 to 3/4 full. Wash your hands before and after touching the bag.  °· Periodically check the tubing for kinks to make sure  there is no pressure on the tubing which could restrict the flow of urine.  °Changing the Drainage Bags °· Cleanse both ends of the clean bag with alcohol before changing.  °· Pinch off the rubber catheter to avoid urine spillage during the disconnection.  °· Disconnect the dirty bag and connect the clean one.  °· Empty the dirty bag carefully to avoid a urine spill.  °· Attach the new bag to the leg with tape or a leg strap.  °Cleaning the Drainage Bags °· Whenever a drainage bag is disconnected, it must be cleaned quickly so it is ready for the next use.  °· Wash the bag in warm, soapy water.  °· Rinse the bag thoroughly with warm water.  °· Soak the bag for 30 minutes in a solution of white vinegar and water (1 cup vinegar to 1 quart warm   much need for concern. If you are uncertain, call your caregiver.  You develop side effects that you think are coming from your medicines.  SEEK IMMEDIATE MEDICAL CARE IF:  You are suddenly unable to urinate. Check to see if there are any kinks in the drainage tubing that may cause this. If you cannot find any kinks, call your caregiver immediately. This is an emergency.  You develop shortness of breath or chest pains.  Bleeding persists or clots develop in your urine.  You have a fever.  You develop pain in your back or over your lower belly (abdomen).  You develop pain or swelling in your legs.  Any problems you are having get worse rather than better.  MAKE SURE YOU:  Understand these instructions.  Will watch your condition.  Will get help right away if you are not doing well or get worse.     Foley Catheter Care A soft, flexible tube (Foley catheter) may have been placed in your bladder to drain urine and fluid. Follow these instructions: Taking Care of the  Catheter Keep the area where the catheter leaves your body clean.  Attach the catheter to the leg so there is no tension on the catheter.  Keep the drainage bag below the level of the bladder, but keep it OFF the floor.  Do not take long soaking baths. Your caregiver will give instructions about showering.  Wash your hands before touching ANYTHING related to the catheter or bag.  Using mild soap and warm water on a washcloth:  Clean the area closest to the catheter insertion site using a circular motion around the catheter.  Clean the catheter itself by wiping AWAY from the insertion site for several inches down the tube.  NEVER wipe upward as this could sweep bacteria up into the urethra (tube in your body that normally drains the bladder) and cause infection.  Place a small amount of sterile lubricant at the tip of the penis where the catheter is entering.  Taking Care of the Drainage Bags Two drainage bags may be taken home: a large overnight drainage bag, and a smaller leg bag which fits underneath clothing.  It is okay to wear the overnight bag at any time, but NEVER wear the smaller leg bag at night.  Keep the drainage bag well below the level of your bladder. This prevents backflow of urine into the bladder and allows the urine to drain freely.  Anchor the tubing to your leg to prevent pulling or tension on the catheter. Use tape or a leg strap provided by the hospital.  Empty the drainage bag when it is 1/2 to 3/4 full. Wash your hands before and after touching the bag.  Periodically check the tubing for kinks to make sure there is no pressure on the tubing which could restrict the flow of urine.  Changing the Drainage Bags Cleanse both ends of the clean bag with alcohol before changing.  Pinch off the rubber catheter to avoid urine spillage during the disconnection.  Disconnect the dirty bag and connect the clean one.  Empty the dirty bag carefully to avoid a urine spill.  Attach the  new bag to the leg with tape or a leg strap.  Cleaning the Drainage Bags Whenever a drainage bag is disconnected, it must be cleaned quickly so it is ready for the next use.  Wash the bag in warm, soapy water.  Rinse the bag thoroughly with warm water.  Soak the bag for 30  minutes in a solution of white vinegar and water (1 cup vinegar to 1 quart warm water).  Rinse with warm water.  SEEK MEDICAL CARE IF:  You have chills or night sweats.  You are leaking around your catheter or have problems with your catheter. It is not uncommon to have sporadic leakage around your catheter as a result of bladder spasms. If the leakage stops, there is not much need for concern. If you are uncertain, call your caregiver.  You develop side effects that you think are coming from your medicines.  SEEK IMMEDIATE MEDICAL CARE IF:  You are suddenly unable to urinate. Check to see if there are any kinks in the drainage tubing that may cause this. If you cannot find any kinks, call your caregiver immediately. This is an emergency.  You develop shortness of breath or chest pains.  Bleeding persists or clots develop in your urine.  You have a fever.  You develop pain in your back or over your lower belly (abdomen).  You develop pain or swelling in your legs.  Any problems you are having get worse rather than better.  MAKE SURE YOU:  Understand these instructions.  Will watch your condition.  Will get help right away if you are not doing well or get worse.

## 2020-09-13 NOTE — Progress Notes (Signed)
Urology Inpatient Progress Report  Gross hematuria [R31.0]  Procedure(s): CYSTOSCOPY WITH LITHOLAPAXY, LEFT RETROGRADE PYELOGRAMS, LEFT URETEROSCOPY, AND STENT PLACEMENT TRANSURETHRAL RESECTION OF THE PROSTATE (TURP)  1 Day Post-Op   Intv/Subj: No acute events overnight. Patient is without complaint. Transferred to the main hospital urology floor from the surgery center recovery because of concern for nursing acuity.  Overnight the patient required no hand irrigation and had no problems with catheter occlusion.  He has no complaints this morning.  Active Problems:   Gross hematuria  Current Facility-Administered Medications  Medication Dose Route Frequency Provider Last Rate Last Admin   0.45 % sodium chloride infusion   Intravenous Continuous Ardis Hughs, MD 100 mL/hr at 09/13/20 0546 New Bag at 09/13/20 0546   ALPRAZolam Duanne Moron) tablet 0.5 mg  0.5 mg Oral BID PRN Ardis Hughs, MD       belladonna-opium (B&O) suppository 16.2-'60mg'$   1 suppository Rectal Q8H PRN Ardis Hughs, MD   1 suppository at 09/12/20 2116   carbidopa-levodopa (SINEMET IR) 25-100 MG per tablet immediate release 2 tablet  2 tablet Oral 5 X Daily Ardis Hughs, MD   2 tablet at 09/13/20 0559   Chlorhexidine Gluconate Cloth 2 % PADS 6 each  6 each Topical Daily Ardis Hughs, MD   6 each at 09/12/20 2201   escitalopram (LEXAPRO) tablet 20 mg  20 mg Oral q morning Ardis Hughs, MD       fludrocortisone (FLORINEF) tablet 0.1 mg  0.1 mg Oral BID Ardis Hughs, MD   0.1 mg at 09/12/20 2159   ondansetron (ZOFRAN) tablet 4 mg  4 mg Oral Q8H PRN Ardis Hughs, MD       rasagiline (AZILECT) tablet 1 mg  1 mg Oral Daily Ardis Hughs, MD   1 mg at 09/12/20 2159   sodium chloride irrigation 0.9 % 3,000 mL  3,000 mL Irrigation Continuous Ardis Hughs, MD   3,000 mL at 09/13/20 0550   traMADol (ULTRAM) tablet 50-100 mg  50-100 mg Oral Q6H PRN Ardis Hughs,  MD         Objective: Vital: Vitals:   09/12/20 1857 09/12/20 2243 09/13/20 0316 09/13/20 0642  BP: 121/81 113/60 (!) 97/57 (!) 93/56  Pulse: 96 76 67 63  Resp: '20 18 18 16  '$ Temp: 98 F (36.7 C) 97.9 F (36.6 C) 97.7 F (36.5 C) 98.2 F (36.8 C)  TempSrc:      SpO2: 99% 97% 98% 98%  Weight:      Height:       I/Os: I/O last 3 completed shifts: In: 40880.4 [I.V.:3030.4; [id number] Out: 57500 [id number]; Blood:200]  Physical Exam:  General: Patient is in no apparent distress Lungs: Normal respiratory effort, chest expands symmetrically. GI: The abdomen is soft and nontender Foley: Draining clear E flux on moderate to slow CBI. Ext: lower extremities symmetric  Lab Results: Recent Labs    09/12/20 1317 09/13/20 0503  WBC 9.9 11.2*  HGB 11.9* 8.9*  HCT 36.7* 26.9*   No results for input(s): NA, K, CL, CO2, GLUCOSE, BUN, CREATININE, CALCIUM in the last 72 hours. No results for input(s): LABPT, INR in the last 72 hours. No results for input(s): LABURIN in the last 72 hours. Results for orders placed or performed in visit on 09/10/20  SARS Coronavirus 2 (TAT 6-24 hrs)     Status: None   Collection Time: 09/10/20 12:00 AM  Result Value Ref Range Status  SARS Coronavirus 2 RESULT: NEGATIVE  Final    Comment: RESULT: NEGATIVESARS-CoV-2 INTERPRETATION:A NEGATIVE  test result means that SARS-CoV-2 RNA was not present in the specimen above the limit of detection of this test. This does not preclude a possible SARS-CoV-2 infection and should not be used as the  sole basis for patient management decisions. Negative results must be combined with clinical observations, patient history, and epidemiological information. Optimum specimen types and timing for peak viral levels during infections caused by SARS-CoV-2  have not been determined. Collection of multiple specimens or types of specimens may be necessary to detect virus. Improper specimen collection and handling, sequence  variability under primers/probes, or organism present below the limit of detection may  lead to false negative results. Positive and negative predictive values of testing are highly dependent on prevalence. False negative test results are more likely when prevalence of disease is high.The expected result is NEGATIVE.Fact S heet for  Healthcare Providers: LocalChronicle.no Sheet for Patients: SalonLookup.es Reference Range - Negative     Studies/Results: No results found.  Assessment: Procedure(s): CYSTOSCOPY WITH LITHOLAPAXY, LEFT RETROGRADE PYELOGRAMS, LEFT URETEROSCOPY, AND STENT PLACEMENT TRANSURETHRAL RESECTION OF THE PROSTATE (TURP), 1 Day Post-Op  doing well.  Plan: Plan to wean the CBI irrigation and reevaluate for discharge this afternoon.   Louis Meckel, MD Urology 09/13/2020, 9:56 AM

## 2020-09-14 LAB — CBC
HCT: 26.7 % — ABNORMAL LOW (ref 39.0–52.0)
Hemoglobin: 8.5 g/dL — ABNORMAL LOW (ref 13.0–17.0)
MCH: 32 pg (ref 26.0–34.0)
MCHC: 31.8 g/dL (ref 30.0–36.0)
MCV: 100.4 fL — ABNORMAL HIGH (ref 80.0–100.0)
Platelets: 197 10*3/uL (ref 150–400)
RBC: 2.66 MIL/uL — ABNORMAL LOW (ref 4.22–5.81)
RDW: 12.2 % (ref 11.5–15.5)
WBC: 10.3 10*3/uL (ref 4.0–10.5)
nRBC: 0 % (ref 0.0–0.2)

## 2020-09-14 NOTE — Plan of Care (Signed)
  Problem: Education: Goal: Knowledge of General Education information will improve Description: Including pain rating scale, medication(s)/side effects and non-pharmacologic comfort measures Outcome: Progressing   Problem: Clinical Measurements: Goal: Ability to maintain clinical measurements within normal limits will improve Outcome: Progressing   Problem: Safety: Goal: Ability to remain free from injury will improve Outcome: Progressing   

## 2020-09-14 NOTE — Discharge Summary (Addendum)
[facility] Discharge Summary  Admit date: 09/12/2020  Discharge date and time: 09/14/20   Discharge to: Home  Discharge Service: Urology  Discharge Attending Physician:  Link Snuffer, MD  Discharge  Diagnoses: <principal problem not specified>  Secondary Diagnosis: Active Problems:   Gross hematuria   Bladder stones   OR Procedures: Procedure(s): CYSTOSCOPY WITH LITHOLAPAXY, LEFT RETROGRADE PYELOGRAMS, LEFT URETEROSCOPY, AND STENT PLACEMENT TRANSURETHRAL RESECTION OF THE PROSTATE (TURP) 09/12/2020   Ancillary Procedures: None   Discharge Day Services: The patient was seen and examined by the Urology team both in the morning and immediately prior to discharge.  Vital signs and laboratory values were stable and within normal limits.  The physical exam was benign and unchanged and all surgical wounds were examined.  Discharge instructions were explained and all questions answered.  Subjective  No acute events overnight. Pain Controlled. No fever or chills.  Objective Patient Vitals for the past 8 hrs:  BP Temp Temp src Pulse Resp SpO2  09/14/20 0437 (!) 103/51 98.4 F (36.9 C) Oral 88 18 97 %   No intake/output data recorded.  General Appearance:        No acute distress Lungs:                       Normal work of breathing on room air Heart:                                Regular rate and rhythm Abdomen:                         Soft, non-tender, non-distended GU.  Foley catheter in place draining light pink urine output with CBI clamped Extremities:                      Warm and well perfused   Hospital Course:  The patient underwent cystolitholopaxy, TURP, ureteroscopic stone extraction on 09/12/2020.  The patient tolerated the procedure well, was extubated in the OR, and afterwards was taken to the PACU for routine post-surgical care. When stable the patient was transferred to the floor.   The patient did well postoperatively.  The patient's diet was slowly advanced  and at the time of discharge was tolerating a regular diet.  The patient was discharged home 2 Days Post-Op, at which point was tolerating a regular solid diet,  have adequate pain control with P.O. pain medication, and could ambulate without difficulty. The patient will follow up with [place] for post op check.   Hemoglobin on postop day 2 was 8.5.  Foley catheter draining light pink with CBI clamped.  Patient will discharge home on Bactrim and as needed tramadol with plan to follow-up with [place] on 09/16/2020 for trial of void.  Condition at Discharge: Improved  Discharge Medications:  Allergies as of 09/14/2020       Reactions   Rivastigmine Tartrate Nausea And Vomiting    Can tolerate the patch but can not tolerate the pills   Tamsulosin Other (See Comments)   Severe joint pain   Alfuzosin    Other reaction(s): Other (See Comments) hypotension        Medication List     TAKE these medications    ALPRAZolam 0.5 MG tablet Commonly known as: XANAX Take 0.5 mg by mouth 2 (two) times daily as needed for anxiety.   AZO TABS  PO Take 1 tablet by mouth daily as needed (pain).   carbidopa-levodopa 25-100 MG tablet Commonly known as: SINEMET IR Take 2 tablets by mouth 5 (five) times daily.   escitalopram 20 MG tablet Commonly known as: LEXAPRO Take 20 mg by mouth every morning.   rasagiline 1 MG Tabs tablet Commonly known as: AZILECT Take 1 mg by mouth daily.   sulfamethoxazole-trimethoprim 800-160 MG tablet Commonly known as: BACTRIM DS Take 1 tablet by mouth every 12 (twelve) hours.   traMADol 50 MG tablet Commonly known as: ULTRAM Take 1-2 tablets (50-100 mg total) by mouth every 6 (six) hours as needed for moderate pain.   triamcinolone cream 0.1 % Commonly known as: KENALOG Apply 1 application topically daily as needed (eczema).       ASK your doctor about these medications    fludrocortisone 0.1 MG tablet Commonly known as: FLORINEF TAKE 2 TABLETS (0.2 MG TOTAL)  BY MOUTH DAILY.

## 2020-09-15 LAB — HEMOGLOBIN AND HEMATOCRIT, BLOOD
HCT: 26 % — ABNORMAL LOW (ref 39.0–52.0)
Hemoglobin: 8.2 g/dL — ABNORMAL LOW (ref 13.0–17.0)

## 2020-09-15 NOTE — Plan of Care (Signed)
  Problem: Health Behavior/Discharge Planning: Goal: Ability to manage health-related needs will improve Outcome: Progressing   Problem: Activity: Goal: Risk for activity intolerance will decrease Outcome: Progressing   Problem: Nutrition: Goal: Adequate nutrition will be maintained Outcome: Progressing   Problem: Coping: Goal: Level of anxiety will decrease Outcome: Progressing   Problem: Elimination: Goal: Will not experience complications related to urinary retention Outcome: Progressing   Problem: Safety: Goal: Ability to remain free from injury will improve Outcome: Adequate for Discharge

## 2020-09-15 NOTE — Progress Notes (Addendum)
Urology Inpatient Progress Report  Gross hematuria [R31.0] Bladder stones [N21.0]  Procedure(s): CYSTOSCOPY WITH LITHOLAPAXY, LEFT RETROGRADE PYELOGRAMS, LEFT URETEROSCOPY, AND STENT PLACEMENT TRANSURETHRAL RESECTION OF THE PROSTATE (TURP)  3 Days Post-Op   Intv/Subj: No acute events overnight.  Was to be discharged yesterday as his urine was clearing .  However the urine darkened in the afternoon and CBI was started again.  Irrigated at the bedside with very minimal old clot return yesterday evening and this morning.  His vital signs remained stable.  Hemoglobin is 8.2 today from 8.5 yesterday.  Remains on a slow drip CBI.  Active Problems:   Gross hematuria   Bladder stones  Current Facility-Administered Medications  Medication Dose Route Frequency Provider Last Rate Last Admin   0.45 % sodium chloride infusion   Intravenous Continuous Ardis Hughs, MD   Stopped at 09/14/20 1910   ALPRAZolam Duanne Moron) tablet 0.5 mg  0.5 mg Oral BID PRN Ardis Hughs, MD       belladonna-opium (B&O) suppository 16.2-'60mg'$   1 suppository Rectal Q8H PRN Ardis Hughs, MD   1 suppository at 09/14/20 1737   carbidopa-levodopa (SINEMET IR) 25-100 MG per tablet immediate release 2 tablet  2 tablet Oral 5 X Daily Ardis Hughs, MD   2 tablet at 09/15/20 [id number]   Chlorhexidine Gluconate Cloth 2 % PADS 6 each  6 each Topical Daily Ardis Hughs, MD   6 each at 09/14/20 1102   escitalopram (LEXAPRO) tablet 20 mg  20 mg Oral q morning Ardis Hughs, MD   20 mg at 09/14/20 1044   fludrocortisone (FLORINEF) tablet 0.1 mg  0.1 mg Oral BID Ardis Hughs, MD   0.1 mg at 09/14/20 2317   ondansetron (ZOFRAN) tablet 4 mg  4 mg Oral Q8H PRN Ardis Hughs, MD       rasagiline (AZILECT) tablet 1 mg  1 mg Oral Daily Ardis Hughs, MD   1 mg at 09/14/20 1032   sodium chloride irrigation 0.9 % 3,000 mL  3,000 mL Irrigation Continuous Ardis Hughs, MD   3,000 mL at  09/13/20 2322   sulfamethoxazole-trimethoprim (BACTRIM DS) 800-160 MG per tablet 1 tablet  1 tablet Oral Q12H Ardis Hughs, MD   1 tablet at 09/14/20 2316   traMADol (ULTRAM) tablet 50-100 mg  50-100 mg Oral Q6H PRN Ardis Hughs, MD         Objective: Vital: Vitals:   09/14/20 0437 09/14/20 1837 09/14/20 2149 09/15/20 0528  BP: (!) 103/51 (!) 101/51 (!) 94/58 (!) 105/53  Pulse: 88 83 81 79  Resp: '18 18 18 18  '$ Temp: 98.4 F (36.9 C) 98.2 F (36.8 C) 98.5 F (36.9 C) 97.8 F (36.6 C)  TempSrc: Oral Oral Oral   SpO2: 97% 96% 97% 98%  Weight:      Height:       I/Os: I/O last 3 completed shifts: In: 2060 [P.O.:360; Other:1700] Out: 12400 [Urine:12400]  Physical Exam:  General: Patient is in no apparent distress Lungs: Normal respiratory effort, chest expands symmetrically. GI: The abdomen is soft and nontender Foley: Draining clear pink E flux on slow CBI. Ext: lower extremities symmetric  Lab Results: Recent Labs    09/12/20 1317 09/13/20 0503 09/14/20 0541 09/15/20 0511  WBC 9.9 11.2* 10.3  --   HGB 11.9* 8.9* 8.5* 8.2*  HCT 36.7* 26.9* 26.7* 26.0*    No results for input(s): NA, K, CL, CO2, GLUCOSE, BUN, CREATININE, CALCIUM  in the last 72 hours. No results for input(s): LABPT, INR in the last 72 hours. No results for input(s): LABURIN in the last 72 hours. Results for orders placed or performed in visit on 09/10/20  SARS Coronavirus 2 (TAT 6-24 hrs)     Status: None   Collection Time: 09/10/20 12:00 AM  Result Value Ref Range Status   SARS Coronavirus 2 RESULT: NEGATIVE  Final    Comment: RESULT: NEGATIVESARS-CoV-2 INTERPRETATION:A NEGATIVE  test result means that SARS-CoV-2 RNA was not present in the specimen above the limit of detection of this test. This does not preclude a possible SARS-CoV-2 infection and should not be used as the  sole basis for patient management decisions. Negative results must be combined with clinical observations,  patient history, and epidemiological information. Optimum specimen types and timing for peak viral levels during infections caused by SARS-CoV-2  have not been determined. Collection of multiple specimens or types of specimens may be necessary to detect virus. Improper specimen collection and handling, sequence variability under primers/probes, or organism present below the limit of detection may  lead to false negative results. Positive and negative predictive values of testing are highly dependent on prevalence. False negative test results are more likely when prevalence of disease is high.The expected result is NEGATIVE.Fact S heet for  Healthcare Providers: LocalChronicle.no Sheet for Patients: SalonLookup.es Reference Range - Negative     Studies/Results: No results found.  Assessment: Procedure(s): CYSTOSCOPY WITH LITHOLAPAXY, LEFT RETROGRADE PYELOGRAMS, LEFT URETEROSCOPY, AND STENT PLACEMENT TRANSURETHRAL RESECTION OF THE PROSTATE (TURP), 3 Days Post-Op  doing well.  Plan: Plan to wean the CBI irrigation  Check hemoglobin tomorrow morning and if stable and CBI able to be clamped plan for discharge tomorrow.    09/15/2020, 8:43 AM

## 2020-09-15 NOTE — Significant Event (Signed)
I was called by nursing to the patient's bedside because his catheter had obstructed. Nursing manually irrigated and achieved about 50cc clot burden after which the catheter flowed freely. Was clear yellow on fast drip CBI during my evaluation. I manually irrigated and did not achieve any further clot return. Light pink UOP on slow CBI. Will continue to monitor and check H&H in the morning Continue slow CBI over night.   Bishop Limbo, MD [facility]

## 2020-09-15 NOTE — Discharge Summary (Signed)
[facility] Discharge Summary  Admit date: 09/12/2020  Discharge date and time: 09/16/20   Discharge to: Home  Discharge Service: Urology  Discharge Attending Physician:  Link Snuffer, MD  Discharge  Diagnoses:  Bladder stones, Left ureteral stone, left renal stones BPH w/ LUTS - frequency/urgency Gross hematuria  Secondary Diagnosis: Active Problems:   Gross hematuria   Bladder stones   OR Procedures: Procedure(s): CYSTOSCOPY WITH LITHOLAPAXY, LEFT RETROGRADE PYELOGRAMS, LEFT URETEROSCOPY, AND STENT PLACEMENT TRANSURETHRAL RESECTION OF THE PROSTATE (TURP) 09/12/2020   Ancillary Procedures: None   Discharge Day Services: The patient was seen and examined by the Urology team both in the morning and immediately prior to discharge.  Vital signs and laboratory values were stable and within normal limits.  The physical exam was benign and unchanged and all surgical wounds were examined.  Discharge instructions were explained and all questions answered.  The urine was draining blood tinged urine.    Subjective  No acute events overnight. Pain Controlled. No fever or chills.  Objective Patient Vitals for the past 8 hrs:  BP Temp Temp src SpO2  09/16/20 0420 110/64 98.6 F (37 C) Oral 93 %   No intake/output data recorded.  General Appearance:        No acute distress Lungs:                       Normal work of breathing on room air Heart:                                Regular rate and rhythm Abdomen:                         Soft, non-tender, non-distended GU.  Foley catheter in place draining blood tinged urine output with CBI clamped Extremities:                      Warm and well perfused   Hospital Course:  The patient underwent cystolitholopaxy, TURP, ureteroscopic stone extraction on 09/12/2020.  The patient tolerated the procedure well, was extubated in the OR, and afterwards was taken to the PACU for routine post-surgical care. When stable the patient was  transferred to the floor.   The patient did well postoperatively.  The patient's diet was slowly advanced and at the time of discharge was tolerating a regular diet.  The patient was discharged home 4 Days Post-Op, at which point was tolerating a regular solid diet,  have adequate pain control with P.O. pain medication, and could ambulate without difficulty. The patient will follow up with [place] for post op check.   Hemoglobin on postop day 4 was 8.3.  Foley catheter draining blood tinged with CBI clamped.  He had a large BM prior to discharge.  Patient will discharge home on Bactrim and as needed tramadol with plan to follow-up with [place] on 09/23/2020 for trial of void.  Condition at Discharge: Improved  Discharge Medications:  Allergies as of 09/16/2020       Reactions   Rivastigmine Tartrate Nausea And Vomiting    Can tolerate the patch but can not tolerate the pills   Tamsulosin Other (See Comments)   Severe joint pain   Alfuzosin    Other reaction(s): Other (See Comments) hypotension        Medication List     TAKE these medications  ALPRAZolam 0.5 MG tablet Commonly known as: XANAX Take 0.5 mg by mouth 2 (two) times daily as needed for anxiety.   AZO TABS PO Take 1 tablet by mouth daily as needed (pain).   carbidopa-levodopa 25-100 MG tablet Commonly known as: SINEMET IR Take 2 tablets by mouth 5 (five) times daily.   escitalopram 20 MG tablet Commonly known as: LEXAPRO Take 20 mg by mouth every morning.   ferrous sulfate 325 (65 FE) MG tablet Take 1 tablet (325 mg total) by mouth daily.   rasagiline 1 MG Tabs tablet Commonly known as: AZILECT Take 1 mg by mouth daily.   sulfamethoxazole-trimethoprim 800-160 MG tablet Commonly known as: BACTRIM DS Take 1 tablet by mouth every 12 (twelve) hours.   traMADol 50 MG tablet Commonly known as: ULTRAM Take 1-2 tablets (50-100 mg total) by mouth every 6 (six) hours as needed for moderate pain.   triamcinolone cream 0.1  % Commonly known as: KENALOG Apply 1 application topically daily as needed (eczema).       ASK your doctor about these medications    fludrocortisone 0.1 MG tablet Commonly known as: FLORINEF TAKE 2 TABLETS (0.2 MG TOTAL) BY MOUTH DAILY.

## 2020-09-15 NOTE — Progress Notes (Signed)
Urology Inpatient Progress Report  Gross hematuria [R31.0] Bladder stones [N21.0]  Procedure(s): CYSTOSCOPY WITH LITHOLAPAXY, LEFT RETROGRADE PYELOGRAMS, LEFT URETEROSCOPY, AND STENT PLACEMENT TRANSURETHRAL RESECTION OF THE PROSTATE (TURP)  3 Days Post-Op   Intv/Subj: No acute events overnight.  Was to be discharged yesterday as his urine was clearing and his hemoglobin was stable.  However catheter was draining small clots (likely old clots) patient kept for manual irrigation and slow CBI overnight.  Active Problems:   Gross hematuria   Bladder stones  Current Facility-Administered Medications  Medication Dose Route Frequency Provider Last Rate Last Admin   0.45 % sodium chloride infusion   Intravenous Continuous Ardis Hughs, MD   Stopped at 09/14/20 1910   ALPRAZolam Duanne Moron) tablet 0.5 mg  0.5 mg Oral BID PRN Ardis Hughs, MD       belladonna-opium (B&O) suppository 16.2-'60mg'$   1 suppository Rectal Q8H PRN Ardis Hughs, MD   1 suppository at 09/14/20 1737   carbidopa-levodopa (SINEMET IR) 25-100 MG per tablet immediate release 2 tablet  2 tablet Oral 5 X Daily Ardis Hughs, MD   2 tablet at 09/15/20 [id number]   Chlorhexidine Gluconate Cloth 2 % PADS 6 each  6 each Topical Daily Ardis Hughs, MD   6 each at 09/14/20 1102   escitalopram (LEXAPRO) tablet 20 mg  20 mg Oral q morning Ardis Hughs, MD   20 mg at 09/14/20 1044   fludrocortisone (FLORINEF) tablet 0.1 mg  0.1 mg Oral BID Ardis Hughs, MD   0.1 mg at 09/14/20 2317   ondansetron (ZOFRAN) tablet 4 mg  4 mg Oral Q8H PRN Ardis Hughs, MD       rasagiline (AZILECT) tablet 1 mg  1 mg Oral Daily Ardis Hughs, MD   1 mg at 09/14/20 1032   sodium chloride irrigation 0.9 % 3,000 mL  3,000 mL Irrigation Continuous Ardis Hughs, MD   3,000 mL at 09/13/20 2322   sulfamethoxazole-trimethoprim (BACTRIM DS) 800-160 MG per tablet 1 tablet  1 tablet Oral Q12H Ardis Hughs,  MD   1 tablet at 09/14/20 2316   traMADol (ULTRAM) tablet 50-100 mg  50-100 mg Oral Q6H PRN Ardis Hughs, MD         Objective: Vital: Vitals:   09/14/20 0437 09/14/20 1837 09/14/20 2149 09/15/20 0528  BP: (!) 103/51 (!) 101/51 (!) 94/58 (!) 105/53  Pulse: 88 83 81 79  Resp: '18 18 18 18  '$ Temp: 98.4 F (36.9 C) 98.2 F (36.8 C) 98.5 F (36.9 C) 97.8 F (36.6 C)  TempSrc: Oral Oral Oral   SpO2: 97% 96% 97% 98%  Weight:      Height:       I/Os: I/O last 3 completed shifts: In: 2060 [P.O.:360; Other:1700] Out: 12400 [Urine:12400]  Physical Exam:  General: Patient is in no apparent distress Lungs: Normal respiratory effort, chest expands symmetrically. GI: The abdomen is soft and nontender Foley: Draining clear E flux on moderate to slow CBI. Ext: lower extremities symmetric  Lab Results: Recent Labs    09/12/20 1317 09/13/20 0503 09/14/20 0541 09/15/20 0511  WBC 9.9 11.2* 10.3  --   HGB 11.9* 8.9* 8.5* 8.2*  HCT 36.7* 26.9* 26.7* 26.0*   No results for input(s): NA, K, CL, CO2, GLUCOSE, BUN, CREATININE, CALCIUM in the last 72 hours. No results for input(s): LABPT, INR in the last 72 hours. No results for input(s): LABURIN in the last 72 hours. Results  for orders placed or performed in visit on 09/10/20  SARS Coronavirus 2 (TAT 6-24 hrs)     Status: None   Collection Time: 09/10/20 12:00 AM  Result Value Ref Range Status   SARS Coronavirus 2 RESULT: NEGATIVE  Final    Comment: RESULT: NEGATIVESARS-CoV-2 INTERPRETATION:A NEGATIVE  test result means that SARS-CoV-2 RNA was not present in the specimen above the limit of detection of this test. This does not preclude a possible SARS-CoV-2 infection and should not be used as the  sole basis for patient management decisions. Negative results must be combined with clinical observations, patient history, and epidemiological information. Optimum specimen types and timing for peak viral levels during infections caused by  SARS-CoV-2  have not been determined. Collection of multiple specimens or types of specimens may be necessary to detect virus. Improper specimen collection and handling, sequence variability under primers/probes, or organism present below the limit of detection may  lead to false negative results. Positive and negative predictive values of testing are highly dependent on prevalence. False negative test results are more likely when prevalence of disease is high.The expected result is NEGATIVE.Fact S heet for  Healthcare Providers: LocalChronicle.no Sheet for Patients: SalonLookup.es Reference Range - Negative     Studies/Results: No results found.  Assessment: Procedure(s): CYSTOSCOPY WITH LITHOLAPAXY, LEFT RETROGRADE PYELOGRAMS, LEFT URETEROSCOPY, AND STENT PLACEMENT TRANSURETHRAL RESECTION OF THE PROSTATE (TURP), 3 Days Post-Op  doing well.  Plan: Plan to wean the CBI irrigation and reevaluate for discharge in the AM    09/15/2020, 7:24 AM

## 2020-09-16 ENCOUNTER — Encounter (HOSPITAL_BASED_OUTPATIENT_CLINIC_OR_DEPARTMENT_OTHER): Payer: Self-pay | Admitting: Urology

## 2020-09-16 LAB — HEMOGLOBIN AND HEMATOCRIT, BLOOD
HCT: 25.6 % — ABNORMAL LOW (ref 39.0–52.0)
Hemoglobin: 8.3 g/dL — ABNORMAL LOW (ref 13.0–17.0)

## 2020-09-16 MED ORDER — BISACODYL 5 MG PO TBEC
5.0000 mg | DELAYED_RELEASE_TABLET | Freq: Once | ORAL | Status: AC
  Administered 2020-09-16: 5 mg via ORAL
  Filled 2020-09-16: qty 1

## 2020-09-16 MED ORDER — BISACODYL 10 MG RE SUPP
10.0000 mg | Freq: Once | RECTAL | Status: AC
  Administered 2020-09-16: 10 mg via RECTAL
  Filled 2020-09-16: qty 1

## 2020-09-16 MED ORDER — FERROUS SULFATE 325 (65 FE) MG PO TABS: 325.0000 mg | ORAL_TABLET | Freq: Every day | ORAL | 1 refills | Status: DC

## 2020-09-16 NOTE — Progress Notes (Signed)
Urology Inpatient Progress Report  Gross hematuria [R31.0] Bladder stones [N21.0]  Procedure(s): CYSTOSCOPY WITH LITHOLAPAXY, LEFT RETROGRADE PYELOGRAMS, LEFT URETEROSCOPY, AND STENT PLACEMENT TRANSURETHRAL RESECTION OF THE PROSTATE (TURP)  4 Days Post-Op   Intv/Subj: No acute events overnight.   Irrigated prior to shift change last night for small clots Remained on gentle CBI overnight No BM since admission Moving around room, hasn't really done much walking.   Active Problems:   Gross hematuria   Bladder stones  Current Facility-Administered Medications  Medication Dose Route Frequency Provider Last Rate Last Admin   ALPRAZolam Duanne Moron) tablet 0.5 mg  0.5 mg Oral BID PRN Ardis Hughs, MD       belladonna-opium (B&O) suppository 16.2-'60mg'$   1 suppository Rectal Q8H PRN Ardis Hughs, MD   1 suppository at 09/15/20 2000   bisacodyl (DULCOLAX) EC tablet 5 mg  5 mg Oral Once Ardis Hughs, MD       bisacodyl (DULCOLAX) suppository 10 mg  10 mg Rectal Once Ardis Hughs, MD       carbidopa-levodopa (SINEMET IR) 25-100 MG per tablet immediate release 2 tablet  2 tablet Oral 5 X Daily Ardis Hughs, MD   2 tablet at 09/16/20 [id number]   Chlorhexidine Gluconate Cloth 2 % PADS 6 each  6 each Topical Daily Ardis Hughs, MD   6 each at 09/15/20 1000   escitalopram (LEXAPRO) tablet 20 mg  20 mg Oral q morning Ardis Hughs, MD   20 mg at 09/15/20 1258   fludrocortisone (FLORINEF) tablet 0.1 mg  0.1 mg Oral BID Ardis Hughs, MD   0.1 mg at 09/15/20 2152   ondansetron (ZOFRAN) tablet 4 mg  4 mg Oral Q8H PRN Ardis Hughs, MD       rasagiline (AZILECT) tablet 1 mg  1 mg Oral Daily Ardis Hughs, MD   1 mg at 09/15/20 0851   sodium chloride irrigation 0.9 % 3,000 mL  3,000 mL Irrigation Continuous Ardis Hughs, MD   3,000 mL at 09/15/20 1502   sulfamethoxazole-trimethoprim (BACTRIM DS) 800-160 MG per tablet 1 tablet  1 tablet Oral  Q12H Ardis Hughs, MD   1 tablet at 09/15/20 2152   traMADol (ULTRAM) tablet 50-100 mg  50-100 mg Oral Q6H PRN Ardis Hughs, MD         Objective: Vital: Vitals:   09/15/20 0528 09/15/20 1243 09/15/20 2148 09/16/20 0420  BP: (!) 105/53 (!) 110/51 (!) 117/57 110/64  Pulse: 79 80 85   Resp: '18 18 18   '$ Temp: 97.8 F (36.6 C) 97.9 F (36.6 C) 98.5 F (36.9 C) 98.6 F (37 C)  TempSrc:   Oral Oral  SpO2: 98% 97%  93%  Weight:      Height:       I/Os: I/O last 3 completed shifts: In: 9740 [P.O.:740; Other:9000] Out: 17025 [id number]  Physical Exam:  General: Patient is in no apparent distress Lungs: Normal respiratory effort, chest expands symmetrically. GI: The abdomen is soft and nontender Foley: Draining clear pink E flux on slow CBI. Ext: lower extremities symmetric  Lab Results: Recent Labs    09/14/20 0541 09/15/20 0511 09/16/20 0421  WBC 10.3  --   --   HGB 8.5* 8.2* 8.3*  HCT 26.7* 26.0* 25.6*   No results for input(s): NA, K, CL, CO2, GLUCOSE, BUN, CREATININE, CALCIUM in the last 72 hours. No results for input(s): LABPT, INR in the last 72 hours. No  results for input(s): LABURIN in the last 72 hours. Results for orders placed or performed in visit on 09/10/20  SARS Coronavirus 2 (TAT 6-24 hrs)     Status: None   Collection Time: 09/10/20 12:00 AM  Result Value Ref Range Status   SARS Coronavirus 2 RESULT: NEGATIVE  Final    Comment: RESULT: NEGATIVESARS-CoV-2 INTERPRETATION:A NEGATIVE  test result means that SARS-CoV-2 RNA was not present in the specimen above the limit of detection of this test. This does not preclude a possible SARS-CoV-2 infection and should not be used as the  sole basis for patient management decisions. Negative results must be combined with clinical observations, patient history, and epidemiological information. Optimum specimen types and timing for peak viral levels during infections caused by SARS-CoV-2  have not been  determined. Collection of multiple specimens or types of specimens may be necessary to detect virus. Improper specimen collection and handling, sequence variability under primers/probes, or organism present below the limit of detection may  lead to false negative results. Positive and negative predictive values of testing are highly dependent on prevalence. False negative test results are more likely when prevalence of disease is high.The expected result is NEGATIVE.Fact S heet for  Healthcare Providers: LocalChronicle.no Sheet for Patients: SalonLookup.es Reference Range - Negative     Studies/Results: No results found.  Assessment: Procedure(s): CYSTOSCOPY WITH LITHOLAPAXY, LEFT RETROGRADE PYELOGRAMS, LEFT URETEROSCOPY, AND STENT PLACEMENT TRANSURETHRAL RESECTION OF THE PROSTATE (TURP), 4 Days Post-Op  doing well.  Plan: Plan to  stop CBI irrigation  Aggressive bowel regimen Hope to discharge with catheter today.    09/16/2020, 8:19 AM

## 2021-10-27 ENCOUNTER — Ambulatory Visit
Admission: RE | Admit: 2021-10-27 | Discharge: 2021-10-27 | Disposition: A | Discharge status: Home / Self Care | Payer: Medicare Other | Source: Ambulatory Visit | Attending: Nurse Practitioner | Admitting: Nurse Practitioner

## 2021-10-27 VITALS — BP 104/64 | HR 79 | Temp 98.1°F | Resp 20

## 2021-10-27 DIAGNOSIS — Z1152 Encounter for screening for COVID-19: Secondary | ICD-10-CM | POA: Insufficient documentation

## 2021-10-27 DIAGNOSIS — J069 Acute upper respiratory infection, unspecified: Secondary | ICD-10-CM | POA: Diagnosis present

## 2021-10-27 MED ORDER — BENZONATATE 100 MG PO CAPS: 100.0000 mg | ORAL_CAPSULE | Freq: Three times a day (TID) | ORAL | 0 refills | Status: DC | PRN

## 2021-10-27 NOTE — ED Provider Notes (Signed)
[facility]    CSN: [id number] Arrival date & time: 10/27/21  1223      History   Chief Complaint Chief Complaint  Patient presents with   Sore Throat    cough, HA - Entered by patient   Cough    HPI [name] [name] is a 76 y.o. male.   Patient presents with cough and sore throat for the past couple of days.  He denies fever, shortness of breath, wheezing, chest pain or tightness, runny nose, sinus pressure, ear pain or pressure, abdominal pain, nausea/vomiting, loss of taste or smell, and new rash.  He endorses body aches, sweating on and off, congested cough, nasal and chest congestion, postnasal drainage, sore throat, headache, decreased appetite, fatigue.  Reports his wife currently has COVID-19 and is taking the antiviral therapy.  He has taken Mucinex for his symptoms which helped temporarily.  He has also been trying to drink fluids because his appetite has been decreased.  Reports he is a little bit dizzy, however has not fallen or passed out.    He has a history of Parkinson's disease, orthostatic hypotension, BPH, GERD, history of pneumonia.     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-29-2012   Sleep behavior disorder, REM    Wears glasses    Wears hearing aid    BILATERAL-- INTERMITTANT WEARS    Patient Active Problem List   Diagnosis Date Noted   Bladder stones 09/13/2020   Gross hematuria 09/12/2020    Orthostatic hypotension 10/27/2019   Acute pyelonephritis 12/16/2012   Sepsis ([place]) 12/15/2012   Parkinson's disease ([place]) 12/15/2012    Past Surgical History:  Procedure Laterality Date   CHOLECYSTECTOMY  12/21/2014   Procedure: LAPAROSCOPIC CHOLECYSTECTOMY;  Surgeon: Ralene Ok, MD;  Location: [place];  Service: General;;   COLONOSCOPY     CYSTO/  LEFT URETEROSOCPY/   LEFT URETERAL STENT PLACEMENT/  LASER BLADDER STONES AND EXTRACTION  09-25-2010   CYSTOSCOPY WITH INSERTION OF UROLIFT N/A 03/11/2017   Procedure: CYSTOSCOPY WITH INSERTION OF UROLIFT, [place];  Surgeon: Franchot Gallo, MD;  Location: [facility];  Service: Urology;  Laterality: N/A;   CYSTOSCOPY WITH LITHOLAPAXY N/A 09/12/2020   Procedure: CYSTOSCOPY WITH LITHOLAPAXY, LEFT RETROGRADE PYELOGRAMS, LEFT URETEROSCOPY, AND STENT PLACEMENT;  Surgeon: Ardis Hughs, MD;  Location: [facility];  Service: Urology;  Laterality: N/A;  REQUESTING 2 HRS   CYSTOSCOPY WITH RETROGRADE PYELOGRAM, URETEROSCOPY AND STENT PLACEMENT Left 01/06/2013   Procedure: CYSTOSCOPY WITH RETROGRADE PYELOGRAM, URETEROSCOPY AND STENT PLACEMENT;  Surgeon: Hanley Ben, MD;  Location: [place];  Service: Urology;  Laterality: Left;   CYSTOSCOPY WITH RETROGRADE PYELOGRAM, URETEROSCOPY AND STENT PLACEMENT Left 09/11/2014   Procedure: CYSTOSCOPY WITH LEFT RETROGRADE PYELOGRAM, URETEROSCOPY AND STENT PLACEMENT;  Surgeon: Altamese Dilling  Nesi, MD;  Location: [place];  Service: Urology;  Laterality: Left;   DEEP BRAIN STIMULATOR PLACEMENT  11/29/2012   genertor device at left pectoral area-   EXTRACORPOREAL SHOCK WAVE LITHOTRIPSY Left 10-11-2010  &  01-05-2011   HOLMIUM LASER APPLICATION Left 95/07/2128   Procedure: HOLMIUM LASER APPLICATION;  Surgeon: Hanley Ben, MD;  Location: [place];  Service: Urology;  Laterality: Left;   HOLMIUM LASER APPLICATION Left  09/08/5782   Procedure: HOLMIUM LASER APPLICATION;  Surgeon: Lowella Bandy, MD;  Location: [facility];  Service: Urology;  Laterality: Left;   HOLMIUM LASER APPLICATION N/A 07/11/6293   Procedure: HOLMIUM LASER APPLICATION;  Surgeon: Franchot Gallo, MD;  Location: [facility];  Service: Urology;  Laterality: N/A;   NEGATIVE SLEEP STUDY  2013  per pt   SP PERC NEPHROSTOMY Left 12-16-2012   STONE EXTRACTION WITH BASKET Left 01/06/2013   Procedure: STONE EXTRACTION WITH BASKET;  Surgeon: Hanley Ben, MD;  Location: [place];  Service: Urology;  Laterality: Left;   TRANSURETHRAL RESECTION OF PROSTATE N/A 09/12/2020   Procedure: TRANSURETHRAL RESECTION OF THE PROSTATE (TURP);  Surgeon: Ardis Hughs, MD;  Location: [facility];  Service: Urology;  Laterality: N/A;       Home Medications    Prior to Admission medications   Medication Sig Start Date End Date Taking? Authorizing Provider  benzonatate (TESSALON) 100 MG capsule Take 1 capsule (100 mg total) by mouth 3 (three) times daily as needed for cough. Do not take with alcohol or while driving or operating heavy machinery 10/27/21  Yes Eulogio Bear, NP  ALPRAZolam Duanne Moron) 0.5 MG tablet Take 0.5 mg by mouth 2 (two) times daily as needed for anxiety.    [provider]  carbidopa-levodopa (SINEMET) 25-100 MG per tablet Take 2 tablets by mouth 5 (five) times daily.    [provider]  escitalopram (LEXAPRO) 20 MG tablet Take 20 mg by mouth every morning.     [provider]  ferrous sulfate 325 (65 FE) MG tablet Take 1 tablet (325 mg total) by mouth daily. 09/16/20 11/15/20  Ardis Hughs, MD  fludrocortisone (FLORINEF) 0.1 MG tablet TAKE 2 TABLETS (0.2 MG TOTAL) BY MOUTH DAILY. Patient taking differently: Take 0.1 mg by mouth 2 (two) times daily. 11/24/19   Lorretta Harp, MD  Phenazopyridine HCl (AZO TABS PO) Take 1 tablet by mouth daily  as needed (pain).    [provider]  rasagiline (AZILECT) 1 MG TABS tablet Take 1 mg by mouth daily.    [provider]  sulfamethoxazole-trimethoprim (BACTRIM DS) 800-160 MG tablet Take 1 tablet by mouth every 12 (twelve) hours. 09/13/20   Ardis Hughs, MD  traMADol (ULTRAM) 50 MG tablet Take 1-2 tablets (50-100 mg total) by mouth every 6 (six) hours as needed for moderate pain. 09/13/20   Ardis Hughs, MD  triamcinolone cream (KENALOG) 0.1 % Apply 1 application topically daily as needed (eczema).    [provider]    Family History History reviewed. No pertinent family history.  Social History Social History   Tobacco Use   Smoking status: Never   Smokeless tobacco: Never  Vaping Use   Vaping Use: Never used  Substance Use Topics   Alcohol use: Yes    Comment: rarely beer   Drug use: No     Allergies   Rivastigmine tartrate, Tamsulosin, and Alfuzosin   Review of Systems Review  of Systems Per HPI  Physical Exam Triage Vital Signs ED Triage Vitals  Enc Vitals Group     BP 10/27/21 1252 104/64     Pulse Rate 10/27/21 1252 79     Resp 10/27/21 1252 20     Temp 10/27/21 1252 98.1 F (36.7 C)     Temp src --      SpO2 10/27/21 1252 95 %     Weight --      Height --      Head Circumference --      Peak Flow --      Pain Score 10/27/21 1250 0     Pain Loc --      Pain Edu? --      Excl. in [place]? --    No data found.  Updated Vital Signs BP 104/64   Pulse 79   Temp 98.1 F (36.7 C)   Resp 20   SpO2 95%   Visual Acuity Right Eye Distance:   Left Eye Distance:   Bilateral Distance:    Right Eye Near:   Left Eye Near:    Bilateral Near:     Physical Exam Vitals and nursing note reviewed.  Constitutional:      General: He is not in acute distress.    Appearance: Normal appearance. He is not ill-appearing or toxic-appearing.  HENT:     Head: Normocephalic and atraumatic.     Right Ear: Tympanic membrane, ear  canal and external ear normal.     Left Ear: Tympanic membrane, ear canal and external ear normal.     Nose: Congestion and rhinorrhea present.     Mouth/Throat:     Mouth: Mucous membranes are moist.     Pharynx: Oropharynx is clear. Posterior oropharyngeal erythema present. No oropharyngeal exudate.  Eyes:     General: No scleral icterus.    Extraocular Movements: Extraocular movements intact.  Cardiovascular:     Rate and Rhythm: Normal rate and regular rhythm.  Pulmonary:     Effort: Pulmonary effort is normal. No respiratory distress.     Breath sounds: Normal breath sounds. No wheezing, rhonchi or rales.  Musculoskeletal:     Cervical back: Normal range of motion and neck supple.  Lymphadenopathy:     Cervical: No cervical adenopathy.  Skin:    General: Skin is warm and dry.     Coloration: Skin is not jaundiced or pale.     Findings: No erythema or rash.  Neurological:     Mental Status: He is alert and oriented to person, place, and time.     Motor: No weakness.     Gait: Gait abnormal (patient sitting in wheelchair; uses this at baseline for ambulation).  Psychiatric:        Behavior: Behavior is cooperative.      UC Treatments / Results  Labs (all labs ordered are listed, but only abnormal results are displayed) Labs Reviewed  SARS CORONAVIRUS 2 (TAT 6-24 HRS)  RESP PANEL BY RT-PCR (FLU A&B, COVID) [id number]    EKG   Radiology No results found.  Procedures Procedures (including critical care time)  Medications Ordered in UC Medications - No data to display  Initial Impression / Assessment and Plan / UC Course  I have reviewed the triage vital signs and the nursing notes.  Pertinent labs & imaging results that were available during my care of the patient were reviewed by me and considered in my medical decision making (see chart for  details).    Patient is well-appearing, normotensive, afebrile, not tachycardic, not tachypneic, oxygenating well on room  air.  COVID-19 and influenza testing obtained.  Patient be a good candidate for Tamiflu or molnupiravir if he test positive for either of these viruses.  Supportive care discussed.  Start [employer] as needed for dry cough.  ER and return precautions discussed.  The patient was given the opportunity to ask questions.  All questions answered to their satisfaction.  The patient is in agreement to this plan.   Final Clinical Impressions(s) / UC Diagnoses   Final diagnoses:  Encounter for screening for COVID-19  Viral URI with cough     Discharge Instructions      Your symptoms and exam findings are most consistent with a viral upper respiratory infection. These usually run their course in about 10 days.  If your symptoms last longer than 10 days without improvement, please follow up with your primary care provider.  If your symptoms, worsen, please go to the Emergency Room.    We have tested you today for COVID-19 and influenza.  You will see the results in Mychart and we will call you with positive results.    If you are positive, we will either prescribe molnupiravir or Tamiflu for you to start.  Please stay home and isolate until you are aware of the results.    Some things that can make you feel better are: - Increased rest - Increasing fluid with water/sugar free electrolytes - Acetaminophen and ibuprofen as needed for fever/pain.  - Salt water gargling, chloraseptic spray and throat lozenges - OTC guaifenesin (Mucinex) 600 mg twice daily.  - Saline sinus flushes or a neti pot.  - Humidifying the air. - Tessalon to help suppress dry cough     ED Prescriptions     Medication Sig Dispense Auth. Provider   benzonatate (TESSALON) 100 MG capsule Take 1 capsule (100 mg total) by mouth 3 (three) times daily as needed for cough. Do not take with alcohol or while driving or operating heavy machinery 21 capsule Eulogio Bear, NP      PDMP not reviewed this encounter.    Eulogio Bear, NP 10/27/21 [phone]

## 2021-10-27 NOTE — Discharge Instructions (Addendum)
Your symptoms and exam findings are most consistent with a viral upper respiratory infection. These usually run their course in about 10 days.  If your symptoms last longer than 10 days without improvement, please follow up with your primary care provider.  If your symptoms, worsen, please go to the Emergency Room.    We have tested you today for COVID-19 and influenza.  You will see the results in Mychart and we will call you with positive results.    If you are positive, we will either prescribe molnupiravir or Tamiflu for you to start.  Please stay home and isolate until you are aware of the results.    Some things that can make you feel better are: - Increased rest - Increasing fluid with water/sugar free electrolytes - Acetaminophen and ibuprofen as needed for fever/pain.  - Salt water gargling, chloraseptic spray and throat lozenges - OTC guaifenesin (Mucinex) 600 mg twice daily.  - Saline sinus flushes or a neti pot.  - Humidifying the air. - Tessalon to help suppress dry cough

## 2021-10-27 NOTE — ED Triage Notes (Signed)
Pt presents with c/o cough and sore throat for past 2 days, positive covid exposure in the home

## 2021-10-28 ENCOUNTER — Telehealth (HOSPITAL_COMMUNITY): Payer: Self-pay

## 2021-10-28 LAB — SARS CORONAVIRUS 2 (TAT 6-24 HRS): SARS Coronavirus 2: POSITIVE — AB

## 2021-10-28 MED ORDER — MOLNUPIRAVIR EUA 200MG CAPSULE: 4.0000 | ORAL_CAPSULE | Freq: Two times a day (BID) | ORAL | 0 refills | Status: AC

## 2022-07-17 ENCOUNTER — Other Ambulatory Visit: Payer: Self-pay | Admitting: Urology

## 2022-08-05 NOTE — Patient Instructions (Addendum)
SURGICAL WAITING ROOM VISITATION  Patients having surgery or a procedure may have no more than 2 support people in the waiting area - these visitors may rotate.    Children under the age of 69 must have an adult with them who is not the patient.  Due to an increase in RSV and influenza rates and associated hospitalizations, children ages 70 and under may not visit patients in [facility] hospitals.  If the patient needs to stay at the hospital during part of their recovery, the visitor guidelines for inpatient rooms apply. Pre-op nurse will coordinate an appropriate time for 1 support person to accompany patient in pre-op.  This support person may not rotate.    Please refer to the [facility] website for the visitor guidelines for Inpatients (after your surgery is over and you are in a regular room).    Your procedure is scheduled on: 08/13/22   Report to [facility] Main Entrance    Report to admitting at 6:20 AM   Call this number if you have problems the morning of surgery [phone]   Do not eat food or drink liquids :After Midnight.         If you have questions, please contact your surgeon's office.   FOLLOW BOWEL PREP AND ANY ADDITIONAL PRE OP INSTRUCTIONS YOU RECEIVED FROM YOUR SURGEON'S OFFICE!!!     Oral Hygiene is also important to reduce your risk of infection.                                    Remember - BRUSH YOUR TEETH THE MORNING OF SURGERY WITH YOUR REGULAR TOOTHPASTE  DENTURES WILL BE REMOVED PRIOR TO SURGERY PLEASE DO NOT APPLY "Poly grip" OR ADHESIVES!!!   Take these medicines the morning of surgery with A SIP OF WATER: Alprazolam, Carbidopa-Levodopa                              You may not have any metal on your body including hair pins, jewelry, and body piercing             Do not wear lotions, powders, cologne, or deodorant              Men may shave face and neck.   Do not bring valuables to the hospital. [facility] IS NOT              RESPONSIBLE   FOR VALUABLES.   Contacts, glasses, dentures or bridgework may not be worn into surgery.   Bring small overnight bag day of surgery.   DO NOT BRING YOUR HOME MEDICATIONS TO THE HOSPITAL. PHARMACY WILL DISPENSE MEDICATIONS LISTED ON YOUR MEDICATION LIST TO YOU DURING YOUR ADMISSION IN THE HOSPITAL!    Patients discharged on the day of surgery will not be allowed to drive home.  Someone NEEDS to stay with you for the first 24 hours after anesthesia.              Please read over the following fact sheets you were given: IF YOU HAVE QUESTIONS ABOUT YOUR PRE-OP INSTRUCTIONS PLEASE CALL [phone]   If you received a COVID test during your pre-op visit  it is requested that you wear a mask when out in public, stay away from anyone that may not be feeling well and notify your surgeon if you develop  symptoms. If you test positive for Covid or have been in contact with anyone that has tested positive in the last 10 days please notify you surgeon.    [facility] - Preparing for Surgery Before surgery, you can play an important role.  Because skin is not sterile, your skin needs to be as free of germs as possible.  You can reduce the number of germs on your skin by washing with CHG (chlorahexidine gluconate) soap before surgery.  CHG is an antiseptic cleaner which kills germs and bonds with the skin to continue killing germs even after washing. Please DO NOT use if you have an allergy to CHG or antibacterial soaps.  If your skin becomes reddened/irritated stop using the CHG and inform your nurse when you arrive at Short Stay. Do not shave (including legs and underarms) for at least 48 hours prior to the first CHG shower.  You may shave your face/neck.  Please follow these instructions carefully:  1.  Shower with CHG Soap the night before surgery and the  morning of surgery.  2.  If you choose to wash your hair, wash your hair first as usual with your normal  shampoo.  3.  After  you shampoo, rinse your hair and body thoroughly to remove the shampoo.                             4.  Use CHG as you would any other liquid soap.  You can apply chg directly to the skin and wash.  Gently with a scrungie or clean washcloth.  5.  Apply the CHG Soap to your body ONLY FROM THE NECK DOWN.   Do   not use on face/ open                           Wound or open sores. Avoid contact with eyes, ears mouth and   genitals (private parts).                       Wash face,  Genitals (private parts) with your normal soap.             6.  Wash thoroughly, paying special attention to the area where your    surgery  will be performed.  7.  Thoroughly rinse your body with warm water from the neck down.  8.  DO NOT shower/wash with your normal soap after using and rinsing off the CHG Soap.                9.  Pat yourself dry with a clean towel.            10.  Wear clean pajamas.            11.  Place clean sheets on your bed the night of your first shower and do not  sleep with pets. Day of Surgery : Do not apply any lotions/deodorants the morning of surgery.  Please wear clean clothes to the hospital/surgery center.  FAILURE TO FOLLOW THESE INSTRUCTIONS MAY RESULT IN THE CANCELLATION OF YOUR SURGERY  PATIENT SIGNATURE_________________________________  NURSE SIGNATURE__________________________________  ________________________________________________________________________

## 2022-08-05 NOTE — Progress Notes (Addendum)
COVID Vaccine Completed:yes  Date of COVID positive in last 90 days: no  PC[facility] - [facility] Cardiologist - Nanetta Batty, MD LOV 03/13/20 for hypotension   Chest x-ray - n/a EKG - with [facility] requsted Stress Test - 08/13/20 Epic ECHO - n/a Cardiac Cath - n/a Pacemaker/ICD device last checked: n/a Spinal Cord Stimulator: n/a  Bowel Prep - no  Sleep Study - n/a CPAP -   Fasting Blood Sugar - n/a Checks Blood Sugar _____ times a day  Last dose of GLP1 agonist-  N/A GLP1 instructions:  N/A   Last dose of SGLT-2 inhibitors-  N/A SGLT-2 instructions: N/A   Blood Thinner Instructions:  n/a Aspirin Instructions: Last Dose:  Activity level: Can perform activities of daily living without stopping and without symptoms of chest pain or shortness of breath. Ambulates with walker or cane.  Anesthesia review: hypotension, syncope, UA positive   Patient denies shortness of breath, fever, cough and chest pain at PAT appointment  Patient verbalized understanding of instructions that were given to them at the PAT appointment. Patient was also instructed that they will need to review over the PAT instructions again at home before surgery.

## 2022-08-07 ENCOUNTER — Other Ambulatory Visit: Payer: Self-pay

## 2022-08-07 ENCOUNTER — Encounter (HOSPITAL_COMMUNITY): Payer: Self-pay

## 2022-08-07 ENCOUNTER — Encounter (HOSPITAL_COMMUNITY): Admission: RE | Admit: 2022-08-07 | Payer: Medicare Other | Source: Ambulatory Visit

## 2022-08-07 DIAGNOSIS — Z1389 Encounter for screening for other disorder: Secondary | ICD-10-CM | POA: Insufficient documentation

## 2022-08-07 DIAGNOSIS — Z01818 Encounter for other preprocedural examination: Secondary | ICD-10-CM | POA: Diagnosis present

## 2022-08-07 HISTORY — DX: Contact with and (suspected) exposure to other hazardous, chiefly nonmedicinal, chemicals: Z77.098

## 2022-08-07 HISTORY — DX: Contact with and (suspected) exposure to other war theater: Z77.39

## 2022-08-07 LAB — BASIC METABOLIC PANEL
Anion gap: 7 (ref 5–15)
BUN: 18 mg/dL (ref 8–23)
CO2: 27 mmol/L (ref 22–32)
Calcium: 9.2 mg/dL (ref 8.9–10.3)
Chloride: 103 mmol/L (ref 98–111)
Creatinine, Ser: 0.99 mg/dL (ref 0.61–1.24)
GFR, Estimated: >60 mL/min (ref >60)
Glucose, Bld: 116 mg/dL — ABNORMAL HIGH (ref 70–99)
Potassium: 3.5 mmol/L (ref 3.5–5.1)
Sodium: 137 mmol/L (ref 135–145)

## 2022-08-07 LAB — CBC
HCT: 37.4 % — ABNORMAL LOW (ref 39.0–52.0)
Hemoglobin: 12.1 g/dL — ABNORMAL LOW (ref 13.0–17.0)
MCH: 30.9 pg (ref 26.0–34.0)
MCHC: 32.4 g/dL (ref 30.0–36.0)
MCV: 95.7 fL (ref 80.0–100.0)
Platelets: 283 10*3/uL (ref 150–400)
RBC: 3.91 MIL/uL — ABNORMAL LOW (ref 4.22–5.81)
RDW: 12.1 % (ref 11.5–15.5)
WBC: 5.5 10*3/uL (ref 4.0–10.5)
nRBC: 0 % (ref 0.0–0.2)

## 2022-08-08 LAB — URINE CULTURE: Culture: <10000 — AB

## 2022-08-11 NOTE — Progress Notes (Signed)
Anesthesia Chart Review   Case: [id number] Date/Time: 08/13/22 0820   Procedure: RIGHT NEPHROLITHOTOMY PERCUTANEOUS (Right) - 210 MINUTES   Anesthesia type: General   Pre-op diagnosis: RIGHT RENAL CALCULUS   Location: [facility] ROOM 10 / [facility]   Surgeons: Crist Fat, MD       DISCUSSION:77 y.o. never smoker with h/o Parkinson's disease, s/p deep brain stimulator, BPH, right renal calculus scheduled for above procedure 08/13/2022 with Dr. Berniece Salines.   Pt last seen by cardiology 07/01/2022.  Follows for autonomic insufficiency causing orthostatic hypotension. Per OV note stable at this visit with 1 year follow up recommended.   VS: BP 103/67   Pulse 72   Temp 36.7 C (Oral)   Resp 18   Ht 5\' 10"  (1.778 m)   SpO2 95%   BMI 21.16 kg/m   PROVIDERS: Bedelia Person, MD is PCP   Richardson Chiquito MD is Cardiologist  LABS: Labs reviewed: Acceptable for surgery. (all labs ordered are listed, but only abnormal results are displayed)  Labs Reviewed  URINE CULTURE - Abnormal; Notable for the following components:      Result Value   Culture   (*)    Value: <10,000 COLONIES/mL INSIGNIFICANT GROWTH Performed at [facility], [address], [place], [place] [address]    All other components within normal limits  CBC - Abnormal; Notable for the following components:   RBC 3.91 (*)    Hemoglobin 12.1 (*)    HCT 37.4 (*)    All other components within normal limits  BASIC METABOLIC PANEL - Abnormal; Notable for the following components:   Glucose, Bld 116 (*)    All other components within normal limits     IMAGES:   EKG:   CV: ECHO 03/06/21 ================= There is mild concentric left ventricular hypertrophy. Left ventricular systolic function is normal. Ejection Fraction = 65-70% (Visual Estimation). EF = 66% by 2D biplane method. Left Ventricular Filling pattern is normal for age. The E/e'ratio is 11.0. The right ventricle is normal in size and  function. No hemodynamically significant valvular aortic stenosis. There is mild mitral regurgitation. There is mild tricuspid regurgitation. Right ventricular systolic pressure is normal. There is no comparison study available.   Myocardial Perfusion 08/13/2020 The left ventricular ejection fraction is hyperdynamic (>65%). Nuclear stress EF: 67%. There was no ST segment deviation noted during stress. The study is normal. This is a low risk study. Past Medical History:  Diagnosis Date   Agent orange exposure    BPH (benign prostatic hypertrophy)    Cancer (HCC)    basal cell temple   Constipation    Depression    Fatigue    d/t Parkinson's   GERD (gastroesophageal reflux disease)    History of kidney stones    Left ureteral calculus    Lower urinary tract symptoms (LUTS)    Nephrolithiasis    Neuromuscular disorder (HCC)     parkinson's   Orthostatic hypotension    d/t Parkinson's disease   Orthostatic hypotension    Parkinson's disease    Peyronie disease    Pneumonia 2018   Renal cyst, left    S/P deep brain stimulator placement    11-29-2012   Sleep behavior disorder, REM    Wears glasses    Wears hearing aid    BILATERAL-- INTERMITTANT WEARS    Past Surgical History:  Procedure Laterality Date   CHOLECYSTECTOMY  12/21/2014   Procedure: LAPAROSCOPIC CHOLECYSTECTOMY;  Surgeon: Jed Limerick  Derrell Lolling, MD;  Location: [facility] OR;  Service: General;;   COLONOSCOPY     CYSTO/  LEFT URETEROSOCPY/   LEFT URETERAL STENT PLACEMENT/  LASER BLADDER STONES AND EXTRACTION  09/25/2010   CYSTOSCOPY WITH INSERTION OF UROLIFT N/A 03/11/2017   Procedure: CYSTOSCOPY WITH INSERTION OF UROLIFT, LITHOPAXY,STONE OBTAINED;  Surgeon: Marcine Matar, MD;  Location: [facility];  Service: Urology;  Laterality: N/A;   CYSTOSCOPY WITH LITHOLAPAXY N/A 09/12/2020   Procedure: CYSTOSCOPY WITH LITHOLAPAXY, LEFT RETROGRADE PYELOGRAMS, LEFT URETEROSCOPY, AND STENT PLACEMENT;  Surgeon:  Crist Fat, MD;  Location: [facility];  Service: Urology;  Laterality: N/A;  REQUESTING 2 HRS   CYSTOSCOPY WITH RETROGRADE PYELOGRAM, URETEROSCOPY AND STENT PLACEMENT Left 01/06/2013   Procedure: CYSTOSCOPY WITH RETROGRADE PYELOGRAM, URETEROSCOPY AND STENT PLACEMENT;  Surgeon: Lindaann Slough, MD;  Location: [facility];  Service: Urology;  Laterality: Left;   CYSTOSCOPY WITH RETROGRADE PYELOGRAM, URETEROSCOPY AND STENT PLACEMENT Left 09/11/2014   Procedure: CYSTOSCOPY WITH LEFT RETROGRADE PYELOGRAM, URETEROSCOPY AND STENT PLACEMENT;  Surgeon: Su Grand, MD;  Location: [facility];  Service: Urology;  Laterality: Left;   DEEP BRAIN STIMULATOR PLACEMENT  11/29/2012   genertor device at left pectoral area-   EXTRACORPOREAL SHOCK WAVE LITHOTRIPSY Left 10-11-2010  &  01-05-2011   HOLMIUM LASER APPLICATION Left 01/06/2013   Procedure: HOLMIUM LASER APPLICATION;  Surgeon: Lindaann Slough, MD;  Location: [facility];  Service: Urology;  Laterality: Left;   HOLMIUM LASER APPLICATION Left 09/11/2014   Procedure: HOLMIUM LASER APPLICATION;  Surgeon: Su Grand, MD;  Location: [facility];  Service: Urology;  Laterality: Left;   HOLMIUM LASER APPLICATION N/A 03/11/2017   Procedure: HOLMIUM LASER APPLICATION;  Surgeon: Marcine Matar, MD;  Location: [facility]  Service: Urology;  Laterality: N/A;   NEGATIVE SLEEP STUDY  2013  per pt   SP PERC NEPHROSTOMY Left 12/16/2012   STONE EXTRACTION WITH BASKET Left 01/06/2013   Procedure: STONE EXTRACTION WITH BASKET;  Surgeon: Lindaann Slough, MD;  Location: [facility];  Service: Urology;  Laterality: Left;   TONSILLECTOMY     TRANSURETHRAL RESECTION OF PROSTATE N/A 09/12/2020   Procedure: TRANSURETHRAL RESECTION OF THE PROSTATE (TURP);  Surgeon: Crist Fat, MD;  Location: [facility];  Service: Urology;  Laterality: N/A;     MEDICATIONS:  ALPRAZolam (XANAX) 0.25 MG tablet   benzonatate (TESSALON) 100 MG capsule   calcium carbonate (TUMS - DOSED IN MG ELEMENTAL CALCIUM) 500 MG chewable tablet   carbidopa-levodopa (SINEMET CR) 50-200 MG tablet   carbidopa-levodopa (SINEMET) 25-100 MG per tablet   ferrous sulfate 325 (65 FE) MG tablet   fludrocortisone (FLORINEF) 0.1 MG tablet   sulfamethoxazole-trimethoprim (BACTRIM DS) 800-160 MG tablet   traMADol (ULTRAM) 50 MG tablet   triamcinolone cream (KENALOG) 0.1 %   No current facility-administered medications for this encounter.     Jodell Cipro Ward, PA-C [facility] [phone]

## 2022-08-12 NOTE — Anesthesia Preprocedure Evaluation (Signed)
Anesthesia Evaluation  Patient identified by MRN, date of birth, ID band Patient awake    Reviewed: Allergy & Precautions, NPO status , Patient's Chart, lab work & pertinent test results  History of Anesthesia Complications Negative for: history of anesthetic complications  Airway Mallampati: II  TM Distance: >3 FB Neck ROM: Full    Dental  (+) Caps, Dental Advisory Given   Pulmonary neg pulmonary ROS   breath sounds clear to auscultation       Cardiovascular (-) angina  Rhythm:Regular Rate:Normal  '22 Myoview:  The left ventricular ejection fraction is hyperdynamic (>65%).  Nuclear stress EF: 67%.  There was no ST segment deviation noted during stress.  The study is normal.    Neuro/Psych    Depression    Parkinson's: deep brain stim    GI/Hepatic Neg liver ROS,GERD  Controlled,,  Endo/Other  negative endocrine ROS    Renal/GU H/o stones     Musculoskeletal   Abdominal   Peds  Hematology negative hematology ROS (+)   Anesthesia Other Findings   Reproductive/Obstetrics                             Anesthesia Physical Anesthesia Plan  ASA: 3  Anesthesia Plan: General   Post-op Pain Management: Tylenol PO (pre-op)*   Induction: Intravenous  PONV Risk Score and Plan: 2 and Ondansetron and Dexamethasone  Airway Management Planned: Oral ETT  Additional Equipment: None  Intra-op Plan:   Post-operative Plan: Extubation in OR  Informed Consent: I have reviewed the patients History and Physical, chart, labs and discussed the procedure including the risks, benefits and alternatives for the proposed anesthesia with the patient or authorized representative who has indicated his/her understanding and acceptance.     Dental advisory given  Plan Discussed with: CRNA and Surgeon  Anesthesia Plan Comments:         Anesthesia Quick Evaluation

## 2022-08-13 ENCOUNTER — Other Ambulatory Visit: Payer: Self-pay

## 2022-08-13 ENCOUNTER — Encounter (HOSPITAL_COMMUNITY): Payer: Self-pay | Admitting: Urology

## 2022-08-13 ENCOUNTER — Ambulatory Visit (HOSPITAL_BASED_OUTPATIENT_CLINIC_OR_DEPARTMENT_OTHER): Payer: Medicare Other | Admitting: Anesthesiology

## 2022-08-13 ENCOUNTER — Ambulatory Visit (HOSPITAL_COMMUNITY): Payer: Medicare Other | Admitting: Physician Assistant

## 2022-08-13 ENCOUNTER — Ambulatory Visit (HOSPITAL_COMMUNITY): Payer: Medicare Other

## 2022-08-13 ENCOUNTER — Observation Stay (HOSPITAL_COMMUNITY)
Admission: RE | Admit: 2022-08-13 | Discharge: 2022-08-14 | Disposition: A | Discharge status: Home / Self Care | Payer: Medicare Other | Source: Ambulatory Visit | Attending: Urology | Admitting: Urology

## 2022-08-13 ENCOUNTER — Encounter (HOSPITAL_COMMUNITY)
Admission: RE | Disposition: A | Discharge status: Home / Self Care | Payer: Self-pay | Source: Ambulatory Visit | Attending: Urology

## 2022-08-13 DIAGNOSIS — R310 Gross hematuria: Secondary | ICD-10-CM | POA: Diagnosis not present

## 2022-08-13 DIAGNOSIS — G20C Parkinsonism, unspecified: Secondary | ICD-10-CM | POA: Diagnosis not present

## 2022-08-13 DIAGNOSIS — G20A1 Parkinson's disease without dyskinesia, without mention of fluctuations: Secondary | ICD-10-CM | POA: Diagnosis not present

## 2022-08-13 DIAGNOSIS — Z79899 Other long term (current) drug therapy: Secondary | ICD-10-CM | POA: Insufficient documentation

## 2022-08-13 DIAGNOSIS — A419 Sepsis, unspecified organism: Secondary | ICD-10-CM

## 2022-08-13 DIAGNOSIS — N200 Calculus of kidney: Secondary | ICD-10-CM

## 2022-08-13 DIAGNOSIS — R300 Dysuria: Secondary | ICD-10-CM | POA: Insufficient documentation

## 2022-08-13 DIAGNOSIS — N210 Calculus in bladder: Secondary | ICD-10-CM | POA: Diagnosis not present

## 2022-08-13 HISTORY — PX: NEPHROLITHOTOMY: SHX5134

## 2022-08-13 SURGERY — NEPHROLITHOTOMY PERCUTANEOUS
Anesthesia: General | Site: Flank | Laterality: Right

## 2022-08-13 MED ORDER — ONDANSETRON HCL 4 MG/2ML IJ SOLN: 4.0000 mg | Freq: Four times a day (QID) | INTRAMUSCULAR | Status: DC | PRN

## 2022-08-13 MED ORDER — CEFAZOLIN SODIUM-DEXTROSE 2-4 GM/100ML-% IV SOLN
2.0000 g | INTRAVENOUS | Status: AC
  Administered 2022-08-13: 2 g via INTRAVENOUS
  Filled 2022-08-13: qty 100

## 2022-08-13 MED ORDER — ALPRAZOLAM 0.5 MG PO TABS: 0.5000 mg | ORAL_TABLET | Freq: Two times a day (BID) | ORAL | Status: DC | PRN

## 2022-08-13 MED ORDER — CIPROFLOXACIN IN D5W 400 MG/200ML IV SOLN
400.0000 mg | INTRAVENOUS | Status: AC
  Administered 2022-08-13: 400 mg via INTRAVENOUS
  Filled 2022-08-13: qty 200

## 2022-08-13 MED ORDER — FLUDROCORTISONE ACETATE 0.1 MG PO TABS
0.1000 mg | ORAL_TABLET | Freq: Two times a day (BID) | ORAL | Status: DC
  Administered 2022-08-13 – 2022-08-14 (×2): 0.1 mg via ORAL
  Filled 2022-08-13 (×2): qty 1

## 2022-08-13 MED ORDER — ONDANSETRON HCL 4 MG/2ML IJ SOLN
INTRAMUSCULAR | Status: DC | PRN
  Administered 2022-08-13: 4 mg via INTRAVENOUS

## 2022-08-13 MED ORDER — DEXAMETHASONE SODIUM PHOSPHATE 10 MG/ML IJ SOLN
INTRAMUSCULAR | Status: AC
  Filled 2022-08-13: qty 1

## 2022-08-13 MED ORDER — HYDROMORPHONE HCL 1 MG/ML IJ SOLN: 0.5000 mg | INTRAMUSCULAR | Status: DC | PRN

## 2022-08-13 MED ORDER — EPHEDRINE SULFATE (PRESSORS) 50 MG/ML IJ SOLN
INTRAMUSCULAR | Status: DC | PRN
  Administered 2022-08-13: 10 mg via INTRAVENOUS

## 2022-08-13 MED ORDER — PROPOFOL 10 MG/ML IV BOLUS
INTRAVENOUS | Status: AC
  Filled 2022-08-13: qty 20

## 2022-08-13 MED ORDER — FENTANYL CITRATE (PF) 100 MCG/2ML IJ SOLN
INTRAMUSCULAR | Status: DC | PRN
  Administered 2022-08-13: 100 ug via INTRAVENOUS

## 2022-08-13 MED ORDER — ROCURONIUM BROMIDE 100 MG/10ML IV SOLN
INTRAVENOUS | Status: DC | PRN
  Administered 2022-08-13: 10 mg via INTRAVENOUS
  Administered 2022-08-13: 50 mg via INTRAVENOUS

## 2022-08-13 MED ORDER — SULFAMETHOXAZOLE-TRIMETHOPRIM 800-160 MG PO TABS
1.0000 | ORAL_TABLET | Freq: Two times a day (BID) | ORAL | Status: DC
  Administered 2022-08-13 – 2022-08-14 (×2): 1 via ORAL
  Filled 2022-08-13 (×2): qty 1

## 2022-08-13 MED ORDER — SUGAMMADEX SODIUM 200 MG/2ML IV SOLN
INTRAVENOUS | Status: DC | PRN
  Administered 2022-08-13: 300 mg via INTRAVENOUS

## 2022-08-13 MED ORDER — PROPOFOL 10 MG/ML IV BOLUS
INTRAVENOUS | Status: DC | PRN
  Administered 2022-08-13: 150 mg via INTRAVENOUS

## 2022-08-13 MED ORDER — SODIUM CHLORIDE 0.45 % IV SOLN: INTRAVENOUS | Status: DC

## 2022-08-13 MED ORDER — LACTATED RINGERS IV SOLN: INTRAVENOUS | Status: DC | PRN

## 2022-08-13 MED ORDER — CHLORHEXIDINE GLUCONATE 0.12 % MT SOLN
15.0000 mL | Freq: Once | OROMUCOSAL | Status: AC
  Administered 2022-08-13: 15 mL via OROMUCOSAL

## 2022-08-13 MED ORDER — ONDANSETRON HCL 4 MG/2ML IJ SOLN
INTRAMUSCULAR | Status: AC
  Filled 2022-08-13: qty 2

## 2022-08-13 MED ORDER — IOHEXOL 300 MG/ML  SOLN
INTRAMUSCULAR | Status: DC | PRN
  Administered 2022-08-13: 35 mL

## 2022-08-13 MED ORDER — TRAMADOL HCL 50 MG PO TABS: 50.0000 mg | ORAL_TABLET | Freq: Four times a day (QID) | ORAL | 0 refills | Status: DC | PRN

## 2022-08-13 MED ORDER — ORAL CARE MOUTH RINSE: 15.0000 mL | Freq: Once | OROMUCOSAL | Status: AC

## 2022-08-13 MED ORDER — CARBIDOPA-LEVODOPA 25-100 MG PO TABS
2.5000 | ORAL_TABLET | ORAL | Status: DC
  Administered 2022-08-13 – 2022-08-14 (×3): 2.5 via ORAL
  Filled 2022-08-13 (×3): qty 3

## 2022-08-13 MED ORDER — CALCIUM CARBONATE ANTACID 500 MG PO CHEW: 1.0000 | CHEWABLE_TABLET | Freq: Every day | ORAL | Status: DC | PRN

## 2022-08-13 MED ORDER — FENTANYL CITRATE (PF) 100 MCG/2ML IJ SOLN
INTRAMUSCULAR | Status: AC
  Filled 2022-08-13: qty 2

## 2022-08-13 MED ORDER — LIDOCAINE HCL (CARDIAC) PF 100 MG/5ML IV SOSY
PREFILLED_SYRINGE | INTRAVENOUS | Status: DC | PRN
  Administered 2022-08-13: 80 mg via INTRAVENOUS

## 2022-08-13 MED ORDER — ACETAMINOPHEN 10 MG/ML IV SOLN
1000.0000 mg | Freq: Four times a day (QID) | INTRAVENOUS | Status: DC
  Administered 2022-08-13 – 2022-08-14 (×3): 1000 mg via INTRAVENOUS
  Filled 2022-08-13 (×3): qty 100

## 2022-08-13 MED ORDER — BENZONATATE 100 MG PO CAPS: 100.0000 mg | ORAL_CAPSULE | Freq: Three times a day (TID) | ORAL | Status: DC | PRN

## 2022-08-13 MED ORDER — DEXAMETHASONE SODIUM PHOSPHATE 4 MG/ML IJ SOLN
INTRAMUSCULAR | Status: DC | PRN
  Administered 2022-08-13: 4 mg via INTRAVENOUS

## 2022-08-13 MED ORDER — LACTATED RINGERS IV SOLN: INTRAVENOUS | Status: DC

## 2022-08-13 MED ORDER — 0.9 % SODIUM CHLORIDE (POUR BTL) OPTIME
TOPICAL | Status: DC | PRN
  Administered 2022-08-13 (×2): 1000 mL

## 2022-08-13 MED ORDER — SODIUM CHLORIDE 0.9 % IR SOLN
Status: DC | PRN
  Administered 2022-08-13 (×3): 3000 mL
  Administered 2022-08-13 (×2): 6000 mL

## 2022-08-13 MED ORDER — BUPIVACAINE-EPINEPHRINE 0.25% -1:200000 IJ SOLN
INTRAMUSCULAR | Status: AC
  Filled 2022-08-13: qty 1

## 2022-08-13 MED ORDER — CARBIDOPA-LEVODOPA ER 50-200 MG PO TBCR
1.0000 | EXTENDED_RELEASE_TABLET | Freq: Every day | ORAL | Status: DC
  Administered 2022-08-13: 1 via ORAL
  Filled 2022-08-13: qty 3

## 2022-08-13 MED ORDER — ACETAMINOPHEN 500 MG PO TABS
1000.0000 mg | ORAL_TABLET | Freq: Once | ORAL | Status: AC
  Administered 2022-08-13: 1000 mg via ORAL
  Filled 2022-08-13: qty 2

## 2022-08-13 MED ORDER — HYDRALAZINE HCL 20 MG/ML IJ SOLN: 5.0000 mg | INTRAMUSCULAR | Status: DC | PRN

## 2022-08-13 SURGICAL SUPPLY — 65 items
APL PRP STRL LF DISP 70% ISPRP (MISCELLANEOUS) ×1
APL SKNCLS STERI-STRIP NONHPOA (GAUZE/BANDAGES/DRESSINGS) ×1
BAG COUNTER SPONGE SURGICOUNT (BAG) IMPLANT
BAG DRN RND TRDRP ANRFLXCHMBR (UROLOGICAL SUPPLIES)
BAG SPNG CNTER NS LX DISP (BAG)
BAG URINE DRAIN 2000ML AR STRL (UROLOGICAL SUPPLIES) IMPLANT
BASKET STONE NCOMPASS (UROLOGICAL SUPPLIES) IMPLANT
BASKET ZERO TIP NITINOL 2.4FR (BASKET) IMPLANT
BENZOIN TINCTURE PRP APPL 2/3 (GAUZE/BANDAGES/DRESSINGS) ×1 IMPLANT
BLADE SURG 15 STRL LF DISP TIS (BLADE) ×1 IMPLANT
BLADE SURG 15 STRL SS (BLADE) ×1
BOOTIES KNEE HIGH SLOAN (MISCELLANEOUS) IMPLANT
BSKT STON RTRVL ZERO TP 2.4FR (BASKET) ×1
CATH 2WAY 30CC 24FR (CATHETERS) IMPLANT
CATH URETERAL DUAL LUMEN 10F (MISCELLANEOUS) ×1 IMPLANT
CATH URETL OPEN 5X70 (CATHETERS) ×1 IMPLANT
CATH UROLOGY TORQUE 65 (CATHETERS) IMPLANT
CATH X-FORCE N30 NEPHROSTOMY (TUBING) ×1 IMPLANT
CHLORAPREP W/TINT 26 (MISCELLANEOUS) ×1 IMPLANT
DRAPE C-ARM 42X120 X-RAY (DRAPES) ×1 IMPLANT
DRAPE LINGEMAN PERC (DRAPES) ×1 IMPLANT
DRAPE SURG IRRIG POUCH 19X23 (DRAPES) ×2 IMPLANT
DRSG TEGADERM 8X12 (GAUZE/BANDAGES/DRESSINGS) ×2 IMPLANT
EXTRACTOR STONE 1.7FRX115CM (UROLOGICAL SUPPLIES) IMPLANT
GAUZE PAD ABD 8X10 STRL (GAUZE/BANDAGES/DRESSINGS) IMPLANT
GAUZE SPONGE 4X4 12PLY STRL (GAUZE/BANDAGES/DRESSINGS) IMPLANT
GLOVE SURG LX STRL 7.5 STRW (GLOVE) ×1 IMPLANT
GOWN STRL REUS W/ TWL XL LVL3 (GOWN DISPOSABLE) ×1 IMPLANT
GOWN STRL REUS W/TWL XL LVL3 (GOWN DISPOSABLE) ×1
GUIDEWIRE AMPLAZ .035X145 (WIRE) ×2 IMPLANT
GUIDEWIRE ANG ZIPWIRE 038X150 (WIRE) IMPLANT
GUIDEWIRE STR DUAL SENSOR (WIRE) ×1 IMPLANT
HLDR NDL AMPLATZ W/INSERTS (MISCELLANEOUS) IMPLANT
HOLDER NEEDLE AMPLATZ W/INSERT (MISCELLANEOUS) IMPLANT
IV SET EXTENSION CATH 6 NF (IV SETS) ×1 IMPLANT
KIT BASIN OR (CUSTOM PROCEDURE TRAY) ×1 IMPLANT
KIT PROBE 340X3.4XDISP GRN (MISCELLANEOUS) IMPLANT
KIT PROBE TRILOGY 3.4X340 (MISCELLANEOUS)
KIT PROBE TRILOGY 3.9X350 (MISCELLANEOUS) IMPLANT
KIT TURNOVER KIT A (KITS) IMPLANT
LASER FIB FLEXIVA PULSE ID 365 (Laser) IMPLANT
MANIFOLD NEPTUNE II (INSTRUMENTS) ×1 IMPLANT
MAT ABSORB FLUID 56X50 GRAY (MISCELLANEOUS) ×1 IMPLANT
NDL SPNL 20GX3.5 QUINCKE YW (NEEDLE) IMPLANT
NDL TROCAR 18X15 ECHO (NEEDLE) IMPLANT
NDL TROCAR 18X20 (NEEDLE) IMPLANT
NEEDLE SPNL 20GX3.5 QUINCKE YW (NEEDLE) IMPLANT
NEEDLE TROCAR 18X15 ECHO (NEEDLE) ×1 IMPLANT
NEEDLE TROCAR 18X20 (NEEDLE) IMPLANT
NS IRRIG 1000ML POUR BTL (IV SOLUTION) ×1 IMPLANT
PACK CYSTO (CUSTOM PROCEDURE TRAY) ×1 IMPLANT
SHEATH PEELAWAY SET 9 (SHEATH) IMPLANT
SPONGE T-LAP 4X18 ~~LOC~~+RFID (SPONGE) ×1 IMPLANT
STENT URET 6FRX26 CONTOUR (STENTS) IMPLANT
SUT ETHILON 3 0 PS 1 (SUTURE) IMPLANT
SYR 10ML LL (SYRINGE) ×1 IMPLANT
SYR 20ML LL LF (SYRINGE) ×2 IMPLANT
SYR 50ML LL SCALE MARK (SYRINGE) ×1 IMPLANT
TOWEL OR 17X26 10 PK STRL BLUE (TOWEL DISPOSABLE) ×1 IMPLANT
TRACTIP FLEXIVA PULS ID 200XHI (Laser) IMPLANT
TRACTIP FLEXIVA PULSE ID 200 (Laser)
TRAY FOLEY MTR SLVR 16FR STAT (SET/KITS/TRAYS/PACK) ×1 IMPLANT
TUBING CONNECTING 10 (TUBING) ×2 IMPLANT
TUBING STONE CATCHER TRILOGY (MISCELLANEOUS) IMPLANT
TUBING UROLOGY SET (TUBING) ×1 IMPLANT

## 2022-08-13 NOTE — Interval H&P Note (Signed)
History and Physical Interval Note:  08/13/2022 7:36 AM  [name] [name]  has presented today for surgery, with the diagnosis of RIGHT RENAL CALCULUS.  The various methods of treatment have been discussed with the patient and family. After consideration of risks, benefits and other options for treatment, the patient has consented to  Procedure(s) with comments: RIGHT NEPHROLITHOTOMY PERCUTANEOUS (Right) - 210 MINUTES as a surgical intervention.  The patient's history has been reviewed, patient examined, no change in status, stable for surgery.  I have reviewed the patient's chart and labs.  Questions were answered to the patient's satisfaction.     Crist Fat

## 2022-08-13 NOTE — Anesthesia Postprocedure Evaluation (Signed)
Anesthesia Post Note  Patient: [name] [name]  Procedure(s) Performed: RIGHT NEPHROLITHOTOMY PERCUTANEOUS (Right: Flank)     Patient location during evaluation: PACU Anesthesia Type: General Level of consciousness: awake and alert, patient cooperative and oriented Pain management: pain level controlled Vital Signs Assessment: post-procedure vital signs reviewed and stable Respiratory status: spontaneous breathing, nonlabored ventilation and respiratory function stable Cardiovascular status: blood pressure returned to baseline and stable Postop Assessment: no apparent nausea or vomiting Anesthetic complications: no   No notable events documented.  Last Vitals:  Vitals:   08/13/22 1230 08/13/22 1330  BP: 139/66 (!) 144/73  Pulse: 77 77  Resp: 14 16  Temp:    SpO2: 97% 96%    Last Pain:  Vitals:   08/13/22 1330  TempSrc:   PainSc: 0-No pain                 Abiha Lukehart,E. Jalene Demo

## 2022-08-13 NOTE — Transfer of Care (Signed)
Immediate Anesthesia Transfer of Care Note  Patient: [name] [name]  Procedure(s) Performed: RIGHT NEPHROLITHOTOMY PERCUTANEOUS (Right: Flank)  Patient Location: PACU  Anesthesia Type:General  Level of Consciousness: alert   Airway & Oxygen Therapy: Patient Spontanous Breathing and Patient connected to face mask oxygen  Post-op Assessment: Report given to RN and Post -op Vital signs reviewed and stable  Post vital signs: Reviewed and stable  Last Vitals:  Vitals Value Taken Time  BP 155/72 08/13/22 1130  Temp    Pulse 83 08/13/22 1130  Resp 14 08/13/22 1130  SpO2 99 % 08/13/22 1130  Vitals shown include unfiled device data.  Last Pain:  Vitals:   08/13/22 0709  TempSrc:   PainSc: 0-No pain         Complications: No notable events documented.

## 2022-08-13 NOTE — Anesthesia Procedure Notes (Addendum)
Procedure Name: Intubation Date/Time: 08/13/2022 9:23 AM  Performed by: Jairo Ben, MDPre-anesthesia Checklist: Patient identified, Emergency Drugs available, Suction available and Patient being monitored Patient Re-evaluated:Patient Re-evaluated prior to induction Oxygen Delivery Method: Circle system utilized Preoxygenation: Pre-oxygenation with 100% oxygen Induction Type: IV induction Ventilation: Mask ventilation without difficulty Grade View: Grade II Tube type: Oral Tube size: 7.5 mm Number of attempts: 1 Airway Equipment and Method: Stylet and Oral airway Placement Confirmation: ETT inserted through vocal cords under direct vision, positive ETCO2 and breath sounds checked- equal and bilateral Secured at: 23 cm Tube secured with: Tape Dental Injury: Teeth and Oropharynx as per pre-operative assessment

## 2022-08-13 NOTE — H&P (Signed)
77 year old male follows up today with me for further discussion of his large bilateral stone burden.   The patient was seen not too long ago for signs and symptoms of infection. His urine culture was negative. Subsequently underwent a CT scan because of some pain and difficulty and was found to have a large UPJ stone in the right kidney. He had some smaller nonobstructing stones in the right as well. In addition, the patient had stones in his left kidney which appears to be atrophic.   The patient denies any ongoing hematuria. Denies any dysuria. The patient is having some urinary urgency. He is ready to tackle his stone burden, entire passing stone fragments.   The patient has a history of Parkinson's disease but seems to be doing quite well.   6/11: Today the patient is here because he is complaining of weakness, dizziness, and confusion. He is also increased urinary frequency and urgency. He was seen last week for flank pain, and his wife thinks that he might of passed something. He is complaining of no specific pain today. He has had slight nausea.   Interval: Today the patient is here for follow-up. I treated him for prostatitis at his last office visit. He states that his voiding symptoms have improved quite significantly with less frequency, urgency, and no ongoing hematuria. He also has less confusion. He is here today for preop appointment regarding the stone burden in the patient's right kidney.     ALLERGIES: Exelon CAPS Tamsulosin HCl CAPS    MEDICATIONS: Advil TABS on hold  Alprazolam 0.25 mg tablet 0 Oral PRN  Carbidopa-Levodopa 25 mg-100 mg tablet Oral  Fludrocortisone Acetate 0.1 mg tablet  Rasagiline Mesylate 1 mg tablet  Tylenol     GU PSH: Complex Uroflow - 2019 Cysto Bladder Stone <2.5cm - 2019 Cysto Bladder Stone >2.5cm - 09/12/2020 Cysto Remove Stent FB Sim - 09/23/2020 Cysto Uretero Lithotripsy - 2016, 2014, 2012 Cystoscopy - 2019 Cystoscopy Insert Stent - 2016,  2014, 2012, 2012 Cystoscopy TURP - 09/12/2020 ESWL - 2012, 2012 Locm 300-399Mg /Ml Iodine,1Ml - 08/07/2020 Ureteroscopic laser litho - 09/12/2020 Ureteroscopic stone removal - 2012 UROLIFT - 2019       PSH Notes: Cystoscopy With Ureteroscopy With Lithotripsy, Cystoscopy With Insertion Of Ureteral Stent Left, Cystoscopy With Insertion Of Ureteral Stent Left, Cystoscopy With Ureteroscopy With Lithotripsy, Lithotripsy, Cystoscopy With Ureteroscopy With Lithotripsy, Cystoscopy With Insertion Of Ureteral Stent Left, Lithotripsy, Cystoscopy With Ureteroscopy With Manipulation Of Calculus, Cystoscopy With Insertion Of Ureteral Stent, Brain stimulator (05/2015)   NON-GU PSH: Visit Complexity (formerly GPC1X) - 07/06/2022, 06/01/2022, 04/13/2022, 04/01/2022     GU PMH: Renal calculus - 07/14/2022, - 07/06/2022, - 06/01/2022 (Stable), - 04/13/2022 (Stable), - 2019, Bilateral kidney stones, - 2016 Gross hematuria - 07/06/2022 Bladder Stone - 04/13/2022, - 11/07/2020, - 09/23/2020, Bladder calculus, - 2014 BPH w/LUTS - 04/01/2022, - 11/07/2020, - 09/23/2020, - 08/01/2020 (Improving), Significant improvement from his Urolift procedure, - 2019 (Worsening), BPH with intolerance of alpha blockers. Obstructive symptomatology with visiual (cysto) and flow evidence of obstruction, - 2019, - 2019 (Stable), On uroxatral and recognizes significant improvement., - 2017 (Chronic), The patient's voiding symptoms have been stable while on Uroxatral. However, he does have some urge incontinence that has worsened over the past several years., - 2017, Benign localized prostatic hyperplasia with lower urinary tract symptoms (LUTS), - 2016 Renal and ureteral calculus - 04/01/2022 Urinary Tract Inf, Unspec site (Stable) - 04/01/2022, Urinary tract infection, - 2014 Dysuria - 05/27/2021, - 08/01/2020 Hemorrhagic  cystitis - 03/21/2021 Microscopic hematuria - 11/07/2020, - 08/07/2020 Nocturia - 11/07/2020, Nocturia, - 2016 Ureteral calculus - 09/23/2020,  Calculus of left ureter, - 2016 Nocturnal Enuresis - 08/01/2020 Urinary Urgency (Stable) - 2021, (Improving), Treating his constipation in addition to his bladder symptoms has improved his urgency significantly., - 2017 Urinary Obstruction - 2019 Urinary Frequency (Improving), Myrbetriq 25mg  has made a bid difference for him. PVR is minimal today. - 2017 LLQ pain, Abdominal pain, LLQ (left lower quadrant) - 2016 Urinary Retention, Unspec, Urinary retention - 2014 BPH w/o LUTS, Benign Prostatic Hypertrophy - 2014 Peyronies Disease, Peyronie's Disease - 2014      PMH Notes: Patient has Parkinson's.   He has a history of Nephrolithiasis - had right URS by Dr. Brunilda Payor in Aug 2016, non-obstructing stone in the right kidney from CT scan in May 2016.   The patient has a family history of prostate cancer. His father died of metastatic prostate cancer in his 39s.      NON-GU PMH: Bacteriuria - 07/14/2022 Calculus of gallbladder without cholecystitis without obstruction, Cholelithiasis - 2016 Nausea, Nausea - 2016 Encounter for general adult medical examination without abnormal findings, Encounter for preventive health examination - 2015 Anxiety, Anxiety (Symptom) - 2014 Parkinson's disease, Parkinson's Disease - 2014    FAMILY HISTORY: Brain Cancer - Father Father Deceased At Age39 ___ - Runs In Family Lung Cancer - Runs In Family Mother Deceased At Age 50 from diabetic complicati - Runs In Family rheumatoid arthritis - Mother Transient Ischemic Attack - Father   SOCIAL HISTORY: Marital Status: Married Preferred Language: English; Ethnicity: Not Hispanic Or Latino; Race: White Current Smoking Status: Patient has never smoked.   Tobacco Use Assessment Completed: Used Tobacco in last 30 days? Drinks 1 caffeinated drink per day.     Notes: Caffeine Use, Alcohol Use, Never A Smoker, Marital History - Currently Married   REVIEW OF SYSTEMS:    GU Review Male:   Patient denies frequent  urination, hard to postpone urination, burning/ pain with urination, get up at night to urinate, leakage of urine, stream starts and stops, trouble starting your stream, have to strain to urinate , erection problems, and penile pain.  Gastrointestinal (Upper):   Patient denies nausea, vomiting, and indigestion/ heartburn.  Gastrointestinal (Lower):   Patient denies diarrhea and constipation.  Constitutional:   Patient denies fever, night sweats, weight loss, and fatigue.  Skin:   Patient denies skin rash/ lesion and itching.  Eyes:   Patient denies blurred vision and double vision.  Ears/ Nose/ Throat:   Patient denies sore throat and sinus problems.  Hematologic/Lymphatic:   Patient denies swollen glands and easy bruising.  Cardiovascular:   Patient denies leg swelling and chest pains.  Respiratory:   Patient denies cough and shortness of breath.  Endocrine:   Patient denies excessive thirst.  Musculoskeletal:   Patient denies joint pain and back pain.  Neurological:   Patient denies headaches and dizziness.  Psychologic:   Patient denies depression and anxiety.   VITAL SIGNS: None   MULTI-SYSTEM PHYSICAL EXAMINATION:    Constitutional: Well-nourished. No physical deformities. Normally developed. Good grooming.  Respiratory: Normal breath sounds. No labored breathing, no use of accessory muscles.   Cardiovascular: Regular rate and rhythm. No murmur, no gallop. Normal temperature, normal extremity pulses, no swelling, no varicosities.      Complexity of Data:  Source Of History:  Patient  Records Review:   Previous Doctor Records, Previous Patient Records, POC Tool  Urine Test  Review:   Urinalysis, Urine Culture  X-Ray Review: C.T. Abdomen/Pelvis: Reviewed Films. Discussed With Patient.     07/25/20 08/03/19 02/23/17 10/01/15 08/01/13 11/20/10  PSA  Total PSA 2.45 ng/mL 2.18 ng/mL 3.15 ng/mL 2.24 ng/dl 1.61  0.96     PROCEDURES:          Visit Complexity - G2211           Urinalysis w/Scope Dipstick Dipstick Cont'd Micro  Color: Amber Bilirubin: Neg mg/dL WBC/hpf: 20 - 04/VWU  Appearance: Cloudy Ketones: Neg mg/dL RBC/hpf: 40 - 98/JXB  Specific Gravity: <=1.005 Blood: 3+ ery/uL Bacteria: Many (>50/hpf)  pH: 6.0 Protein: 3+ mg/dL Cystals: NS (Not Seen)  Glucose: Neg mg/dL Urobilinogen: 0.2 mg/dL Casts: NS (Not Seen)    Nitrites: Neg Trichomonas: Not Present    Leukocyte Esterase: 2+ leu/uL Mucous: Present      Epithelial Cells: 0 - 5/hpf      Yeast: NS (Not Seen)      Sperm: Not Present    ASSESSMENT:      ICD-10 Details  1 GU:   Renal calculus - N20.0   2   Gross hematuria - R31.0   3   Dysuria - R30.0    PLAN:           Orders Labs Urine Culture          Schedule Return Visit/Planned Activity: ASAP - Schedule Surgery          Document Letter(s):  Created for Patient: Clinical Summary         Notes:   The patient has a large burden of stone disease, especially on the right. He has passed several stones over the course of the last several months, and has had difficulty chronically with this issue. As such, we have opted to proceed with right PCNL.   As it relates to the surgery itself I discussed the fact that the patient would be prone, and explained the renal access component. We then discussed the stone removal process as well as a postprocedure stent placement. I discussed the risks of this operation which predominantly include bleeding and damage to the surrounding structures. I also described for him the possibility of difficulty obtaining access which may require an additional surgery. After going through surgery, potential complications, and expected outcome, the patient has agreed to proceed with the operation.   Will plan to start the patient on antibiotics 7 days prior to his surgery to ensure that the patient has been cleared of infection prior to instrumentation.

## 2022-08-13 NOTE — Op Note (Signed)
Pre-operative diagnosis: right sided nephrolithiasis, >2.0 centimeters  Post-operative diagnosis: as above    Procedure performed:  cystoscopy, right retrograde pyelogram with interpretation, right percutaneous renal access, right nephrolithotomy, right nephrostogram,  right ureteral stent exchange  Surgeon: Dr. Crist Fat  Assistant: none   Anesthesia: General   Complications: None   Specimens: discarded   Findings: #1. Mid-pole access, b/w 11 and 12th rib #2. Retrograde milligram demonstrated a large filling defect in the pelvis as well as in the lower pole of the patient's known stone disease we will repeat it into the left hydroureteronephrosis.  No there are no other significant abnormalities.   EBL: Approximately 100 cc   Specimens: stone from collecting system - taken to [facility] lab   Indication: Indication: [name] [name] is a 77 y.o.  patient with  a large burden right nephrolithiasis. The patient presented today for follow-up definitive management. After  reviewing the management options for treatment, elected to proceed with the above surgical procedure(s). We have discussed the potential benefits and risks of the procedure, side effects of the proposed treatment, the likelihood of the patient achieving the goals of the procedure, and any potential problems that might occur during the procedure or recuperation. Informed consent has been obtained.     Description:  Consent was obtained in the preoperative holding area. The patient was marked appropriately and then taken back to the operating room where they were intubated on the gurney. The patient was flipped prone onto the OR table. Large jelly rolls were placed in the anterior axillary line on both sides allowing the patient's chest and abdomen to fall inbetween. The patient was then prepped and draped in the routine sterile fashion in the right flank. A timeout was then held confirming the proper  side and procedure as well as antibiotics were administered.  I then used the flexible cystoscope and passed gently into the patient's urethra under visual guidance.  I cannulated the patient's right ureteral orifice and advanced a wire up into the patient's bladder.  I then remove the cystoscope and exchanged the wire for an open-ended catheter, and the Pollack catheter was then advanced up to the UPJ.  A  retrograde pyelogram was performed with the above findings. I then turned my attention to the patient's right flank and obtaining percutaneous renal access.  Using the C-arm rotated at 30 and the bulls-eye technique with an 18-gauge coaxial needle the mid pole posterior lateral calyx was targeted. Then rotating the C-arm AP depth of our needle was noted to be within the calyx and the inner part of the coaxial needle was removed. Urine was noted to return. A 0.038 sensor wire was then passed through the sheath of the coaxial needle and into the right renal collecting system. The wire was then passed down the ureter and into the bladder using fluoroscopic guide and the sheath of the needle was removed.  Was unable to get the sensor wire down to the bladder.  History suggests then advanced a dual-lumen just distal catheter over the wire into the calyx.  Exchanged the sensor wire for a second wire and an elevated second wire of the individual its bladder.  I remove the dual-lumen catheter leading to Super Stiff wires in the urinary tract.   The 30 French NephroMax balloon was then passed over one of the Super Stiff wires and the tip guided down into the targeted calyx. The balloon was then inflated to approximately 12 atm, and once  there was no waist noted under fluoroscopy the access sheath was advanced over the balloon. The balloon was then removed. The wires were then placed back into the sheaths and snapped to the drape.   Using the rigid nephroscope to explore the targeted calyx and kidney.  The stone  in the renal pelvis was targeted using the pulmonology device.  Then using a flexible cystoscope to navigate the remaining calyces of the kidney multiple smaller stone fragments encountered.  Using the 0 tip basket these fragments were grabbed and removed. Contrast was injected through the cystoscope and the calyces systematically inspected under fluoroscopic guidance to ensure that all stone fragments had been removed.   Then using the flexible cystoscope the proximal ureter was navigated in antegrade fashion.  No stone fragments were noted within the ureter. Once the ureter was clear a 0.038 sensor wire was left in the bladder and the scope backed out over wire. The sensor wire was then backloaded over the rigid nephroscope using the stent pusher and a 26 cm x 6 French double-J ureteral stent was passed antegrade over the sensor wire down into the bladder under fluoroscopic guidance. Once the stent was in the bladder the wire was gently pulled back and a nice curl noted in the bladder. The wire completely removed from the stent, and nice curl on the proximal end of the stent was noted in the renal pelvis. The sheath was then backed out slowly to ensure that all calyces had been inspected and there was nothing behind the sheath.   A 14F ainsworth tip catheter was then passed over one of the Super Stiff wires through the sheath and into the renal pelvis. The sheath was then backed out of the kidney and cut over the red rubber catheter. A nephrostogram was then performed confirming the position of our nephrostomy tube and reassuring that there were no longer any filling defects from the patient's symptoms.  After several minutes of direct pressure and observation was noted that there was no significant bleeding from the nephrostomy tube or around the nephrostomy tube tract.  As such, I remove the nephrostomy tube as well as the safety wire. 25 cc of local anesthesia was then injected into the patient's wound, and  the wound was closed with 3-0 nylon in 2 vertical mattress sutures. The incision was then padded using a bundle of 4 x 4's and Hypafix tape. Patient was subsequently rolled over to the supine position and extubated. The patient was returned to the PACU in excellent condition. At the end of the case all lap and needle and sponges were accounted for. There are no perioperative complications.

## 2022-08-13 NOTE — Discharge Instructions (Signed)
Discharge instructions following PCNL  Call your doctor for: Fevers greater than 100.5 Severe nausea or vomiting Increasing pain not controlled by pain medication Increasing redness or drainage from incisions Decreased urine output or a catheter is no longer draining  The number for questions is [phone].  Activity: Gradually increase activity with short frequent walks, 3-4 times a day.  Avoid strenuous activities, like sports, lawn-mowing, or heavy lifting (more than 10-15 pounds).  Wear loose, comfortable clothing that pull or kink the tube or tubes.  Do not drive while taking pain medication, or until your doctor permitts it.  Bathing and dressing changes: You should not shower for 48 hours after surgery.  Do not soak your back in a bathtub.  Diet: It is extremely important to drink plenty of fluids after surgery, especially water.  You may resume your regular diet, unless otherwise instructed.  Medications: May take Tylenol (acetaminophen) or ibuprofen (Advil, Motrin) as directed over-the-counter. Take any prescriptions as directed.  Follow-up appointments: Follow-up appointment will be scheduled with Dr. Augusta Mirkin in 10-14 days for hospital check and stent removal.  

## 2022-08-14 ENCOUNTER — Encounter (HOSPITAL_COMMUNITY): Payer: Self-pay | Admitting: Urology

## 2022-08-14 DIAGNOSIS — N200 Calculus of kidney: Secondary | ICD-10-CM | POA: Diagnosis not present

## 2022-08-14 LAB — BASIC METABOLIC PANEL
Anion gap: 6 (ref 5–15)
BUN: 17 mg/dL (ref 8–23)
CO2: 23 mmol/L (ref 22–32)
Calcium: 8.3 mg/dL — ABNORMAL LOW (ref 8.9–10.3)
Chloride: 104 mmol/L (ref 98–111)
Creatinine, Ser: 1.07 mg/dL (ref 0.61–1.24)
GFR, Estimated: >60 mL/min (ref >60)
Glucose, Bld: 143 mg/dL — ABNORMAL HIGH (ref 70–99)
Potassium: 3.7 mmol/L (ref 3.5–5.1)
Sodium: 133 mmol/L — ABNORMAL LOW (ref 135–145)

## 2022-08-14 LAB — CBC
HCT: 32.8 % — ABNORMAL LOW (ref 39.0–52.0)
Hemoglobin: 10.8 g/dL — ABNORMAL LOW (ref 13.0–17.0)
MCH: 31.9 pg (ref 26.0–34.0)
MCHC: 32.9 g/dL (ref 30.0–36.0)
MCV: 96.8 fL (ref 80.0–100.0)
Platelets: 241 10*3/uL (ref 150–400)
RBC: 3.39 MIL/uL — ABNORMAL LOW (ref 4.22–5.81)
RDW: 12.4 % (ref 11.5–15.5)
WBC: 10.7 10*3/uL — ABNORMAL HIGH (ref 4.0–10.5)
nRBC: 0 % (ref 0.0–0.2)

## 2022-08-14 NOTE — Progress Notes (Signed)
Pt's foley catheter removed at this time, tolerated well.

## 2022-08-14 NOTE — Plan of Care (Signed)
  Problem: Education: Goal: Knowledge of General Education information will improve Description Including pain rating scale, medication(s)/side effects and non-pharmacologic comfort measures Outcome: Progressing   Problem: Health Behavior/Discharge Planning: Goal: Ability to manage health-related needs will improve Outcome: Progressing   

## 2022-08-14 NOTE — Plan of Care (Signed)

## 2022-08-14 NOTE — Discharge Summary (Signed)
Date of admission: 08/13/2022  Date of discharge: 08/14/2022  Admission diagnosis: right renal stones  Discharge diagnosis: same  Secondary diagnoses:  Patient Active Problem List   Diagnosis Date Noted   Nephrolithiasis 08/13/2022   Bladder stones 09/13/2020   Gross hematuria 09/12/2020   Orthostatic hypotension 10/27/2019   Acute pyelonephritis 12/16/2012   Sepsis (HCC) 12/15/2012   Parkinson's disease 12/15/2012    Procedures performed: Procedure(s): RIGHT NEPHROLITHOTOMY PERCUTANEOUS  History and Physical: For full details, please see admission history and physical. Briefly, [name] [name] is a 77 y.o. year old patient with large right sided renal stones.   Hospital Course: Patient tolerated the procedure well.  He was then transferred to the floor after an uneventful PACU stay.  His hospital course was uncomplicated.  On POD#1 he had met discharge criteria: was eating a regular diet, was up and ambulating independently,  pain was well controlled, was voiding without a catheter, and was ready to for discharge.  PE: NAD Vitals:   08/13/22 1447 08/13/22 1939 08/13/22 2340 08/14/22 0334  BP: (!) 163/79 (!) 119/58 (!) 122/58 135/67  Pulse: 85 80 73 69  Resp: 20 20 18 18   Temp: 98.8 F (37.1 C) 97.6 F (36.4 C) 98.2 F (36.8 C) 98.4 F (36.9 C)  TempSrc: Oral Oral Oral Oral  SpO2: 97% 96% 96% 97%    Intake/Output Summary (Last 24 hours) at 08/14/2022 0746 Last data filed at 08/14/2022 1610 Gross per 24 hour  Intake 1960 ml  Output 2750 ml  Net -790 ml   Non-labored breathing Abdomen is soft/ND Right flank incision is dressed c/d/I Extremities are symmetric   Laboratory values:  Recent Labs    08/14/22 0425  WBC 10.7*  HGB 10.8*  HCT 32.8*   Recent Labs    08/14/22 0425  NA 133*  K 3.7  CL 104  CO2 23  GLUCOSE 143*  BUN 17  CREATININE 1.07  CALCIUM 8.3*   No results for input(s): "LABPT", "INR" in the last 72 hours. No results for input(s):  "LABURIN" in the last 72 hours. Results for orders placed or performed during the hospital encounter of 08/07/22  Urine Culture     Status: Abnormal   Collection Time: 08/07/22  2:09 PM   Specimen: Urine, Clean Catch  Result Value Ref Range Status   Specimen Description   Final    URINE, CLEAN CATCH Performed at [facility], [address] [place], [place] [address]    Special Requests   Final    NONE Performed at [facility], [address] [place], [place] [address]    Culture (A)  Final    <10,000 COLONIES/mL INSIGNIFICANT GROWTH Performed at [facility], [address] [place], [place] [address]    Report Status 08/08/2022 FINAL  Final    Disposition: Home  Discharge instruction: The patient was instructed to be ambulatory but told to refrain from heavy lifting, strenuous activity, or driving.   Discharge medications:  Allergies as of 08/14/2022       Reactions   Rivastigmine Tartrate Nausea And Vomiting    Can tolerate the patch but can not tolerate the pills   Tamsulosin Other (See Comments)   Severe joint pain   Alfuzosin    Other reaction(s): Other (See Comments) hypotension        Medication List     TAKE these medications    ALPRAZolam 0.25 MG tablet Commonly known as: XANAX Take  0.5 mg by mouth 2 (two) times daily as needed for anxiety.   benzonatate 100 MG capsule Commonly known as: TESSALON Take 1 capsule (100 mg total) by mouth 3 (three) times daily as needed for cough. Do not take with alcohol or while driving or operating heavy machinery   calcium carbonate 500 MG chewable tablet Commonly known as: TUMS - dosed in mg elemental calcium Chew 1 tablet by mouth daily as needed for indigestion or heartburn.   carbidopa-levodopa 25-100 MG tablet Commonly known as: SINEMET IR Take 2.5 tablets by mouth 4 (four) times daily.   carbidopa-levodopa 50-200 MG tablet Commonly known as: SINEMET CR Take 1  tablet by mouth at bedtime.   ferrous sulfate 325 (65 FE) MG tablet Take 1 tablet (325 mg total) by mouth daily.   fludrocortisone 0.1 MG tablet Commonly known as: FLORINEF TAKE 2 TABLETS (0.2 MG TOTAL) BY MOUTH DAILY. What changed:  how much to take when to take this   sulfamethoxazole-trimethoprim 800-160 MG tablet Commonly known as: BACTRIM DS Take 1 tablet by mouth every 12 (twelve) hours.   traMADol 50 MG tablet Commonly known as: ULTRAM Take 1-2 tablets (50-100 mg total) by mouth every 6 (six) hours as needed for moderate pain.   triamcinolone cream 0.1 % Commonly known as: KENALOG Apply 1 application topically daily as needed (eczema).        Followup:   Follow-up Information     Hillery Aldo, NP Follow up on 08/26/2022.   Specialty: Nurse Practitioner Why: 11:30, Stent removal Contact information: [address] [place] [address] [phone]

## 2022-08-14 NOTE — Progress Notes (Signed)
Pt d/c'd prior to completion of TOC assessment. 

## 2023-04-01 ENCOUNTER — Encounter: Payer: Self-pay | Admitting: Neurology

## 2023-04-05 NOTE — Progress Notes (Unsigned)
 Assessment/Plan:  1.  Parkinsons disease, with dyskinesia with motor fluctuations, diagnosed 2006  -Status post left STN DBS at [facility] in 2014 and status post right STN DBS at [facility] in 2017.  He had his IPGs changed in December, 2022, and both were EOS at the time.  These were changed out by Dr. Carlyn Reichert.  Patient with Medtronic device.  Current batteries are not EOS.  He would like to consider a rechargeable battery in the future.  -[facility] notes that speech difficulties persisted even with changing DBS settings.  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  Would likely do best with outpatient speech therapy, along with his speech device.  -[facility] notes indicate that patient had dystonic posturing of the right arm and hands, even when DBS was off/EOS, but patient notes that he sometimes turns the DBS off just to get relief from the dystonic posturing and pulling of the right arm and then turns it back on.  His appointment was quite lengthy today, but we may need to look into programming of that, although patient was generally happy with the programming of the device.  -[facility] is noted that patient has never had control of right lower extremity tremor, although I did not see much tremor today in the right leg.  -Patient is having significant off, especially when he goes to restaurants.  In fact, his wife notes that it is almost always at restaurants.  This makes me think that he is having protein interference with the levodopa and we discussed this today.  He was also given samples of Inbrija.  He was shown how to use it.  They can let us know how he does with this.  -I did slightly change the timing of his medication.  He was told to take carbidopa/levodopa, 2-2.5 tablets 4 times per day at 7am/10:30am/2pm/5:30pm  -He will continue carbidopa/levodopa 50/200 CR at bedtime  -He has been on rasagiline in the past, but is currently on selegiline, 5 mg twice per day.   I may discontinue that in the future.  I do not think it is particularly helpful, nor does the patient.  -We discussed Vyalev, which is foscarbidopa/foslevodopa pump that is newly FDA approved.  We discussed that it is for motor fluctuations in adults with advanced Parkinson's disease.  We discussed that this likely will not be on Medicare formulary until the latter half of 2025.  We discussed risks and benefits of this drug.  Right now, this is not available to him, but certainly could be beneficial in the future.  -He does have a U step at home.  He also walked on our upright U step actually did quite well with that compared to the regular walkers.  2.  Neurogenic Orthostatic Hypotension  -Sounds like syncope has been a big issue for him and he follows with cardiology for this.  -His Florinef was just increased to 0.1 mg 3 times per day (from twice daily) a few weeks ago.  -Sounds like he was on midodrine in the past for just a few pills and he thought it caused more anxiety.  Subjective:   [name] [name] was seen today in the movement disorders clinic for neurologic consultation at the request of Clance Boll, MD.  The consultation is for the evaluation of Parkinsons disease.  Pt with son who supplements hx.  Patient has been following with [facility] since 2016.  He has also  been treated at the [facility] and the [facility], so limited records are available through those systems.  At [facility], he was originally following with Dr. Ardyth Man and has mostly been following with PA, Johny Shears since 2018.  Patient was apparently diagnosed with Parkinsons disease in 2006 and treated with DBS in 2014 by Dr. Alan Ripper at [facility].  This was apparently intended for a bilateral procedure, but the length of the procedure precluded that and he underwent a unilateral left STN DBS instead.  He ultimately went to [facility] for another opinion and underwent right STN DBS April 09, 2015.  There are periods of time where he had not  been seen at [facility].  He was last seen in January, 2023.  His IPG's were last changed out December, 2022 by Dr. Carlyn Reichert.  They had wondered if his DBS was affecting his speech and they turned it down the DBS, but that made tremors worse and did not improve the speech.  [facility] notes indicate that patient has dystonic posturing of the right arm and hand even when DBS is off (but pt states that he will cut the device off for an hour and it gets better and he will turn it back on).  They have also noted that patient has never had control of right lower extremity tremor with DBS.  The last CT brain that I have is from March, 2017 (I only have the report)  He does have some falls but generally not without the walker.  He was getting the walker out of the jeep and fell.  The falls that he describes are syncopal episodes - was getting up from dining room table and fell and passed out.    He has gotten stuck stuck in restaurants before.    Current movement disorder medications: Carbidopa/levodopa 25/100, 2.5 tablets 4 times per day (he has been 2 po qid) - 6am/10am/2pm/6pm Carbidopa/levodopa 50/200 at bedtime Rasagiline 1 mg daily (now on selegeline bid) Florinef 0.1 mg tid (6am/11am/5pm) - increased from twice daily a few weeks ago    PREVIOUS MEDICATIONS: carbidopa levodopa; rotigotine patch (compulsive behavior); clonazepam (stopped because of increased falls, but he was on a large dose of up to 2 mg); rasagiline; selegiline; midodrine (sounds like he tried it once and thought he had some anxiety and jitteriness with it)  ALLERGIES:   Allergies  Allergen Reactions   Rivastigmine Tartrate Nausea And Vomiting     Can tolerate the patch but can not tolerate the pills   Tamsulosin Other (See Comments)    Severe joint pain   Alfuzosin     Other reaction(s): Other (See Comments) hypotension    CURRENT MEDICATIONS:  Current Meds  Medication Sig   selegiline (ELDEPRYL) 5 MG capsule Take 5 mg by mouth 2  (two) times daily before a meal.   ALPRAZolam (XANAX) 0.25 MG tablet Take 0.5 mg by mouth 2 (two) times daily as needed for anxiety.   calcium carbonate (TUMS - DOSED IN MG ELEMENTAL CALCIUM) 500 MG chewable tablet Chew 1 tablet by mouth daily as needed for indigestion or heartburn.   carbidopa-levodopa (SINEMET CR) 50-200 MG tablet Take 1 tablet by mouth at bedtime.   carbidopa-levodopa (SINEMET) 25-100 MG per tablet Take 2.5 tablets by mouth 4 (four) times daily.   fludrocortisone (FLORINEF) 0.1 MG tablet TAKE 2 TABLETS (0.2 MG TOTAL) BY MOUTH DAILY. (Patient taking differently: Take 0.1 mg by mouth 3 (three) times daily.)     Objective:   VITALS:  Vitals:   04/06/23 1335  BP: 130/78  Pulse: 67  SpO2: 98%  Weight: 156 lb 12.8 oz (71.1 kg)  Height: 5\' 10"  (1.778 m)    GEN:  The patient appears stated age and is in NAD. HEENT:  Normocephalic, atraumatic.  The mucous membranes are moist. The superficial temporal arteries are without ropiness or tenderness. CV:  RRR Lungs:  CTAB Neck/HEME:  There are no carotid bruits bilaterally.  Neurological examination:  Orientation: The patient is alert and oriented x3.  Cranial nerves: There is good facial symmetry. Extraocular muscles are intact. The visual fields are full to confrontational testing. The speech is fluent and very hypophonic (virtually untelligible at times and always at a whisper). Soft palate rises symmetrically and there is no tongue deviation. Hearing is intact to conversational tone. Sensation: Sensation is intact to light and pinprick throughout (facial, trunk, extremities). Vibration is intact at the bilateral big toe. There is no extinction with double simultaneous stimulation. There is no sensory dermatomal level identified. Motor: Strength is 5/5 in the bilateral upper and lower extremities.      Movement examination: Tone: There is mild increased tone in the right upper extremity.  The right upper extremity is  mildly dystonic.  Otherwise, tone is normal. Abnormal movements: rare dyskinesia on the R Coordination:  There is mild decremation with RAM's, with finger taps on the right Gait and Station: The patient pushes off to arise.  We tried him on several walkers, but ultimately he does festinate and has freezing and drags the right leg. I have reviewed and interpreted the following labs independently   Chemistry      Component Value Date/Time   NA 133 (L) 08/14/2022 0425   K 3.7 08/14/2022 0425   CL 104 08/14/2022 0425   CO2 23 08/14/2022 0425   BUN 17 08/14/2022 0425   CREATININE 1.07 08/14/2022 0425      Component Value Date/Time   CALCIUM 8.3 (L) 08/14/2022 0425   ALKPHOS 61 09/11/2014 0615   AST 15 09/11/2014 0615   ALT 5 (L) 09/11/2014 0615   BILITOT 0.9 09/11/2014 0615      No results found for: "TSH" Lab Results  Component Value Date   WBC 10.7 (H) 08/14/2022   HGB 10.8 (L) 08/14/2022   HCT 32.8 (L) 08/14/2022   MCV 96.8 08/14/2022   PLT 241 08/14/2022     Total time spent on today's visit was 80  minutes, including both face-to-face time and nonface-to-face time.  Time included that spent on review of records (prior notes available to me/labs/imaging if pertinent), discussing treatment and goals, answering patient's questions and coordinating care.  This did not include the DBS time.  Cc:  Bedelia Person, MD

## 2023-04-06 ENCOUNTER — Encounter: Payer: Self-pay | Admitting: Neurology

## 2023-04-06 ENCOUNTER — Ambulatory Visit (INDEPENDENT_AMBULATORY_CARE_PROVIDER_SITE_OTHER): Payer: No Typology Code available for payment source | Admitting: Neurology

## 2023-04-06 VITALS — BP 130/78 | HR 67 | Ht 70.0 in | Wt 156.8 lb

## 2023-04-06 DIAGNOSIS — G20B2 Parkinson's disease with dyskinesia, with fluctuations: Secondary | ICD-10-CM

## 2023-04-06 DIAGNOSIS — Z9689 Presence of other specified functional implants: Secondary | ICD-10-CM

## 2023-04-06 DIAGNOSIS — R498 Other voice and resonance disorders: Secondary | ICD-10-CM

## 2023-04-06 DIAGNOSIS — G249 Dystonia, unspecified: Secondary | ICD-10-CM | POA: Diagnosis not present

## 2023-04-06 DIAGNOSIS — G903 Multi-system degeneration of the autonomic nervous system: Secondary | ICD-10-CM

## 2023-04-06 MED ORDER — CARBIDOPA-LEVODOPA 25-100 MG PO TABS: ORAL_TABLET | ORAL | Status: DC

## 2023-04-06 NOTE — Patient Instructions (Signed)
 We are going to start inbrija.  Remember that TWO capsules is ONE dosage (never inhale just one capsule).  You can inhale the capsules as needed up to 5 times per day, separated by 2 hour intervals.  Many patients use this right when the wake up to help with first morning "on" and then as needed during the day.  You should take a sip of water prior to using the inhaler to avoid side effects.  It may generate some cough right when you use it and that is normal.  There are nurse educators available to help you with this device.  You can call [phone] and they will set you up with a nurse educator to assist you for free of charge.  They are available 8am-8pm Monday-Friday.   Take carbidopa/levodopa, 2-2.5 tablets 4 times per day at 7am/10:30am/2pm/5:30pm Take carbidopa/levodopa 50/200 at bedtime

## 2023-04-06 NOTE — Procedures (Signed)
 DBS Programming was performed.    Manufacturer of DBS device: Medtronic  Total time spent programming was 10 minutes.  Device was confirmed to be on.  Soft start was confirmed to be on.  Impedences were checked and were within normal limits.  Battery was checked and was determined to be functioning normally and not near the end of life.  Final settings were as follows:   Active Contact Amplitude (mA) PW (ms) Frequency (hz) Side Effects Battery  Left Brain        04/06/22 1-2+ 2.4 60 185  2.95                          Right Brain        04/06/23 2-C+ 2.5 60 185  2.94

## 2023-05-16 ENCOUNTER — Emergency Department (HOSPITAL_COMMUNITY)

## 2023-05-16 ENCOUNTER — Encounter (HOSPITAL_COMMUNITY): Payer: Self-pay | Admitting: *Deleted

## 2023-05-16 ENCOUNTER — Emergency Department (HOSPITAL_COMMUNITY)
Admission: EM | Admit: 2023-05-16 | Discharge: 2023-05-16 | Disposition: A | Discharge status: Home / Self Care | Attending: Emergency Medicine | Admitting: Emergency Medicine

## 2023-05-16 ENCOUNTER — Other Ambulatory Visit: Payer: Self-pay

## 2023-05-16 DIAGNOSIS — S2249XS Multiple fractures of ribs, unspecified side, sequela: Secondary | ICD-10-CM

## 2023-05-16 DIAGNOSIS — E876 Hypokalemia: Secondary | ICD-10-CM | POA: Diagnosis not present

## 2023-05-16 DIAGNOSIS — S2242XA Multiple fractures of ribs, left side, initial encounter for closed fracture: Secondary | ICD-10-CM | POA: Insufficient documentation

## 2023-05-16 DIAGNOSIS — R0781 Pleurodynia: Secondary | ICD-10-CM | POA: Diagnosis present

## 2023-05-16 DIAGNOSIS — W2203XA Walked into furniture, initial encounter: Secondary | ICD-10-CM | POA: Insufficient documentation

## 2023-05-16 DIAGNOSIS — G20C Parkinsonism, unspecified: Secondary | ICD-10-CM | POA: Insufficient documentation

## 2023-05-16 DIAGNOSIS — W19XXXA Unspecified fall, initial encounter: Secondary | ICD-10-CM

## 2023-05-16 LAB — CBC WITH DIFFERENTIAL/PLATELET
Abs Immature Granulocytes: 0.05 10*3/uL (ref 0.00–0.07)
Basophils Absolute: 0 10*3/uL (ref 0.0–0.1)
Basophils Relative: 0 %
Eosinophils Absolute: 0.1 10*3/uL (ref 0.0–0.5)
Eosinophils Relative: 1 %
HCT: 33.4 % — ABNORMAL LOW (ref 39.0–52.0)
Hemoglobin: 11.3 g/dL — ABNORMAL LOW (ref 13.0–17.0)
Immature Granulocytes: 0 %
Lymphocytes Relative: 11 %
Lymphs Abs: 1.4 10*3/uL (ref 0.7–4.0)
MCH: 31.7 pg (ref 26.0–34.0)
MCHC: 33.8 g/dL (ref 30.0–36.0)
MCV: 93.6 fL (ref 80.0–100.0)
Monocytes Absolute: 0.8 10*3/uL (ref 0.1–1.0)
Monocytes Relative: 7 %
Neutro Abs: 9.8 10*3/uL — ABNORMAL HIGH (ref 1.7–7.7)
Neutrophils Relative %: 81 %
Platelets: 282 10*3/uL (ref 150–400)
RBC: 3.57 MIL/uL — ABNORMAL LOW (ref 4.22–5.81)
RDW: 12.8 % (ref 11.5–15.5)
WBC: 12.2 10*3/uL — ABNORMAL HIGH (ref 4.0–10.5)
nRBC: 0 % (ref 0.0–0.2)

## 2023-05-16 LAB — BASIC METABOLIC PANEL WITH GFR
Anion gap: 9 (ref 5–15)
BUN: 23 mg/dL (ref 8–23)
CO2: 30 mmol/L (ref 22–32)
Calcium: 8.7 mg/dL — ABNORMAL LOW (ref 8.9–10.3)
Chloride: 99 mmol/L (ref 98–111)
Creatinine, Ser: 0.92 mg/dL (ref 0.61–1.24)
GFR, Estimated: >60 mL/min (ref >60)
Glucose, Bld: 105 mg/dL — ABNORMAL HIGH (ref 70–99)
Potassium: 2.7 mmol/L — CL (ref 3.5–5.1)
Sodium: 138 mmol/L (ref 135–145)

## 2023-05-16 MED ORDER — POTASSIUM CHLORIDE CRYS ER 20 MEQ PO TBCR: 20.0000 meq | EXTENDED_RELEASE_TABLET | Freq: Every day | ORAL | 0 refills | Status: DC

## 2023-05-16 MED ORDER — OXYCODONE-ACETAMINOPHEN 5-325 MG PO TABS
1.0000 | ORAL_TABLET | Freq: Once | ORAL | Status: AC
  Administered 2023-05-16: 1 via ORAL
  Filled 2023-05-16: qty 1

## 2023-05-16 MED ORDER — POTASSIUM CHLORIDE CRYS ER 20 MEQ PO TBCR
40.0000 meq | EXTENDED_RELEASE_TABLET | Freq: Once | ORAL | Status: AC
  Administered 2023-05-16: 40 meq via ORAL
  Filled 2023-05-16: qty 2

## 2023-05-16 MED ORDER — OXYCODONE-ACETAMINOPHEN 5-325 MG PO TABS: 1.0000 | ORAL_TABLET | Freq: Four times a day (QID) | ORAL | 0 refills | Status: DC | PRN

## 2023-05-16 NOTE — ED Triage Notes (Signed)
 Pt fell today and now with left rib pain into door frame.  Denies hitting his head.  Pt has Parkinson's disease.

## 2023-05-16 NOTE — Discharge Instructions (Addendum)
 Follow-up with your doctor for recheck and to see how your potassium is

## 2023-05-16 NOTE — ED Notes (Signed)
Went over d/c paperwork at this time with patient. Pt had no questions, comments or concerns after review and verbally understood them.

## 2023-05-17 NOTE — ED Provider Notes (Signed)
 [place] EMERGENCY DEPARTMENT AT [facility] Provider Note   CSN: [id number] Arrival date & time: 05/16/23  2050     History  Chief Complaint  Patient presents with   [name] [name] is a 78 y.o. male.  Pt has Parkinson disease and fell and complains of left rib pain.  He fell on the door frame.  Did not hit his head  The history is provided by the patient and medical records. No language interpreter was used.  Fall This is a new problem. The problem occurs rarely. The problem has been resolved. Associated symptoms include chest pain. Pertinent negatives include no abdominal pain and no headaches. Exacerbated by: Movement. Nothing relieves the symptoms. He has tried nothing for the symptoms.       Home Medications Prior to Admission medications   Medication Sig Start Date End Date Taking? Authorizing Provider  oxyCODONE-acetaminophen (PERCOCET/ROXICET) 5-325 MG tablet Take 1 tablet by mouth every 6 (six) hours as needed for severe pain (pain score 7-10). 05/16/23  Yes Bethann Berkshire, MD  potassium chloride SA (KLOR-CON M) 20 MEQ tablet Take 1 tablet (20 mEq total) by mouth daily. 05/16/23  Yes Bethann Berkshire, MD  ALPRAZolam Prudy Feeler) 0.25 MG tablet Take 0.5 mg by mouth 2 (two) times daily as needed for anxiety.    [provider]  calcium carbonate (TUMS - DOSED IN MG ELEMENTAL CALCIUM) 500 MG chewable tablet Chew 1 tablet by mouth daily as needed for indigestion or heartburn.    [provider]  carbidopa-levodopa (SINEMET CR) 50-200 MG tablet Take 1 tablet by mouth at bedtime. 11/27/20   [provider]  carbidopa-levodopa (SINEMET IR) 25-100 MG tablet 2-2.5 tablets 4 times per day at 7am/10:30am/2pm/5:30pm 04/06/23   Tat, Octaviano Batty, DO  fludrocortisone (FLORINEF) 0.1 MG tablet TAKE 2 TABLETS (0.2 MG TOTAL) BY MOUTH DAILY. Patient taking differently: Take 0.1 mg by mouth 3 (three) times daily. 11/24/19   Runell Gess, MD  selegiline  (ELDEPRYL) 5 MG capsule Take 5 mg by mouth 2 (two) times daily before a meal.    [provider]  sulfamethoxazole-trimethoprim (BACTRIM DS) 800-160 MG tablet Take 1 tablet by mouth every 12 (twelve) hours. 09/13/20   Crist Fat, MD  triamcinolone cream (KENALOG) 0.1 % Apply 1 application topically daily as needed (eczema).    [provider]      Allergies    Rivastigmine tartrate, Tamsulosin, and Alfuzosin    Review of Systems   Review of Systems  Constitutional:  Negative for appetite change and fatigue.  HENT:  Negative for congestion, ear discharge and sinus pressure.   Eyes:  Negative for discharge.  Respiratory:  Negative for cough.   Cardiovascular:  Positive for chest pain.  Gastrointestinal:  Negative for abdominal pain and diarrhea.  Genitourinary:  Negative for frequency and hematuria.  Musculoskeletal:  Negative for back pain.  Skin:  Negative for rash.  Neurological:  Negative for seizures and headaches.  Psychiatric/Behavioral:  Negative for hallucinations.     Physical Exam Updated Vital Signs BP (!) 167/88   Pulse 73   Temp (!) 97.5 F (36.4 C) (Oral)   Resp 18   Ht 5\' 10"  (1.778 m)   Wt 74.8 kg   SpO2 95%   BMI 23.68 kg/m  Physical Exam Vitals and nursing note reviewed.  Constitutional:      Appearance: He is well-developed.  HENT:     Head: Normocephalic.  Nose: Nose normal.  Eyes:     General: No scleral icterus.    Conjunctiva/sclera: Conjunctivae normal.  Neck:     Thyroid: No thyromegaly.  Cardiovascular:     Rate and Rhythm: Normal rate and regular rhythm.     Heart sounds: No murmur heard.    No friction rub. No gallop.  Pulmonary:     Breath sounds: No stridor. No wheezing or rales.     Comments: Tender left lateral ribs Chest:     Chest wall: No tenderness.  Abdominal:     General: There is no distension.     Tenderness: There is no abdominal tenderness. There is no rebound.  Musculoskeletal:         General: Normal range of motion.     Cervical back: Neck supple.  Lymphadenopathy:     Cervical: No cervical adenopathy.  Skin:    Findings: No erythema or rash.  Neurological:     Mental Status: He is alert and oriented to person, place, and time.     Motor: No abnormal muscle tone.     Coordination: Coordination normal.  Psychiatric:        Behavior: Behavior normal.     ED Results / Procedures / Treatments   Labs (all labs ordered are listed, but only abnormal results are displayed) Labs Reviewed  CBC WITH DIFFERENTIAL/PLATELET - Abnormal; Notable for the following components:      Result Value   WBC 12.2 (*)    RBC 3.57 (*)    Hemoglobin 11.3 (*)    HCT 33.4 (*)    Neutro Abs 9.8 (*)    All other components within normal limits  BASIC METABOLIC PANEL WITH GFR - Abnormal; Notable for the following components:   Potassium 2.7 (*)    Glucose, Bld 105 (*)    Calcium 8.7 (*)    All other components within normal limits    EKG None  Radiology DG Ribs Unilateral W/Chest Left Result Date: 05/16/2023 CLINICAL DATA:  Fall, left rib pain EXAM: LEFT RIBS AND CHEST - 3+ VIEW COMPARISON:  None Available. FINDINGS: Mild left basilar atelectasis. No pneumothorax or pleural effusion. Cardiac size within normal limits. Pulmonary vascularity is. Neurostimulator battery packs overlie the upper lung zones bilaterally. There is an acute nondisplaced fracture of the left 9-10 ribs posterolaterally. Osseous structures are otherwise intact. IMPRESSION: Acute left rib fractures.  No pneumothorax. Electronically Signed   By: Worthy Heads M.D.   On: 05/16/2023 21:39    Procedures Procedures    Medications Ordered in ED Medications  oxyCODONE-acetaminophen (PERCOCET/ROXICET) 5-325 MG per tablet 1 tablet (1 tablet Oral Given 05/16/23 2200)  potassium chloride SA (KLOR-CON M) CR tablet 40 mEq (40 mEq Oral Given 05/16/23 2317)    ED Course/ Medical Decision Making/ A&P                                  Medical Decision Making Amount and/or Complexity of Data Reviewed Labs: ordered. Radiology: ordered.  Risk Prescription drug management.   Patient with 9th and 10th rib fractures seen on x-ray.  He will be given Percocets and will follow-up with his PCP.  Patient also has hypokalemia.  He is given 40 mEq of potassium in the emergency department and a prescription for more potassium and he will follow-up with his PCP        Final Clinical Impression(s) / ED Diagnoses Final  diagnoses:  Fall, initial encounter  Open fracture of multiple ribs, unspecified laterality, sequela    Rx / DC Orders ED Discharge Orders          Ordered    oxyCODONE-acetaminophen (PERCOCET/ROXICET) 5-325 MG tablet  Every 6 hours PRN        05/16/23 2214    potassium chloride SA (KLOR-CON M) 20 MEQ tablet  Daily        05/16/23 2302              Duran Ohern, MD 05/17/23 1128

## 2023-07-01 ENCOUNTER — Emergency Department (HOSPITAL_COMMUNITY)

## 2023-07-01 ENCOUNTER — Emergency Department (HOSPITAL_COMMUNITY)
Admission: EM | Admit: 2023-07-01 | Discharge: 2023-07-01 | Disposition: A | Discharge status: Home / Self Care | Attending: Emergency Medicine | Admitting: Emergency Medicine

## 2023-07-01 ENCOUNTER — Other Ambulatory Visit: Payer: Self-pay

## 2023-07-01 ENCOUNTER — Other Ambulatory Visit: Payer: Self-pay | Admitting: Urology

## 2023-07-01 ENCOUNTER — Encounter (HOSPITAL_COMMUNITY): Payer: Self-pay | Admitting: Emergency Medicine

## 2023-07-01 DIAGNOSIS — S79911A Unspecified injury of right hip, initial encounter: Secondary | ICD-10-CM | POA: Diagnosis present

## 2023-07-01 DIAGNOSIS — G20C Parkinsonism, unspecified: Secondary | ICD-10-CM | POA: Insufficient documentation

## 2023-07-01 DIAGNOSIS — R7989 Other specified abnormal findings of blood chemistry: Secondary | ICD-10-CM | POA: Diagnosis not present

## 2023-07-01 DIAGNOSIS — R339 Retention of urine, unspecified: Secondary | ICD-10-CM | POA: Insufficient documentation

## 2023-07-01 DIAGNOSIS — W19XXXA Unspecified fall, initial encounter: Secondary | ICD-10-CM | POA: Insufficient documentation

## 2023-07-01 DIAGNOSIS — R55 Syncope and collapse: Secondary | ICD-10-CM | POA: Insufficient documentation

## 2023-07-01 LAB — CBC WITH DIFFERENTIAL/PLATELET
Abs Immature Granulocytes: 0.04 10*3/uL (ref 0.00–0.07)
Basophils Absolute: 0 10*3/uL (ref 0.0–0.1)
Basophils Relative: 0 %
Eosinophils Absolute: 0 10*3/uL (ref 0.0–0.5)
Eosinophils Relative: 0 %
HCT: 38.1 % — ABNORMAL LOW (ref 39.0–52.0)
Hemoglobin: 12.3 g/dL — ABNORMAL LOW (ref 13.0–17.0)
Immature Granulocytes: 0 %
Lymphocytes Relative: 11 %
Lymphs Abs: 1 10*3/uL (ref 0.7–4.0)
MCH: 31.8 pg (ref 26.0–34.0)
MCHC: 32.3 g/dL (ref 30.0–36.0)
MCV: 98.4 fL (ref 80.0–100.0)
Monocytes Absolute: 0.5 10*3/uL (ref 0.1–1.0)
Monocytes Relative: 6 %
Neutro Abs: 7.3 10*3/uL (ref 1.7–7.7)
Neutrophils Relative %: 83 %
Platelets: 282 10*3/uL (ref 150–400)
RBC: 3.87 MIL/uL — ABNORMAL LOW (ref 4.22–5.81)
RDW: 12.6 % (ref 11.5–15.5)
WBC: 8.9 10*3/uL (ref 4.0–10.5)
nRBC: 0 % (ref 0.0–0.2)

## 2023-07-01 LAB — COMPREHENSIVE METABOLIC PANEL WITH GFR
ALT: <5 U/L (ref 0–44)
AST: 14 U/L — ABNORMAL LOW (ref 15–41)
Albumin: 3.7 g/dL (ref 3.5–5.0)
Alkaline Phosphatase: 57 U/L (ref 38–126)
Anion gap: 8 (ref 5–15)
BUN: 27 mg/dL — ABNORMAL HIGH (ref 8–23)
CO2: 25 mmol/L (ref 22–32)
Calcium: 9.3 mg/dL (ref 8.9–10.3)
Chloride: 106 mmol/L (ref 98–111)
Creatinine, Ser: 1.27 mg/dL — ABNORMAL HIGH (ref 0.61–1.24)
GFR, Estimated: 57 mL/min — ABNORMAL LOW (ref >60)
Glucose, Bld: 103 mg/dL — ABNORMAL HIGH (ref 70–99)
Potassium: 4.1 mmol/L (ref 3.5–5.1)
Sodium: 139 mmol/L (ref 135–145)
Total Bilirubin: 1.5 mg/dL — ABNORMAL HIGH (ref 0.0–1.2)
Total Protein: 6.4 g/dL — ABNORMAL LOW (ref 6.5–8.1)

## 2023-07-01 LAB — URINALYSIS, ROUTINE W REFLEX MICROSCOPIC
Bacteria, UA: NONE SEEN
Bilirubin Urine: NEGATIVE
Glucose, UA: 50 mg/dL — AB
Ketones, ur: 5 mg/dL — AB
Nitrite: NEGATIVE
Protein, ur: 30 mg/dL — AB
Specific Gravity, Urine: 1.015 (ref 1.005–1.030)
pH: 6 (ref 5.0–8.0)

## 2023-07-01 NOTE — Discharge Instructions (Signed)
 Your laboratory cells are within normal limits today, you were evaluated by urology who will likely have your procedure scheduled soon.  If experience any worsening pain, worsening symptoms please return to the emergency department.

## 2023-07-01 NOTE — ED Triage Notes (Signed)
 Pt comes in pov he did fall this morning right hip pain  Patient is passing very little urine, urology is aware patient is having problems urinating, currently has kidney stone. Patient has already talked to urology about surgery   Nothing to eat or drink since night.

## 2023-07-01 NOTE — ED Provider Notes (Signed)
 [place] EMERGENCY DEPARTMENT AT [facility] Provider Note   CSN: [id number] Arrival date & time: 07/01/23  5102     History  Chief Complaint  Patient presents with   Hip Injury   Dysuria    [name] [name] is a 78 y.o. male.  78 y.o male with a PMH of Parkinson, BPH presents to the ED via POV with a chief complaint of dysuria along with status post fall.  According to wife is providing most of the history patient was told by urology to be seen in the emergency department today due to worsening urinary retention, reports he has not peed in several hours, only had a small amount of urine around 8 PM last night.  Reports that they were heading up the door, patient had a syncopal episode, this is recurrent for patient as patient "has low blood pressure ".  Patient is endorsing pain along the right hip, especially around the buttocks area.  He was not given any medication for pain control.  He did strike his head on the posterior aspect, currently not on any blood thinners.  Denies any loss of consciousness, chest pain, shortness of breath, hematuria.  The history is provided by the patient and the spouse.  Dysuria Presenting symptoms: dysuria   Associated symptoms: no abdominal pain, no fever, no nausea and no vomiting        Home Medications Prior to Admission medications   Medication Sig Start Date End Date Taking? Authorizing Provider  ALPRAZolam  (XANAX ) 0.25 MG tablet Take 0.5 mg by mouth 2 (two) times daily as needed for anxiety.    [provider]  calcium  carbonate (TUMS - DOSED IN MG ELEMENTAL CALCIUM ) 500 MG chewable tablet Chew 1 tablet by mouth daily as needed for indigestion or heartburn.    [provider]  carbidopa -levodopa  (SINEMET  CR) 50-200 MG tablet Take 1 tablet by mouth at bedtime. 11/27/20   [provider]  carbidopa -levodopa  (SINEMET  IR) 25-100 MG tablet 2-2.5 tablets 4 times per day at 7am/10:30am/2pm/5:30pm 04/06/23   Tat,  Von Grumbling, DO  fludrocortisone  (FLORINEF ) 0.1 MG tablet TAKE 2 TABLETS (0.2 MG TOTAL) BY MOUTH DAILY. Patient taking differently: Take 0.1 mg by mouth 3 (three) times daily. 11/24/19   Avanell Leigh, MD  oxyCODONE -acetaminophen  (PERCOCET/ROXICET) 5-325 MG tablet Take 1 tablet by mouth every 6 (six) hours as needed for severe pain (pain score 7-10). 05/16/23   Cheyenne Cotta, MD  potassium chloride  SA (KLOR-CON  M) 20 MEQ tablet Take 1 tablet (20 mEq total) by mouth daily. 05/16/23   Zammit, Joseph, MD  selegiline (ELDEPRYL) 5 MG capsule Take 5 mg by mouth 2 (two) times daily before a meal.    [provider]  sulfamethoxazole -trimethoprim  (BACTRIM  DS) 800-160 MG tablet Take 1 tablet by mouth every 12 (twelve) hours. 09/13/20   Andrez Banker, MD  triamcinolone cream (KENALOG) 0.1 % Apply 1 application topically daily as needed (eczema).    [provider]      Allergies    Rivastigmine tartrate, Tamsulosin, and Alfuzosin     Review of Systems   Review of Systems  Constitutional:  Negative for chills and fever.  Respiratory:  Negative for shortness of breath.   Cardiovascular:  Negative for chest pain.  Gastrointestinal:  Negative for abdominal pain, nausea and vomiting.  Genitourinary:  Positive for dysuria.  Musculoskeletal:  Positive for arthralgias.  Neurological:  Negative for light-headedness and headaches.  All other systems reviewed and are negative.  Physical Exam Updated Vital Signs BP (!) 158/103   Pulse 79   Temp 98.6 F (37 C)   Resp 19   Ht 5\' 10"  (1.778 m)   Wt 74.8 kg   SpO2 99%   BMI 23.66 kg/m  Physical Exam Vitals and nursing note reviewed.  Constitutional:      Appearance: Normal appearance.  HENT:     Head: Normocephalic.     Comments: Small goose egg noted, no bleeding noted.     Mouth/Throat:     Mouth: Mucous membranes are moist.  Cardiovascular:     Rate and Rhythm: Normal rate.  Pulmonary:     Effort: Pulmonary effort is  normal.     Breath sounds: No wheezing or rales.  Abdominal:     General: Abdomen is flat.     Palpations: Abdomen is soft.     Tenderness: There is no abdominal tenderness. There is no right CVA tenderness or left CVA tenderness.  Musculoskeletal:     Cervical back: Normal range of motion and neck supple.     Lumbar back: Normal.     Right hip: Tenderness present.     Comments: TTP along the right buttocks, no bony tenderness.   Skin:    General: Skin is warm and dry.  Neurological:     Mental Status: He is alert and oriented to person, place, and time.     Comments: Smile symmetric.  Moves upper and lower extremities.     ED Results / Procedures / Treatments   Labs (all labs ordered are listed, but only abnormal results are displayed) Labs Reviewed  CBC WITH DIFFERENTIAL/PLATELET - Abnormal; Notable for the following components:      Result Value   RBC 3.87 (*)    Hemoglobin 12.3 (*)    HCT 38.1 (*)    All other components within normal limits  COMPREHENSIVE METABOLIC PANEL WITH GFR - Abnormal; Notable for the following components:   Glucose, Bld 103 (*)    BUN 27 (*)    Creatinine, Ser 1.27 (*)    Total Protein 6.4 (*)    AST 14 (*)    Total Bilirubin 1.5 (*)    GFR, Estimated 57 (*)    All other components within normal limits  URINALYSIS, ROUTINE W REFLEX MICROSCOPIC - Abnormal; Notable for the following components:   APPearance HAZY (*)    Glucose, UA 50 (*)    Hgb urine dipstick SMALL (*)    Ketones, ur 5 (*)    Protein, ur 30 (*)    Leukocytes,Ua SMALL (*)    All other components within normal limits  URINE CULTURE    EKG None  Radiology DG Hip Unilat W or Wo Pelvis 2-3 Views Right Result Date: 07/01/2023 CLINICAL DATA:  Right hip pain after fall. EXAM: DG HIP (WITH OR WITHOUT PELVIS) 2-3V RIGHT COMPARISON:  None Available. FINDINGS: No evidence of acute fracture of the pelvis or right hip. No hip dislocation. Pubic rami are intact. Pubic symphysis and  sacroiliac joints are congruent. Mild bilateral degenerative spurring of the acetabula. IMPRESSION: No acute fracture or subluxation of the pelvis or right hip. Electronically Signed   By: Chadwick Colonel M.D.   On: 07/01/2023 11:17   CT HEAD WO CONTRAST ( ) Result Date: 07/01/2023 CLINICAL DATA:  Head trauma, minor, normal mental status. Neck trauma, midline tenderness. Fall. EXAM: CT HEAD WITHOUT CONTRAST CT CERVICAL SPINE WITHOUT CONTRAST TECHNIQUE: Multidetector CT imaging of the head and cervical  spine was performed following the standard protocol without intravenous contrast. Multiplanar CT image reconstructions of the cervical spine were also generated. RADIATION DOSE REDUCTION: This exam was performed according to the departmental dose-optimization program which includes automated exposure control, adjustment of the mA and/or kV according to patient size and/or use of iterative reconstruction technique. COMPARISON:  Head CT 12/15/2012 FINDINGS: CT HEAD FINDINGS Brain: There is no evidence of an acute infarct, intracranial hemorrhage, mass, midline shift, or extra-axial fluid collection. The brain stimulators terminate in the subglenoid regions bilaterally, with the right-sided stimulator being new from the remote prior CT. Mild cerebral atrophy is within normal limits for age. Cerebral white matter hypodensities are nonspecific but compatible with mild chronic small vessel ischemic disease. Vascular: No hyperdense vessel or unexpected calcification. Skull: No acute fracture or suspicious lesion. Sinuses/Orbits: Visualized paranasal sinuses and mastoid air cells are clear. Bilateral cataract extraction. Other: None. CT CERVICAL SPINE FINDINGS Alignment: Normal. Skull base and vertebrae: No acute fracture or suspicious lesion. Soft tissues and spinal canal: No prevertebral fluid or swelling. No visible canal hematoma. Disc levels: Mild cervical spondylosis. Moderate facet arthrosis on the right at C2-3  and on the left at C3-4. Moderate left neural foraminal stenosis at C3-4. No evidence of high-grade spinal canal stenosis. Upper chest: The included lung apices are clear. Other: None. IMPRESSION: 1. No evidence of acute intracranial abnormality or cervical spine fracture. 2. Mild chronic small vessel ischemic disease. Electronically Signed   By: Aundra Lee M.D.   On: 07/01/2023 11:08   CT Cervical Spine Wo Contrast Result Date: 07/01/2023 CLINICAL DATA:  Head trauma, minor, normal mental status. Neck trauma, midline tenderness. Fall. EXAM: CT HEAD WITHOUT CONTRAST CT CERVICAL SPINE WITHOUT CONTRAST TECHNIQUE: Multidetector CT imaging of the head and cervical spine was performed following the standard protocol without intravenous contrast. Multiplanar CT image reconstructions of the cervical spine were also generated. RADIATION DOSE REDUCTION: This exam was performed according to the departmental dose-optimization program which includes automated exposure control, adjustment of the mA and/or kV according to patient size and/or use of iterative reconstruction technique. COMPARISON:  Head CT 12/15/2012 FINDINGS: CT HEAD FINDINGS Brain: There is no evidence of an acute infarct, intracranial hemorrhage, mass, midline shift, or extra-axial fluid collection. The brain stimulators terminate in the subglenoid regions bilaterally, with the right-sided stimulator being new from the remote prior CT. Mild cerebral atrophy is within normal limits for age. Cerebral white matter hypodensities are nonspecific but compatible with mild chronic small vessel ischemic disease. Vascular: No hyperdense vessel or unexpected calcification. Skull: No acute fracture or suspicious lesion. Sinuses/Orbits: Visualized paranasal sinuses and mastoid air cells are clear. Bilateral cataract extraction. Other: None. CT CERVICAL SPINE FINDINGS Alignment: Normal. Skull base and vertebrae: No acute fracture or suspicious lesion. Soft tissues and  spinal canal: No prevertebral fluid or swelling. No visible canal hematoma. Disc levels: Mild cervical spondylosis. Moderate facet arthrosis on the right at C2-3 and on the left at C3-4. Moderate left neural foraminal stenosis at C3-4. No evidence of high-grade spinal canal stenosis. Upper chest: The included lung apices are clear. Other: None. IMPRESSION: 1. No evidence of acute intracranial abnormality or cervical spine fracture. 2. Mild chronic small vessel ischemic disease. Electronically Signed   By: Aundra Lee M.D.   On: 07/01/2023 11:08    Procedures Procedures    Medications Ordered in ED Medications - No data to display  ED Course/ Medical Decision Making/ A&P Clinical Course as of 07/01/23 1417  Thu  Jul 01, 2023  1118 This is a 5 male presenting to the ED with concern for urinary retention, reportedly has a large left-sided renal stone that was planned for lithotripsy or surgical intervention this month with urology.  His wife reports he has not urinated or made very little urine in the past 12 hours.  He is denying any worsening abdominal pain.  He has Parkinson's and unsteady on his feet, and apparently tripped coming out of the house this morning and landed on his right hip.  He also struck his head.  He is having some right hip pain on exam but is able to fully range his right hip with no visible deformity.  No significant suprapubic discomfort.  Patient is pending labs, bladder scan, trauma CT imaging and x-ray [MT]  [address] [place]): SMALL [JS]    Clinical Course User Index [JS] Vishruth Seoane, PA-C [MT] Gordon Latus Janalyn Me, MD                                 Medical Decision Making Amount and/or Complexity of Data Reviewed Labs: ordered. Decision-making details documented in ED Course. Radiology: ordered.    This patient presents to the ED for concern of syncope, this involves a number of treatment options, and is a complaint that carries with it a high risk of  complications and morbidity.  The differential diagnosis includes mechanical versus syncope versus worsening kidney function.    Co morbidities: Discussed in HPI   Brief History:  See HPI.   EMR reviewed including pt PMHx, past surgical history and past visits to ER.   See HPI for more details   Lab Tests:  I ordered and independently interpreted labs.  The pertinent results include:    Labs notable for creatinine trending upwards with a level of 1.27, LFTs are within normal limits.  Electrolytes are within normal limits.  CBC with no leukocytosis, hemoglobin is within his baseline.   Imaging Studies:  NAD. I personally reviewed all imaging studies and no acute abnormality found. I agree with radiology interpretation.  Medicines ordered:  N/A   Consults:  I requested consultation with Urology,  and discussed lab and imaging findings as well as pertinent plan - they recommend: Evaluation of patient while in the emergency department.  Reevaluation:  After the interventions noted above I re-evaluated patient and found that they have :stayed the same  Social Determinants of Health:  The patient's social determinants of health were a factor in the care of this patient  Problem List / ED Course:  Patient scented to the ED brought in by wife status post fall.  According to the wife they were getting ready to come to the ER in order to be seen by urology as they called the office when suddenly patient fell.  She reports he syncopized this time, has a prior history of recurrent hypotension with syncopal episodes.  She did call EMS who informed her they did not have any ambulances available in order to provide transport.  Patient reports he has had some urinary retention has been ongoing for the past several days.  Reports he only had only small amount of urine, last night, reports he is having a hard time emptying his bladder.  Bladder scanner here showed about 50 cc of urine  present after he voided about 20 cc.  His UA does show small leukocytes but no nitrites or white blood cell  count to suggest infection. Interpretation of his blood work reveals CBC that unremarkable without any leukocytosis.  Hemoglobin is stable.  CMP with elevated creatinine at 1.2, from his prior levels.  According to wife at the bedside, patient is supposed to have lithotripsy by [facility] which is what brought them into the emergency department in the first place. I was told by wife at the bedside he had a similar complaint in the past in 2024 with prior history of kidney stones, she is under the impression that Dr. Dulcy Gibney will be intervening today.  Call was placed to [facility] in order to obtain further recommendations. Patient was evaluated by urology, they will be having his procedure done in the next couple of weeks but it will not be today.  They are aware of this and they are agreeable with outpatient plan.  He is agreeable with plan and treatment, return precautions discussed at length.  Patient stable for discharge.   Dispostion:  After consideration of the diagnostic results and the patients response to treatment, I feel that the patent would benefit from follow up with urology.     Portions of this note were generated with Scientist, clinical (histocompatibility and immunogenetics). Dictation errors may occur despite best attempts at proofreading.   Final Clinical Impression(s) / ED Diagnoses Final diagnoses:  Fall, initial encounter  Urinary retention    Rx / DC Orders ED Discharge Orders     None         Luellen Sages, PA-C 07/01/23 1417    Arvilla Birmingham, MD 07/01/23 [phone]

## 2023-07-02 LAB — URINE CULTURE: Culture: <10000 — AB

## 2023-07-05 ENCOUNTER — Inpatient Hospital Stay (HOSPITAL_COMMUNITY)
Admission: EM | Admit: 2023-07-05 | Discharge: 2023-07-10 | DRG: 071 | Disposition: A | Discharge status: Home Health | Attending: Internal Medicine | Admitting: Internal Medicine

## 2023-07-05 ENCOUNTER — Other Ambulatory Visit: Payer: Self-pay

## 2023-07-05 ENCOUNTER — Emergency Department (HOSPITAL_COMMUNITY)

## 2023-07-05 DIAGNOSIS — Z974 Presence of external hearing-aid: Secondary | ICD-10-CM

## 2023-07-05 DIAGNOSIS — G9340 Encephalopathy, unspecified: Secondary | ICD-10-CM | POA: Diagnosis present

## 2023-07-05 DIAGNOSIS — W19XXXA Unspecified fall, initial encounter: Secondary | ICD-10-CM | POA: Diagnosis present

## 2023-07-05 DIAGNOSIS — D649 Anemia, unspecified: Secondary | ICD-10-CM | POA: Insufficient documentation

## 2023-07-05 DIAGNOSIS — I951 Orthostatic hypotension: Secondary | ICD-10-CM | POA: Diagnosis present

## 2023-07-05 DIAGNOSIS — R4182 Altered mental status, unspecified: Principal | ICD-10-CM

## 2023-07-05 DIAGNOSIS — N1330 Unspecified hydronephrosis: Secondary | ICD-10-CM | POA: Diagnosis present

## 2023-07-05 DIAGNOSIS — Z9079 Acquired absence of other genital organ(s): Secondary | ICD-10-CM

## 2023-07-05 DIAGNOSIS — N390 Urinary tract infection, site not specified: Secondary | ICD-10-CM | POA: Insufficient documentation

## 2023-07-05 DIAGNOSIS — R296 Repeated falls: Secondary | ICD-10-CM | POA: Diagnosis present

## 2023-07-05 DIAGNOSIS — N200 Calculus of kidney: Secondary | ICD-10-CM

## 2023-07-05 DIAGNOSIS — Z7952 Long term (current) use of systemic steroids: Secondary | ICD-10-CM

## 2023-07-05 DIAGNOSIS — G9341 Metabolic encephalopathy: Secondary | ICD-10-CM | POA: Diagnosis not present

## 2023-07-05 DIAGNOSIS — Z8249 Family history of ischemic heart disease and other diseases of the circulatory system: Secondary | ICD-10-CM

## 2023-07-05 DIAGNOSIS — R498 Other voice and resonance disorders: Secondary | ICD-10-CM | POA: Insufficient documentation

## 2023-07-05 DIAGNOSIS — M545 Low back pain, unspecified: Secondary | ICD-10-CM | POA: Diagnosis present

## 2023-07-05 DIAGNOSIS — G20A1 Parkinson's disease without dyskinesia, without mention of fluctuations: Secondary | ICD-10-CM | POA: Diagnosis present

## 2023-07-05 DIAGNOSIS — N400 Enlarged prostate without lower urinary tract symptoms: Secondary | ICD-10-CM | POA: Diagnosis present

## 2023-07-05 DIAGNOSIS — F419 Anxiety disorder, unspecified: Secondary | ICD-10-CM | POA: Diagnosis present

## 2023-07-05 DIAGNOSIS — Z888 Allergy status to other drugs, medicaments and biological substances status: Secondary | ICD-10-CM

## 2023-07-05 DIAGNOSIS — E860 Dehydration: Secondary | ICD-10-CM | POA: Diagnosis present

## 2023-07-05 DIAGNOSIS — M25551 Pain in right hip: Secondary | ICD-10-CM | POA: Diagnosis present

## 2023-07-05 DIAGNOSIS — K5900 Constipation, unspecified: Secondary | ICD-10-CM | POA: Diagnosis present

## 2023-07-05 DIAGNOSIS — F32A Depression, unspecified: Secondary | ICD-10-CM | POA: Diagnosis present

## 2023-07-05 DIAGNOSIS — K219 Gastro-esophageal reflux disease without esophagitis: Secondary | ICD-10-CM | POA: Diagnosis present

## 2023-07-05 DIAGNOSIS — N3000 Acute cystitis without hematuria: Secondary | ICD-10-CM

## 2023-07-05 LAB — URINALYSIS, ROUTINE W REFLEX MICROSCOPIC
Bilirubin Urine: NEGATIVE
Glucose, UA: 50 mg/dL — AB
Ketones, ur: 5 mg/dL — AB
Nitrite: NEGATIVE
Protein, ur: 30 mg/dL — AB
Specific Gravity, Urine: 1.019 (ref 1.005–1.030)
pH: 5 (ref 5.0–8.0)

## 2023-07-05 LAB — COMPREHENSIVE METABOLIC PANEL WITH GFR
ALT: 7 U/L (ref 0–44)
AST: 13 U/L — ABNORMAL LOW (ref 15–41)
Albumin: 3.6 g/dL (ref 3.5–5.0)
Alkaline Phosphatase: 58 U/L (ref 38–126)
Anion gap: 7 (ref 5–15)
BUN: 41 mg/dL — ABNORMAL HIGH (ref 8–23)
CO2: 24 mmol/L (ref 22–32)
Calcium: 9.7 mg/dL (ref 8.9–10.3)
Chloride: 104 mmol/L (ref 98–111)
Creatinine, Ser: 1.14 mg/dL (ref 0.61–1.24)
GFR, Estimated: >60 mL/min (ref >60)
Glucose, Bld: 110 mg/dL — ABNORMAL HIGH (ref 70–99)
Potassium: 4.1 mmol/L (ref 3.5–5.1)
Sodium: 135 mmol/L (ref 135–145)
Total Bilirubin: 0.8 mg/dL (ref 0.0–1.2)
Total Protein: 6.6 g/dL (ref 6.5–8.1)

## 2023-07-05 LAB — CBC
HCT: 38.9 % — ABNORMAL LOW (ref 39.0–52.0)
Hemoglobin: 12.9 g/dL — ABNORMAL LOW (ref 13.0–17.0)
MCH: 31.9 pg (ref 26.0–34.0)
MCHC: 33.2 g/dL (ref 30.0–36.0)
MCV: 96.3 fL (ref 80.0–100.0)
Platelets: 301 10*3/uL (ref 150–400)
RBC: 4.04 MIL/uL — ABNORMAL LOW (ref 4.22–5.81)
RDW: 12.2 % (ref 11.5–15.5)
WBC: 8.6 10*3/uL (ref 4.0–10.5)
nRBC: 0 % (ref 0.0–0.2)

## 2023-07-05 LAB — TROPONIN I (HIGH SENSITIVITY): Troponin I (High Sensitivity): 8 ng/L (ref <18)

## 2023-07-05 MED ORDER — SODIUM CHLORIDE 0.9 % IV SOLN
1.0000 g | Freq: Once | INTRAVENOUS | Status: AC
  Administered 2023-07-06: 1 g via INTRAVENOUS
  Filled 2023-07-05: qty 10

## 2023-07-05 MED ORDER — SODIUM CHLORIDE 0.9 % IV SOLN: INTRAVENOUS | Status: DC

## 2023-07-05 NOTE — ED Triage Notes (Signed)
 Family report patient increase confusion for past few days. Patient report unable to follow commands at home. Primary care recommended to come visit ED for possible UTI. Patient denies N/V.  Patient c/o right hip pain from previous fall 3 days ago.

## 2023-07-05 NOTE — ED Provider Triage Note (Addendum)
 Emergency Medicine Provider Triage Evaluation Note  [name] [name] , a 78 y.o. male  was evaluated in triage.  Pt complains of right hip, lumbar back pain following fall. Hx of parkinson  [name] [name] last night - was going using walker to go outside when he passed out for a minute. Witnessed by wife. Fell onto bottom on top of metal chair hammock. Complains of right hip pain  Wife reports increased confusion over past few days by unable to follow commands, difficulty finding things at home.   Review of Systems  Positive: See hpi Negative: thinners  Physical Exam  BP 101/69 (BP Location: Left Arm)   Pulse 79   Temp 98 F (36.7 C) (Oral)   Resp 17   SpO2 97%  Gen:   Awake, no distress   Resp:  Normal effort  MSK:   Moves extremities without difficulty  Other:  Baseline nonverbal from parkinson  Medical Decision Making  Medically screening exam initiated at 8:05 PM.  Appropriate orders placed.  [name] [name] was informed that the remainder of the evaluation will be completed by another provider, this initial triage assessment does not replace that evaluation, and the importance of remaining in the ED until their evaluation is complete.  Labs and imaging ordered   [name] Cords, PA 07/05/23 2012    [name] [name], [place] 07/05/23 2013

## 2023-07-05 NOTE — ED Provider Notes (Signed)
 [place] EMERGENCY DEPARTMENT AT [facility] Provider Note   CSN: [id number] Arrival date & time: 07/05/23  1839     History  Chief Complaint  Patient presents with   Altered Mental Status    [name] [name] is a 78 y.o. male.  The history is provided by the patient and the spouse.  Altered Mental Status Presenting symptoms: confusion   Severity:  Moderate Most recent episode:  2 days ago Timing:  Constant Progression:  Unchanged Chronicity:  Recurrent Context: not homeless, not nursing home resident and not recent change in medication   Associated symptoms: no agitation, no fever, no rash and no vomiting   Patient with Parkinson's Disease and Orthostatic hypotension presents with AMS.      Past Medical History:  Diagnosis Date   Agent orange exposure    BPH (benign prostatic hypertrophy)    Cancer (HCC)    basal cell temple   Constipation    Depression    Fatigue    d/t Parkinson's   GERD (gastroesophageal reflux disease)    History of kidney stones    Left ureteral calculus    Lower urinary tract symptoms (LUTS)    Nephrolithiasis    Neuromuscular disorder (HCC)     parkinson's   Orthostatic hypotension    d/t Parkinson's disease   Orthostatic hypotension    Parkinson's disease (HCC)    Peyronie disease    Pneumonia 2018   Renal cyst, left    S/P deep brain stimulator placement    11-29-2012   Sleep behavior disorder, REM    Wears glasses    Wears hearing aid    BILATERAL-- INTERMITTANT WEARS     Home Medications Prior to Admission medications   Medication Sig Start Date End Date Taking? Authorizing Provider  ondansetron  (ZOFRAN ) 4 MG tablet Take 4 mg by mouth every 8 (eight) hours as needed. 05/26/23  Yes [provider]  ALPRAZolam  (XANAX ) 0.25 MG tablet Take 0.5 mg by mouth 2 (two) times daily as needed for anxiety.    [provider]  calcium  carbonate (TUMS - DOSED IN MG ELEMENTAL CALCIUM ) 500 MG chewable tablet  Chew 1 tablet by mouth daily as needed for indigestion or heartburn.    [provider]  carbidopa -levodopa  (SINEMET  CR) 50-200 MG tablet Take 1 tablet by mouth at bedtime. 11/27/20   [provider]  carbidopa -levodopa  (SINEMET  IR) 25-100 MG tablet 2-2.5 tablets 4 times per day at 7am/10:30am/2pm/5:30pm 04/06/23   Tat, Von Grumbling, DO  fludrocortisone  (FLORINEF ) 0.1 MG tablet TAKE 2 TABLETS (0.2 MG TOTAL) BY MOUTH DAILY. Patient taking differently: Take 0.1 mg by mouth 3 (three) times daily. 11/24/19   Avanell Leigh, MD  oxyCODONE -acetaminophen  (PERCOCET/ROXICET) 5-325 MG tablet Take 1 tablet by mouth every 6 (six) hours as needed for severe pain (pain score 7-10). 05/16/23   Cheyenne Cotta, MD  potassium chloride  SA (KLOR-CON  M) 20 MEQ tablet Take 1 tablet (20 mEq total) by mouth daily. 05/16/23   Zammit, Joseph, MD  selegiline (ELDEPRYL) 5 MG capsule Take 5 mg by mouth 2 (two) times daily before a meal.    [provider]  sulfamethoxazole -trimethoprim  (BACTRIM  DS) 800-160 MG tablet Take 1 tablet by mouth every 12 (twelve) hours. 09/13/20   Andrez Banker, MD  triamcinolone cream (KENALOG) 0.1 % Apply 1 application topically daily as needed (eczema).    [provider]      Allergies    Rivastigmine tartrate, Tamsulosin, and Alfuzosin   Review of Systems   Review of Systems  Constitutional:  Negative for fever.  Gastrointestinal:  Negative for vomiting.  Skin:  Negative for rash.  Psychiatric/Behavioral:  Positive for confusion. Negative for agitation.     Physical Exam Updated Vital Signs BP 131/82   Pulse 68   Temp 98 F (36.7 C) (Oral)   Resp 18   SpO2 99%  Physical Exam Vitals and nursing note reviewed.  Constitutional:      General: He is not in acute distress.    Appearance: He is well-developed. He is not diaphoretic.  HENT:     Head: Normocephalic and atraumatic.     Nose: Nose normal.  Eyes:     Conjunctiva/sclera:  Conjunctivae normal.     Pupils: Pupils are equal, round, and reactive to light.  Cardiovascular:     Rate and Rhythm: Normal rate and regular rhythm.     Pulses: Normal pulses.     Heart sounds: Normal heart sounds.  Pulmonary:     Effort: Pulmonary effort is normal.     Breath sounds: Normal breath sounds. No wheezing or rales.  Abdominal:     General: Bowel sounds are normal.     Palpations: Abdomen is soft.     Tenderness: There is no abdominal tenderness. There is no guarding or rebound.  Musculoskeletal:        General: Normal range of motion.     Cervical back: Normal range of motion and neck supple.  Skin:    General: Skin is warm and dry.     Capillary Refill: Capillary refill takes less than 2 seconds.  Neurological:     Mental Status: He is alert.     Deep Tendon Reflexes: Reflexes normal.     ED Results / Procedures / Treatments   Labs (all labs ordered are listed, but only abnormal results are displayed) Results for orders placed or performed during the hospital encounter of 07/05/23  Comprehensive metabolic panel   Collection Time: 07/05/23  7:35 PM  Result Value Ref Range   Sodium 135 135 - 145 mmol/L   Potassium 4.1 3.5 - 5.1 mmol/L   Chloride 104 98 - 111 mmol/L   CO2 24 22 - 32 mmol/L   Glucose, Bld 110 (H) 70 - 99 mg/dL   BUN 41 (H) 8 - 23 mg/dL   Creatinine, Ser 8.11 0.61 - 1.24 mg/dL   Calcium  9.7 8.9 - 10.3 mg/dL   Total Protein 6.6 6.5 - 8.1 g/dL   Albumin 3.6 3.5 - 5.0 g/dL   AST 13 (L) 15 - 41 U/L   ALT 7 0 - 44 U/L   Alkaline Phosphatase 58 38 - 126 U/L   Total Bilirubin 0.8 0.0 - 1.2 mg/dL   GFR, Estimated >91 >47 mL/min   Anion gap 7 5 - 15  CBC   Collection Time: 07/05/23  7:35 PM  Result Value Ref Range   WBC 8.6 4.0 - 10.5 K/uL   RBC 4.04 (L) 4.22 - 5.81 MIL/uL   Hemoglobin 12.9 (L) 13.0 - 17.0 g/dL   HCT 82.9 (L) 56.2 - 13.0 %   MCV 96.3 80.0 - 100.0 fL   MCH 31.9 26.0 - 34.0 pg   MCHC 33.2 30.0 - 36.0 g/dL   RDW 86.5 78.4 -  69.6 %   Platelets 301 150 - 400 K/uL   nRBC 0.0 0.0 - 0.2 %  Troponin I (High Sensitivity)   Collection Time: 07/05/23  8:13 PM  Result Value Ref Range   Troponin I (High Sensitivity) 8 <18 ng/L  Urinalysis, Routine w reflex microscopic -Urine, Clean Catch   Collection Time: 07/05/23  8:50 PM  Result Value Ref Range   Color, Urine YELLOW YELLOW   APPearance HAZY (A) CLEAR   Specific Gravity, Urine 1.019 1.005 - 1.030   pH 5.0 5.0 - 8.0   Glucose, UA 50 (A) NEGATIVE mg/dL   Hgb urine dipstick SMALL (A) NEGATIVE   Bilirubin Urine NEGATIVE NEGATIVE   Ketones, ur 5 (A) NEGATIVE mg/dL   Protein, ur 30 (A) NEGATIVE mg/dL   Nitrite NEGATIVE NEGATIVE   Leukocytes,Ua MODERATE (A) NEGATIVE   RBC / HPF 6-10 0 - 5 RBC/hpf   WBC, UA 21-50 0 - 5 WBC/hpf   Bacteria, UA RARE (A) NONE SEEN   Squamous Epithelial / HPF 0-5 0 - 5 /HPF   Mucus PRESENT    Hyaline Casts, UA PRESENT    Ca Oxalate Crys, UA PRESENT    CT Lumbar Spine Wo Contrast Result Date: 07/05/2023 CLINICAL DATA:  Back trauma, no prior imaging (Age >= 16y) EXAM: CT LUMBAR SPINE WITHOUT CONTRAST TECHNIQUE: Multidetector CT imaging of the lumbar spine was performed without intravenous contrast administration. Multiplanar CT image reconstructions were also generated. RADIATION DOSE REDUCTION: This exam was performed according to the departmental dose-optimization program which includes automated exposure control, adjustment of the mA and/or kV according to patient size and/or use of iterative reconstruction technique. COMPARISON:  None Available. 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 Rudimentary disc space noted at L5-S1. Alignment: Normal. Vertebrae: No acute fracture or focal pathologic process. Paraspinal and other soft tissues: Negative. Disc levels: Intervertebral disc heights are preserved. Diffuse mild endplate remodeling is seen throughout the lumbar spine in keeping with changes of mild  diffuse degenerative disc disease. The pedicles are congenitally shortened throughout the visualized thoracolumbar spine contributing to findings noted below. Axial images demonstrate: T12-L1: Moderate bilateral neuroforaminal narrowing no significant canal stenosis. L1-2: Mild right and moderate left neuroforaminal narrowing. Broad-based disc bulge in combination with facet hypertrophy results in severe central canal stenosis within the sub foraminal zone. Mild bilateral facet arthrosis. Narrowing of the lateral recesses and probable impingement of the crossing L2 nerve roots bilaterally. L2-3: Mild to moderate bilateral neuroforaminal narrowing. Broad-based disc bulge in combination with facet hypertrophy results in severe central canal stenosis within the sub foraminal zone. Mild bilateral facet arthrosis. Narrowing of the lateral recess bilaterally noted with possible impingement of the crossing L3 nerve roots. L3-4: Mild bilateral neuroforaminal narrowing. Broad-based disc bulge in combination facet hypertrophy results in mild-to-moderate central stenosis within the sub foraminal zone. Mild facet arthrosis. No significant impingement of the recesses. L4-5: Severe left and moderate to severe right neuroforaminal narrowing probable abutment of the exiting left L4 nerve root. Broad-based disc bulge in combination with facet hypertrophy results in moderate central canal stenosis within the sub foraminal zone. Impingement of lateral recesses bilaterally may result impingement the crossing L5 nerve roots bilaterally. Moderate bilateral facet arthrosis. L5-S1 unremarkable IMPRESSION: 1. 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 Rudimentary disc space noted at L5-S1. 2. Multilevel degenerative changes of the lumbar spine as described. Of note, there is severe central canal stenosis at L1-2 and L2-3 with probable impingement of the crossing L2, L3 and L5 nerve roots bilaterally. 3. Multilevel neuroforaminal  narrowing as above. 4. Of note, there is severe left and moderate to severe right neuroforaminal narrowing at L4-5 with probable  abutment of the exiting left L4 nerve root. Electronically Signed   By: Worthy Heads M.D.   On: 07/05/2023 22:16   CT Head Wo Contrast Result Date: 07/05/2023 CLINICAL DATA:  Increased confusion EXAM: CT HEAD WITHOUT CONTRAST CT CERVICAL SPINE WITHOUT CONTRAST TECHNIQUE: Multidetector CT imaging of the head and cervical spine was performed following the standard protocol without intravenous contrast. Multiplanar CT image reconstructions of the cervical spine were also generated. RADIATION DOSE REDUCTION: This exam was performed according to the departmental dose-optimization program which includes automated exposure control, adjustment of the mA and/or kV according to patient size and/or use of iterative reconstruction technique. COMPARISON:  07/01/2023 FINDINGS: CT HEAD FINDINGS Brain: No evidence of acute infarction, hemorrhage, hydrocephalus, extra-axial collection or mass lesion/mass effect. Bilateral deep brain stimulator leads in generally expected vicinity of the subthalamic nucleus. Periventricular white matter hypodensity. Vascular: No hyperdense vessel or unexpected calcification. Skull: Normal. Negative for fracture or focal lesion. Sinuses/Orbits: No acute finding. Other: None. CT CERVICAL SPINE FINDINGS Alignment: Normal. Skull base and vertebrae: No acute fracture. No primary bone lesion or focal pathologic process. Soft tissues and spinal canal: No prevertebral fluid or swelling. No visible canal hematoma. Disc levels: Mild disc degenerative change of the lower cervical levels. Upper chest: Negative. Other: None. IMPRESSION: 1. No acute intracranial pathology. Small-vessel white matter disease. 2. Bilateral deep brain stimulator leads. 3. No fracture or static subluxation of the cervical spine. Electronically Signed   By: Fredricka Jenny M.D.   On: 07/05/2023 21:59   CT  Cervical Spine Wo Contrast Result Date: 07/05/2023 CLINICAL DATA:  Increased confusion EXAM: CT HEAD WITHOUT CONTRAST CT CERVICAL SPINE WITHOUT CONTRAST TECHNIQUE: Multidetector CT imaging of the head and cervical spine was performed following the standard protocol without intravenous contrast. Multiplanar CT image reconstructions of the cervical spine were also generated. RADIATION DOSE REDUCTION: This exam was performed according to the departmental dose-optimization program which includes automated exposure control, adjustment of the mA and/or kV according to patient size and/or use of iterative reconstruction technique. COMPARISON:  07/01/2023 FINDINGS: CT HEAD FINDINGS Brain: No evidence of acute infarction, hemorrhage, hydrocephalus, extra-axial collection or mass lesion/mass effect. Bilateral deep brain stimulator leads in generally expected vicinity of the subthalamic nucleus. Periventricular white matter hypodensity. Vascular: No hyperdense vessel or unexpected calcification. Skull: Normal. Negative for fracture or focal lesion. Sinuses/Orbits: No acute finding. Other: None. CT CERVICAL SPINE FINDINGS Alignment: Normal. Skull base and vertebrae: No acute fracture. No primary bone lesion or focal pathologic process. Soft tissues and spinal canal: No prevertebral fluid or swelling. No visible canal hematoma. Disc levels: Mild disc degenerative change of the lower cervical levels. Upper chest: Negative. Other: None. IMPRESSION: 1. No acute intracranial pathology. Small-vessel white matter disease. 2. Bilateral deep brain stimulator leads. 3. No fracture or static subluxation of the cervical spine. Electronically Signed   By: Fredricka Jenny M.D.   On: 07/05/2023 21:59   DG Pelvis Portable Result Date: 07/05/2023 CLINICAL DATA:  Right hip pain after falls. EXAM: PORTABLE PELVIS 1-2 VIEWS COMPARISON:  Pelvis and right hip radiograph 07/01/2023 FINDINGS: No acute fracture of the pelvis or right hip. No evidence  of healing fracture. No hip dislocation. Mild osteoarthritis unchanged from recent exam. Pubic rami are intact. Pubic symphysis and sacroiliac joints are congruent. Pelvic phleboliths and surgical clips. IMPRESSION: 1. No acute fracture of the pelvis or right hip. 2. Mild osteoarthritis. Electronically Signed   By: Chadwick Colonel M.D.   On: 07/05/2023 20:51  DG Hip Unilat W or Wo Pelvis 2-3 Views Right Result Date: 07/01/2023 CLINICAL DATA:  Right hip pain after fall. EXAM: DG HIP (WITH OR WITHOUT PELVIS) 2-3V RIGHT COMPARISON:  None Available. FINDINGS: No evidence of acute fracture of the pelvis or right hip. No hip dislocation. Pubic rami are intact. Pubic symphysis and sacroiliac joints are congruent. Mild bilateral degenerative spurring of the acetabula. IMPRESSION: No acute fracture or subluxation of the pelvis or right hip. Electronically Signed   By: Chadwick Colonel M.D.   On: 07/01/2023 11:17   CT HEAD WO CONTRAST ( ) Result Date: 07/01/2023 CLINICAL DATA:  Head trauma, minor, normal mental status. Neck trauma, midline tenderness. Fall. EXAM: CT HEAD WITHOUT CONTRAST CT CERVICAL SPINE WITHOUT CONTRAST TECHNIQUE: Multidetector CT imaging of the head and cervical spine was performed following the standard protocol without intravenous contrast. Multiplanar CT image reconstructions of the cervical spine were also generated. RADIATION DOSE REDUCTION: This exam was performed according to the departmental dose-optimization program which includes automated exposure control, adjustment of the mA and/or kV according to patient size and/or use of iterative reconstruction technique. COMPARISON:  Head CT 12/15/2012 FINDINGS: CT HEAD FINDINGS Brain: There is no evidence of an acute infarct, intracranial hemorrhage, mass, midline shift, or extra-axial fluid collection. The brain stimulators terminate in the subglenoid regions bilaterally, with the right-sided stimulator being new from the remote prior CT. Mild  cerebral atrophy is within normal limits for age. Cerebral white matter hypodensities are nonspecific but compatible with mild chronic small vessel ischemic disease. Vascular: No hyperdense vessel or unexpected calcification. Skull: No acute fracture or suspicious lesion. Sinuses/Orbits: Visualized paranasal sinuses and mastoid air cells are clear. Bilateral cataract extraction. Other: None. CT CERVICAL SPINE FINDINGS Alignment: Normal. Skull base and vertebrae: No acute fracture or suspicious lesion. Soft tissues and spinal canal: No prevertebral fluid or swelling. No visible canal hematoma. Disc levels: Mild cervical spondylosis. Moderate facet arthrosis on the right at C2-3 and on the left at C3-4. Moderate left neural foraminal stenosis at C3-4. No evidence of high-grade spinal canal stenosis. Upper chest: The included lung apices are clear. Other: None. IMPRESSION: 1. No evidence of acute intracranial abnormality or cervical spine fracture. 2. Mild chronic small vessel ischemic disease. Electronically Signed   By: Aundra Lee M.D.   On: 07/01/2023 11:08   CT Cervical Spine Wo Contrast Result Date: 07/01/2023 CLINICAL DATA:  Head trauma, minor, normal mental status. Neck trauma, midline tenderness. Fall. EXAM: CT HEAD WITHOUT CONTRAST CT CERVICAL SPINE WITHOUT CONTRAST TECHNIQUE: Multidetector CT imaging of the head and cervical spine was performed following the standard protocol without intravenous contrast. Multiplanar CT image reconstructions of the cervical spine were also generated. RADIATION DOSE REDUCTION: This exam was performed according to the departmental dose-optimization program which includes automated exposure control, adjustment of the mA and/or kV according to patient size and/or use of iterative reconstruction technique. COMPARISON:  Head CT 12/15/2012 FINDINGS: CT HEAD FINDINGS Brain: There is no evidence of an acute infarct, intracranial hemorrhage, mass, midline shift, or extra-axial  fluid collection. The brain stimulators terminate in the subglenoid regions bilaterally, with the right-sided stimulator being new from the remote prior CT. Mild cerebral atrophy is within normal limits for age. Cerebral white matter hypodensities are nonspecific but compatible with mild chronic small vessel ischemic disease. Vascular: No hyperdense vessel or unexpected calcification. Skull: No acute fracture or suspicious lesion. Sinuses/Orbits: Visualized paranasal sinuses and mastoid air cells are clear. Bilateral cataract extraction. Other: None. CT CERVICAL SPINE  FINDINGS Alignment: Normal. Skull base and vertebrae: No acute fracture or suspicious lesion. Soft tissues and spinal canal: No prevertebral fluid or swelling. No visible canal hematoma. Disc levels: Mild cervical spondylosis. Moderate facet arthrosis on the right at C2-3 and on the left at C3-4. Moderate left neural foraminal stenosis at C3-4. No evidence of high-grade spinal canal stenosis. Upper chest: The included lung apices are clear. Other: None. IMPRESSION: 1. No evidence of acute intracranial abnormality or cervical spine fracture. 2. Mild chronic small vessel ischemic disease. Electronically Signed   By: Aundra Lee M.D.   On: 07/01/2023 11:08     Radiology CT Lumbar Spine Wo Contrast Result Date: 07/05/2023 CLINICAL DATA:  Back trauma, no prior imaging (Age >= 16y) EXAM: CT LUMBAR SPINE WITHOUT CONTRAST TECHNIQUE: Multidetector CT imaging of the lumbar spine was performed without intravenous contrast administration. Multiplanar CT image reconstructions were also generated. RADIATION DOSE REDUCTION: This exam was performed according to the departmental dose-optimization program which includes automated exposure control, adjustment of the mA and/or kV according to patient size and/or use of iterative reconstruction technique. COMPARISON:  None Available. FINDINGS: Segmentation: There is transitional lumbar anatomy with sacralization of  L5. Rudimentary disc space noted at L5-S1. Alignment: Normal. Vertebrae: No acute fracture or focal pathologic process. Paraspinal and other soft tissues: Negative. Disc levels: Intervertebral disc heights are preserved. Diffuse mild endplate remodeling is seen throughout the lumbar spine in keeping with changes of mild diffuse degenerative disc disease. The pedicles are congenitally shortened throughout the visualized thoracolumbar spine contributing to findings noted below. Axial images demonstrate: T12-L1: Moderate bilateral neuroforaminal narrowing no significant canal stenosis. L1-2: Mild right and moderate left neuroforaminal narrowing. Broad-based disc bulge in combination with facet hypertrophy results in severe central canal stenosis within the sub foraminal zone. Mild bilateral facet arthrosis. Narrowing of the lateral recesses and probable impingement of the crossing L2 nerve roots bilaterally. L2-3: Mild to moderate bilateral neuroforaminal narrowing. Broad-based disc bulge in combination with facet hypertrophy results in severe central canal stenosis within the sub foraminal zone. Mild bilateral facet arthrosis. Narrowing of the lateral recess bilaterally noted with possible impingement of the crossing L3 nerve roots. L3-4: Mild bilateral neuroforaminal narrowing. Broad-based disc bulge in combination facet hypertrophy results in mild-to-moderate central stenosis within the sub foraminal zone. Mild facet arthrosis. No significant impingement of the recesses. L4-5: Severe left and moderate to severe right neuroforaminal narrowing probable abutment of the exiting left L4 nerve root. Broad-based disc bulge in combination with facet hypertrophy results in moderate central canal stenosis within the sub foraminal zone. Impingement of lateral recesses bilaterally may result impingement the crossing L5 nerve roots bilaterally. Moderate bilateral facet arthrosis. L5-S1 unremarkable IMPRESSION: 1. Transitional  lumbar anatomy with sacralization of L5. Rudimentary disc space noted at L5-S1. 2. Multilevel degenerative changes of the lumbar spine as described. Of note, there is severe central canal stenosis at L1-2 and L2-3 with probable impingement of the crossing L2, L3 and L5 nerve roots bilaterally. 3. Multilevel neuroforaminal narrowing as above. 4. Of note, there is severe left and moderate to severe right neuroforaminal narrowing at L4-5 with probable abutment of the exiting left L4 nerve root. Electronically Signed   By: Worthy Heads M.D.   On: 07/05/2023 22:16   CT Head Wo Contrast Result Date: 07/05/2023 CLINICAL DATA:  Increased confusion EXAM: CT HEAD WITHOUT CONTRAST CT CERVICAL SPINE WITHOUT CONTRAST TECHNIQUE: Multidetector CT imaging of the head and cervical spine was performed following the standard protocol without intravenous  contrast. Multiplanar CT image reconstructions of the cervical spine were also generated. RADIATION DOSE REDUCTION: This exam was performed according to the departmental dose-optimization program which includes automated exposure control, adjustment of the mA and/or kV according to patient size and/or use of iterative reconstruction technique. COMPARISON:  07/01/2023 FINDINGS: CT HEAD FINDINGS Brain: No evidence of acute infarction, hemorrhage, hydrocephalus, extra-axial collection or mass lesion/mass effect. Bilateral deep brain stimulator leads in generally expected vicinity of the subthalamic nucleus. Periventricular white matter hypodensity. Vascular: No hyperdense vessel or unexpected calcification. Skull: Normal. Negative for fracture or focal lesion. Sinuses/Orbits: No acute finding. Other: None. CT CERVICAL SPINE FINDINGS Alignment: Normal. Skull base and vertebrae: No acute fracture. No primary bone lesion or focal pathologic process. Soft tissues and spinal canal: No prevertebral fluid or swelling. No visible canal hematoma. Disc levels: Mild disc degenerative change of  the lower cervical levels. Upper chest: Negative. Other: None. IMPRESSION: 1. No acute intracranial pathology. Small-vessel white matter disease. 2. Bilateral deep brain stimulator leads. 3. No fracture or static subluxation of the cervical spine. Electronically Signed   By: Fredricka Jenny M.D.   On: 07/05/2023 21:59   CT Cervical Spine Wo Contrast Result Date: 07/05/2023 CLINICAL DATA:  Increased confusion EXAM: CT HEAD WITHOUT CONTRAST CT CERVICAL SPINE WITHOUT CONTRAST TECHNIQUE: Multidetector CT imaging of the head and cervical spine was performed following the standard protocol without intravenous contrast. Multiplanar CT image reconstructions of the cervical spine were also generated. RADIATION DOSE REDUCTION: This exam was performed according to the departmental dose-optimization program which includes automated exposure control, adjustment of the mA and/or kV according to patient size and/or use of iterative reconstruction technique. COMPARISON:  07/01/2023 FINDINGS: CT HEAD FINDINGS Brain: No evidence of acute infarction, hemorrhage, hydrocephalus, extra-axial collection or mass lesion/mass effect. Bilateral deep brain stimulator leads in generally expected vicinity of the subthalamic nucleus. Periventricular white matter hypodensity. Vascular: No hyperdense vessel or unexpected calcification. Skull: Normal. Negative for fracture or focal lesion. Sinuses/Orbits: No acute finding. Other: None. CT CERVICAL SPINE FINDINGS Alignment: Normal. Skull base and vertebrae: No acute fracture. No primary bone lesion or focal pathologic process. Soft tissues and spinal canal: No prevertebral fluid or swelling. No visible canal hematoma. Disc levels: Mild disc degenerative change of the lower cervical levels. Upper chest: Negative. Other: None. IMPRESSION: 1. No acute intracranial pathology. Small-vessel white matter disease. 2. Bilateral deep brain stimulator leads. 3. No fracture or static subluxation of the cervical  spine. Electronically Signed   By: Fredricka Jenny M.D.   On: 07/05/2023 21:59   DG Pelvis Portable Result Date: 07/05/2023 CLINICAL DATA:  Right hip pain after falls. EXAM: PORTABLE PELVIS 1-2 VIEWS COMPARISON:  Pelvis and right hip radiograph 07/01/2023 FINDINGS: No acute fracture of the pelvis or right hip. No evidence of healing fracture. No hip dislocation. Mild osteoarthritis unchanged from recent exam. Pubic rami are intact. Pubic symphysis and sacroiliac joints are congruent. Pelvic phleboliths and surgical clips. IMPRESSION: 1. No acute fracture of the pelvis or right hip. 2. Mild osteoarthritis. Electronically Signed   By: Chadwick Colonel M.D.   On: 07/05/2023 20:51    Procedures Procedures    Medications Ordered in ED Medications  cefTRIAXone  (ROCEPHIN ) 1 g in sodium chloride  0.9 % 100 mL IVPB (has no administration in time range)  0.9 %  sodium chloride  infusion (has no administration in time range)    EKG Interpretation Date/Time:  Monday July 05 2023 20:25:51 EDT Ventricular Rate:  83 PR Interval:  127  QRS Duration:  98 QT Interval:  378 QTC Calculation: 445 R Axis:   49  Text Interpretation: Sinus rhythm Right atrial enlargement Abnormal R-wave progression, early transition Artifact in lead(s) I II III aVR aVL V1 V2 V6 Confirmed by Maralee Senate, Merline Perkin ([id number]) on 07/06/2023 12:19:47 AM        ED Course/ Medical Decision Making/ A&P                                 Medical Decision Making Patient with AMS and falls at home  Amount and/or Complexity of Data Reviewed External Data Reviewed: notes.    Details: Previous notes reviewed  Labs: ordered.    Details: Normal sodium 135, normal potassium 4.1, normal creatinine 1.14, normal troponin 8, urine positive for UTI Radiology: ordered and independent interpretation performed.    Details: Negative head CT ECG/medicine tests: ordered and independent interpretation performed. Decision-making details documented in ED  Course.  Risk Prescription drug management. Decision regarding hospitalization.    Final Clinical Impression(s) / ED Diagnoses Final diagnoses:  Altered mental status, unspecified altered mental status type  Acute cystitis without hematuria   The patient appears reasonably stabilized for admission considering the current resources, flow, and capabilities available in the ED at this time, and I doubt any other [facility] requiring further screening and/or treatment in the ED prior to admission.  Rx / DC Orders ED Discharge Orders     None         Aahna Rossa, MD 07/06/23 0020

## 2023-07-06 ENCOUNTER — Observation Stay (HOSPITAL_COMMUNITY)

## 2023-07-06 ENCOUNTER — Encounter (HOSPITAL_COMMUNITY): Payer: Self-pay | Admitting: Internal Medicine

## 2023-07-06 DIAGNOSIS — W19XXXA Unspecified fall, initial encounter: Secondary | ICD-10-CM | POA: Diagnosis present

## 2023-07-06 DIAGNOSIS — G9340 Encephalopathy, unspecified: Secondary | ICD-10-CM | POA: Diagnosis not present

## 2023-07-06 DIAGNOSIS — Z8249 Family history of ischemic heart disease and other diseases of the circulatory system: Secondary | ICD-10-CM | POA: Diagnosis not present

## 2023-07-06 DIAGNOSIS — Z974 Presence of external hearing-aid: Secondary | ICD-10-CM | POA: Diagnosis not present

## 2023-07-06 DIAGNOSIS — G9341 Metabolic encephalopathy: Secondary | ICD-10-CM | POA: Diagnosis present

## 2023-07-06 DIAGNOSIS — D649 Anemia, unspecified: Secondary | ICD-10-CM | POA: Insufficient documentation

## 2023-07-06 DIAGNOSIS — R296 Repeated falls: Secondary | ICD-10-CM | POA: Diagnosis present

## 2023-07-06 DIAGNOSIS — M25551 Pain in right hip: Secondary | ICD-10-CM | POA: Diagnosis present

## 2023-07-06 DIAGNOSIS — G20A1 Parkinson's disease without dyskinesia, without mention of fluctuations: Secondary | ICD-10-CM | POA: Diagnosis present

## 2023-07-06 DIAGNOSIS — N390 Urinary tract infection, site not specified: Secondary | ICD-10-CM | POA: Insufficient documentation

## 2023-07-06 DIAGNOSIS — K219 Gastro-esophageal reflux disease without esophagitis: Secondary | ICD-10-CM | POA: Diagnosis present

## 2023-07-06 DIAGNOSIS — F419 Anxiety disorder, unspecified: Secondary | ICD-10-CM | POA: Diagnosis present

## 2023-07-06 DIAGNOSIS — Z9079 Acquired absence of other genital organ(s): Secondary | ICD-10-CM | POA: Diagnosis not present

## 2023-07-06 DIAGNOSIS — N400 Enlarged prostate without lower urinary tract symptoms: Secondary | ICD-10-CM | POA: Diagnosis present

## 2023-07-06 DIAGNOSIS — N1330 Unspecified hydronephrosis: Secondary | ICD-10-CM | POA: Diagnosis present

## 2023-07-06 DIAGNOSIS — N3000 Acute cystitis without hematuria: Secondary | ICD-10-CM

## 2023-07-06 DIAGNOSIS — N200 Calculus of kidney: Secondary | ICD-10-CM | POA: Diagnosis present

## 2023-07-06 DIAGNOSIS — Z888 Allergy status to other drugs, medicaments and biological substances status: Secondary | ICD-10-CM | POA: Diagnosis not present

## 2023-07-06 DIAGNOSIS — E860 Dehydration: Secondary | ICD-10-CM | POA: Diagnosis present

## 2023-07-06 DIAGNOSIS — I951 Orthostatic hypotension: Secondary | ICD-10-CM

## 2023-07-06 DIAGNOSIS — Z7952 Long term (current) use of systemic steroids: Secondary | ICD-10-CM | POA: Diagnosis not present

## 2023-07-06 DIAGNOSIS — F32A Depression, unspecified: Secondary | ICD-10-CM | POA: Diagnosis present

## 2023-07-06 DIAGNOSIS — M545 Low back pain, unspecified: Secondary | ICD-10-CM | POA: Diagnosis present

## 2023-07-06 DIAGNOSIS — K5900 Constipation, unspecified: Secondary | ICD-10-CM | POA: Diagnosis present

## 2023-07-06 LAB — CBC WITH DIFFERENTIAL/PLATELET
Abs Immature Granulocytes: 0.02 10*3/uL (ref 0.00–0.07)
Basophils Absolute: 0 10*3/uL (ref 0.0–0.1)
Basophils Relative: 1 %
Eosinophils Absolute: 0.1 10*3/uL (ref 0.0–0.5)
Eosinophils Relative: 2 %
HCT: 36.3 % — ABNORMAL LOW (ref 39.0–52.0)
Hemoglobin: 12 g/dL — ABNORMAL LOW (ref 13.0–17.0)
Immature Granulocytes: 0 %
Lymphocytes Relative: 24 %
Lymphs Abs: 1.4 10*3/uL (ref 0.7–4.0)
MCH: 32.2 pg (ref 26.0–34.0)
MCHC: 33.1 g/dL (ref 30.0–36.0)
MCV: 97.3 fL (ref 80.0–100.0)
Monocytes Absolute: 0.5 10*3/uL (ref 0.1–1.0)
Monocytes Relative: 9 %
Neutro Abs: 3.7 10*3/uL (ref 1.7–7.7)
Neutrophils Relative %: 64 %
Platelets: 263 10*3/uL (ref 150–400)
RBC: 3.73 MIL/uL — ABNORMAL LOW (ref 4.22–5.81)
RDW: 12.4 % (ref 11.5–15.5)
WBC: 5.8 10*3/uL (ref 4.0–10.5)
nRBC: 0 % (ref 0.0–0.2)

## 2023-07-06 LAB — RETICULOCYTES
Immature Retic Fract: 3 % (ref 2.3–15.9)
RBC.: 3.78 MIL/uL — ABNORMAL LOW (ref 4.22–5.81)
Retic Count, Absolute: 35.5 10*3/uL (ref 19.0–186.0)
Retic Ct Pct: 0.9 % (ref 0.4–3.1)

## 2023-07-06 LAB — HEPATIC FUNCTION PANEL
ALT: <5 U/L (ref 0–44)
AST: 11 U/L — ABNORMAL LOW (ref 15–41)
Albumin: 3.3 g/dL — ABNORMAL LOW (ref 3.5–5.0)
Alkaline Phosphatase: 53 U/L (ref 38–126)
Bilirubin, Direct: 0.1 mg/dL (ref 0.0–0.2)
Indirect Bilirubin: 0.6 mg/dL (ref 0.3–0.9)
Total Bilirubin: 0.7 mg/dL (ref 0.0–1.2)
Total Protein: 6.2 g/dL — ABNORMAL LOW (ref 6.5–8.1)

## 2023-07-06 LAB — FOLATE: Folate: 16.7 ng/mL (ref >5.9)

## 2023-07-06 LAB — RPR: RPR Ser Ql: NONREACTIVE

## 2023-07-06 LAB — AMMONIA: Ammonia: 18 umol/L (ref 9–35)

## 2023-07-06 LAB — BASIC METABOLIC PANEL WITH GFR
Anion gap: 6 (ref 5–15)
BUN: 39 mg/dL — ABNORMAL HIGH (ref 8–23)
CO2: 26 mmol/L (ref 22–32)
Calcium: 9.1 mg/dL (ref 8.9–10.3)
Chloride: 103 mmol/L (ref 98–111)
Creatinine, Ser: 0.96 mg/dL (ref 0.61–1.24)
GFR, Estimated: >60 mL/min (ref >60)
Glucose, Bld: 116 mg/dL — ABNORMAL HIGH (ref 70–99)
Potassium: 3.5 mmol/L (ref 3.5–5.1)
Sodium: 135 mmol/L (ref 135–145)

## 2023-07-06 LAB — FERRITIN: Ferritin: 80 ng/mL (ref 24–336)

## 2023-07-06 LAB — IRON AND TIBC
Iron: 45 ug/dL (ref 45–182)
Saturation Ratios: 17 % — ABNORMAL LOW (ref 17.9–39.5)
TIBC: 273 ug/dL (ref 250–450)
UIBC: 228 ug/dL

## 2023-07-06 LAB — HIV ANTIBODY (ROUTINE TESTING W REFLEX): HIV Screen 4th Generation wRfx: NONREACTIVE

## 2023-07-06 LAB — TROPONIN I (HIGH SENSITIVITY): Troponin I (High Sensitivity): 6 ng/L (ref <18)

## 2023-07-06 LAB — TSH: TSH: 1.902 u[IU]/mL (ref 0.350–4.500)

## 2023-07-06 LAB — VITAMIN B12: Vitamin B-12: 176 pg/mL — ABNORMAL LOW (ref 180–914)

## 2023-07-06 MED ORDER — ACETAMINOPHEN 650 MG RE SUPP: 650.0000 mg | Freq: Four times a day (QID) | RECTAL | Status: DC | PRN

## 2023-07-06 MED ORDER — CARBIDOPA-LEVODOPA 25-100 MG PO TABS
2.0000 | ORAL_TABLET | Freq: Four times a day (QID) | ORAL | Status: DC
  Administered 2023-07-06 – 2023-07-10 (×19): 2 via ORAL
  Filled 2023-07-06 (×19): qty 2

## 2023-07-06 MED ORDER — POTASSIUM CHLORIDE CRYS ER 20 MEQ PO TBCR
20.0000 meq | EXTENDED_RELEASE_TABLET | Freq: Every day | ORAL | Status: DC
  Administered 2023-07-06 – 2023-07-10 (×5): 20 meq via ORAL
  Filled 2023-07-06 (×5): qty 1

## 2023-07-06 MED ORDER — POLYETHYLENE GLYCOL 3350 17 G PO PACK
17.0000 g | PACK | Freq: Every day | ORAL | Status: DC
  Administered 2023-07-07 – 2023-07-10 (×4): 17 g via ORAL
  Filled 2023-07-06 (×4): qty 1

## 2023-07-06 MED ORDER — FLUDROCORTISONE ACETATE 0.1 MG PO TABS
0.1000 mg | ORAL_TABLET | Freq: Two times a day (BID) | ORAL | Status: DC
  Administered 2023-07-06 – 2023-07-07 (×2): 0.1 mg via ORAL
  Filled 2023-07-06 (×4): qty 1

## 2023-07-06 MED ORDER — DOCUSATE SODIUM 100 MG PO CAPS
100.0000 mg | ORAL_CAPSULE | Freq: Two times a day (BID) | ORAL | Status: DC
  Administered 2023-07-06 – 2023-07-10 (×9): 100 mg via ORAL
  Filled 2023-07-06 (×9): qty 1

## 2023-07-06 MED ORDER — POLYVINYL ALCOHOL 1.4 % OP SOLN: 1.0000 [drp] | OPHTHALMIC | Status: DC | PRN

## 2023-07-06 MED ORDER — ACETAMINOPHEN 325 MG PO TABS
650.0000 mg | ORAL_TABLET | Freq: Four times a day (QID) | ORAL | Status: DC | PRN
  Administered 2023-07-07 – 2023-07-10 (×3): 650 mg via ORAL
  Filled 2023-07-06 (×3): qty 2

## 2023-07-06 MED ORDER — BISACODYL 5 MG PO TBEC
5.0000 mg | DELAYED_RELEASE_TABLET | Freq: Every day | ORAL | Status: DC | PRN
  Administered 2023-07-07: 5 mg via ORAL
  Filled 2023-07-06: qty 1

## 2023-07-06 MED ORDER — ENOXAPARIN SODIUM 40 MG/0.4ML IJ SOSY
40.0000 mg | PREFILLED_SYRINGE | INTRAMUSCULAR | Status: DC
  Administered 2023-07-06 – 2023-07-10 (×5): 40 mg via SUBCUTANEOUS
  Filled 2023-07-06 (×5): qty 0.4

## 2023-07-06 MED ORDER — CARBIDOPA-LEVODOPA ER 50-200 MG PO TBCR
1.0000 | EXTENDED_RELEASE_TABLET | Freq: Every day | ORAL | Status: DC
  Administered 2023-07-06 – 2023-07-09 (×4): 1 via ORAL
  Filled 2023-07-06 (×4): qty 1

## 2023-07-06 MED ORDER — SODIUM CHLORIDE 0.9 % IV SOLN
1.0000 g | INTRAVENOUS | Status: DC
  Administered 2023-07-06: 1 g via INTRAVENOUS
  Filled 2023-07-06: qty 10

## 2023-07-06 MED ORDER — LACTATED RINGERS IV SOLN: INTRAVENOUS | Status: AC

## 2023-07-06 NOTE — H&P (Addendum)
 History and Physical    [name] [name] MVH:[id number] DOB: Jan 13, 1946 DOA: 07/05/2023  Patient coming from: Home.  Chief Complaint: Confusion.  HPI: [name] [name] is a 78 y.o. male with history of Parkinson's disease, orthostatic hypotension was brought to the ER after patient was found to be increasingly confused over the last 2 days.  Patient was brought on 07/01/2023 after patient had a fall.  At the time patient missed his seating and fell onto his floor.  X-rays and scans were unremarkable UA was concerning for UTI at the time.  Cultures were negative.  Two days ago patient again had a fall fell onto his buttocks.  Did not lose consciousness but after this fall patient has been found to be more confused.  Not been eating well and was brought to the ER.  Patient's wife states that he falls because of his orthostatic hypotension.  ED Course: In the ER patient is afebrile.  UA is concerning for UTI.  CT head C-spine unremarkable x-rays did not show any pelvic fracture.  Patient was started on fluids and antibiotics for UTI and admitted for further observation.  Labs show hemoglobin of 12.4 troponins are negative creatinine 1.1.  Review of Systems: As per HPI, rest all negative.   Past Medical History:  Diagnosis Date   Agent orange exposure    BPH (benign prostatic hypertrophy)    Cancer (HCC)    basal cell temple   Constipation    Depression    Fatigue    d/t Parkinson's   GERD (gastroesophageal reflux disease)    History of kidney stones    Left ureteral calculus    Lower urinary tract symptoms (LUTS)    Nephrolithiasis    Neuromuscular disorder (HCC)     parkinson's   Orthostatic hypotension    d/t Parkinson's disease   Orthostatic hypotension    Parkinson's disease (HCC)    Peyronie disease    Pneumonia 2018   Renal cyst, left    S/P deep brain stimulator placement    11-29-2012   Sleep behavior disorder, REM    Wears glasses    Wears hearing aid    BILATERAL--  INTERMITTANT WEARS    Past Surgical History:  Procedure Laterality Date   CATARACT EXTRACTION Bilateral    CHOLECYSTECTOMY  12/21/2014   Procedure: LAPAROSCOPIC CHOLECYSTECTOMY;  Surgeon: Shela Derby, MD;  Location: [facility];  Service: General;;   COLONOSCOPY     CYSTO/  LEFT URETEROSOCPY/   LEFT URETERAL STENT PLACEMENT/  LASER BLADDER STONES AND EXTRACTION  09/25/2010   CYSTOSCOPY WITH INSERTION OF UROLIFT N/A 03/11/2017   Procedure: CYSTOSCOPY WITH INSERTION OF UROLIFT, LITHOPAXY,STONE OBTAINED;  Surgeon: Trent Frizzle, MD;  Location: [facility];  Service: Urology;  Laterality: N/A;   CYSTOSCOPY WITH LITHOLAPAXY N/A 09/12/2020   Procedure: CYSTOSCOPY WITH LITHOLAPAXY, LEFT RETROGRADE PYELOGRAMS, LEFT URETEROSCOPY, AND STENT PLACEMENT;  Surgeon: Andrez Banker, MD;  Location: [facility];  Service: Urology;  Laterality: N/A;  REQUESTING 2 HRS   CYSTOSCOPY WITH RETROGRADE PYELOGRAM, URETEROSCOPY AND STENT PLACEMENT Left 01/06/2013   Procedure: CYSTOSCOPY WITH RETROGRADE PYELOGRAM, URETEROSCOPY AND STENT PLACEMENT;  Surgeon: Jinny Mounts, MD;  Location: [facility];  Service: Urology;  Laterality: Left;   CYSTOSCOPY WITH RETROGRADE PYELOGRAM, URETEROSCOPY AND STENT PLACEMENT Left 09/11/2014   Procedure: CYSTOSCOPY WITH LEFT RETROGRADE PYELOGRAM, URETEROSCOPY AND STENT PLACEMENT;  Surgeon: Jock Muller, MD;  Location: [facility];  Service: Urology;  Laterality: Left;  DEEP BRAIN STIMULATOR PLACEMENT  11/29/2012   genertor device at left pectoral area-   EXTRACORPOREAL SHOCK WAVE LITHOTRIPSY Left 10-11-2010  &  01-05-2011   HOLMIUM LASER APPLICATION Left 01/06/2013   Procedure: HOLMIUM LASER APPLICATION;  Surgeon: Jinny Mounts, MD;  Location: [facility];  Service: Urology;  Laterality: Left;   HOLMIUM LASER APPLICATION Left 09/11/2014   Procedure: HOLMIUM LASER APPLICATION;  Surgeon: Jock Muller, MD;   Location: [facility];  Service: Urology;  Laterality: Left;   HOLMIUM LASER APPLICATION N/A 03/11/2017   Procedure: HOLMIUM LASER APPLICATION;  Surgeon: Trent Frizzle, MD;  Location: [facility];  Service: Urology;  Laterality: N/A;   NEGATIVE SLEEP STUDY  2013  per pt   NEPHROLITHOTOMY Right 08/13/2022   Procedure: RIGHT NEPHROLITHOTOMY PERCUTANEOUS;  Surgeon: Andrez Banker, MD;  Location: [facility];  Service: Urology;  Laterality: Right;  210 MINUTES   SP PERC NEPHROSTOMY Left 12/16/2012   STONE EXTRACTION WITH BASKET Left 01/06/2013   Procedure: STONE EXTRACTION WITH BASKET;  Surgeon: Jinny Mounts, MD;  Location: [facility];  Service: Urology;  Laterality: Left;   TONSILLECTOMY     TRANSURETHRAL RESECTION OF PROSTATE N/A 09/12/2020   Procedure: TRANSURETHRAL RESECTION OF THE PROSTATE (TURP);  Surgeon: Andrez Banker, MD;  Location: [facility];  Service: Urology;  Laterality: N/A;     reports that he has never smoked. He has never used smokeless tobacco. He reports current alcohol  use. He reports that he does not use drugs.  Allergies  Allergen Reactions   Rivastigmine Tartrate Nausea And Vomiting     Can tolerate the patch but can not tolerate the pills   Tamsulosin Other (See Comments)    Severe joint pain   Alfuzosin      Other reaction(s): Other (See Comments) hypotension    Family History  Problem Relation Age of Onset   Hypertension Mother    Dementia Father    Cancer Father    Heart attack Sister    Arthritis/Rheumatoid Sister     Prior to Admission medications   Medication Sig Start Date End Date Taking? Authorizing Provider  ALPRAZolam  (XANAX ) 0.25 MG tablet Take 0.5 mg by mouth 2 (two) times daily as needed for anxiety.   Yes [provider]  calcium  carbonate (TUMS - DOSED IN MG ELEMENTAL CALCIUM ) 500 MG chewable tablet Chew 1 tablet by mouth daily as needed for indigestion or  heartburn.   Yes [provider]  carbidopa -levodopa  (SINEMET  CR) 50-200 MG tablet Take 1 tablet by mouth at bedtime. 11/27/20  Yes [provider]  carbidopa -levodopa  (SINEMET  IR) 25-100 MG tablet 2-2.5 tablets 4 times per day at 7am/10:30am/2pm/5:30pm Patient taking differently: 2-2.5 tablets 4 times per day at 6am/10:30am/2pm/6pm 04/06/23  Yes Tat, Von Grumbling, DO  fludrocortisone  (FLORINEF ) 0.1 MG tablet TAKE 2 TABLETS (0.2 MG TOTAL) BY MOUTH DAILY. Patient taking differently: Take 0.1 mg by mouth 2 (two) times daily. 11/24/19  Yes Avanell Leigh, MD  potassium chloride  SA (KLOR-CON  M) 20 MEQ tablet Take 1 tablet (20 mEq total) by mouth daily. 05/16/23  Yes Cheyenne Cotta, MD  ondansetron  (ZOFRAN ) 4 MG tablet Take 4 mg by mouth every 8 (eight) hours as needed. Patient not taking: Reported on 07/06/2023 05/26/23   [provider]  oxyCODONE -acetaminophen  (PERCOCET/ROXICET) 5-325 MG tablet Take 1 tablet by mouth every 6 (six) hours as needed for severe pain (pain score 7-10). Patient not taking: Reported on 07/06/2023 05/16/23  Zammit, Joseph, MD  selegiline (ELDEPRYL) 5 MG capsule Take 5 mg by mouth 2 (two) times daily before a meal. Patient not taking: Reported on 07/06/2023    [provider]  sulfamethoxazole -trimethoprim  (BACTRIM  DS) 800-160 MG tablet Take 1 tablet by mouth every 12 (twelve) hours. Patient not taking: Reported on 07/06/2023 09/13/20   Andrez Banker, MD  triamcinolone cream (KENALOG) 0.1 % Apply 1 application topically daily as needed (eczema). Patient not taking: Reported on 07/06/2023    [provider]    Physical Exam: Constitutional: Moderately built and nourished. Vitals:   07/05/23 1848 07/05/23 2256 07/06/23 0236 07/06/23 0300  BP: 101/69 131/82  132/69  Pulse: 79 68  61  Resp: 17 18  16   Temp: 98 F (36.7 C) 98 F (36.7 C) 98.5 F (36.9 C)   TempSrc: Oral Oral    SpO2: 97% 99%  96%   Eyes: Anicteric no  pallor. ENMT: No discharge from the ears/nose or mouth. Neck: No mass felt.  No neck rigidity. Respiratory: No rhonchi or crepitations. Cardiovascular: S1 and S2 heard. Abdomen: Soft nontender bowel sound present. Musculoskeletal: No edema. Skin: No rash. Neurologic: Alert awake oriented to his name and place but very slow in talking.  Moving all extremity EXTR. Psychiatric: Slow in talking.   Labs on Admission: I have personally reviewed following labs and imaging studies  CBC: Recent Labs  Lab 07/01/23 1032 07/05/23 1935  WBC 8.9 8.6  NEUTROABS 7.3  --   HGB 12.3* 12.9*  HCT 38.1* 38.9*  MCV 98.4 96.3  PLT 282 301   Basic Metabolic Panel: Recent Labs  Lab 07/01/23 1032 07/05/23 1935  NA 139 135  K 4.1 4.1  CL 106 104  CO2 25 24  GLUCOSE 103* 110*  BUN 27* 41*  CREATININE 1.27* 1.14  CALCIUM  9.3 9.7   GFR: Estimated Creatinine Clearance: 54.3 mL/min (by C-G formula based on SCr of 1.14 mg/dL). Liver Function Tests: Recent Labs  Lab 07/01/23 1032 07/05/23 1935  AST 14* 13*  ALT <5 7  ALKPHOS 57 58  BILITOT 1.5* 0.8  PROT 6.4* 6.6  ALBUMIN 3.7 3.6   No results for input(s): "LIPASE", "AMYLASE" in the last 168 hours. No results for input(s): "AMMONIA" in the last 168 hours. Coagulation Profile: No results for input(s): "INR", "PROTIME" in the last 168 hours. Cardiac Enzymes: No results for input(s): "CKTOTAL", "CKMB", "CKMBINDEX", "TROPONINI" in the last 168 hours. BNP (last 3 results) No results for input(s): "PROBNP" in the last 8760 hours. HbA1C: No results for input(s): "HGBA1C" in the last 72 hours. CBG: No results for input(s): "GLUCAP" in the last 168 hours. Lipid Profile: No results for input(s): "CHOL", "HDL", "LDLCALC", "TRIG", "CHOLHDL", "LDLDIRECT" in the last 72 hours. Thyroid  Function Tests: No results for input(s): "TSH", "T4TOTAL", "FREET4", "T3FREE", "THYROIDAB" in the last 72 hours. Anemia Panel: No results for input(s):  "VITAMINB12", "FOLATE", "FERRITIN", "TIBC", "IRON", "RETICCTPCT" in the last 72 hours. Urine analysis:    Component Value Date/Time   COLORURINE YELLOW 07/05/2023 2050   APPEARANCEUR HAZY (A) 07/05/2023 2050   LABSPEC 1.019 07/05/2023 2050   PHURINE 5.0 07/05/2023 2050   GLUCOSEU 50 (A) 07/05/2023 2050   HGBUR SMALL (A) 07/05/2023 2050   BILIRUBINUR NEGATIVE 07/05/2023 2050   KETONESUR 5 (A) 07/05/2023 2050   PROTEINUR 30 (A) 07/05/2023 2050   UROBILINOGEN 0.2 12/15/2012 1201   NITRITE NEGATIVE 07/05/2023 2050   LEUKOCYTESUR MODERATE (A) 07/05/2023 2050   Sepsis Labs: @LABRCNTIP (procalcitonin:4,lacticidven:4) )  Recent Results (from the past 240 hours)  Urine Culture     Status: Abnormal   Collection Time: 07/01/23  9:58 AM   Specimen: Urine, Clean Catch  Result Value Ref Range Status   Specimen Description   Final    URINE, CLEAN CATCH Performed at [facility], [address] [place], [place] [address]    Special Requests   Final    NONE Performed at [facility], [address] [place], [place] [address]    Culture (A)  Final    <10,000 COLONIES/mL INSIGNIFICANT GROWTH Performed at [facility], [address], [place], [place] [address]    Report Status 07/02/2023 FINAL  Final     Radiological Exams on Admission: DG Abd 1 View Result Date: 07/06/2023 EXAM: 1 VIEW XRAY OF THE ABDOMEN SUPINE 07/06/2023 02:14:00 AM COMPARISON: None available. CLINICAL HISTORY: [id number] Constipation [id number]. Constipation Constipation FINDINGS: BOWEL: Moderate colonic stool burden suggesting mild constipation. PERITONEUM AND SOFT TISSUES: No abnormal calcifications. BONES: Mild degenerative changes of the lower lumbar spine. No acute osseous abnormality. IMPRESSION: 1. Moderate colonic stool burden, suggesting mild constipation. Electronically signed by: Zadie Herter MD 07/06/2023 02:31 AM EDT RP Workstation: [id number]   CT Lumbar Spine Wo  Contrast Result Date: 07/05/2023 CLINICAL DATA:  Back trauma, no prior imaging (Age >= 16y) EXAM: CT LUMBAR SPINE WITHOUT CONTRAST TECHNIQUE: Multidetector CT imaging of the lumbar spine was performed without intravenous contrast administration. Multiplanar CT image reconstructions were also generated. RADIATION DOSE REDUCTION: This exam was performed according to the departmental dose-optimization program which includes automated exposure control, adjustment of the mA and/or kV according to patient size and/or use of iterative reconstruction technique. COMPARISON:  None Available. 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 Rudimentary disc space noted at L5-S1. Alignment: Normal. Vertebrae: No acute fracture or focal pathologic process. Paraspinal and other soft tissues: Negative. Disc levels: Intervertebral disc heights are preserved. Diffuse mild endplate remodeling is seen throughout the lumbar spine in keeping with changes of mild diffuse degenerative disc disease. The pedicles are congenitally shortened throughout the visualized thoracolumbar spine contributing to findings noted below. Axial images demonstrate: T12-L1: Moderate bilateral neuroforaminal narrowing no significant canal stenosis. L1-2: Mild right and moderate left neuroforaminal narrowing. Broad-based disc bulge in combination with facet hypertrophy results in severe central canal stenosis within the sub foraminal zone. Mild bilateral facet arthrosis. Narrowing of the lateral recesses and probable impingement of the crossing L2 nerve roots bilaterally. L2-3: Mild to moderate bilateral neuroforaminal narrowing. Broad-based disc bulge in combination with facet hypertrophy results in severe central canal stenosis within the sub foraminal zone. Mild bilateral facet arthrosis. Narrowing of the lateral recess bilaterally noted with possible impingement of the crossing L3 nerve roots. L3-4: Mild bilateral  neuroforaminal narrowing. Broad-based disc bulge in combination facet hypertrophy results in mild-to-moderate central stenosis within the sub foraminal zone. Mild facet arthrosis. No significant impingement of the recesses. L4-5: Severe left and moderate to severe right neuroforaminal narrowing probable abutment of the exiting left L4 nerve root. Broad-based disc bulge in combination with facet hypertrophy results in moderate central canal stenosis within the sub foraminal zone. Impingement of lateral recesses bilaterally may result impingement the crossing L5 nerve roots bilaterally. Moderate bilateral facet arthrosis. L5-S1 unremarkable IMPRESSION: 1. 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 Rudimentary disc space noted at L5-S1. 2. Multilevel degenerative changes of the lumbar spine as described. Of note, there is severe central canal stenosis at  L1-2 and L2-3 with probable impingement of the crossing L2, L3 and L5 nerve roots bilaterally. 3. Multilevel neuroforaminal narrowing as above. 4. Of note, there is severe left and moderate to severe right neuroforaminal narrowing at L4-5 with probable abutment of the exiting left L4 nerve root. Electronically Signed   By: Worthy Heads M.D.   On: 07/05/2023 22:16   CT Head Wo Contrast Result Date: 07/05/2023 CLINICAL DATA:  Increased confusion EXAM: CT HEAD WITHOUT CONTRAST CT CERVICAL SPINE WITHOUT CONTRAST TECHNIQUE: Multidetector CT imaging of the head and cervical spine was performed following the standard protocol without intravenous contrast. Multiplanar CT image reconstructions of the cervical spine were also generated. RADIATION DOSE REDUCTION: This exam was performed according to the departmental dose-optimization program which includes automated exposure control, adjustment of the mA and/or kV according to patient size and/or use of iterative reconstruction technique. COMPARISON:  07/01/2023 FINDINGS: CT HEAD FINDINGS Brain: No evidence of  acute infarction, hemorrhage, hydrocephalus, extra-axial collection or mass lesion/mass effect. Bilateral deep brain stimulator leads in generally expected vicinity of the subthalamic nucleus. Periventricular white matter hypodensity. Vascular: No hyperdense vessel or unexpected calcification. Skull: Normal. Negative for fracture or focal lesion. Sinuses/Orbits: No acute finding. Other: None. CT CERVICAL SPINE FINDINGS Alignment: Normal. Skull base and vertebrae: No acute fracture. No primary bone lesion or focal pathologic process. Soft tissues and spinal canal: No prevertebral fluid or swelling. No visible canal hematoma. Disc levels: Mild disc degenerative change of the lower cervical levels. Upper chest: Negative. Other: None. IMPRESSION: 1. No acute intracranial pathology. Small-vessel white matter disease. 2. Bilateral deep brain stimulator leads. 3. No fracture or static subluxation of the cervical spine. Electronically Signed   By: Fredricka Jenny M.D.   On: 07/05/2023 21:59   CT Cervical Spine Wo Contrast Result Date: 07/05/2023 CLINICAL DATA:  Increased confusion EXAM: CT HEAD WITHOUT CONTRAST CT CERVICAL SPINE WITHOUT CONTRAST TECHNIQUE: Multidetector CT imaging of the head and cervical spine was performed following the standard protocol without intravenous contrast. Multiplanar CT image reconstructions of the cervical spine were also generated. RADIATION DOSE REDUCTION: This exam was performed according to the departmental dose-optimization program which includes automated exposure control, adjustment of the mA and/or kV according to patient size and/or use of iterative reconstruction technique. COMPARISON:  07/01/2023 FINDINGS: CT HEAD FINDINGS Brain: No evidence of acute infarction, hemorrhage, hydrocephalus, extra-axial collection or mass lesion/mass effect. Bilateral deep brain stimulator leads in generally expected vicinity of the subthalamic nucleus. Periventricular white matter hypodensity.  Vascular: No hyperdense vessel or unexpected calcification. Skull: Normal. Negative for fracture or focal lesion. Sinuses/Orbits: No acute finding. Other: None. CT CERVICAL SPINE FINDINGS Alignment: Normal. Skull base and vertebrae: No acute fracture. No primary bone lesion or focal pathologic process. Soft tissues and spinal canal: No prevertebral fluid or swelling. No visible canal hematoma. Disc levels: Mild disc degenerative change of the lower cervical levels. Upper chest: Negative. Other: None. IMPRESSION: 1. No acute intracranial pathology. Small-vessel white matter disease. 2. Bilateral deep brain stimulator leads. 3. No fracture or static subluxation of the cervical spine. Electronically Signed   By: Fredricka Jenny M.D.   On: 07/05/2023 21:59   DG Pelvis Portable Result Date: 07/05/2023 CLINICAL DATA:  Right hip pain after falls. EXAM: PORTABLE PELVIS 1-2 VIEWS COMPARISON:  Pelvis and right hip radiograph 07/01/2023 FINDINGS: No acute fracture of the pelvis or right hip. No evidence of healing fracture. No hip dislocation. Mild osteoarthritis unchanged from recent exam. Pubic rami are intact. Pubic symphysis  and sacroiliac joints are congruent. Pelvic phleboliths and surgical clips. IMPRESSION: 1. No acute fracture of the pelvis or right hip. 2. Mild osteoarthritis. Electronically Signed   By: Chadwick Colonel M.D.   On: 07/05/2023 20:51    EKG: Independently reviewed.  Normal sinus rhythm.  Assessment/Plan Principal Problem:   Acute encephalopathy Active Problems:   Parkinson's disease (HCC)   Orthostatic hypotension   UTI (urinary tract infection)   Anemia    Acute encephalopathy cause not clear could be from UTI.  There may be a competent of dementia.  Will check ammonia levels TSH B12 and RPR.  Follow urine cultures.  On ceftriaxone .  Gently hydrate.  Consult physical therapy. Possible UTI on ceftriaxone .  Follow urine cultures. Parkinson disease on Sinemet . Orthostatic hypotension on  Florinef . Anemia check anemia panel follow CBC.  KUB is pending which was ordered because of constipation.  Since patient has acute encephalopathy will need further workup and more than 2 midnight stay.   DVT prophylaxis: Lovenox . Code Status: Full code as confirmed with patient's wife. Family Communication: Patient's wife. Disposition Plan: Medical floor. Consults called: Physical therapy. Admission status: Observation.

## 2023-07-06 NOTE — ED Notes (Signed)
Physical therapy bedside.

## 2023-07-06 NOTE — Progress Notes (Signed)
 Progress Note   Patient: [name] [name]:[id number] DOB: 1945-04-07 DOA: 07/05/2023     0 DOS: the patient was seen and examined on 07/06/2023   Brief hospital course: 78yo with h/o Parkinson's with orthostatic hypotension who presented on 6/2 with AMS.  He was previously seen in the ER on 5/29 with a fall.    Assessment and Plan:  Acute metabolic encephalopathy Patient presenting with encephalopathy as evidenced by confusion Concern for urinary infection as the cause given concurrent urinary symptoms Will monitor in observation status for now Also with underlying Parkinson's, consider dementia  UTI Known h/o nephrolithiasis with some hydronephrosis Scheduled for cystoscopy with bladder calculus litholapaxy on 6/11 with Dr. Dulcy Gibney +urinary symptoms including dysuria, frequency, urgency UA looks more dehydrated than infected but UTI is a definite consideration Urine culture pending Continue Ceftriaxone   Parkinson's with orthostatic hypotension Continue Sinemet  Continue Fludrocortisone  BP is currently well controlled PT/OT consulted with orthostatics  Anemia  Appears to be c/w chronic disease Unremarkable anemia panel  Constipation KUB agrees Will start bowel regimen  Anxiety Continue alprazolam   Recurrent falls Likely related to Parkinson's and/or orthostatic hypotension Negative head and C-spine CT Lumbar CT with severe L and moderate to severe R neuroforaminal narrowing at L4-5 Outpatient neurosurgery f/u recommended unless he develops acute symptoms needing sooner evaluation     Consultants: None  Procedures: None  Antibiotics: Ceftriaxone  6/2-     Subjective: Has Parkinson's with chronic recurrent orthostatic hypotension resulting in falls.  He has been altered recently (several days) with unusual behaviors and mild confusion.  He has had urinary symptoms - dysuria, urgency, frequency, difficulty starting/stopping his stream.  He has a known stone with  plan for intervention soon.  Physical Exam: Vitals:   07/06/23 1000 07/06/23 1134 07/06/23 1216 07/06/23 1300  BP: (!) 151/89   (!) 143/76  Pulse: 70   (!) 58  Resp: 17   18  Temp:  97.8 F (36.6 C)    TempSrc:  Oral    SpO2: 100%  100% 100%     Intake/Output Summary (Last 24 hours) at 07/06/2023 1409 Last data filed at 07/06/2023 0457 Gross per 24 hour  Intake 588.85 ml  Output --  Net 588.85 ml   There were no vitals filed for this visit.  Exam:  General:  Appears calm and comfortable and is in NAD, chronically ill Eyes:  normal lids,mostly closed ENT:  grossly normal hearing, lips & tongue, mmm Cardiovascular:  RRR. No LE edema.  Respiratory:   CTA bilaterally with no wheezes/rales/rhonchi.  Normal respiratory effort. Abdomen:  soft, NT, ND Skin:  no rash or induration seen on limited exam Musculoskeletal:  grossly normal tone BUE/BLE, good ROM, no bony abnormality Lower extremity:  No LE edema.  Limited foot exam with no ulcerations.  2+ distal pulses. Psychiatric:  blunted mood and affect, speech very quiet and difficult to hear Neurologic:  CN 2-12 grossly intact, moves all extremities in coordinated fashion  Data Reviewed: I have reviewed the patient's lab results since admission.  Pertinent labs for today include:   Glucose 116 Albumin 3.3 WBC 5.8 Hgb 12 UA: 50 glucose, small Hgb, 5 ketones, moderate LE, 30 protein, rare bacteria    Family Communication: Wife present  Disposition: Status is: Inpatient Admit - It is my clinical opinion that admission to INPATIENT is reasonable and necessary because of the expectation that this patient will require hospital care that crosses at least 2 midnights to treat this condition based on  the medical complexity of the problems presented.  Given the aforementioned information, the predictability of an adverse outcome is felt to be significant.     Planned Discharge Destination: Skilled nursing facility    Time spent:  50 minutes  Author: Lorita Rosa, MD 07/06/2023 2:09 PM  For on call review [email or web address].

## 2023-07-06 NOTE — Evaluation (Signed)
 Physical Therapy Evaluation Patient Details Name: [name] [name] MRN: [id number] DOB: 07/30/1945 Today's Date: 07/06/2023  History of Present Illness  78 yo male presents to therapy following presentation to ED on 07/05/2023 due to increased confusion over the past few days following a fall 5/29. Pt is having difficulty following commands and c/o R hip pain imaging negative fro acute findings on 5/29. Pt dx with acute encephalopathy and suspect UTI. Pt PMH includes but is not limited to: PD with deep brain stimulator, orthostatic hypotension, agent orange exposure, anemia, depression, falls, GERD, kidney stones, and HOH.  Clinical Impression     Pt admitted with above diagnosis.  Pt currently with functional limitations due to the deficits listed below (see PT Problem List). Pt presenting to PT in ED, spouse at bedside for a portion of eval. Pt indicates falling frequently and episodes of "blacking out.' Pt also states he is fearful of falling and sometimes has good days and he is able to walk with PD walker and other days he is unable. Pt required mod A for supine to sit, min A for sit to stand, min A to side step to R to [facility], min A to return to bed. Pt limited due to symptomatic orthostatic hypotension, please see below. Pt left in bed all needs in place and OT entering. Patient will benefit from continued inpatient follow up therapy, <3 hours/day. Pt will benefit from acute skilled PT to increase their independence and safety with mobility to allow discharge.   Bp supine 138/78 Bp seated EOB 127/97 Bp immediate standing 103/60 Bp s/p 3 min standing 88/52      If plan is discharge home, recommend the following: Two people to help with walking and/or transfers;A lot of help with bathing/dressing/bathroom;Assistance with cooking/housework;Assist for transportation;Help with stairs or ramp for entrance   Can travel by private vehicle   No    Equipment Recommendations None recommended by PT   Recommendations for Other Services       Functional Status Assessment Patient has had a recent decline in their functional status and demonstrates the ability to make significant improvements in function in a reasonable and predictable amount of time.     Precautions / Restrictions Precautions Precautions: Fall Precaution/Restrictions Comments: orthostatic Restrictions Weight Bearing Restrictions Per Provider Order: No      Mobility  Bed Mobility Overal bed mobility: Needs Assistance Bed Mobility: Supine to Sit, Sit to Supine     Supine to sit: Mod assist, HOB elevated Sit to supine: Min assist   General bed mobility comments: incresed time with difficulty initiating movements    Transfers Overall transfer level: Needs assistance Equipment used: Rolling walker (2 wheels) Transfers: Sit to/from Stand Sit to Stand: Min assist           General transfer comment: cues for posture and anterior weight shift with pull to stand    Ambulation/Gait Ambulation/Gait assistance: Min assist Gait Distance (Feet): 2 Feet Assistive device: Rolling walker (2 wheels) Gait Pattern/deviations: Step-to pattern Gait velocity: decreased     General Gait Details: min A for side stepping toward the R to [facility] gait limited due to symptomatic orthostatic hypotension  Stairs            Wheelchair Mobility     Tilt Bed    Modified Rankin (Stroke Patients Only)       Balance Overall balance assessment: Needs assistance, History of Falls Sitting-balance support: Feet supported Sitting balance-Leahy Scale: Good     Standing  balance support: Bilateral upper extremity supported, During functional activity, Reliant on assistive device for balance Standing balance-Leahy Scale: Poor                               Pertinent Vitals/Pain      Home Living Family/patient expects to be discharged to:: Private residence Living Arrangements: Spouse/significant  other;Other relatives Available Help at Discharge: Family;Available PRN/intermittently;Personal care attendant (PCA 3 x wk) Type of Home: House Home Access: Stairs to enter;Other (comment) (chair lift)   Entrance Stairs-Number of Steps: 3   Home Layout: Two level;Able to live on main level with bedroom/bathroom (bonus room upstairs) Home Equipment: Shower seat - built [employer]raiser (2 wheels);Grab bars - tub/shower      Prior Function Prior Level of Function : Needs assist       Physical Assist : Mobility (physical);ADLs (physical)     Mobility Comments: pt amb short household distances with PD walker, pt has fear of falling and significant fall hx. ADLs Comments: pt fluctuates in ability and reports sometimes requires A for bathing and dressing tasks     Extremity/Trunk Assessment        Lower Extremity Assessment Lower Extremity Assessment: Generalized weakness    Cervical / Trunk Assessment Cervical / Trunk Assessment: Kyphotic  Communication   Communication Communication: Impaired Factors Affecting Communication:  (decreased volume)    Cognition Arousal: Alert (Simultaneous filing. User may not have seen previous data.) Behavior During Therapy: [facility] for tasks assessed/performed (Simultaneous filing. User may not have seen previous data.)   PT - Cognitive impairments: No apparent impairments                         Following commands: Intact (Simultaneous filing. User may not have seen previous data.)       Cueing       General Comments General comments (skin integrity, edema, etc.): pt reported having eyes irritated and noted rubor and mild edema R > L    Exercises     Assessment/Plan    PT Assessment Patient needs continued PT services  PT Problem List Decreased strength;Decreased range of motion;Decreased activity tolerance;Decreased balance;Decreased mobility;Decreased coordination       PT Treatment Interventions DME  instruction;Gait training;Functional mobility training;Therapeutic activities;Therapeutic exercise;Neuromuscular re-education;Patient/family education    PT Goals (Current goals can be found in the Care Plan section)  Acute Rehab PT Goals Patient Stated Goal: to not feel like I am going to fall and blackout PT Goal Formulation: With patient Time For Goal Achievement: 07/20/23 Potential to Achieve Goals: Fair    Frequency Min 3X/week     Co-evaluation               AM-PAC PT "6 Clicks" Mobility  Outcome Measure Help needed turning from your back to your side while in a flat bed without using bedrails?: A Little Help needed moving from lying on your back to sitting on the side of a flat bed without using bedrails?: A Lot Help needed moving to and from a bed to a chair (including a wheelchair)?: A Little Help needed standing up from a chair using your arms (e.g., wheelchair or bedside chair)?: A Little Help needed to walk in hospital room?: Total Help needed climbing 3-5 steps with a railing? : Total 6 Click Score: 13    End of Session Equipment Utilized During Treatment: Gait belt Activity Tolerance: Treatment limited  secondary to medical complications (Comment) (orthostatic hypotension) Patient left: in bed;with call bell/phone within reach Nurse Communication: Mobility status;Other (comment) (Bp findings) PT Visit Diagnosis: Unsteadiness on feet (R26.81);Other abnormalities of gait and mobility (R26.89);Repeated falls (R29.6);Muscle weakness (generalized) (M62.81);Difficulty in walking, not elsewhere classified (R26.2)    Time: 3086-5784 PT Time Calculation (min) (ACUTE ONLY): 42 min   Charges:   PT Evaluation $PT Eval Moderate Complexity: 1 Mod PT Treatments $Therapeutic Activity: 23-37 mins PT General Charges $$ ACUTE PT VISIT: 1 Visit         Cary Clarks, PT Acute Rehab   Annalee Kiang 07/06/2023, 12:19 PM

## 2023-07-06 NOTE — Plan of Care (Signed)

## 2023-07-06 NOTE — Evaluation (Signed)
 Occupational Therapy Evaluation Patient Details Name: [name] [name] MRN: [id number] DOB: 09/30/1945 Today's Date: 07/06/2023   History of Present Illness   78 yo male presents to therapy following presentation to ED on 07/05/2023 due to increased confusion over the past few days following a fall 5/29. Pt is having difficulty following commands and c/o R hip pain imaging negative fro acute findings on 5/29. Pt dx with acute encephalopathy and suspect UTI. Pt PMH includes but is not limited to: PD with deep brain stimulator, orthostatic hypotension, agent orange exposure, anemia, depression, falls, GERD, kidney stones, and HOH.     Clinical Impressions Patient is a 78 year old male who was admitted for above. Patient was limited to bed level with recent orthostatic vitals. Patient was noted to have decreased functional activity tolernace, decreased ROM, decreased BUE strength, decreased endurance, decreased sitting balance, decreased standing balanced, decreased safety awareness, and decreased knowledge of AE/AD impacting participation in ADLs. Patient will benefit from continued inpatient follow up therapy, <3 hours/day.      If plan is discharge home, recommend the following:   Two people to help with walking and/or transfers;A lot of help with bathing/dressing/bathroom;Assistance with cooking/housework;Direct supervision/assist for medications management;Assist for transportation;Help with stairs or ramp for entrance;Direct supervision/assist for financial management     Functional Status Assessment   Patient has had a recent decline in their functional status and demonstrates the ability to make significant improvements in function in a reasonable and predictable amount of time.     Equipment Recommendations   None recommended by OT      Precautions/Restrictions   Precautions Precautions: Fall Precaution/Restrictions Comments: orthostatic Restrictions Weight Bearing  Restrictions Per Provider Order: No            ADL either performed or assessed with clinical judgement   ADL Overall ADL's : Needs assistance/impaired Eating/Feeding: Sitting;Supervision/ safety;Minimal assistance Eating/Feeding Details (indicate cue type and reason): reported that tremors were a litle worse than at home but reported that he does not use weighted utentils because " it doesnt stop you from poking your eye with the fork". patient reported he does not need any further help with feeding. Grooming: Oral care;Supervision/safety;Set up Grooming Details (indicate cue type and reason): in chair position in ED bed after orthostatic vitals with PT. Upper Body Bathing: Bed level;Contact guard assist   Lower Body Bathing: Bed level;Maximal assistance   Upper Body Dressing : Sitting;Contact guard assist   Lower Body Dressing: Bed level;Maximal assistance     Toilet Transfer Details (indicate cue type and reason): unable to assess with soft BPs. Toileting- Clothing Manipulation and Hygiene: Bed level;Total assistance               Vision   Additional Comments: patient preferring to keep eyes closed during session reporting some discomfort around L eye byt noted to have more redness on R eyelid.            Pertinent Vitals/Pain Pain Assessment Pain Assessment: No/denies pain     Extremity/Trunk Assessment     Lower Extremity Assessment Lower Extremity Assessment: Generalized weakness   Cervical / Trunk Assessment Cervical / Trunk Assessment: Kyphotic   Communication Communication Communication: Impaired Factors Affecting Communication:  (decreased volume)   Cognition Arousal: Alert Behavior During Therapy: WFL for tasks assessed/performed               OT - Cognition Comments: very soft spoken  Following commands: Intact       Cueing  General Comments      lateral lean to R side in bed with HOB raised. needing mid A  to readjust posture.           Home Living Family/patient expects to be discharged to:: Private residence Living Arrangements: Spouse/significant other;Other relatives Available Help at Discharge: Family;Available PRN/intermittently;Personal care attendant (PCA 3 x wk) Type of Home: House Home Access: Stairs to enter;Other (comment) (chair lift) Entrance Stairs-Number of Steps: 3   Home Layout: Two level;Able to live on main level with bedroom/bathroom (bonus room upstairs)     Bathroom Shower/Tub: Walk-in shower         Home Equipment: Shower seat - built [employer] fundraiser (2 wheels);Grab bars - tub/shower          Prior Functioning/Environment Prior Level of Function : Needs assist       Physical Assist : Mobility (physical);ADLs (physical)     Mobility Comments: pt amb short household distances with PD walker, pt has fear of falling and significant fall hx. ADLs Comments: pt fluctuates in ability and reports sometimes requires A for bathing and dressing tasks    OT Problem List: Decreased activity tolerance;Decreased knowledge of use of DME or AE;Impaired balance (sitting and/or standing);Decreased safety awareness;Decreased knowledge of precautions;Cardiopulmonary status limiting activity;Decreased strength   OT Treatment/Interventions: Self-care/ADL training;Energy conservation;Neuromuscular education;Therapeutic activities;Patient/family education;Therapeutic exercise;Balance training      OT Goals(Current goals can be found in the care plan section)   Acute Rehab OT Goals Patient Stated Goal: to get back home OT Goal Formulation: With patient Time For Goal Achievement: 07/20/23 Potential to Achieve Goals: Fair   OT Frequency:  Min 2X/week       AM-PAC OT "6 Clicks" Daily Activity     Outcome Measure Help from another person eating meals?: A Little Help from another person taking care of personal grooming?: A Little Help from another person toileting,  which includes using toliet, bedpan, or urinal?: A Lot Help from another person bathing (including washing, rinsing, drying)?: A Lot Help from another person to put on and taking off regular upper body clothing?: A Little Help from another person to put on and taking off regular lower body clothing?: A Lot 6 Click Score: 15   End of Session    Activity Tolerance: Patient limited by fatigue Patient left: in bed;with call bell/phone within reach (in ED)  OT Visit Diagnosis: Unsteadiness on feet (R26.81);Other abnormalities of gait and mobility (R26.89)                Time: 4098-1191 OT Time Calculation (min): 17 min Charges:  OT General Charges $OT Visit: 1 Visit OT Evaluation $OT Eval Moderate Complexity: 1 Mod  [name] [name] OTR/L, MS Acute Rehabilitation Department Office# [phone]   [name] [name] 07/06/2023, 1:30 PM

## 2023-07-07 DIAGNOSIS — G9340 Encephalopathy, unspecified: Secondary | ICD-10-CM | POA: Diagnosis not present

## 2023-07-07 LAB — URINE CULTURE: Culture: <10000 — AB

## 2023-07-07 MED ORDER — CYANOCOBALAMIN 1000 MCG/ML IJ SOLN
1000.0000 ug | Freq: Every day | INTRAMUSCULAR | Status: DC
  Administered 2023-07-07 – 2023-07-08 (×2): 1000 ug via SUBCUTANEOUS
  Filled 2023-07-07 (×2): qty 1

## 2023-07-07 MED ORDER — SODIUM CHLORIDE 0.9 % IV SOLN: INTRAVENOUS | Status: AC

## 2023-07-07 MED ORDER — CYANOCOBALAMIN 1000 MCG/ML IJ SOLN
1000.0000 ug | Freq: Once | INTRAMUSCULAR | Status: DC
  Filled 2023-07-07: qty 1

## 2023-07-07 MED ORDER — FLUDROCORTISONE ACETATE 0.1 MG PO TABS
0.1000 mg | ORAL_TABLET | Freq: Three times a day (TID) | ORAL | Status: DC
  Administered 2023-07-07 – 2023-07-10 (×8): 0.1 mg via ORAL
  Filled 2023-07-07 (×9): qty 1

## 2023-07-07 NOTE — Plan of Care (Signed)

## 2023-07-07 NOTE — Progress Notes (Signed)
 [facility] Progress Note  Patient: [name] [name]     WGN:[id number]  DOA: 07/05/2023   PCP: Dondra Fuel, MD       Brief hospital course: 78 year old male with Parkinson's disease and orthostatic hypotension was brought to the hospital for confusion. In the ED, UA was concerning for UTI.  He was noted to be oriented to name and place.  He was admitted for treatment for acute encephalopathy and a possible UTI and ceftriaxone  was started.  Further workup for encephalopathy was ordered  Subjective:  Patient states that he has been having episodes of confusion at home.  He also is worried that his blood pressure drop suddenly as soon as he stands up and that he was unable to ambulate today.  Assessment and Plan: Principal Problem:   Acute encephalopathy - CT of the head reveals deep brain stimulator leads in place without any other abnormality - Vitamin B12 176-start replacing with subcu B12 - Ammonia level normal at 18 [facility] if his episodes of confusion may be related to episodes of orthostatic hypotension  Active Problems:   Orthostatic hypotension - Quite orthostatic today - Increase Florinef  from twice daily to 3 times daily - Resume IV fluids as he continues to appear dehydrated    Parkinson's disease (HCC)   - Resume Sinemet      UTI (urinary tract infection)? Culture showing less than 10,000 colonies-DC antibiotics  Recurrent falls - PT OT eval completed-he ambulated 2 feet - SNF recommended-he and his wife would like him to go home-she may have to hire more private caregivers and make some changes in the home to accommodate him      Code Status: Full Code Total time on patient care: 40 min DVT prophylaxis:  enoxaparin  (LOVENOX ) injection 40 mg Start: 07/06/23 1000     Objective:   Vitals:   07/07/23 0246 07/07/23 0553 07/07/23 1435 07/07/23 1606  BP: (!) 154/76 (!) 150/79 (!) 152/70   Pulse: 65 66 87   Resp: 18 18 20    Temp: 98 F (36.7 C) 98.1  F (36.7 C) (!) 97.5 F (36.4 C)   TempSrc: Oral Oral Oral   SpO2: 99% 98% 99%   Weight:    70 kg  Height:       Filed Weights   07/07/23 1606  Weight: 70 kg   Exam: General exam: Appears comfortable  HEENT: oral mucosa moist Respiratory system: Clear to auscultation.  Cardiovascular system: S1 & S2 heard  Gastrointestinal system: Abdomen soft, non-tender, nondistended. Normal bowel sounds   Extremities: No cyanosis, clubbing or edema Psychiatry:  Mood & affect appropriate.      CBC: Recent Labs  Lab 07/01/23 1032 07/05/23 1935 07/06/23 0516  WBC 8.9 8.6 5.8  NEUTROABS 7.3  --  3.7  HGB 12.3* 12.9* 12.0*  HCT 38.1* 38.9* 36.3*  MCV 98.4 96.3 97.3  PLT 282 301 263   Basic Metabolic Panel: Recent Labs  Lab 07/01/23 1032 07/05/23 1935 07/06/23 0516  NA 139 135 135  K 4.1 4.1 3.5  CL 106 104 103  CO2 25 24 26   GLUCOSE 103* 110* 116*  BUN 27* 41* 39*  CREATININE 1.27* 1.14 0.96  CALCIUM  9.3 9.7 9.1     Scheduled Meds:  carbidopa -levodopa   1 tablet Oral QHS   carbidopa -levodopa   2 tablet Oral QID   docusate sodium   100 mg Oral BID   enoxaparin  (LOVENOX ) injection  40 mg Subcutaneous Q24H   fludrocortisone   0.1 mg  Oral BID   polyethylene glycol  17 g Oral Daily   potassium chloride  SA  20 mEq Oral Daily    Imaging and lab data personally reviewed   Author: Richar Dunklee  07/07/2023 4:09 PM  To contact [facility]>   Check the care team in [facility] and look for the attending/consulting [facility] provider listed  Log into [email or web address] and use [place]'s universal password   Go to> "[facility]"  and find provider  If you still have difficulty reaching the provider, please page the [facility] (Director on Call) for the Hospitalists listed on amion

## 2023-07-07 NOTE — Plan of Care (Signed)
   Problem: Education: Goal: Knowledge of General Education information will improve Description: Including pain rating scale, medication(s)/side effects and non-pharmacologic comfort measures Outcome: Progressing   Problem: Nutrition: Goal: Adequate nutrition will be maintained Outcome: Progressing   Problem: Safety: Goal: Ability to remain free from injury will improve Outcome: Progressing

## 2023-07-07 NOTE — TOC Initial Note (Signed)
 Transition of Care [facility]) - Initial/Assessment Note    Patient Details  Name: [name] [name] MRN: [id number] Date of Birth: 1945-11-19  Transition of Care [facility]) CM/SW Contact:    [name] Rodriguez, RN Phone Number:[phone]  07/07/2023, 3:57 PM  Clinical Narrative:                 [facility] acknowledges consult for SNF for short term rehab. CM at bedside introduced self and explained role.  Patient and daughter at bedside. Daughter states that patients wife will make all decisions. Wife is not present. Patient can not answer questions. CM attempted to call wife [name] [name] [phone]. There is no answer, message has been left. CM will follow up with wife for disposition planning.     Barriers to Discharge: Continued Medical Work up   Patient Goals and CMS Choice            Expected Discharge Plan and Services                                              Prior Living Arrangements/Services                       Activities of Daily Living   ADL Screening (condition at time of admission) Is the patient deaf or have difficulty hearing?: No Does the patient have difficulty seeing, even when wearing glasses/contacts?: No Does the patient have difficulty concentrating, remembering, or making decisions?: No  Permission Sought/Granted                  Emotional Assessment              Admission diagnosis:  Acute cystitis without hematuria [N30.00] Acute encephalopathy [G93.40] Altered mental status, unspecified altered mental status type [R41.82] Acute metabolic encephalopathy [G93.41] Patient Active Problem List   Diagnosis Date Noted   Acute encephalopathy 07/06/2023   UTI (urinary tract infection) 07/06/2023   Anemia 07/06/2023   Acute metabolic encephalopathy 07/06/2023   Nephrolithiasis 08/13/2022   Bladder stones 09/13/2020   Gross hematuria 09/12/2020   Orthostatic hypotension 10/27/2019   Acute pyelonephritis 12/16/2012   Sepsis (HCC)  12/15/2012   Parkinson's disease (HCC) 12/15/2012   PCP:  Dondra Fuel, MD Pharmacy:   [phone] - [place], [place] - [address] AT [facility] [address] [place] [address] Phone: [phone] Fax: [phone]  [id number] [place], [place] - [address] AT [facility] [address] [place] [address] Phone: [phone] Fax: [phone]     Social Drivers of Health (SDOH) Social History: SDOH Screenings   Food Insecurity: No Food Insecurity (07/06/2023)  Housing: Low Risk  (07/06/2023)  Transportation Needs: No Transportation Needs (07/06/2023)  Utilities: Not At Risk (07/06/2023)  Social Connections: Moderately Isolated (07/06/2023)  Tobacco Use: Low Risk  (07/06/2023)   SDOH Interventions:     Readmission Risk Interventions     No data to display

## 2023-07-07 NOTE — Patient Instructions (Addendum)
 SURGICAL WAITING ROOM VISITATION Patients having surgery or a procedure may have no more than 2 support people in the waiting area - these visitors may rotate.    Children under the age of 77 must have an adult with them who is not the patient.  If the patient needs to stay at the hospital during part of their recovery, the visitor guidelines for inpatient rooms apply. Pre-op nurse will coordinate an appropriate time for 1 support person to accompany patient in pre-op.  This support person may not rotate.    Please refer to the [facility] website for the visitor guidelines for Inpatients (after your surgery is over and you are in a regular room).       Your procedure is scheduled on: 07-14-23   Report to [facility] Main Entrance    Report to admitting at 6:15 AM   Call this number if you have problems the morning of surgery [phone]   Do not eat food or drink liquids:After Midnight.          If you have questions, please contact your surgeon's office.   FOLLOW ANY ADDITIONAL PRE OP INSTRUCTIONS YOU RECEIVED FROM YOUR SURGEON'S OFFICE!!!     Oral Hygiene is also important to reduce your risk of infection.                                    Remember - BRUSH YOUR TEETH THE MORNING OF SURGERY WITH YOUR REGULAR TOOTHPASTE   Do NOT smoke after Midnight   Take these medicines the morning of surgery with A SIP OF WATER :    Carbidopa -lovodopa   Fludrocortisone    Alprazolam  if needed  Stop all vitamins and herbal supplements 7 days before surgery  Bring CPAP mask and tubing day of surgery.                              You may not have any metal on your body including jewelry, and body piercing             Do not wear  lotions, powders, cologne, or deodorant              Men may shave face and neck.   Do not bring valuables to the hospital. [place] IS NOT RESPONSIBLE   FOR VALUABLES.   Contacts, dentures or bridgework may not be worn into surgery.  DO NOT  BRING YOUR HOME MEDICATIONS TO THE HOSPITAL. PHARMACY WILL DISPENSE MEDICATIONS LISTED ON YOUR MEDICATION LIST TO YOU DURING YOUR ADMISSION IN THE HOSPITAL!    Patients discharged on the day of surgery will not be allowed to drive home.  Someone NEEDS to stay with you for the first 24 hours after anesthesia.   Special Instructions: Bring a copy of your healthcare power of attorney and living will documents the day of surgery if you haven't scanned them before.              Please read over the following fact sheets you were given: IF YOU HAVE QUESTIONS ABOUT YOUR PRE-OP INSTRUCTIONS PLEASE CALL [phone] Gwen  If you received a COVID test during your pre-op visit  it is requested that you wear a mask when out in public, stay away from anyone that may not be feeling well and notify your surgeon if you develop symptoms. If you test  positive for Covid or have been in contact with anyone that has tested positive in the last 10 days please notify you surgeon.  [place] - Preparing for Surgery Before surgery, you can play an important role.  Because skin is not sterile, your skin needs to be as free of germs as possible.  You can reduce the number of germs on your skin by washing with CHG (chlorahexidine gluconate) soap before surgery.  CHG is an antiseptic cleaner which kills germs and bonds with the skin to continue killing germs even after washing. Please DO NOT use if you have an allergy to CHG or antibacterial soaps.  If your skin becomes reddened/irritated stop using the CHG and inform your nurse when you arrive at Short Stay. Do not shave (including legs and underarms) for at least 48 hours prior to the first CHG shower.  You may shave your face/neck.  Please follow these instructions carefully:  1.  Shower with CHG Soap the night before surgery and the  morning of surgery.  2.  If you choose to wash your hair, wash your hair first as usual with your normal  shampoo.  3.  After you shampoo,  rinse your hair and body thoroughly to remove the shampoo.                             4.  Use CHG as you would any other liquid soap.  You can apply chg directly to the skin and wash.  Gently with a scrungie or clean washcloth.  5.  Apply the CHG Soap to your body ONLY FROM THE NECK DOWN.   Do   not use on face/ open                           Wound or open sores. Avoid contact with eyes, ears mouth and   genitals (private parts).                       Wash face,  Genitals (private parts) with your normal soap.             6.  Wash thoroughly, paying special attention to the area where your    surgery  will be performed.  7.  Thoroughly rinse your body with warm water  from the neck down.  8.  DO NOT shower/wash with your normal soap after using and rinsing off the CHG Soap.                9.  Pat yourself dry with a clean towel.            10.  Wear clean pajamas.            11.  Place clean sheets on your bed the night of your first shower and do not  sleep with pets. Day of Surgery : Do not apply any lotions/deodorants the morning of surgery.  Please wear clean clothes to the hospital/surgery center.  FAILURE TO FOLLOW THESE INSTRUCTIONS MAY RESULT IN THE CANCELLATION OF YOUR SURGERY  PATIENT SIGNATURE_________________________________  NURSE SIGNATURE__________________________________  ________________________________________________________________________

## 2023-07-08 ENCOUNTER — Telehealth: Payer: Self-pay | Admitting: Neurology

## 2023-07-08 DIAGNOSIS — G9340 Encephalopathy, unspecified: Secondary | ICD-10-CM | POA: Diagnosis not present

## 2023-07-08 MED ORDER — VITAMIN B-12 1000 MCG PO TABS: 1000.0000 ug | ORAL_TABLET | Freq: Every day | ORAL | Status: DC

## 2023-07-08 MED ORDER — SODIUM CHLORIDE 0.9 % IV BOLUS
1000.0000 mL | Freq: Once | INTRAVENOUS | Status: AC
  Administered 2023-07-08: 1000 mL via INTRAVENOUS

## 2023-07-08 MED ORDER — VITAMIN B-12 1000 MCG PO TABS
1000.0000 ug | ORAL_TABLET | Freq: Every day | ORAL | Status: DC
  Administered 2023-07-09 – 2023-07-10 (×2): 1000 ug via ORAL
  Filled 2023-07-08 (×2): qty 1

## 2023-07-08 NOTE — Progress Notes (Signed)
 [facility] Progress Note  Patient: [name] [name]     NGE:[id number]  DOA: 07/05/2023   PCP: Dondra Fuel, MD       Brief hospital course: 78 year old male with Parkinson's disease and orthostatic hypotension was brought to the hospital for confusion. In the ED, UA was concerning for UTI.  He was noted to be oriented to name and place.  He was admitted for treatment for acute encephalopathy and a possible UTI and ceftriaxone  was started.  Further workup for encephalopathy was ordered  Subjective:  Continues to feel very weak and having difficulty standing for long periods of time.  No episodes of confusion in the hospital.  Assessment and Plan: Principal Problem:   Acute encephalopathy - CT of the head reveals deep brain stimulator leads in place without any other abnormality - Vitamin B12 176-given subcutaneous B12 on 6/4 and started on oral - Ammonia level normal at [facility] if his episodes of confusion may be related to episodes of orthostatic hypotension  Active Problems:   Orthostatic hypotension -Given a normal saline bolus of 1 L today - Increase Florinef  from twice daily to 3 times daily -Add TED hose    Parkinson's disease (HCC)   - Sinemet      UTI (urinary tract infection)? Culture showing less than 10,000 colonies-DC'd antibiotics  Recurrent falls - PT OT eval completed-he ambulated 2 feet - SNF recommended-he and his wife would like him to go home-she is working on making arrangements for having more caregivers at home - More home health will also be needed    Code Status: Full Code Total time on patient care: 30 min DVT prophylaxis:  enoxaparin  (LOVENOX ) injection 40 mg Start: 07/06/23 1000     Objective:   Vitals:   07/07/23 1606 07/07/23 2059 07/08/23 0523 07/08/23 1334  BP:  139/71 (!) 158/87 135/70  Pulse:  77 65   Resp:  18 18   Temp:  97.9 F (36.6 C) 97.9 F (36.6 C) 98 F (36.7 C)  TempSrc:  Oral Oral Oral  SpO2:  98% 98%    Weight: 70 kg     Height:       Filed Weights   07/07/23 1606  Weight: 70 kg   Exam: General exam: Appears comfortable  HEENT: oral mucosa moist Respiratory system: Clear to auscultation.  Cardiovascular system: S1 & S2 heard  Gastrointestinal system: Abdomen soft, non-tender, nondistended. Normal bowel sounds   Extremities: No cyanosis, clubbing or edema Psychiatry:  Mood & affect appropriate.      CBC: Recent Labs  Lab 07/05/23 1935 07/06/23 0516  WBC 8.6 5.8  NEUTROABS  --  3.7  HGB 12.9* 12.0*  HCT 38.9* 36.3*  MCV 96.3 97.3  PLT 301 263   Basic Metabolic Panel: Recent Labs  Lab 07/05/23 1935 07/06/23 0516  NA 135 135  K 4.1 3.5  CL 104 103  CO2 24 26  GLUCOSE 110* 116*  BUN 41* 39*  CREATININE 1.14 0.96  CALCIUM  9.7 9.1     Scheduled Meds:  carbidopa -levodopa   1 tablet Oral QHS   carbidopa -levodopa   2 tablet Oral QID   cyanocobalamin  1,000 mcg Subcutaneous Daily   docusate sodium   100 mg Oral BID   enoxaparin  (LOVENOX ) injection  40 mg Subcutaneous Q24H   fludrocortisone   0.1 mg Oral TID   polyethylene glycol  17 g Oral Daily   potassium chloride  SA  20 mEq Oral Daily    Imaging and lab  data personally reviewed   Author: Coree Brame  07/08/2023 6:25 PM  To contact [facility]>   Check the care team in [facility] and look for the attending/consulting TRH provider listed  Log into [email or web address] and use [place]'s universal password   Go to> "[facility]"  and find provider  If you still have difficulty reaching the provider, please page the [facility] (Director on Call) for the Hospitalists listed on amion

## 2023-07-08 NOTE — TOC Progression Note (Signed)
 Transition of Care [facility]) - Progression Note    Patient Details  Name: STEPEN PRINS MRN: [id number] Date of Birth: 08/30/1945  Transition of Care [facility]) CM/SW Contact  Loreda Rodriguez, RN Phone Number:[phone]  07/08/2023, 10:11 AM  Clinical Narrative:    Cm at bedside to discuss disposition planning. CM explained therapy recommendation. Wife states that she does not want patient to go to a facility. Per wife she has a daughter and son that can assist some and she has been working with the [place] and has caregivers 3 days a week for 2.5 hrs to assist and she plans to request more hours for caregivers. Wife would like HH therapy with [facility]. TOC will continue to follow.      Barriers to Discharge: Continued Medical Work up  Expected Discharge Plan and Services                                               Social Determinants of Health (SDOH) Interventions SDOH Screenings   Food Insecurity: No Food Insecurity (07/06/2023)  Housing: Low Risk  (07/06/2023)  Transportation Needs: No Transportation Needs (07/06/2023)  Utilities: Not At Risk (07/06/2023)  Social Connections: Moderately Isolated (07/06/2023)  Tobacco Use: Low Risk  (07/06/2023)    Readmission Risk Interventions     No data to display

## 2023-07-08 NOTE — Plan of Care (Addendum)
 Pt alert and oriented x4, quiet volume, cooperative with care, denies pain, tolerated meals, Oob with PT, wife at bedside. Discontinued telemetry, MD notified, Plan of care continued.   Problem: Education: Goal: Knowledge of General Education information will improve Description: Including pain rating scale, medication(s)/side effects and non-pharmacologic comfort measures Outcome: Progressing   Problem: Health Behavior/Discharge Planning: Goal: Ability to manage health-related needs will improve Outcome: Progressing   Problem: Clinical Measurements: Goal: Ability to maintain clinical measurements within normal limits will improve Outcome: Progressing Goal: Will remain free from infection Outcome: Progressing Goal: Diagnostic test results will improve Outcome: Progressing Goal: Respiratory complications will improve Outcome: Progressing Goal: Cardiovascular complication will be avoided Outcome: Progressing   Problem: Activity: Goal: Risk for activity intolerance will decrease Outcome: Progressing   Problem: Nutrition: Goal: Adequate nutrition will be maintained Outcome: Progressing   Problem: Coping: Goal: Level of anxiety will decrease Outcome: Progressing   Problem: Elimination: Goal: Will not experience complications related to bowel motility Outcome: Progressing Goal: Will not experience complications related to urinary retention Outcome: Progressing   Problem: Pain Managment: Goal: General experience of comfort will improve and/or be controlled Outcome: Progressing   Problem: Safety: Goal: Ability to remain free from injury will improve Outcome: Progressing   Problem: Skin Integrity: Goal: Risk for impaired skin integrity will decrease Outcome: Progressing

## 2023-07-08 NOTE — TOC Progression Note (Signed)
 Transition of Care [facility]) - Progression Note    Patient Details  Name: [name] [name] MRN: [id number] Date of Birth: 01/14/1946  Transition of Care [facility]) CM/SW Contact  Loreda Rodriguez, RN Phone [phone]  07/08/2023, 3:37 PM  Clinical Narrative:    CM received call from wife stating that she will not have help set up in the home until Sat.      Barriers to Discharge: Continued Medical Work up  Expected Discharge Plan and Services                                               Social Determinants of Health (SDOH) Interventions SDOH Screenings   Food Insecurity: No Food Insecurity (07/06/2023)  Housing: Low Risk  (07/06/2023)  Transportation Needs: No Transportation Needs (07/06/2023)  Utilities: Not At Risk (07/06/2023)  Social Connections: Moderately Isolated (07/06/2023)  Tobacco Use: Low Risk  (07/06/2023)    Readmission Risk Interventions     No data to display

## 2023-07-08 NOTE — Progress Notes (Signed)
 Physical Therapy Treatment Patient Details Name: [name] [name] MRN: [id number] DOB: 1945-02-17 Today's Date: 07/08/2023   History of Present Illness 78 yo male presents to therapy following presentation to ED on 07/05/2023 due to increased confusion over the past few days following a fall 5/29. Pt is having difficulty following commands and c/o R hip pain imaging negative fro acute findings on 5/29. Pt dx with acute encephalopathy and suspect UTI. Pt PMH includes but is not limited to: PD with deep brain stimulator, orthostatic hypotension, agent orange exposure, anemia, depression, falls, GERD, kidney stones, and HOH.    PT Comments  Pt in bed, agreeable to PT session, slightly apprehensive due to recent orthos +. He is able to come to sit EOB with improved ability, CGA to attain upright trunk position. Pt able to stand EOB and take side steps towards HOB at supervision A level with RW. Pt returns to sitting and initiates HEP provided via handout to family. Education related to community resources including LSVT big when appropriate for outpatient. Gait belt provided and educated on use and safety. Problem solving trouble areas with transfers at home.    If plan is discharge home, recommend the following: Two people to help with walking and/or transfers;A lot of help with bathing/dressing/bathroom;Assistance with cooking/housework;Assist for transportation;Help with stairs or ramp for entrance   Can travel by private vehicle     No  Equipment Recommendations  None recommended by PT    Recommendations for Other Services       Precautions / Restrictions Precautions Precautions: Fall Recall of Precautions/Restrictions: Intact Precaution/Restrictions Comments: orthostatic-improving Restrictions Weight Bearing Restrictions Per Provider Order: No     Mobility  Bed Mobility Overal bed mobility: Needs Assistance Bed Mobility: Supine to Sit, Sit to Supine     Supine to sit: Mod assist, HOB  elevated Sit to supine: Contact guard assist   General bed mobility comments: able to iniate and complete most of the movement, CGA to attain full upright position    Transfers Overall transfer level: Needs assistance Equipment used: Rolling walker (2 wheels) Transfers: Sit to/from Stand Sit to Stand: Contact guard assist                Ambulation/Gait Ambulation/Gait assistance: Supervision Gait Distance (Feet): 3 Feet Assistive device: Rolling walker (2 wheels) Gait Pattern/deviations: Step-to pattern Gait velocity: decreased     General Gait Details: Pt side steps at EOB after standing x3-5 mins, improved tolerance but notes some light headedness. Able to complete small steps, but good cadence to place him closer to [facility]   Stairs             Wheelchair Mobility     Tilt Bed    Modified Rankin (Stroke Patients Only)       Balance Overall balance assessment: Needs assistance, History of Falls Sitting-balance support: Feet supported Sitting balance-Leahy Scale: Good     Standing balance support: Bilateral upper extremity supported, During functional activity, Reliant on assistive device for balance Standing balance-Leahy Scale: Poor                              Communication Communication Communication: Impaired Factors Affecting Communication: Other (comment) (dec volume)  Cognition Arousal: Alert Behavior During Therapy: WFL for tasks assessed/performed   PT - Cognitive impairments: No apparent impairments  Following commands: Intact      Cueing    Exercises General Exercises - Lower Extremity Long Arc Quad: AROM, Both, 10 reps, Seated Hip Flexion/Marching: AROM, 10 reps, Seated, Both Other Exercises Other Exercises: bridging x10 reps, 3 sec hold Other Exercises: Knee sway-unilateral x10 BLE reciprocal in supine hook lying    General Comments        Pertinent Vitals/Pain Pain  Assessment Pain Assessment: No/denies pain    Home Living                          Prior Function            PT Goals (current goals can now be found in the care plan section) Acute Rehab PT Goals Patient Stated Goal: to not feel like I am going to fall and blackout PT Goal Formulation: With patient Time For Goal Achievement: 07/20/23 Potential to Achieve Goals: Fair Progress towards PT goals: Progressing toward goals    Frequency    Min 3X/week      PT Plan      Co-evaluation              AM-PAC PT "6 Clicks" Mobility   Outcome Measure  Help needed turning from your back to your side while in a flat bed without using bedrails?: A Little Help needed moving from lying on your back to sitting on the side of a flat bed without using bedrails?: A Little Help needed moving to and from a bed to a chair (including a wheelchair)?: A Little Help needed standing up from a chair using your arms (e.g., wheelchair or bedside chair)?: A Little Help needed to walk in hospital room?: A Lot Help needed climbing 3-5 steps with a railing? : Total 6 Click Score: 15    End of Session Equipment Utilized During Treatment: Gait belt Activity Tolerance: Treatment limited secondary to medical complications (Comment) Patient left: in bed;with call bell/phone within reach;with family/visitor present Nurse Communication: Mobility status PT Visit Diagnosis: Unsteadiness on feet (R26.81);Other abnormalities of gait and mobility (R26.89);Repeated falls (R29.6);Muscle weakness (generalized) (M62.81);Difficulty in walking, not elsewhere classified (R26.2)     Time: 1610-9604 PT Time Calculation (min) (ACUTE ONLY): 55 min  Charges:    $Gait Training: 8-22 mins $Therapeutic Exercise: 8-22 mins $Therapeutic Activity: 23-37 mins PT General Charges $$ ACUTE PT VISIT: 1 Visit                     Darien Eden, PT Acute Rehabilitation Services Office: [phone] 07/08/2023    [name] 07/08/2023, 5:43 PM

## 2023-07-08 NOTE — Telephone Encounter (Signed)
 Pt is currently in the hospital. Pt passed out 3 times this month and is confused. He fell 3 times and broke ribs. The hospitalist added a third fludrocortisone  a day and wants Dr. Winferd Hatter to be aware and advise going forward. He is having surgery on his kidney stones on 07/14/23.

## 2023-07-08 NOTE — Plan of Care (Signed)
  Problem: Nutrition: Goal: Adequate nutrition will be maintained Outcome: Progressing   Problem: Elimination: Goal: Will not experience complications related to bowel motility Outcome: Progressing   Problem: Pain Managment: Goal: General experience of comfort will improve and/or be controlled Outcome: Progressing   Problem: Skin Integrity: Goal: Risk for impaired skin integrity will decrease Outcome: Progressing

## 2023-07-09 DIAGNOSIS — G9340 Encephalopathy, unspecified: Secondary | ICD-10-CM | POA: Diagnosis not present

## 2023-07-09 NOTE — Progress Notes (Signed)
 Physical Therapy Treatment Patient Details Name: [name] [name] MRN: [id number] DOB: [birth year] Today's Date: 07/09/2023   History of Present Illness 78 yo male presents to therapy following presentation to ED on 07/05/2023 due to increased confusion over the past few days following a fall 5/29. Pt is having difficulty following commands and c/o R hip pain imaging negative fro acute findings on 5/29. Pt dx with acute encephalopathy and suspect UTI. Pt PMH includes but is not limited to: PD with deep brain stimulator, orthostatic hypotension, agent orange exposure, anemia, depression, falls, GERD, kidney stones, and HOH.    PT Comments  Pt received in bed, daughter at bedside. Discussed role of PT and pt's current LOF. Family plans to take pt home where the home is well equipt with DME to support pt's needs. BP assessed again in supine, sitting, and standing with minimal symptoms and BP change from sit to stand. Pt able to advance to bedside chair with RW after taking several steps with RW. MinA due to decreased motor control and shuffling, overall good strength throughout. Pt positioned to comfort in recliner with all needs met. Continue PT per POC.    If plan is discharge home, recommend the following: A lot of help with bathing/dressing/bathroom;Assistance with cooking/housework;Assist for transportation;Help with stairs or ramp for entrance;A lot of help with walking and/or transfers   Can travel by private vehicle     No  Equipment Recommendations  None recommended by PT    Recommendations for Other Services       Precautions / Restrictions Precautions Precautions: Fall Recall of Precautions/Restrictions: Intact Precaution/Restrictions Comments: orthostatic-improving Restrictions Weight Bearing Restrictions Per Provider Order: No     Mobility  Bed Mobility Overal bed mobility: Needs Assistance Bed Mobility: Supine to Sit     Supine to sit: Mod assist, HOB elevated     General  bed mobility comments: cues for sequencing, assist for trunk and BLE, pt will have a hospital bed at home    Transfers Overall transfer level: Needs assistance Equipment used: Rolling walker (2 wheels) Transfers: Sit to/from Stand Sit to Stand: Min assist, Mod assist, From elevated surface           General transfer comment: ModA to stand from raised bed and wt shift forward to attain balance at RW    Ambulation/Gait Ambulation/Gait assistance: Min assist Gait Distance (Feet): 3 Feet Assistive device: Rolling walker (2 wheels) Gait Pattern/deviations: Step-to pattern, Shuffle, Festinating Gait velocity: decreased     General Gait Details: Increased time to advance a few steps and step backwards towards chair. Short shuffling steps, MinA to prevent posterior LOB.   Stairs Stairs:  (No, has a Art gallery manager mobility:  (Pt has a w/c and transport chair at home)   Tilt Bed    Modified Rankin (Stroke Patients Only)       Balance Overall balance assessment: Needs assistance, History of Falls Sitting-balance support: Feet supported Sitting balance-Leahy Scale: Good Sitting balance - Comments: Tolerated sitting 5 mins EOB. Minimal lightheadedness.   Standing balance support: Bilateral upper extremity supported, During functional activity, Reliant on assistive device for balance Standing balance-Leahy Scale: Poor Standing balance comment: High fall risk due to decreased motor control                            Communication Communication Communication: Impaired Factors Affecting Communication:  Reduced clarity of speech;Other (comment) (Very soft spoken)  Cognition Arousal: Alert Behavior During Therapy: WFL for tasks assessed/performed   PT - Cognitive impairments: No apparent impairments                         Following commands: Intact      Cueing Cueing Techniques: Verbal  cues  Exercises General Exercises - Lower Extremity Ankle Circles/Pumps: AROM, Both, 10 reps, Supine Heel Slides: AROM, Both, 10 reps, Supine Hip ABduction/ADduction: AROM, Both, 10 reps, Supine Other Exercises Other Exercises: Pt educated on role of PT and safe mobility with symptomatic hypotension    General Comments General comments (skin integrity, edema, etc.): Orthostatic vitals taken and recorded in flow sheet. Slightly symptomatic in standing with 30pt drop in systolic pressure, no significant HR change      Pertinent Vitals/Pain Pain Assessment Pain Assessment: 0-10 Pain Score: 3  Pain Location: Right hip Pain Descriptors / Indicators: Sore Pain Intervention(s): Monitored during session    Home Living                          Prior Function            PT Goals (current goals can now be found in the care plan section) Acute Rehab PT Goals Patient Stated Goal: to not feel like I am going to fall and blackout    Frequency    Min 3X/week      PT Plan      Co-evaluation              AM-PAC PT "6 Clicks" Mobility   Outcome Measure  Help needed turning from your back to your side while in a flat bed without using bedrails?: A Little Help needed moving from lying on your back to sitting on the side of a flat bed without using bedrails?: A Little Help needed moving to and from a bed to a chair (including a wheelchair)?: A Little Help needed standing up from a chair using your arms (e.g., wheelchair or bedside chair)?: A Little Help needed to walk in hospital room?: A Lot Help needed climbing 3-5 steps with a railing? : Total 6 Click Score: 15    End of Session Equipment Utilized During Treatment: Gait belt Activity Tolerance: Patient tolerated treatment well Patient left: in chair;with call bell/phone within reach;with chair alarm set Nurse Communication: Mobility status PT Visit Diagnosis: Unsteadiness on feet (R26.81);Other abnormalities of  gait and mobility (R26.89);Repeated falls (R29.6);Muscle weakness (generalized) (M62.81);Difficulty in walking, not elsewhere classified (R26.2)     Time: 9604-5409 PT Time Calculation (min) (ACUTE ONLY): 43 min  Charges:    $Gait Training: 8-22 mins $Therapeutic Exercise: 8-22 mins $Therapeutic Activity: 8-22 mins PT General Charges $$ ACUTE PT VISIT: 1 Visit                    Melvyn Stagers, PTA  Diona Franklin 07/09/2023, 2:39 PM

## 2023-07-09 NOTE — Plan of Care (Signed)

## 2023-07-09 NOTE — Progress Notes (Signed)
 Occupational Therapy Treatment Patient Details Name: [name] [name] MRN: [id number] DOB: 31-Jul-1945 Today's Date: 07/09/2023   History of present illness 78 yo male presents to therapy following presentation to ED on 07/05/2023 due to increased confusion over the past few days following a fall 5/29. Pt is having difficulty following commands and c/o R hip pain imaging negative fro acute findings on 5/29. Pt dx with acute encephalopathy and suspect UTI. Pt PMH includes but is not limited to: PD with deep brain stimulator, orthostatic hypotension, agent orange exposure, anemia, depression, falls, GERD, kidney stones, and HOH.   OT comments  Pt seen for OT tx this date. Requires MAX A for LB dressing bed level, MOD A for bed mobility with step-by-step cuing and assist for trunk/BLE mgmt. Pt with c/o lightheadedness with positional changes, see below for orthostatic vitals assessed. Pt completes seated marches while EOB, unable to progress to standing or transfer to chair secondary to fatigue and ongoing lightheadedness. Educated pt and daughter on importance of continuing to build tolerance to upright positioning throughout the day and discussed scope of OT in [facility] services. Pt returned to bed, placed in chair position and setup for lunch with tray. Given the current level of function, pt would benefit from continued inpatient follow up therapy, <3 hours/day. If pt and spouse do not want STR, pt will require 24/7 assist at home and [facility] OT services.  BP Lying 145/76 HR 65 BP Sitting 131/77 HR 67 BP in chair position in bed end of session 143/89 HR 70        If plan is discharge home, recommend the following:  Two people to help with walking and/or transfers;A lot of help with bathing/dressing/bathroom;Assistance with cooking/housework;Direct supervision/assist for medications management;Assist for transportation;Help with stairs or ramp for entrance;Direct supervision/assist for financial management    Equipment Recommendations  None recommended by OT       Precautions / Restrictions Precautions Precautions: Fall Recall of Precautions/Restrictions: Intact Precaution/Restrictions Comments: orthostatic-improving Restrictions Weight Bearing Restrictions Per Provider Order: No       Mobility Bed Mobility Overal bed mobility: Needs Assistance Bed Mobility: Supine to Sit, Sit to Supine     Supine to sit: Mod assist, HOB elevated Sit to supine: Mod assist   General bed mobility comments: cues for sequencing, assist for trunk and BLE    Transfers Overall transfer level: Needs assistance                 General transfer comment: unable to progress to standing this session     Balance Overall balance assessment: Needs assistance, History of Falls Sitting-balance support: Feet supported Sitting balance-Leahy Scale: Good Sitting balance - Comments: Tolerated sitting 5 mins EOB. Pt continues to c/o worsening lightheadedness.                                   ADL either performed or assessed with clinical judgement   ADL Overall ADL's : Needs assistance/impaired Eating/Feeding: Set up Eating/Feeding Details (indicate cue type and reason): in chair position in bed - bilat tremors noted but pt declines assist                                   General ADL Comments: Pt unable to progress to OOB mobility this date, sat EOB for ~5 mins with continued lightheadedness. Unable to  progress to standing or transfer attempts.     Communication Communication Communication: Impaired Factors Affecting Communication: Other (comment);Reduced clarity of speech (very soft voice, decreased volume)   Cognition Arousal: Alert Behavior During Therapy: WFL for tasks assessed/performed               OT - Cognition Comments: very soft spoken                 Following commands: Intact                 General Comments Orthostatic vitals  assessed. Pt symptomatic. TED hose donned per orders.    Pertinent Vitals/ Pain       Pain Assessment Pain Assessment: No/denies pain         Frequency  Min 2X/week        Progress Toward Goals  OT Goals(current goals can now be found in the care plan section)  Progress towards OT goals: Progressing toward goals  Acute Rehab OT Goals OT Goal Formulation: With patient Time For Goal Achievement: 07/20/23 Potential to Achieve Goals: Fair  Plan      AM-PAC OT "6 Clicks" Daily Activity     Outcome Measure   Help from another person eating meals?: A Little Help from another person taking care of personal grooming?: A Little Help from another person toileting, which includes using toliet, bedpan, or urinal?: A Lot Help from another person bathing (including washing, rinsing, drying)?: A Lot Help from another person to put on and taking off regular upper body clothing?: A Little Help from another person to put on and taking off regular lower body clothing?: A Lot 6 Click Score: 15    End of Session    OT Visit Diagnosis: Unsteadiness on feet (R26.81);Other abnormalities of gait and mobility (R26.89)   Activity Tolerance Patient limited by fatigue   Patient Left in bed;with call bell/phone within reach;with family/visitor present   Nurse Communication Mobility status        Time: 1123-1204 OT Time Calculation (min): 41 min  Charges: OT General Charges $OT Visit: 1 Visit OT Treatments $Self Care/Home Management : 8-22 mins $Therapeutic Activity: 23-37 mins  Jamekia Gannett L. Pina Sirianni, OTR/L  07/09/23, 1:33 PM

## 2023-07-09 NOTE — Plan of Care (Signed)

## 2023-07-09 NOTE — Progress Notes (Signed)
 [facility] Progress Note  Patient: [name] [name]     ZOX:[id number]  DOA: 07/05/2023   PCP: Dondra Fuel, MD       Brief hospital course: 78 year old male with Parkinson's disease and orthostatic hypotension was brought to the hospital for confusion. In the ED, UA was concerning for UTI.  He was noted to be oriented to name and place.  He was admitted for treatment for acute encephalopathy and a possible UTI and ceftriaxone  was started.  Further workup for encephalopathy was ordered  Subjective:  Patient has no complaints.    Assessment and Plan: Principal Problem:   Acute encephalopathy - CT of the head reveals deep brain stimulator leads in place without any other abnormality - Vitamin B12 176-given subcutaneous B12 on 6/4 and started on oral - Ammonia level normal at 18 -Appears to be improving-cause uncertain  Active Problems:   Orthostatic hypotension -Given a normal saline bolus of 1 L today - Increase Florinef  from twice daily to 3 times daily -Added TED hose - Orthostasis has resolved    Parkinson's disease (HCC)   - Sinemet      UTI (urinary tract infection)? Culture showing less than 10,000 colonies-DC'd antibiotics  Recurrent falls - PT OT eval completed-he ambulated 2 feet - SNF recommended-he and his wife would like him to go home-she is working on making arrangements for having more caregivers at home - More home health will also be needed    Code Status: Full Code Total time on patient care: 30 min DVT prophylaxis:  Place TED hose Start: 07/08/23 1827 enoxaparin  (LOVENOX ) injection 40 mg Start: 07/06/23 1000     Objective:   Vitals:   07/08/23 0523 07/08/23 1334 07/08/23 2024 07/09/23 0609  BP: (!) 158/87 135/70 (!) 144/81 (!) 146/87  Pulse: 65  77 63  Resp: 18  17 18   Temp: 97.9 F (36.6 C) 98 F (36.7 C) 97.7 F (36.5 C) 98.1 F (36.7 C)  TempSrc: Oral Oral Oral Oral  SpO2: 98%  100% 97%  Weight:      Height:       Filed  Weights   07/07/23 1606  Weight: 70 kg   Exam: General exam: Appears comfortable  HEENT: oral mucosa moist Respiratory system: Clear to auscultation.  Cardiovascular system: S1 & S2 heard  Gastrointestinal system: Abdomen soft, non-tender, nondistended. Normal bowel sounds   Extremities: No cyanosis, clubbing or edema Psychiatry:  Mood & affect appropriate.      CBC: Recent Labs  Lab 07/05/23 1935 07/06/23 0516  WBC 8.6 5.8  NEUTROABS  --  3.7  HGB 12.9* 12.0*  HCT 38.9* 36.3*  MCV 96.3 97.3  PLT 301 263   Basic Metabolic Panel: Recent Labs  Lab 07/05/23 1935 07/06/23 0516  NA 135 135  K 4.1 3.5  CL 104 103  CO2 24 26  GLUCOSE 110* 116*  BUN 41* 39*  CREATININE 1.14 0.96  CALCIUM  9.7 9.1     Scheduled Meds:  carbidopa -levodopa   1 tablet Oral QHS   carbidopa -levodopa   2 tablet Oral QID   vitamin B-12  1,000 mcg Oral Daily   docusate sodium   100 mg Oral BID   enoxaparin  (LOVENOX ) injection  40 mg Subcutaneous Q24H   fludrocortisone   0.1 mg Oral TID   polyethylene glycol  17 g Oral Daily   potassium chloride  SA  20 mEq Oral Daily    Imaging and lab data personally reviewed   Author: Josuha Fontanez  07/09/2023 2:57  PM  To contact [facility]>   Check the care team in [facility] and look for the attending/consulting [facility] provider listed  Log into [email or web address] and use [facility] universal password   Go to> "[facility]"  and find provider  If you still have difficulty reaching the provider, please page the [facility] (Director on Call) for the Hospitalists listed on amion

## 2023-07-10 DIAGNOSIS — G9341 Metabolic encephalopathy: Principal | ICD-10-CM

## 2023-07-10 MED ORDER — CYANOCOBALAMIN 1000 MCG PO TABS: 1000.0000 ug | ORAL_TABLET | Freq: Every day | ORAL | 0 refills | Status: AC

## 2023-07-10 MED ORDER — FLUDROCORTISONE ACETATE 0.1 MG PO TABS: 0.2000 mg | ORAL_TABLET | Freq: Three times a day (TID) | ORAL | 0 refills | Status: DC

## 2023-07-10 NOTE — Plan of Care (Signed)
 Read pt and pts wife discharge instructions. They both verbalized their understanding, pt vitals stable, going by PTAR. Discharge instructions placed in packet for transport. PIV intact upon removal.    Problem: Education: Goal: Knowledge of General Education information will improve Description: Including pain rating scale, medication(s)/side effects and non-pharmacologic comfort measures Outcome: Adequate for Discharge   Problem: Health Behavior/Discharge Planning: Goal: Ability to manage health-related needs will improve Outcome: Adequate for Discharge   Problem: Clinical Measurements: Goal: Ability to maintain clinical measurements within normal limits will improve Outcome: Adequate for Discharge Goal: Will remain free from infection Outcome: Adequate for Discharge Goal: Diagnostic test results will improve Outcome: Adequate for Discharge Goal: Respiratory complications will improve Outcome: Adequate for Discharge Goal: Cardiovascular complication will be avoided Outcome: Adequate for Discharge   Problem: Activity: Goal: Risk for activity intolerance will decrease Outcome: Adequate for Discharge   Problem: Nutrition: Goal: Adequate nutrition will be maintained Outcome: Adequate for Discharge   Problem: Coping: Goal: Level of anxiety will decrease Outcome: Adequate for Discharge   Problem: Elimination: Goal: Will not experience complications related to bowel motility Outcome: Adequate for Discharge Goal: Will not experience complications related to urinary retention Outcome: Adequate for Discharge   Problem: Pain Managment: Goal: General experience of comfort will improve and/or be controlled Outcome: Adequate for Discharge   Problem: Safety: Goal: Ability to remain free from injury will improve Outcome: Adequate for Discharge   Problem: Skin Integrity: Goal: Risk for impaired skin integrity will decrease Outcome: Adequate for Discharge

## 2023-07-10 NOTE — Plan of Care (Signed)
   Problem: Elimination: Goal: Will not experience complications related to bowel motility Outcome: Progressing   Problem: Pain Managment: Goal: General experience of comfort will improve and/or be controlled Outcome: Progressing   Problem: Safety: Goal: Ability to remain free from injury will improve Outcome: Progressing

## 2023-07-10 NOTE — Discharge Summary (Signed)
 Physician Discharge Summary  [name] [name] ZOX:[id number] DOB: [birth year] DOA: 07/05/2023  PCP: Dondra Fuel, MD  Admit date: 07/05/2023 Discharge date: 07/11/2023 Discharging to: Home Recommendations for Outpatient Follow-up:  Follow-up with Dr. Winferd Hatter for further management  Consults:  Phone discussion with neurology   Discharge Diagnoses:   Principal Problem:   Acute metabolic encephalopathy Active Problems:   Parkinson's disease (HCC)   Orthostatic hypotension   Nephrolithiasis   Anemia   Hypophonia     Brief hospital course: 78 year old male with Parkinson's disease and orthostatic hypotension was brought to the hospital for confusion. In the ED, UA was concerning for UTI.  He was noted to be oriented to name and place.  He was admitted for treatment for acute encephalopathy and a possible UTI and ceftriaxone  was started.  Further workup for encephalopathy was ordered     Assessment and Plan: Principal Problem:   Acute metabolic encephalopathy - Initially felt to be a UTI however the urine culture ruled out a UTI - CT of the head reveals deep brain stimulator leads in place without any other abnormality - Vitamin B12 176-given subcutaneous B12 on 6/4 and started on oral -Appears to be improving in the hospital - Discussed with neurology> No further workup, neurology recommended outpatient follow-up with his primary neurologist Dr. Winferd Hatter this week-he last saw Dr. Winferd Hatter - Xanax  is a PRN home med- I have discontinued this   Active Problems:      Recurrent falls in the setting of Parkinson's disease and orthostatic hypotension -   Florinef  3 times daily continued - hydrated with IV fluids and added TED hose - Reminded patient, daughter and his wife that they need to ensure he is drinking a sufficient amount of noncaffeinated liquids a day to prevent dehydration- at least 2 L a day -As mentioned above, case discussed with neurology and they recommended that he follow-up with his  outpatient neurologist, Dr. Winferd Hatter - Sinemet  dose has not been changed  - SNF recommended-he and his wife would like him to go home-his family will be available to support him in the [place] will provide him with more home health services       UTI (urinary tract infection)? - Initially felt to have a UTI however, culture showing less than 10,000 colonies -DC'd antibiotics  Severe hypophonia          Discharge Instructions  Discharge Instructions     Diet general   Complete by: As directed    Please drink 2-3 liters of water  a day.   Increase activity slowly   Complete by: As directed       Allergies as of 07/10/2023       Reactions   Rivastigmine Tartrate Nausea And Vomiting    Can tolerate the patch but can not tolerate the pills   Tamsulosin Other (See Comments)   Severe joint pain   Alfuzosin     Other reaction(s): Other (See Comments) hypotension        Medication List     STOP taking these medications    ALPRAZolam  0.25 MG tablet Commonly known as: XANAX        TAKE these medications    calcium  carbonate 500 MG chewable tablet Commonly known as: TUMS - dosed in mg elemental calcium  Chew 1 tablet by mouth daily as needed for indigestion or heartburn.   carbidopa -levodopa  50-200 MG tablet Commonly known as: SINEMET  CR Take 1 tablet by mouth at bedtime. What changed: Another medication with the same  name was changed. Make sure you understand how and when to take each.   carbidopa -levodopa  25-100 MG tablet Commonly known as: SINEMET  IR 2-2.5 tablets 4 times per day at 7am/10:30am/2pm/5:30pm What changed: additional instructions   cyanocobalamin  1000 MCG tablet Take 1 tablet (1,000 mcg total) by mouth daily.   fludrocortisone  0.1 MG tablet Commonly known as: FLORINEF  Take 2 tablets (0.2 mg total) by mouth 3 (three) times daily with meals. What changed: when to take this   potassium chloride  SA 20 MEQ tablet Commonly known as: KLOR-CON  M Take 1 tablet  (20 mEq total) by mouth daily.        Follow-up Information     [facility], [facility] Follow up.   Specialty: Home Health Services Why: Please follow up with this provider for home health PT, OT, RN, and Aide services upon discharge. Contact information: [address] [place] [address] [phone]                    The results of significant diagnostics from this hospitalization (including imaging, microbiology, ancillary and laboratory) are listed below for reference.    DG Abd 1 View Result Date: 07/06/2023 EXAM: 1 VIEW XRAY OF THE ABDOMEN SUPINE 07/06/2023 02:14:00 AM COMPARISON: None available. CLINICAL HISTORY: [id number] Constipation [id number]. Constipation Constipation FINDINGS: BOWEL: Moderate colonic stool burden suggesting mild constipation. PERITONEUM AND SOFT TISSUES: No abnormal calcifications. BONES: Mild degenerative changes of the lower lumbar spine. No acute osseous abnormality. IMPRESSION: 1. Moderate colonic stool burden, suggesting mild constipation. Electronically signed by: Zadie Herter MD 07/06/2023 02:31 AM EDT RP Workstation: [id number]   CT Lumbar Spine Wo Contrast Result Date: 07/05/2023 CLINICAL DATA:  Back trauma, no prior imaging (Age >= 16y) EXAM: CT LUMBAR SPINE WITHOUT CONTRAST TECHNIQUE: Multidetector CT imaging of the lumbar spine was performed without intravenous contrast administration. Multiplanar CT image reconstructions were also generated. RADIATION DOSE REDUCTION: This exam was performed according to the departmental dose-optimization program which includes automated exposure control, adjustment of the mA and/or kV according to patient size and/or use of iterative reconstruction technique. COMPARISON:  None Available. 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 Rudimentary disc space noted at L5-S1. Alignment: Normal. Vertebrae: No acute fracture or focal pathologic process. Paraspinal  and other soft tissues: Negative. Disc levels: Intervertebral disc heights are preserved. Diffuse mild endplate remodeling is seen throughout the lumbar spine in keeping with changes of mild diffuse degenerative disc disease. The pedicles are congenitally shortened throughout the visualized thoracolumbar spine contributing to findings noted below. Axial images demonstrate: T12-L1: Moderate bilateral neuroforaminal narrowing no significant canal stenosis. L1-2: Mild right and moderate left neuroforaminal narrowing. Broad-based disc bulge in combination with facet hypertrophy results in severe central canal stenosis within the sub foraminal zone. Mild bilateral facet arthrosis. Narrowing of the lateral recesses and probable impingement of the crossing L2 nerve roots bilaterally. L2-3: Mild to moderate bilateral neuroforaminal narrowing. Broad-based disc bulge in combination with facet hypertrophy results in severe central canal stenosis within the sub foraminal zone. Mild bilateral facet arthrosis. Narrowing of the lateral recess bilaterally noted with possible impingement of the crossing L3 nerve roots. L3-4: Mild bilateral neuroforaminal narrowing. Broad-based disc bulge in combination facet hypertrophy results in mild-to-moderate central stenosis within the sub foraminal zone. Mild facet arthrosis. No significant impingement of the recesses. L4-5: Severe left and moderate to severe right neuroforaminal narrowing probable abutment of the exiting left L4 nerve root. Broad-based disc bulge in combination with  facet hypertrophy results in moderate central canal stenosis within the sub foraminal zone. Impingement of lateral recesses bilaterally may result impingement the crossing L5 nerve roots bilaterally. Moderate bilateral facet arthrosis. L5-S1 unremarkable IMPRESSION: 1. 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 Rudimentary disc space noted at L5-S1. 2. Multilevel degenerative changes of the lumbar  spine as described. Of note, there is severe central canal stenosis at L1-2 and L2-3 with probable impingement of the crossing L2, L3 and L5 nerve roots bilaterally. 3. Multilevel neuroforaminal narrowing as above. 4. Of note, there is severe left and moderate to severe right neuroforaminal narrowing at L4-5 with probable abutment of the exiting left L4 nerve root. Electronically Signed   By: Worthy Heads M.D.   On: 07/05/2023 22:16   CT Head Wo Contrast Result Date: 07/05/2023 CLINICAL DATA:  Increased confusion EXAM: CT HEAD WITHOUT CONTRAST CT CERVICAL SPINE WITHOUT CONTRAST TECHNIQUE: Multidetector CT imaging of the head and cervical spine was performed following the standard protocol without intravenous contrast. Multiplanar CT image reconstructions of the cervical spine were also generated. RADIATION DOSE REDUCTION: This exam was performed according to the departmental dose-optimization program which includes automated exposure control, adjustment of the mA and/or kV according to patient size and/or use of iterative reconstruction technique. COMPARISON:  07/01/2023 FINDINGS: CT HEAD FINDINGS Brain: No evidence of acute infarction, hemorrhage, hydrocephalus, extra-axial collection or mass lesion/mass effect. Bilateral deep brain stimulator leads in generally expected vicinity of the subthalamic nucleus. Periventricular white matter hypodensity. Vascular: No hyperdense vessel or unexpected calcification. Skull: Normal. Negative for fracture or focal lesion. Sinuses/Orbits: No acute finding. Other: None. CT CERVICAL SPINE FINDINGS Alignment: Normal. Skull base and vertebrae: No acute fracture. No primary bone lesion or focal pathologic process. Soft tissues and spinal canal: No prevertebral fluid or swelling. No visible canal hematoma. Disc levels: Mild disc degenerative change of the lower cervical levels. Upper chest: Negative. Other: None. IMPRESSION: 1. No acute intracranial pathology. Small-vessel white  matter disease. 2. Bilateral deep brain stimulator leads. 3. No fracture or static subluxation of the cervical spine. Electronically Signed   By: Fredricka Jenny M.D.   On: 07/05/2023 21:59   CT Cervical Spine Wo Contrast Result Date: 07/05/2023 CLINICAL DATA:  Increased confusion EXAM: CT HEAD WITHOUT CONTRAST CT CERVICAL SPINE WITHOUT CONTRAST TECHNIQUE: Multidetector CT imaging of the head and cervical spine was performed following the standard protocol without intravenous contrast. Multiplanar CT image reconstructions of the cervical spine were also generated. RADIATION DOSE REDUCTION: This exam was performed according to the departmental dose-optimization program which includes automated exposure control, adjustment of the mA and/or kV according to patient size and/or use of iterative reconstruction technique. COMPARISON:  07/01/2023 FINDINGS: CT HEAD FINDINGS Brain: No evidence of acute infarction, hemorrhage, hydrocephalus, extra-axial collection or mass lesion/mass effect. Bilateral deep brain stimulator leads in generally expected vicinity of the subthalamic nucleus. Periventricular white matter hypodensity. Vascular: No hyperdense vessel or unexpected calcification. Skull: Normal. Negative for fracture or focal lesion. Sinuses/Orbits: No acute finding. Other: None. CT CERVICAL SPINE FINDINGS Alignment: Normal. Skull base and vertebrae: No acute fracture. No primary bone lesion or focal pathologic process. Soft tissues and spinal canal: No prevertebral fluid or swelling. No visible canal hematoma. Disc levels: Mild disc degenerative change of the lower cervical levels. Upper chest: Negative. Other: None. IMPRESSION: 1. No acute intracranial pathology. Small-vessel white matter disease. 2. Bilateral deep brain stimulator leads. 3. No fracture or static subluxation of the cervical spine. Electronically Signed   By: Fabio Holts  Allena Ard M.D.   On: 07/05/2023 21:59   DG Pelvis Portable Result Date:  07/05/2023 CLINICAL DATA:  Right hip pain after falls. EXAM: PORTABLE PELVIS 1-2 VIEWS COMPARISON:  Pelvis and right hip radiograph 07/01/2023 FINDINGS: No acute fracture of the pelvis or right hip. No evidence of healing fracture. No hip dislocation. Mild osteoarthritis unchanged from recent exam. Pubic rami are intact. Pubic symphysis and sacroiliac joints are congruent. Pelvic phleboliths and surgical clips. IMPRESSION: 1. No acute fracture of the pelvis or right hip. 2. Mild osteoarthritis. Electronically Signed   By: Chadwick Colonel M.D.   On: 07/05/2023 20:51   DG Hip Unilat W or Wo Pelvis 2-3 Views Right Result Date: 07/01/2023 CLINICAL DATA:  Right hip pain after fall. EXAM: DG HIP (WITH OR WITHOUT PELVIS) 2-3V RIGHT COMPARISON:  None Available. FINDINGS: No evidence of acute fracture of the pelvis or right hip. No hip dislocation. Pubic rami are intact. Pubic symphysis and sacroiliac joints are congruent. Mild bilateral degenerative spurring of the acetabula. IMPRESSION: No acute fracture or subluxation of the pelvis or right hip. Electronically Signed   By: Chadwick Colonel M.D.   On: 07/01/2023 11:17   CT HEAD WO CONTRAST ( ) Result Date: 07/01/2023 CLINICAL DATA:  Head trauma, minor, normal mental status. Neck trauma, midline tenderness. Fall. EXAM: CT HEAD WITHOUT CONTRAST CT CERVICAL SPINE WITHOUT CONTRAST TECHNIQUE: Multidetector CT imaging of the head and cervical spine was performed following the standard protocol without intravenous contrast. Multiplanar CT image reconstructions of the cervical spine were also generated. RADIATION DOSE REDUCTION: This exam was performed according to the departmental dose-optimization program which includes automated exposure control, adjustment of the mA and/or kV according to patient size and/or use of iterative reconstruction technique. COMPARISON:  Head CT 12/15/2012 FINDINGS: CT HEAD FINDINGS Brain: There is no evidence of an acute infarct, intracranial  hemorrhage, mass, midline shift, or extra-axial fluid collection. The brain stimulators terminate in the subglenoid regions bilaterally, with the right-sided stimulator being new from the remote prior CT. Mild cerebral atrophy is within normal limits for age. Cerebral white matter hypodensities are nonspecific but compatible with mild chronic small vessel ischemic disease. Vascular: No hyperdense vessel or unexpected calcification. Skull: No acute fracture or suspicious lesion. Sinuses/Orbits: Visualized paranasal sinuses and mastoid air cells are clear. Bilateral cataract extraction. Other: None. CT CERVICAL SPINE FINDINGS Alignment: Normal. Skull base and vertebrae: No acute fracture or suspicious lesion. Soft tissues and spinal canal: No prevertebral fluid or swelling. No visible canal hematoma. Disc levels: Mild cervical spondylosis. Moderate facet arthrosis on the right at C2-3 and on the left at C3-4. Moderate left neural foraminal stenosis at C3-4. No evidence of high-grade spinal canal stenosis. Upper chest: The included lung apices are clear. Other: None. IMPRESSION: 1. No evidence of acute intracranial abnormality or cervical spine fracture. 2. Mild chronic small vessel ischemic disease. Electronically Signed   By: Aundra Lee M.D.   On: 07/01/2023 11:08   CT Cervical Spine Wo Contrast Result Date: 07/01/2023 CLINICAL DATA:  Head trauma, minor, normal mental status. Neck trauma, midline tenderness. Fall. EXAM: CT HEAD WITHOUT CONTRAST CT CERVICAL SPINE WITHOUT CONTRAST TECHNIQUE: Multidetector CT imaging of the head and cervical spine was performed following the standard protocol without intravenous contrast. Multiplanar CT image reconstructions of the cervical spine were also generated. RADIATION DOSE REDUCTION: This exam was performed according to the departmental dose-optimization program which includes automated exposure control, adjustment of the mA and/or kV according to patient size and/or use  of iterative reconstruction technique. COMPARISON:  Head CT 12/15/2012 FINDINGS: CT HEAD FINDINGS Brain: There is no evidence of an acute infarct, intracranial hemorrhage, mass, midline shift, or extra-axial fluid collection. The brain stimulators terminate in the subglenoid regions bilaterally, with the right-sided stimulator being new from the remote prior CT. Mild cerebral atrophy is within normal limits for age. Cerebral white matter hypodensities are nonspecific but compatible with mild chronic small vessel ischemic disease. Vascular: No hyperdense vessel or unexpected calcification. Skull: No acute fracture or suspicious lesion. Sinuses/Orbits: Visualized paranasal sinuses and mastoid air cells are clear. Bilateral cataract extraction. Other: None. CT CERVICAL SPINE FINDINGS Alignment: Normal. Skull base and vertebrae: No acute fracture or suspicious lesion. Soft tissues and spinal canal: No prevertebral fluid or swelling. No visible canal hematoma. Disc levels: Mild cervical spondylosis. Moderate facet arthrosis on the right at C2-3 and on the left at C3-4. Moderate left neural foraminal stenosis at C3-4. No evidence of high-grade spinal canal stenosis. Upper chest: The included lung apices are clear. Other: None. IMPRESSION: 1. No evidence of acute intracranial abnormality or cervical spine fracture. 2. Mild chronic small vessel ischemic disease. Electronically Signed   By: Aundra Lee M.D.   On: 07/01/2023 11:08   Labs:   Basic Metabolic Panel: Recent Labs  Lab 07/05/23 1935 07/06/23 0516  NA 135 135  K 4.1 3.5  CL 104 103  CO2 24 26  GLUCOSE 110* 116*  BUN 41* 39*  CREATININE 1.14 0.96  CALCIUM  9.7 9.1     CBC: Recent Labs  Lab 07/05/23 1935 07/06/23 0516  WBC 8.6 5.8  NEUTROABS  --  3.7  HGB 12.9* 12.0*  HCT 38.9* 36.3*  MCV 96.3 97.3  PLT 301 263         SIGNED:   Sedalia Dacosta, MD  [facility] 07/11/2023, 5:33 PM Time taking on discharge: 50 minutes

## 2023-07-10 NOTE — Progress Notes (Signed)
 Physical Therapy Treatment Patient Details Name: [name] [name] MRN: [id number] DOB: 03/05/1945 Today's Date: 07/10/2023   History of Present Illness 78 yo male presents to therapy following presentation to ED on 07/05/2023 due to increased confusion over the past few days following a fall 5/29. Pt is having difficulty following commands and c/o R hip pain imaging negative fro acute findings on 5/29. Pt dx with acute encephalopathy and suspect UTI. Pt PMH includes but is not limited to: PD with deep brain stimulator, orthostatic hypotension, agent orange exposure, anemia, depression, falls, GERD, kidney stones, and HOH.    PT Comments  Pt received supine in bed agreeable to be seen, requesting [facility], wife and daughter present. Pt required CGA for increased time with HOB elevated for bed mobility, CGA for STS and SPT with RW for increased time, festination of gait, tactile cues for hand placement. Provided education and demonstration of transfers to wife, wife provided return demonstration with PT supervision; daugther did as well. Recommended use of RW or PD walker at home as able. Wife is a fall risk, had two overt LOB during session but able to self-correct without assistance, daughter noted this outside of room during discussion with PT; son is coming to stay with pt for a few days to provide additional assistance. All education completed and questions answered. Pt is prepping for discharge, we will continue to follow acutely should pt remain in acute care.    If plan is discharge home, recommend the following: A lot of help with bathing/dressing/bathroom;Assistance with cooking/housework;Assist for transportation;Help with stairs or ramp for entrance;A lot of help with walking and/or transfers   Can travel by private vehicle     No  Equipment Recommendations  None recommended by PT    Recommendations for Other Services       Precautions / Restrictions Precautions Precautions: Fall Recall of  Precautions/Restrictions: Intact Precaution/Restrictions Comments: orthostatic-improving Restrictions Weight Bearing Restrictions Per Provider Order: No     Mobility  Bed Mobility Overal bed mobility: Needs Assistance Bed Mobility: Supine to Sit     Supine to sit: HOB elevated, Contact guard, Used rails Sit to supine: Contact guard assist   General bed mobility comments: CGA for safety, increased time    Transfers Overall transfer level: Needs assistance Equipment used: Rolling walker (2 wheels) Transfers: Sit to/from Stand Sit to Stand: Contact guard assist           General transfer comment: CGA from bed and BSC, multimodal cuing. Multiple STS and SPT to [facility] and back.    Ambulation/Gait                   Stairs             Wheelchair Mobility     Tilt Bed    Modified Rankin (Stroke Patients Only)       Balance Overall balance assessment: Needs assistance, History of Falls Sitting-balance support: Feet supported Sitting balance-Leahy Scale: Good Sitting balance - Comments: Tolerated sitting 5 mins EOB. Minimal lightheadedness.   Standing balance support: Bilateral upper extremity supported, During functional activity, Reliant on assistive device for balance Standing balance-Leahy Scale: Poor Standing balance comment: High fall risk due to decreased motor control                            Communication Communication Communication: Impaired Factors Affecting Communication: Reduced clarity of speech;Other (comment) (Very soft spoken)  Cognition Arousal: Alert Behavior  During Therapy: WFL for tasks assessed/performed   PT - Cognitive impairments: No apparent impairments                         Following commands: Intact      Cueing Cueing Techniques: Verbal cues  Exercises Other Exercises Other Exercises: Wife and daughter educated on lift technique, use of gait belt, use of AD, with demonstration and return  demonstration    General Comments        Pertinent Vitals/Pain Pain Assessment Pain Assessment: No/denies pain Pain Score: 0-No pain Breathing: normal Negative Vocalization: none Facial Expression: smiling or inexpressive Body Language: relaxed Consolability: no need to console PAINAD Score: 0 Pain Location: Right hip Pain Descriptors / Indicators: Sore    Home Living                          Prior Function            PT Goals (current goals can now be found in the care plan section) Acute Rehab PT Goals Patient Stated Goal: to not feel like I am going to fall and blackout PT Goal Formulation: With patient Time For Goal Achievement: 07/20/23 Potential to Achieve Goals: Good Progress towards PT goals: Progressing toward goals    Frequency    Min 3X/week      PT Plan      Co-evaluation              AM-PAC PT "6 Clicks" Mobility   Outcome Measure  Help needed turning from your back to your side while in a flat bed without using bedrails?: A Little Help needed moving from lying on your back to sitting on the side of a flat bed without using bedrails?: A Little Help needed moving to and from a bed to a chair (including a wheelchair)?: A Little Help needed standing up from a chair using your arms (e.g., wheelchair or bedside chair)?: A Little Help needed to walk in hospital room?: A Little Help needed climbing 3-5 steps with a railing? : Total 6 Click Score: 16    End of Session Equipment Utilized During Treatment: Gait belt Activity Tolerance: Patient tolerated treatment well;No increased pain Patient left: with call bell/phone within reach;in bed;with family/visitor present;with nursing/sitter in room Nurse Communication: Mobility status PT Visit Diagnosis: Unsteadiness on feet (R26.81);Other abnormalities of gait and mobility (R26.89);Repeated falls (R29.6);Muscle weakness (generalized) (M62.81);Difficulty in walking, not elsewhere classified  (R26.2)     Time: 1400-1435 PT Time Calculation (min) (ACUTE ONLY): 35 min  Charges:    $Therapeutic Activity: 23-37 mins PT General Charges $$ ACUTE PT VISIT: 1 Visit                     Jerrye Mori, PT, DPT [facility] Department Office: [phone]   Jerrye Mori 07/10/2023, 2:37 PM

## 2023-07-10 NOTE — TOC Transition Note (Signed)
 Transition of Care [facility]) - Discharge Note   Patient Details  Name: [name] [name] MRN: [id number] Date of Birth: 01-Nov-1945  Transition of Care [facility]) CM/SW Contact:  Jonni Nettle, LCSW Phone Number: 07/10/2023, 12:37 PM   Clinical Narrative:    Pt to discharge home with [facility] PT/OT/RN/Aide services through [place]. CSW spoke with Cindie at [place] to confirm d/c plan. PTAR to transport pt home.   Final next level of care: Home w Home Health Services Barriers to Discharge: Barriers Resolved   Patient Goals and CMS Choice Patient states their goals for this hospitalization and ongoing recovery are:: To return home    Discharge Placement Home with [facility] services  Discharge Plan and Services Additional resources added to the After Visit Summary for [facility] services                DME Arranged: N/A DME Agency: NA       HH Arranged: RN, Nurse's Aide, PT, OT HH Agency: [facility] Date [facility] Agency Contacted: 07/10/23 Time HH Agency Contacted: [phone] Representative spoke with at [facility] Agency: Cindie at [place]  Social Drivers of Health (SDOH) Interventions SDOH Screenings   Food Insecurity: No Food Insecurity (07/06/2023)  Housing: Low Risk  (07/06/2023)  Transportation Needs: No Transportation Needs (07/06/2023)  Utilities: Not At Risk (07/06/2023)  Social Connections: Moderately Isolated (07/06/2023)  Tobacco Use: Low Risk  (07/06/2023)     Readmission Risk Interventions    07/10/2023   12:35 PM  Readmission Risk Prevention Plan  Post Dischage Appt Complete  Medication Screening Complete  Transportation Screening Complete

## 2023-07-11 DIAGNOSIS — R498 Other voice and resonance disorders: Secondary | ICD-10-CM | POA: Insufficient documentation

## 2023-07-12 ENCOUNTER — Encounter (HOSPITAL_COMMUNITY): Payer: Self-pay

## 2023-07-12 ENCOUNTER — Encounter (HOSPITAL_COMMUNITY)
Admission: RE | Admit: 2023-07-12 | Discharge: 2023-07-12 | Disposition: A | Discharge status: Home / Self Care | Source: Ambulatory Visit | Attending: Urology | Admitting: Urology

## 2023-07-12 ENCOUNTER — Other Ambulatory Visit: Payer: Self-pay

## 2023-07-12 HISTORY — DX: Anemia, unspecified: D64.9

## 2023-07-12 NOTE — Progress Notes (Addendum)
 PCP - Caren Thurston Flow  [facility] Cardiologist - Florian Hurt  LOV 2022   Also sees cardiology at [facility] unsure of  name. Neuro- Dr. Ivette Marks Tat  LOV 04-06-23 epic  PPM/ICD -  Device Orders -  Rep Notified -   Chest x-ray -  EKG - 07-05-23 epic Stress Test - 2022 epic ECHO -  Cardiac Cath -   Sleep Study -  CPAP -   Fasting Blood Sugar -  Checks Blood Sugar __n/a___ times a day  Blood Thinner Instructions:no Aspirin Instructions:no  ERAS Protcol - PRE-SURGERY Ensure or G2-    COVID vaccine -yes  Activity--wheel chair needs assistance standing. Normally walks in home with Walker no CP or SOB  Anesthesia review: Parkinson's, recent hospital admission, anemia, see EKG, Hx hypotension,  BIL Deep brain stimulator  instructed to bring remote  Patient denies shortness of breath, fever, cough and chest pain at PAT appointment   All instructions explained to the patient, with a verbal understanding of the material. Patient agrees to go over the instructions while at home for a better understanding. Patient also instructed to self quarantine after being tested for COVID-19. The opportunity to ask questions was provided.

## 2023-07-14 ENCOUNTER — Ambulatory Visit (HOSPITAL_COMMUNITY)

## 2023-07-14 ENCOUNTER — Ambulatory Visit (HOSPITAL_COMMUNITY): Admitting: Physician Assistant

## 2023-07-14 ENCOUNTER — Ambulatory Visit (HOSPITAL_COMMUNITY): Admitting: Certified Registered Nurse Anesthetist

## 2023-07-14 ENCOUNTER — Encounter (HOSPITAL_COMMUNITY): Payer: Self-pay | Admitting: Urology

## 2023-07-14 ENCOUNTER — Encounter (HOSPITAL_COMMUNITY)
Admission: RE | Disposition: A | Discharge status: Home / Self Care | Payer: Self-pay | Source: Home / Self Care | Attending: Urology

## 2023-07-14 ENCOUNTER — Ambulatory Visit (HOSPITAL_COMMUNITY)
Admission: RE | Admit: 2023-07-14 | Discharge: 2023-07-14 | Disposition: A | Discharge status: Home / Self Care | Attending: Urology | Admitting: Urology

## 2023-07-14 DIAGNOSIS — Z87442 Personal history of urinary calculi: Secondary | ICD-10-CM | POA: Diagnosis not present

## 2023-07-14 DIAGNOSIS — N200 Calculus of kidney: Secondary | ICD-10-CM

## 2023-07-14 DIAGNOSIS — N132 Hydronephrosis with renal and ureteral calculous obstruction: Secondary | ICD-10-CM | POA: Insufficient documentation

## 2023-07-14 DIAGNOSIS — G20A1 Parkinson's disease without dyskinesia, without mention of fluctuations: Secondary | ICD-10-CM

## 2023-07-14 DIAGNOSIS — A419 Sepsis, unspecified organism: Secondary | ICD-10-CM

## 2023-07-14 DIAGNOSIS — I10 Essential (primary) hypertension: Secondary | ICD-10-CM

## 2023-07-14 DIAGNOSIS — N210 Calculus in bladder: Secondary | ICD-10-CM | POA: Insufficient documentation

## 2023-07-14 DIAGNOSIS — N201 Calculus of ureter: Secondary | ICD-10-CM | POA: Diagnosis present

## 2023-07-14 HISTORY — PX: CYSTOSCOPY/RETROGRADE/URETEROSCOPY/STONE EXTRACTION WITH BASKET: SHX5317

## 2023-07-14 HISTORY — PX: CYSTOSCOPY/URETEROSCOPY/HOLMIUM LASER/STENT PLACEMENT: SHX6546

## 2023-07-14 HISTORY — PX: CYSTOSCOPY WITH LITHOLAPAXY: SHX1425

## 2023-07-14 SURGERY — CYSTOSCOPY, WITH BLADDER CALCULUS LITHOLAPAXY
Anesthesia: General | Laterality: Right

## 2023-07-14 MED ORDER — ARTIFICIAL TEARS OPHTHALMIC OINT
TOPICAL_OINTMENT | OPHTHALMIC | Status: AC
  Filled 2023-07-14: qty 3.5

## 2023-07-14 MED ORDER — LACTATED RINGERS IV SOLN
INTRAVENOUS | Status: DC
Start: 2023-07-14 — End: 2023-07-14

## 2023-07-14 MED ORDER — PROPOFOL 10 MG/ML IV BOLUS
INTRAVENOUS | Status: DC | PRN
  Administered 2023-07-14: 130 mg via INTRAVENOUS

## 2023-07-14 MED ORDER — LIDOCAINE HCL (CARDIAC) PF 100 MG/5ML IV SOSY
PREFILLED_SYRINGE | INTRAVENOUS | Status: DC | PRN
  Administered 2023-07-14: 60 mg via INTRAVENOUS

## 2023-07-14 MED ORDER — ORAL CARE MOUTH RINSE: 15.0000 mL | Freq: Once | OROMUCOSAL | Status: AC

## 2023-07-14 MED ORDER — CIPROFLOXACIN HCL 500 MG PO TABS: 500.0000 mg | ORAL_TABLET | Freq: Once | ORAL | 0 refills | Status: AC

## 2023-07-14 MED ORDER — PHENYLEPHRINE 80 MCG/ML (10ML) SYRINGE FOR IV PUSH (FOR BLOOD PRESSURE SUPPORT)
PREFILLED_SYRINGE | INTRAVENOUS | Status: DC | PRN
  Administered 2023-07-14: 80 ug via INTRAVENOUS
  Administered 2023-07-14: 120 ug via INTRAVENOUS
  Administered 2023-07-14: 40 ug via INTRAVENOUS
  Administered 2023-07-14 (×4): 80 ug via INTRAVENOUS

## 2023-07-14 MED ORDER — ONDANSETRON HCL 4 MG/2ML IJ SOLN: 4.0000 mg | Freq: Once | INTRAMUSCULAR | Status: DC | PRN

## 2023-07-14 MED ORDER — FENTANYL CITRATE (PF) 100 MCG/2ML IJ SOLN
INTRAMUSCULAR | Status: AC
Start: 2023-07-14 — End: 2023-07-14
  Filled 2023-07-14: qty 2

## 2023-07-14 MED ORDER — FENTANYL CITRATE (PF) 100 MCG/2ML IJ SOLN
INTRAMUSCULAR | Status: DC | PRN
  Administered 2023-07-14: 25 ug via INTRAVENOUS
  Administered 2023-07-14: 50 ug via INTRAVENOUS
  Administered 2023-07-14: 25 ug via INTRAVENOUS

## 2023-07-14 MED ORDER — ROCURONIUM BROMIDE 10 MG/ML (PF) SYRINGE
PREFILLED_SYRINGE | INTRAVENOUS | Status: AC
  Filled 2023-07-14: qty 10

## 2023-07-14 MED ORDER — CHLORHEXIDINE GLUCONATE 0.12 % MT SOLN
15.0000 mL | Freq: Once | OROMUCOSAL | Status: AC
Start: 2023-07-14 — End: 2023-07-14
  Administered 2023-07-14: 15 mL via OROMUCOSAL

## 2023-07-14 MED ORDER — PROPOFOL 10 MG/ML IV BOLUS
INTRAVENOUS | Status: AC
  Filled 2023-07-14: qty 20

## 2023-07-14 MED ORDER — DEXAMETHASONE SODIUM PHOSPHATE 10 MG/ML IJ SOLN
INTRAMUSCULAR | Status: DC | PRN
  Administered 2023-07-14: 5 mg via INTRAVENOUS

## 2023-07-14 MED ORDER — FENTANYL CITRATE PF 50 MCG/ML IJ SOSY: 25.0000 ug | PREFILLED_SYRINGE | INTRAMUSCULAR | Status: DC | PRN

## 2023-07-14 MED ORDER — ACETAMINOPHEN 500 MG PO TABS
1000.0000 mg | ORAL_TABLET | Freq: Once | ORAL | Status: AC
  Administered 2023-07-14: 1000 mg via ORAL
  Filled 2023-07-14: qty 2

## 2023-07-14 MED ORDER — LIDOCAINE 2% (20 MG/ML) 5 ML SYRINGE: INTRAMUSCULAR | Status: DC | PRN

## 2023-07-14 MED ORDER — SODIUM CHLORIDE 0.9 % IR SOLN
Status: DC | PRN
  Administered 2023-07-14: 6000 mL via INTRAVESICAL

## 2023-07-14 MED ORDER — ONDANSETRON HCL 4 MG/2ML IJ SOLN
INTRAMUSCULAR | Status: DC | PRN
  Administered 2023-07-14: 4 mg via INTRAVENOUS

## 2023-07-14 MED ORDER — ONDANSETRON HCL 4 MG/2ML IJ SOLN
INTRAMUSCULAR | Status: AC
  Filled 2023-07-14: qty 2

## 2023-07-14 MED ORDER — DEXAMETHASONE SODIUM PHOSPHATE 10 MG/ML IJ SOLN
INTRAMUSCULAR | Status: AC
  Filled 2023-07-14: qty 1

## 2023-07-14 MED ORDER — CEFAZOLIN SODIUM-DEXTROSE 2-4 GM/100ML-% IV SOLN
2.0000 g | INTRAVENOUS | Status: AC
  Administered 2023-07-14: 2 g via INTRAVENOUS
  Filled 2023-07-14: qty 100

## 2023-07-14 MED ORDER — IOHEXOL 300 MG/ML  SOLN
INTRAMUSCULAR | Status: DC | PRN
Start: 2023-07-14 — End: 2023-07-14
  Administered 2023-07-14: 17 mL via URETHRAL

## 2023-07-14 SURGICAL SUPPLY — 20 items
BAG URINE DRAIN 2000ML AR STRL (UROLOGICAL SUPPLIES) IMPLANT
BAG URO CATCHER STRL LF (MISCELLANEOUS) ×1 IMPLANT
BASKET ZERO TIP NITINOL 2.4FR (BASKET) IMPLANT
CATH FOLEY 2WAY SLVR 5CC 20FR (CATHETERS) IMPLANT
CATH URETL OPEN 5X70 (CATHETERS) ×1 IMPLANT
CLOTH BEACON ORANGE TIMEOUT ST (SAFETY) ×1 IMPLANT
EXTRACTOR STONE NITINOL NGAGE (UROLOGICAL SUPPLIES) IMPLANT
GLOVE SURG LX STRL 7.5 STRW (GLOVE) ×1 IMPLANT
GOWN STRL REUS W/ TWL XL LVL3 (GOWN DISPOSABLE) ×1 IMPLANT
GUIDEWIRE ANG ZIPWIRE 038X150 (WIRE) IMPLANT
GUIDEWIRE STR DUAL SENSOR (WIRE) ×1 IMPLANT
KIT TURNOVER KIT A (KITS) IMPLANT
MANIFOLD NEPTUNE II (INSTRUMENTS) ×1 IMPLANT
PACK CYSTO (CUSTOM PROCEDURE TRAY) ×1 IMPLANT
SHEATH NAVIGATOR HD 12/14X36 (SHEATH) IMPLANT
STENT URET 6FRX26 CONTOUR (STENTS) IMPLANT
SYRINGE TOOMEY IRRIG 70ML (MISCELLANEOUS) IMPLANT
TRACTIP FLEXIVA PULS ID 200XHI (Laser) IMPLANT
TUBING CONNECTING 10 (TUBING) ×1 IMPLANT
TUBING UROLOGY SET (TUBING) ×1 IMPLANT

## 2023-07-14 NOTE — Anesthesia Procedure Notes (Signed)
 Procedure Name: LMA Insertion Date/Time: 07/14/2023 8:48 AM  Performed by: Ezzie Holstein, CRNAPre-anesthesia Checklist: Patient identified, Emergency Drugs available, Suction available and Patient being monitored Patient Re-evaluated:Patient Re-evaluated prior to induction Oxygen  Delivery Method: Circle System Utilized Preoxygenation: Pre-oxygenation with 100% oxygen  Induction Type: IV induction Ventilation: Mask ventilation without difficulty LMA: LMA inserted LMA Size: 4.0 Number of attempts: 1 Placement Confirmation: positive ETCO2 Tube secured with: Tape Dental Injury: Teeth and Oropharynx as per pre-operative assessment

## 2023-07-14 NOTE — Transfer of Care (Signed)
 Immediate Anesthesia Transfer of Care Note  Patient: [name] [name]  Procedure(s) Performed: CYSTOSCOPY, WITH BLADDER CALCULUS LITHOLAPAXY (Right) CYSTOSCOPY/URETEROSCOPY/HOLMIUM LASER/STENT PLACEMENT (Right) CYSTOSCOPY, WITH CALCULUS REMOVAL USING BASKET (Right)  Patient Location: PACU  Anesthesia Type:General  Level of Consciousness: awake and patient cooperative  Airway & Oxygen  Therapy: Patient Spontanous Breathing  Post-op Assessment: Report given to RN and Post -op Vital signs reviewed and stable  Post vital signs: Reviewed and stable  Last Vitals:  Vitals Value Taken Time  BP 161/87 07/14/23 1030  Temp    Pulse 69 07/14/23 1033  Resp 14 07/14/23 1033  SpO2 99 % 07/14/23 1033  Vitals shown include unfiled device data.  Last Pain:  Vitals:   07/14/23 0726  TempSrc:   PainSc: 3          Complications: No notable events documented.

## 2023-07-14 NOTE — Anesthesia Preprocedure Evaluation (Addendum)
 Anesthesia Evaluation  Patient identified by MRN, date of birth, ID band Patient awake    Reviewed: Allergy & Precautions, H&P , NPO status , Patient's Chart, lab work & pertinent test results  Airway Mallampati: IV  TM Distance: >3 FB Neck ROM: Full    Dental  (+) Dental Advisory Given, Teeth Intact   Pulmonary neg pulmonary ROS   Pulmonary exam normal breath sounds clear to auscultation       Cardiovascular hypertension (141/79 preop, no home meds), Normal cardiovascular exam Rhythm:Regular Rate:Normal     Neuro/Psych  PSYCHIATRIC DISORDERS  Depression    Parkinsons s/p DBS     GI/Hepatic Neg liver ROS,GERD  Controlled,,  Endo/Other  negative endocrine ROS    Renal/GU negative Renal ROS  negative genitourinary   Musculoskeletal negative musculoskeletal ROS (+)    Abdominal   Peds negative pediatric ROS (+)  Hematology Hb 12, plt 263   Anesthesia Other Findings   Reproductive/Obstetrics negative OB ROS                             Anesthesia Physical Anesthesia Plan  ASA: 3  Anesthesia Plan: General   Post-op Pain Management: Tylenol  PO (pre-op)*   Induction: Intravenous  PONV Risk Score and Plan: 2 and Ondansetron , Dexamethasone  and Treatment may vary due to age or medical condition  Airway Management Planned: LMA  Additional Equipment: None  Intra-op Plan:   Post-operative Plan: Extubation in OR  Informed Consent: I have reviewed the patients History and Physical, chart, labs and discussed the procedure including the risks, benefits and alternatives for the proposed anesthesia with the patient or authorized representative who has indicated his/her understanding and acceptance.     Dental advisory given  Plan Discussed with: CRNA  Anesthesia Plan Comments: (B/L DBS turned off)       Anesthesia Quick Evaluation

## 2023-07-14 NOTE — Discharge Instructions (Signed)
 DISCHARGE INSTRUCTIONS FOR KIDNEY STONE/URETERAL STENT   MEDICATIONS:  1.  Resume all your other meds from home - except do not take any extra narcotic pain meds that you may have at home.  2.  Take Cipro  one hour prior to removal of your stent.   ACTIVITY:  1. No strenuous activity x 1week  2. No driving while on narcotic pain medications  3. Drink plenty of water   4. Continue to walk at home - you can still get blood clots when you are at home, so keep active, but don't over do it.  5. May return to work/school tomorrow or when you feel ready   BATHING:  1. You can shower and we recommend daily showers  2. You have a string coming from your urethra: The stent string is attached to your ureteral stent. Do not pull on this.   SIGNS/SYMPTOMS TO CALL:  Please call us  if you have a fever greater than 101.5, uncontrolled nausea/vomiting, uncontrolled pain, dizziness, unable to urinate, bloody urine, chest pain, shortness of breath, leg swelling, leg pain, redness around wound, drainage from wound, or any other concerns or questions.   You can reach us  at [phone].   FOLLOW-UP:  1. You have an appointment in 6 weeks with a ultrasound of your kidneys prior.  2. You have a string attached to your stent, you may remove it on Monday, 07/19/23. To do this, pull the strings until the stents are completely removed. You may feel an odd sensation in your back.

## 2023-07-14 NOTE — H&P (Signed)
 cc: left flank pain   05/26/23: 78 year old man with longstanding history of urolithiasis comes in with several days of left lower quadrant pain. He has had a recent fall with left rib fractures. He developed nausea and feels like he is passing the stone. He recently passed a right ureteral stone. No fevers, chills, nausea or vomiting. Urinalysis does not appear infected.   Interval: Today the patient is here with a KUB and ultrasound prior. Over the last month he has had some left-sided flank pain that seems to be getting better, presumably related to his rib fractures. However, he has had some increased hematuria and some associated nausea. He is having occasional right flank pain.     ALLERGIES: Exelon CAPS Tamsulosin HCl CAPS    MEDICATIONS: Advil  TABS on hold  ALPRAZolam  0.25 MG Tablet 0 Oral PRN  Carbidopa -Levodopa  25-100 MG Tablet Oral  Fludrocortisone  Acetate 0.1 MG Tablet  Ondansetron  HCl 4 MG Tablet 1 tablet PO Q 8 H PRN  Rasagiline  Mesylate 1 MG Tablet  Tylenol      GU PSH: Complex Uroflow - 2019 Cysto Bladder Stone <2.5cm - 2019 Cysto Bladder Stone >2.5cm - 2022 Cysto Remove Stent FB Sim - 08/26/2022, 2022 Cysto Uretero Lithotripsy - 2016, 2014, 2012 Cystoscopy - 2019 Cystoscopy Insert Stent - 2016, 2014, 2012, 2012 Cystoscopy TURP - 2022 ESWL - 2012, 2012 Locm 300-399Mg /Ml Iodine,1Ml - 2022 Ureteroscopic laser litho - 2022 Ureteroscopic stone removal - 2012 UROLIFT - 2019       PSH Notes: Cystoscopy With Ureteroscopy With Lithotripsy, Cystoscopy With Insertion Of Ureteral Stent Left, Cystoscopy With Insertion Of Ureteral Stent Left, Cystoscopy With Ureteroscopy With Lithotripsy, Lithotripsy, Cystoscopy With Ureteroscopy With Lithotripsy, Cystoscopy With Insertion Of Ureteral Stent Left, Lithotripsy, Cystoscopy With Ureteroscopy With Manipulation Of Calculus, Cystoscopy With Insertion Of Ureteral Stent, Brain stimulator (05/2015)   NON-GU PSH: Visit Complexity (formerly  GPC1X) - 04/23/2023, 03/30/2023, 07/28/2022, 07/06/2022, 06/01/2022, 04/13/2022, 04/01/2022     GU PMH: Bladder Stone - 05/26/2023, - 04/23/2023, - 10/06/2022, - 04/13/2022, - 2022, - 2022, Bladder calculus, - 2014 LLQ pain (Stable) - 05/26/2023, Abdominal pain, LLQ (left lower quadrant), - 2016 Renal and ureteral calculus - 04/23/2023, - 03/30/2023, - 04/01/2022 Renal calculus - 10/06/2022, - 08/26/2022, - 07/28/2022, - 07/14/2022, - 07/06/2022, - 06/01/2022 (Stable), - 04/13/2022 (Stable), - 2019, Bilateral kidney stones, - 2016 Acute Cystitis/UTI - 09/09/2022 Dysuria - 07/28/2022, - 2023, - 2022 Gross hematuria - 07/28/2022, - 07/06/2022 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 Obstructive symptomatology with visiual (cysto) and flow evidence of obstruction, - 2019, - 2019 (Stable), On uroxatral  and recognizes significant improvement., - 2017 (Chronic), The patient's voiding symptoms have been stable while on Uroxatral . However, he does have some urge incontinence that has worsened over the past several years., - 2017, Benign localized prostatic hyperplasia with lower urinary tract symptoms (LUTS), - 2016 Urinary Tract Inf, Unspec site (Stable) - 04/01/2022, Urinary tract infection, - 2014 Hemorrhagic cystitis - 2023 Microscopic hematuria - 2022, - 2022 Nocturia - 2022, Nocturia, - 2016 Ureteral calculus - 2022, Calculus of left ureter, - 2016 Nocturnal Enuresis - 2022 Urinary Urgency (Stable) - 2021, (Improving), Treating his constipation in addition to his bladder symptoms has improved his urgency significantly., - 2017 Urinary Obstruction - 2019 Urinary Frequency (Improving), Myrbetriq 25mg  has made a bid difference for him. PVR is minimal today. - 2017 Urinary Retention, Unspec, Urinary retention -  2014 BPH w/o LUTS, Benign Prostatic Hypertrophy - 2014 Peyronies Disease, Peyronie's Disease  - 2014      PMH Notes: Patient has Parkinson's.   He has a history of Nephrolithiasis - had right URS by Dr. Levi Real in Aug 2016, non-obstructing stone in the right kidney from CT scan in May 2016.   The patient has a family history of prostate cancer. His father died of metastatic prostate cancer in his 40s.      NON-GU PMH: Bacteriuria - 07/14/2022 Calculus of gallbladder without cholecystitis without obstruction, Cholelithiasis - 2016 Nausea, Nausea - 2016 Encounter for general adult medical examination without abnormal findings, Encounter for preventive health examination - 2015 Anxiety, Anxiety (Symptom) - 2014 Parkinson's disease, Parkinson's Disease - 2014    FAMILY HISTORY: Brain Cancer - Father Father Deceased At Age26 ___ - Runs In Family Lung Cancer - Runs In Family Mother Deceased At Age 38 from diabetic complicati - Runs In Family rheumatoid arthritis - Mother Transient Ischemic Attack - Father   SOCIAL HISTORY: Marital Status: Married Preferred Language: English; Ethnicity: Not Hispanic Or Latino; Race: White Current Smoking Status: Patient has never smoked.   Tobacco Use Assessment Completed: Used Tobacco in last 30 days? Drinks 1 caffeinated drink per day.     Notes: Caffeine Use, Alcohol  Use, Never A Smoker, Marital History - Currently Married   REVIEW OF SYSTEMS:    GU Review Male:   Patient denies frequent urination, hard to postpone urination, burning/ pain with urination, get up at night to urinate, leakage of urine, stream starts and stops, trouble starting your stream, have to strain to urinate , erection problems, and penile pain.  Gastrointestinal (Upper):   Patient denies nausea, vomiting, and indigestion/ heartburn.  Gastrointestinal (Lower):   Patient denies diarrhea and constipation.  Constitutional:   Patient denies fever, night sweats, weight loss, and fatigue.  Skin:   Patient denies skin rash/ lesion and itching.  Eyes:   Patient denies blurred  vision and double vision.  Ears/ Nose/ Throat:   Patient denies sore throat and sinus problems.  Hematologic/Lymphatic:   Patient denies swollen glands and easy bruising.  Cardiovascular:   Patient denies leg swelling and chest pains.  Respiratory:   Patient denies cough and shortness of breath.  Endocrine:   Patient denies excessive thirst.  Musculoskeletal:   Patient denies back pain and joint pain.  Neurological:   Patient denies headaches and dizziness.  Psychologic:   Patient denies depression and anxiety.   VITAL SIGNS:      06/25/2023 03:56 PM  BP 145/79 mmHg  Pulse 69 /min   MULTI-SYSTEM PHYSICAL EXAMINATION:    Constitutional: Well-nourished. No physical deformities. Normally developed. Good grooming.  Respiratory: No labored breathing, no use of accessory muscles.   Cardiovascular: Regular rate and rhythm. No murmur, no gallop. Normal temperature, normal extremity pulses, no swelling, no varicosities.      Complexity of Data:  Source Of History:  Patient  Records Review:   Previous Doctor Records, Previous Hospital Records, Previous Patient Records, POC Tool  Urine Test Review:   Urinalysis  X-Ray Review: KUB: Reviewed Films. Discussed With Patient.  Renal Ultrasound: Reviewed Films. Discussed With Patient.  C.T. Abdomen/Pelvis: Reviewed Films. Discussed With Patient.     07/25/20 08/03/19 02/23/17 10/01/15 08/01/13 11/20/10  PSA  Total PSA 2.45 ng/mL 2.18 ng/mL 3.15 ng/mL 2.24 ng/dl 1.61  0.96     PROCEDURES:         KUB - 74018  A single  view of the abdomen is obtained. Renal shadows are easily visualized bilaterally. The patient has a large stone in the right upper pole as well as a medium size stone in the right UVJ. There is lots of little stones that are nonobstructing in the left kidney. There is a large bladder stone, but no associated areas along the ureter notable for stone. Gas pattern is grossly normal. No significant bony abnormalities.       Impression: Large bladder stone, 2 stones in the right kidney and lots of little stones in the left kidney.           Renal Ultrasound - 96045  Right kidney  Length: 11.28 cm Depth: 6.71 cm Cortical Width: 1.40 cm Width: 6.51 cm  Left Kidney Length: 11.47 cm Depth: 5.27 cm Cortical Width: 1.22 cm Width: 5.01 cm   Left Kidney/Ureter:  Multiple nonobstructing calcs, largest 0.9 cm  Right Kidney/Ureter:  Hydro; calc noted in prox ur 0.9 cm; multiple nonobstructing calcs  Bladder:  PVR 5.58 ml; stone 1.41 cm      Patient confirmed No Neulasta OnPro Device.           Urinalysis w/Scope - 81001 Dipstick Dipstick Cont'd Micro  Color: Yellow Bilirubin: Neg WBC/hpf: >60/hpf  Appearance: Slightly Cloudy Ketones: Neg RBC/hpf: 20 - 40/hpf  Specific Gravity: 1.025 Blood: Neg Bacteria: Few (10-25/hpf)  pH: 6.0 Protein: 1+ Cystals: Ca Oxalate  Glucose: Neg Urobilinogen: 0.2 Casts: NS (Not Seen)    Nitrites: Neg Trichomonas: Not Present    Leukocyte Esterase: 1+ Mucous: Not Present      Epithelial Cells: 0 - 5/hpf      Yeast: NS (Not Seen)      Sperm: Not Present    Notes:      ASSESSMENT:      ICD-10 Details  1 GU:   Bladder Stone - N21.0   2   Renal and ureteral calculus - N20.2    PLAN:           Document Letter(s):  Created for Patient: Clinical Summary         Notes:   The patient appears to have a right sided UPJ stone with associated hydronephrosis. He also has a large bladder stone. There is also a large nonobstructing stone in the right upper pole. The stones in the left are quite small and that kidney is atrophic.   We discussed management strategies. We discussed shockwave lithotripsy for the UPJ stone versus ureteroscopy and cystolitholapaxy. The patient is opted to proceed with ureteroscopy and cystolitholapaxy. Will try to clean out the right side of his kidney and urinary tract. Will try to get this scheduled soon as possible. The patient does have a deep brain  stimulator from [employer], and he would like us  to touch base with the rep regarding this.

## 2023-07-14 NOTE — Interval H&P Note (Signed)
 History and Physical Interval Note:  07/14/2023 8:39 AM  Darrol Emmer  has presented today for surgery, with the diagnosis of BLADDER STONE, RIGHT URETERAL STONE.  The various methods of treatment have been discussed with the patient and family. After consideration of risks, benefits and other options for treatment, the patient has consented to  Procedure(s) with comments: CYSTOSCOPY, WITH BLADDER CALCULUS LITHOLAPAXY (Right) - CYSTOLITHOLAPAXY, RIGHT URETEROSCOPY, LASER LITHOTRIPSY, STONE EXTRACTION, RIGHT URETERAL STENT PLACEMENT CYSTOSCOPY/URETEROSCOPY/HOLMIUM LASER/STENT PLACEMENT (Right) CYSTOSCOPY, WITH CALCULUS REMOVAL USING BASKET (Right) as a surgical intervention.  The patient's history has been reviewed, patient examined, no change in status, stable for surgery.  I have reviewed the patient's chart and labs.  Questions were answered to the patient's satisfaction.     Andrez Banker

## 2023-07-14 NOTE — Anesthesia Postprocedure Evaluation (Signed)
 Anesthesia Post Note  Patient: [name] [name]  Procedure(s) Performed: CYSTOSCOPY, WITH BLADDER CALCULUS LITHOLAPAXY (Right) CYSTOSCOPY/URETEROSCOPY/HOLMIUM LASER/STENT PLACEMENT (Right) CYSTOSCOPY, WITH CALCULUS REMOVAL USING BASKET (Right)     Patient location during evaluation: PACU Anesthesia Type: General Level of consciousness: awake and alert, oriented and patient cooperative Pain management: pain level controlled Vital Signs Assessment: post-procedure vital signs reviewed and stable Respiratory status: spontaneous breathing, nonlabored ventilation and respiratory function stable Cardiovascular status: blood pressure returned to baseline and stable Postop Assessment: no apparent nausea or vomiting Anesthetic complications: no   No notable events documented.  Last Vitals:  Vitals:   07/14/23 1115 07/14/23 1130  BP: (!) 168/84 (!) 163/80  Pulse: 79 78  Resp: 10 13  Temp:    SpO2: 96% 96%    Last Pain:  Vitals:   07/14/23 1130  TempSrc:   PainSc: 0-No pain                 Jacquelyne Matte

## 2023-07-14 NOTE — Telephone Encounter (Signed)
 Called and left message for patients wife to call back and give update on patient

## 2023-07-14 NOTE — Op Note (Signed)
 Preoperative diagnosis:  1.5 cm bladder stone Right renal stones  Postoperative diagnosis:  same  Procedure: Cystoscopy, laser cystolithalopaxy 1.5 cm stone Right ureteroscopy, laser lithotripsy, stone extraction Right retrograde pyelogram with interpretation Right ureteral stent placement  Surgeon: Andrez Banker, MD  Anesthesia: General  Complications: None  Intraoperative findings:  1: The patient's stone was located in the bladder, 1.5cm 2: The right retrograde pyelogram demonstrated hydroureteronephrosis down to the distal ureter, but there were no filling defects.  There is a filling defects up in the upper pole consistent with the patient's known large U PJ stone. 3: The patient did have a soft distal ureteral narrowing/stricture that I did dilate using the ureteral access sheath 4: A 26 cm time 6 French double-J ureteral stent was placed  EBL: Minimal  Specimens: The bladder stone in the right kidney stones were not sent for stone analysis.  Indication: [name] [name] is a 78 y.o. patient with long history of nephrolithiasis who was having gross hematuria.  Evaluation demonstrated right hydronephrosis as well as some stones presumed to be at the right UPJ and within the bladder.  After reviewing the management options for treatment, he elected to proceed with the above surgical procedure(s). We have discussed the potential benefits and risks of the procedure, side effects of the proposed treatment, the likelihood of the patient achieving the goals of the procedure, and any potential problems that might occur during the procedure or recuperation. Informed consent has been obtained.  Description of procedure:  Consent was obtained the preoperative holding area.  He was brought back to the operating room placed on table supine position.  General esthesia was then induced endotracheal tube was inserted.  He is placed in dorsolithotomy position and prepped and draped in the  routine sterile fashion.  Timeout subsequently performed.  21 French 30 degree cystoscope was gently passed to the patient's urethra and into the bladder under vision guidance.  Using a 5 [place] open-ended ureteral catheter I perform retrograde pyelogram on the right kidney with the above findings.  Subsequently turned my attention to the patient's stone within the bladder and using a 200 m laser fiber the stone was fragmented into small pieces I was subsequently able to easily evacuate through the cystoscope sheath.  I then advanced a sensor wire up to the patient's right ureteral orifice into the right kidney under fluoroscopic guidance.  I removed the cystoscope and exchanged for the flexible ureteroscope.  Using the dual-lumen flexible ureteroscope I was able to cannulate the patient's right ureter and advanced it up into the patient's kidney successfully.  There was a narrowing in the distal ureter as noted in findings.  However, was able to push past this fairly easily.  Once in the patient's kidney I noted a large stone at the UPJ which had pushed into the upper pole.  I used the same laser fiber with stone dusting settings and dusted the stone into very small pieces.  I triple check to make sure that there were no stones recognizable that were too big to pass.  I then turned my attention to the rest the kidney.  I performed pyeloscopy and found a second stone in the lower pole.  I fragmented the stone into small pieces and then remove those pieces with an engage basket.  Once all the stone fragments had been removed I then reevaluated the kidney noting no additional stone fragments.  There were lots of sediment within the upper pole, but no large  stone fragments.  I then slowly backed out the ureteroscope and the access sheath simultaneously noting no significant ureteral trauma.  I then advanced the cystoscope back into the patient's bladder over the wire and advanced a 26 cm time 6 French double-J  ureteral stent over the wire and into the right renal pelvis under fluoroscopic guidance.  Once the stent was noted to be well within the renal pelvis I advanced the stent to the bladder neck before removing the wire entirely.  A nice curl was noted within the renal pelvis and the bladder.  The stent tether was then pulled through the patient's urethra and secured to the dorsum of his penis.  I did opt to place a Foley catheter at this time because of some prostatic bleeding.  The patient was subsequently extubated and returned the PACU in stable condition.  There were no perioperative untoward events.  Disposition: The patient is being discharged home.  We will plan to remove his Foley catheter prior to discharge.  He will be instructed to remove his stent at home on Monday, June 16.  He subsequently has follow-up for renal ultrasound in 6 weeks.

## 2023-07-14 NOTE — Op Note (Deleted)
 cc: left flank pain   05/26/23: 78 year old man with longstanding history of urolithiasis comes in with several days of left lower quadrant pain. He has had a recent fall with left rib fractures. He developed nausea and feels like he is passing the stone. He recently passed a right ureteral stone. No fevers, chills, nausea or vomiting. Urinalysis does not appear infected.   Interval: Today the patient is here with a KUB and ultrasound prior. Over the last month he has had some left-sided flank pain that seems to be getting better, presumably related to his rib fractures. However, he has had some increased hematuria and some associated nausea. He is having occasional right flank pain.     ALLERGIES: Exelon CAPS Tamsulosin HCl CAPS    MEDICATIONS: Advil  TABS on hold  ALPRAZolam  0.25 MG Tablet 0 Oral PRN  Carbidopa -Levodopa  25-100 MG Tablet Oral  Fludrocortisone  Acetate 0.1 MG Tablet  Ondansetron  HCl 4 MG Tablet 1 tablet PO Q 8 H PRN  Rasagiline  Mesylate 1 MG Tablet  Tylenol      GU PSH: Complex Uroflow - 2019 Cysto Bladder Stone <2.5cm - 2019 Cysto Bladder Stone >2.5cm - 2022 Cysto Remove Stent FB Sim - 08/26/2022, 2022 Cysto Uretero Lithotripsy - 2016, 2014, 2012 Cystoscopy - 2019 Cystoscopy Insert Stent - 2016, 2014, 2012, 2012 Cystoscopy TURP - 2022 ESWL - 2012, 2012 Locm 300-399Mg /Ml Iodine,1Ml - 2022 Ureteroscopic laser litho - 2022 Ureteroscopic stone removal - 2012 UROLIFT - 2019       PSH Notes: Cystoscopy With Ureteroscopy With Lithotripsy, Cystoscopy With Insertion Of Ureteral Stent Left, Cystoscopy With Insertion Of Ureteral Stent Left, Cystoscopy With Ureteroscopy With Lithotripsy, Lithotripsy, Cystoscopy With Ureteroscopy With Lithotripsy, Cystoscopy With Insertion Of Ureteral Stent Left, Lithotripsy, Cystoscopy With Ureteroscopy With Manipulation Of Calculus, Cystoscopy With Insertion Of Ureteral Stent, Brain stimulator (05/2015)   NON-GU PSH: Visit Complexity (formerly  GPC1X) - 04/23/2023, 03/30/2023, 07/28/2022, 07/06/2022, 06/01/2022, 04/13/2022, 04/01/2022     GU PMH: Bladder Stone - 05/26/2023, - 04/23/2023, - 10/06/2022, - 04/13/2022, - 2022, - 2022, Bladder calculus, - 2014 LLQ pain (Stable) - 05/26/2023, Abdominal pain, LLQ (left lower quadrant), - 2016 Renal and ureteral calculus - 04/23/2023, - 03/30/2023, - 04/01/2022 Renal calculus - 10/06/2022, - 08/26/2022, - 07/28/2022, - 07/14/2022, - 07/06/2022, - 06/01/2022 (Stable), - 04/13/2022 (Stable), - 2019, Bilateral kidney stones, - 2016 Acute Cystitis/UTI - 09/09/2022 Dysuria - 07/28/2022, - 2023, - 2022 Gross hematuria - 07/28/2022, - 07/06/2022 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 Obstructive symptomatology with visiual (cysto) and flow evidence of obstruction, - 2019, - 2019 (Stable), On uroxatral  and recognizes significant improvement., - 2017 (Chronic), The patient's voiding symptoms have been stable while on Uroxatral . However, he does have some urge incontinence that has worsened over the past several years., - 2017, Benign localized prostatic hyperplasia with lower urinary tract symptoms (LUTS), - 2016 Urinary Tract Inf, Unspec site (Stable) - 04/01/2022, Urinary tract infection, - 2014 Hemorrhagic cystitis - 2023 Microscopic hematuria - 2022, - 2022 Nocturia - 2022, Nocturia, - 2016 Ureteral calculus - 2022, Calculus of left ureter, - 2016 Nocturnal Enuresis - 2022 Urinary Urgency (Stable) - 2021, (Improving), Treating his constipation in addition to his bladder symptoms has improved his urgency significantly., - 2017 Urinary Obstruction - 2019 Urinary Frequency (Improving), Myrbetriq 25mg  has made a bid difference for him. PVR is minimal today. - 2017 Urinary Retention, Unspec, Urinary retention -  2014 BPH w/o LUTS, Benign Prostatic Hypertrophy - 2014 Peyronies Disease, Peyronie's Disease  - 2014      PMH Notes: Patient has Parkinson's.   He has a history of Nephrolithiasis - had right URS by Dr. Levi Real in Aug 2016, non-obstructing stone in the right kidney from CT scan in May 2016.   The patient has a family history of prostate cancer. His father died of metastatic prostate cancer in his 40s.      NON-GU PMH: Bacteriuria - 07/14/2022 Calculus of gallbladder without cholecystitis without obstruction, Cholelithiasis - 2016 Nausea, Nausea - 2016 Encounter for general adult medical examination without abnormal findings, Encounter for preventive health examination - 2015 Anxiety, Anxiety (Symptom) - 2014 Parkinson's disease, Parkinson's Disease - 2014    FAMILY HISTORY: Brain Cancer - Father Father Deceased At Age26 ___ - Runs In Family Lung Cancer - Runs In Family Mother Deceased At Age 38 from diabetic complicati - Runs In Family rheumatoid arthritis - Mother Transient Ischemic Attack - Father   SOCIAL HISTORY: Marital Status: Married Preferred Language: English; Ethnicity: Not Hispanic Or Latino; Race: White Current Smoking Status: Patient has never smoked.   Tobacco Use Assessment Completed: Used Tobacco in last 30 days? Drinks 1 caffeinated drink per day.     Notes: Caffeine Use, Alcohol  Use, Never A Smoker, Marital History - Currently Married   REVIEW OF SYSTEMS:    GU Review Male:   Patient denies frequent urination, hard to postpone urination, burning/ pain with urination, get up at night to urinate, leakage of urine, stream starts and stops, trouble starting your stream, have to strain to urinate , erection problems, and penile pain.  Gastrointestinal (Upper):   Patient denies nausea, vomiting, and indigestion/ heartburn.  Gastrointestinal (Lower):   Patient denies diarrhea and constipation.  Constitutional:   Patient denies fever, night sweats, weight loss, and fatigue.  Skin:   Patient denies skin rash/ lesion and itching.  Eyes:   Patient denies blurred  vision and double vision.  Ears/ Nose/ Throat:   Patient denies sore throat and sinus problems.  Hematologic/Lymphatic:   Patient denies swollen glands and easy bruising.  Cardiovascular:   Patient denies leg swelling and chest pains.  Respiratory:   Patient denies cough and shortness of breath.  Endocrine:   Patient denies excessive thirst.  Musculoskeletal:   Patient denies back pain and joint pain.  Neurological:   Patient denies headaches and dizziness.  Psychologic:   Patient denies depression and anxiety.   VITAL SIGNS:      06/25/2023 03:56 PM  BP 145/79 mmHg  Pulse 69 /min   MULTI-SYSTEM PHYSICAL EXAMINATION:    Constitutional: Well-nourished. No physical deformities. Normally developed. Good grooming.  Respiratory: No labored breathing, no use of accessory muscles.   Cardiovascular: Regular rate and rhythm. No murmur, no gallop. Normal temperature, normal extremity pulses, no swelling, no varicosities.      Complexity of Data:  Source Of History:  Patient  Records Review:   Previous Doctor Records, Previous Hospital Records, Previous Patient Records, POC Tool  Urine Test Review:   Urinalysis  X-Ray Review: KUB: Reviewed Films. Discussed With Patient.  Renal Ultrasound: Reviewed Films. Discussed With Patient.  C.T. Abdomen/Pelvis: Reviewed Films. Discussed With Patient.     07/25/20 08/03/19 02/23/17 10/01/15 08/01/13 11/20/10  PSA  Total PSA 2.45 ng/mL 2.18 ng/mL 3.15 ng/mL 2.24 ng/dl 1.61  0.96     PROCEDURES:         KUB - 74018  A single  view of the abdomen is obtained. Renal shadows are easily visualized bilaterally. The patient has a large stone in the right upper pole as well as a medium size stone in the right UVJ. There is lots of little stones that are nonobstructing in the left kidney. There is a large bladder stone, but no associated areas along the ureter notable for stone. Gas pattern is grossly normal. No significant bony abnormalities.       Impression: Large bladder stone, 2 stones in the right kidney and lots of little stones in the left kidney.           Renal Ultrasound - 96045  Right kidney  Length: 11.28 cm Depth: 6.71 cm Cortical Width: 1.40 cm Width: 6.51 cm  Left Kidney Length: 11.47 cm Depth: 5.27 cm Cortical Width: 1.22 cm Width: 5.01 cm   Left Kidney/Ureter:  Multiple nonobstructing calcs, largest 0.9 cm  Right Kidney/Ureter:  Hydro; calc noted in prox ur 0.9 cm; multiple nonobstructing calcs  Bladder:  PVR 5.58 ml; stone 1.41 cm      Patient confirmed No Neulasta OnPro Device.           Urinalysis w/Scope - 81001 Dipstick Dipstick Cont'd Micro  Color: Yellow Bilirubin: Neg WBC/hpf: >60/hpf  Appearance: Slightly Cloudy Ketones: Neg RBC/hpf: 20 - 40/hpf  Specific Gravity: 1.025 Blood: Neg Bacteria: Few (10-25/hpf)  pH: 6.0 Protein: 1+ Cystals: Ca Oxalate  Glucose: Neg Urobilinogen: 0.2 Casts: NS (Not Seen)    Nitrites: Neg Trichomonas: Not Present    Leukocyte Esterase: 1+ Mucous: Not Present      Epithelial Cells: 0 - 5/hpf      Yeast: NS (Not Seen)      Sperm: Not Present    Notes:      ASSESSMENT:      ICD-10 Details  1 GU:   Bladder Stone - N21.0   2   Renal and ureteral calculus - N20.2    PLAN:           Document Letter(s):  Created for Patient: Clinical Summary         Notes:   The patient appears to have a right sided UPJ stone with associated hydronephrosis. He also has a large bladder stone. There is also a large nonobstructing stone in the right upper pole. The stones in the left are quite small and that kidney is atrophic.   We discussed management strategies. We discussed shockwave lithotripsy for the UPJ stone versus ureteroscopy and cystolitholapaxy. The patient is opted to proceed with ureteroscopy and cystolitholapaxy. Will try to clean out the right side of his kidney and urinary tract. Will try to get this scheduled soon as possible. The patient does have a deep brain  stimulator from [employer], and he would like us  to touch base with the rep regarding this.

## 2023-07-15 ENCOUNTER — Encounter (HOSPITAL_COMMUNITY): Payer: Self-pay | Admitting: Urology

## 2023-08-13 NOTE — Progress Notes (Deleted)
 Assessment/Plan:   1.  Parkinsons disease, with dyskinesia with motor fluctuations, diagnosed 2006             -Status post left STN DBS at [facility] in 2014 and status post right STN DBS at [facility] in 2017.  He had his IPGs changed in December, 2022, and both were EOS at the time.  These were changed out by Dr. Leellen.  Patient with [employer] device.  Current batteries are not EOS.  He would like to consider a rechargeable battery in the future.             -[facility] notes that speech difficulties persisted even with changing DBS settings.  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  Would likely do best with outpatient speech therapy, along with his speech device.             -[facility] notes indicate that patient had dystonic posturing of the right arm and hands, even when DBS was off/EOS, but patient notes that he sometimes turns the DBS off just to get relief from the dystonic posturing and pulling of the right arm and then turns it back on.  His appointment was quite lengthy today, but we may need to look into programming of that, although patient was generally happy with the programming of the device.             -[facility] is noted that patient has never had control of right lower extremity tremor, although I did not see much tremor today in the right leg.             -Patient is having significant off, especially when he goes to restaurants.  In fact, his wife notes that it is almost always at restaurants.  This makes me think that he is having protein interference with the levodopa  and we discussed this today.  He was also given samples of Inbrija .  He was shown how to use it.  They can let us  know how he does with this.             -***carbidopa /levodopa , 2-2.5 tablets 4 times per day at 7am/10:30am/2pm/5:30pm             -He will continue carbidopa /levodopa  50/200 CR at bedtime             -He has been on rasagiline  in the past, but is currently on selegiline,  5 mg twice per day.  I may discontinue that in the future.  I do not think it is particularly helpful, nor does the patient.             -We discussed Vyalev, which is foscarbidopa/foslevodopa pump that is newly FDA approved.  We discussed that it is for motor fluctuations in adults with advanced Parkinson's disease.  We discussed that this likely will not be on Medicare formulary until the latter half of 2025.  We discussed risks and benefits of this drug.  Right now, this is not available to him, but certainly could be beneficial in the future.             -He does have a U step at home.  He also walked on our upright U step actually did quite well with that compared to the regular walkers.   2.  Neurogenic Orthostatic Hypotension             -Sounds like syncope has been a big issue  for him and he follows with cardiology for this.             - Continue Florinef  0.1 mg 3 times per day.             -Sounds like he was on midodrine  in the past for just a few pills and he thought it caused more anxiety.   3.  Nephrolithiasis - Status post lithotripsy - Follows with urology for this and urinary retention  Subjective:   [name] [name] was seen today in follow up for Parkinsons disease.  My previous records were reviewed prior to todays visit as well as outside records available to me.  Numerous records have been reviewed.  Patient last seen here in March, which was our first visit.  We did start him on Inbrija .  He reports that ***.  He was in the emergency room April 13 after a fall.  He was evaluated from the emergency room and released.  He was in the emergency room May 29 because of fall and urinary retention.  Notes indicate that patient was getting ready to go to the urology office and he fell and it sounds like he had a syncopal episode rather than just a simple fall.  He was evaluated by the emergency room and released.  Only a few days later he was back in the emergency room, this time being  admitted for mental status change.  The etiology of this mental status change was really unclear.  His Xanax  was discontinued.  His B12 was found to be low at 176 and he was started on injections and hydrated and ultimately improved.  SNF was recommended and declined.  Patient did have outpatient cystoscopy with lithotripsy for nephrolithiasis with stent placement on June 11.  Current prescribed movement disorder medications: *** Carbidopa /levodopa  25/100, 2.5 tablets 4 times per day (7am/10:30am/2pm/5:30pm) Carbidopa /levodopa  50/200 at bedtime Inbrija  as needed (given last visit) Selegiline 5 mg twice per day Florinef  0.1 mg tid (6am/11am/5pm) -   PREVIOUS MEDICATIONS: carbidopa  levodopa ; rotigotine  patch (compulsive behavior); clonazepam  (stopped because of increased falls, but he was on a large dose of up to 2 mg); rasagiline ; selegiline; midodrine  (sounds like he tried it once and thought he had some anxiety and jitteriness with it)   ALLERGIES:   Allergies  Allergen Reactions   Rivastigmine Tartrate Nausea And Vomiting     Can tolerate the patch but can not tolerate the pills   Tamsulosin Other (See Comments)    Severe joint pain   Alfuzosin      Other reaction(s): Other (See Comments) hypotension    CURRENT MEDICATIONS:  No outpatient medications have been marked as taking for the 08/16/23 encounter (Appointment) with Kerry Odonohue, [place]berry RAMAN, DO.     Objective:   PHYSICAL EXAMINATION:    VITALS:  There were no vitals filed for this visit.  GEN:  The patient appears stated age and is in NAD. HEENT:  Normocephalic, atraumatic.  The mucous membranes are moist. The superficial temporal arteries are without ropiness or tenderness. CV:  RRR Lungs:  CTAB Neck/HEME:  There are no carotid bruits bilaterally.  Neurological examination:  Orientation: The patient is alert and oriented x3.  Cranial nerves: There is good facial symmetry. Extraocular muscles are intact. The visual fields are  full to confrontational testing. The speech is fluent and very hypophonic (virtually untelligible at times and always at a whisper). Soft palate rises symmetrically and there is no tongue deviation. Hearing is intact to conversational  tone. Sensation: Sensation is intact to light and pinprick throughout (facial, trunk, extremities). Vibration is intact at the bilateral big toe. There is no extinction with double simultaneous stimulation. There is no sensory dermatomal level identified. Motor: Strength is 5/5 in the bilateral upper and lower extremities.        Movement examination: Tone: There is mild increased tone in the right upper extremity.  The right upper extremity is mildly dystonic.  Otherwise, tone is normal. Abnormal movements: rare dyskinesia on the R Coordination:  There is mild decremation with RAM's, with finger taps on the right Gait and Station: The patient pushes off to arise.  We tried him on several walkers, but ultimately he does festinate and has freezing and drags the right leg.   I have reviewed and interpreted the following labs independently    Chemistry      Component Value Date/Time   NA 135 07/06/2023 0516   K 3.5 07/06/2023 0516   CL 103 07/06/2023 0516   CO2 26 07/06/2023 0516   BUN 39 (H) 07/06/2023 0516   CREATININE 0.96 07/06/2023 0516      Component Value Date/Time   CALCIUM  9.1 07/06/2023 0516   ALKPHOS 53 07/06/2023 0516   AST 11 (L) 07/06/2023 0516   ALT <5 07/06/2023 0516   BILITOT 0.7 07/06/2023 0516       Lab Results  Component Value Date   WBC 5.8 07/06/2023   HGB 12.0 (L) 07/06/2023   HCT 36.3 (L) 07/06/2023   MCV 97.3 07/06/2023   PLT 263 07/06/2023    Lab Results  Component Value Date   TSH 1.902 07/06/2023     Total time spent on today's visit was ***30 minutes, including both face-to-face time and nonface-to-face time.  Time included that spent on review of records (prior notes available to me/labs/imaging if pertinent),  discussing treatment and goals, answering patient's questions and coordinating care.  Cc:  Beverley Louann DASEN, MD

## 2023-08-16 ENCOUNTER — Encounter: Admitting: Neurology

## 2023-08-26 NOTE — Progress Notes (Signed)
 Assessment/Plan:   1.  Parkinsons disease, with dyskinesia with motor fluctuations, diagnosed 2006             -Status post left STN DBS at [facility] in 2014 and status post right STN DBS at [facility] in 2017.  He had his IPGs changed in December, 2022, and both were EOS at the time.  These were changed out by Dr. Leellen.  Patient with Medtronic device.  Current batteries are not EOS.  He would like to consider a rechargeable battery in the future.             -[facility] notes that speech difficulties persisted even with changing DBS settings.  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  Would likely do best with outpatient speech therapy, along with his speech device.             -[facility] notes indicate that patient had dystonic posturing of the right arm and hands, even when DBS was off/EOS, but patient notes that he sometimes turns the DBS off just to get relief from the dystonic posturing and pulling of the right arm and then turns it back on.  His appointment was quite lengthy today, but we may need to look into programming of that, although patient was generally happy with the programming of the device.             -[facility] is noted that patient has never had control of right lower extremity tremor, although I did not see much tremor today in the right leg any time I have seen him.             - Continue carbidopa /levodopa , 2-2.5 tablets 4 times per day at 7am/10:30am/2pm/5:30pm for now, but hoping we can get him Vyalev given amount of off time             -He will continue carbidopa /levodopa  50/200 CR at bedtime             -He has been on rasagiline  in the past, but is currently on selegiline, 5 mg twice per day.  I may discontinue that in the future.  I do not think it is particularly helpful, nor does the patient.             -Pt and I discussed Vyalev.  Discussed r/b/se.  Patient has motor fluctuations, including dyskinesia and bradykinesia and has at least  2.5 hours of off time per day.  Patient could certainly benefit from 24 hour/day infusion therapy.             -He does have a U step at home.  He also walked on our upright U step actually did quite well with that compared to the regular walkers.  -ST RX given.  He is already doing physical therapy at home and I hope they will add speech therapy on.  If not, I would like him to start this as an outpatient elsewhere.  - Patient apparently has [facility] approval to have a home health aide.  They asked me about agencies and we discussed these today.   2.  Neurogenic Orthostatic Hypotension             -Sounds like syncope has been a big issue for him and he follows with cardiology for this.             - Continue Florinef  0.2 mg 3 times per day.  This is  being RX at [facility].  He is taking KCL with this             -Sounds like he was on midodrine  in the past for just a few pills and he thought it caused more anxiety.  I told him today that I don't think that it was likely the midodrine , but rather the low blood pressure.  We are going to restart it today.  He was told to take midodrine , 5 mg 3 times per day with meals.  He will keep me updated on his blood pressure.   3.  Nephrolithiasis - Status post lithotripsy - Follows with urology for this and urinary retention  4.  Fatigue  -we discussed that fatigue is the number one treatment resistant complaint of Parkinsons Disease patients.  No matter what we do with the Parkinsons Disease medications (including d/c them), patients tend to c/o fatigue, unfortunately.     5.  Eyelid opening apraxia  - Discussed value of Botox.  He wants to hold on that.  Subjective:   [name] [name] was seen today in follow up for Parkinsons disease.  Pt with wife who supplements hx.  My previous records were reviewed prior to todays visit as well as outside records available to me.  Numerous records have been reviewed.  Patient last seen here in March, which was our  first visit.  We did start him on Inbrija .  He reports that they thought it was to help his BP so they took that when it was low today.  I thought it helped to bring up his blood pressure.  He was in the emergency room April 13 after a fall.  He was evaluated from the emergency room and released.  He was in the emergency room May 29 because of fall and urinary retention.  Notes indicate that patient was getting ready to go to the urology office and he fell and it sounds like he had a syncopal episode rather than just a simple fall.  He was evaluated by the emergency room and released.  Only a few days later he was back in the emergency room, this time being admitted for mental status change.  The etiology of this mental status change was really unclear.  His Xanax  was discontinued.  His B12 was found to be low at 176 and he was started on injections and hydrated and ultimately improved.  SNF was recommended and declined.  Patient did have outpatient cystoscopy with lithotripsy for nephrolithiasis with stent placement on June 11.  He has been very dizzy.  Reports today that his BP was in the 50's systolic earlier today and they gave him liquid IV.  He is still dizzy.  He reports significant amounts of off time.  He estimates that this is many hours per day, perhaps 3-5 and even more.  Current prescribed movement disorder medications:  Carbidopa /levodopa  25/100, 2.5 tablets 4 times per day (7am/10:30am/2pm/5:30pm) Carbidopa /levodopa  50/200 at bedtime Inbrija  as needed (given last visit) Selegiline 5 mg twice per day Florinef  0.2 mg tid (6am/11am/5pm) - this has been increased   PREVIOUS MEDICATIONS: carbidopa  levodopa ; rotigotine  patch (compulsive behavior); clonazepam  (stopped because of increased falls, but he was on a large dose of up to 2 mg); rasagiline ; selegiline; midodrine  (sounds like he tried it once and thought he had some anxiety and jitteriness with it)   ALLERGIES:   Allergies  Allergen  Reactions   Rivastigmine Tartrate Nausea And Vomiting  Can tolerate the patch but can not tolerate the pills   Tamsulosin Other (See Comments)    Severe joint pain   Alfuzosin      Other reaction(s): Other (See Comments) hypotension    CURRENT MEDICATIONS:  Current Meds  Medication Sig   calcium  carbonate (TUMS - DOSED IN MG ELEMENTAL CALCIUM ) 500 MG chewable tablet Chew 1 tablet by mouth daily as needed for indigestion or heartburn.   carbidopa -levodopa  (SINEMET  CR) 50-200 MG tablet Take 1 tablet by mouth at bedtime.   carbidopa -levodopa  (SINEMET  IR) 25-100 MG tablet 2-2.5 tablets 4 times per day at 7am/10:30am/2pm/5:30pm (Patient taking differently: 2-2.5 tablets 4 times per day at 6am/10:30am/2pm/6pm)   cyanocobalamin  1000 MCG tablet Take 1 tablet (1,000 mcg total) by mouth daily.   fludrocortisone  (FLORINEF ) 0.1 MG tablet Take 2 tablets (0.2 mg total) by mouth 3 (three) times daily with meals.     Objective:   PHYSICAL EXAMINATION:    VITALS:   Vitals:   08/30/23 1300  BP: (!) 110/45  Pulse: 84  SpO2: 98%  Weight: 156 lb (70.8 kg)    GEN:  The patient appears stated age and is in NAD. HEENT:  Normocephalic, atraumatic.  The mucous membranes are moist. The superficial temporal arteries are without ropiness or tenderness. CV:  RRR Lungs:  CTAB Neck/HEME:  There are no carotid bruits bilaterally.  Neurological examination:  Orientation: The patient is alert and oriented x3.  Cranial nerves: There is good facial symmetry. Extraocular muscles are intact. The visual fields are full to confrontational testing. The speech is fluent and very hypophonic (virtually untelligible at times and always at a whisper - same as prior). Soft palate rises symmetrically and there is no tongue deviation. Hearing is intact to conversational tone. Sensation: Sensation is intact to light and pinprick throughout (facial, trunk, extremities). Vibration is intact at the bilateral big toe. There  is no extinction with double simultaneous stimulation. There is no sensory dermatomal level identified. Motor: Strength is 5/5 in the bilateral upper and lower extremities.        Movement examination: Tone: There is mild increased tone in the right upper extremity.  The right upper extremity is mildly dystonic.  Otherwise, tone is normal. Abnormal movements: No dyskinesia today Coordination:  There is mild decremation with RAM's, with finger taps on the right Gait and Station: Not tested today due to blood pressure being very low and patient felt nauseated at beginning of exam.  Last visit we tried him on several walkers, but ultimately he does festinate and has freezing and drags the right leg.   I have reviewed and interpreted the following labs independently    Chemistry      Component Value Date/Time   NA 135 07/06/2023 0516   K 3.5 07/06/2023 0516   CL 103 07/06/2023 0516   CO2 26 07/06/2023 0516   BUN 39 (H) 07/06/2023 0516   CREATININE 0.96 07/06/2023 0516      Component Value Date/Time   CALCIUM  9.1 07/06/2023 0516   ALKPHOS 53 07/06/2023 0516   AST 11 (L) 07/06/2023 0516   ALT <5 07/06/2023 0516   BILITOT 0.7 07/06/2023 0516       Lab Results  Component Value Date   WBC 5.8 07/06/2023   HGB 12.0 (L) 07/06/2023   HCT 36.3 (L) 07/06/2023   MCV 97.3 07/06/2023   PLT 263 07/06/2023    Lab Results  Component Value Date   TSH 1.902 07/06/2023  Total time spent on today's visit was 70 minutes, including both face-to-face time and nonface-to-face time.  Time included that spent on review of records (prior notes available to me/labs/imaging if pertinent), discussing treatment and goals, answering patient's questions and coordinating care.  It did not include the DBS time.  Cc:  Beverley Louann DASEN, MD

## 2023-08-30 ENCOUNTER — Ambulatory Visit (INDEPENDENT_AMBULATORY_CARE_PROVIDER_SITE_OTHER): Admitting: Neurology

## 2023-08-30 ENCOUNTER — Other Ambulatory Visit: Payer: Self-pay

## 2023-08-30 VITALS — BP 110/45 | HR 84 | Wt 156.0 lb

## 2023-08-30 DIAGNOSIS — Z9689 Presence of other specified functional implants: Secondary | ICD-10-CM

## 2023-08-30 DIAGNOSIS — R498 Other voice and resonance disorders: Secondary | ICD-10-CM | POA: Diagnosis not present

## 2023-08-30 DIAGNOSIS — G903 Multi-system degeneration of the autonomic nervous system: Secondary | ICD-10-CM

## 2023-08-30 DIAGNOSIS — R482 Apraxia: Secondary | ICD-10-CM | POA: Diagnosis not present

## 2023-08-30 DIAGNOSIS — R5383 Other fatigue: Secondary | ICD-10-CM | POA: Diagnosis not present

## 2023-08-30 DIAGNOSIS — G20A2 Parkinson's disease without dyskinesia, with fluctuations: Secondary | ICD-10-CM

## 2023-08-30 MED ORDER — MIDODRINE HCL 5 MG PO TABS: 5.0000 mg | ORAL_TABLET | Freq: Three times a day (TID) | ORAL | 0 refills | Status: DC

## 2023-08-30 MED ORDER — INBRIJA 42 MG IN CAPS: ORAL_CAPSULE | RESPIRATORY_TRACT | Status: DC

## 2023-08-30 MED ORDER — MIDODRINE HCL 5 MG PO TABS: 5.0000 mg | ORAL_TABLET | Freq: Three times a day (TID) | ORAL | 1 refills | Status: AC

## 2023-08-30 NOTE — Procedures (Signed)
 DBS Programming was performed.    Manufacturer of DBS device: Medtronic  Total time spent programming was 8  minutes.  Device was confirmed to be on.  Soft start was confirmed to be on.  Impedences were checked and were within normal limits.  Battery was checked and was determined to be functioning normally and not near the end of life.  Final settings were as follows:   Active Contact Amplitude (mA) PW (ms) Frequency (hz) Side Effects Battery  Left Brain        04/06/23 1-2+ 2.4 60 185  2.95  08/30/23 1-2+ 2.3 60 185  2.94                  Right Brain        04/06/23 2-C+ 2.5 60 185  2.94  08/30/23 2-C+ 2.5 60 185  2.92

## 2023-09-15 ENCOUNTER — Encounter (HOSPITAL_COMMUNITY): Payer: Self-pay | Admitting: Emergency Medicine

## 2023-09-15 ENCOUNTER — Emergency Department (HOSPITAL_COMMUNITY)

## 2023-09-15 ENCOUNTER — Inpatient Hospital Stay (HOSPITAL_COMMUNITY)
Admission: EM | Admit: 2023-09-15 | Discharge: 2023-09-20 | DRG: 054 | Disposition: A | Discharge status: Home Health | Attending: Family Medicine | Admitting: Family Medicine

## 2023-09-15 ENCOUNTER — Other Ambulatory Visit: Payer: Self-pay

## 2023-09-15 DIAGNOSIS — R001 Bradycardia, unspecified: Secondary | ICD-10-CM | POA: Diagnosis present

## 2023-09-15 DIAGNOSIS — N400 Enlarged prostate without lower urinary tract symptoms: Secondary | ICD-10-CM | POA: Diagnosis present

## 2023-09-15 DIAGNOSIS — Z79899 Other long term (current) drug therapy: Secondary | ICD-10-CM

## 2023-09-15 DIAGNOSIS — Z9841 Cataract extraction status, right eye: Secondary | ICD-10-CM

## 2023-09-15 DIAGNOSIS — T380X5A Adverse effect of glucocorticoids and synthetic analogues, initial encounter: Secondary | ICD-10-CM | POA: Diagnosis not present

## 2023-09-15 DIAGNOSIS — I951 Orthostatic hypotension: Secondary | ICD-10-CM

## 2023-09-15 DIAGNOSIS — R5381 Other malaise: Secondary | ICD-10-CM | POA: Diagnosis present

## 2023-09-15 DIAGNOSIS — C719 Malignant neoplasm of brain, unspecified: Secondary | ICD-10-CM

## 2023-09-15 DIAGNOSIS — F919 Conduct disorder, unspecified: Secondary | ICD-10-CM | POA: Diagnosis present

## 2023-09-15 DIAGNOSIS — Z9842 Cataract extraction status, left eye: Secondary | ICD-10-CM

## 2023-09-15 DIAGNOSIS — Z9049 Acquired absence of other specified parts of digestive tract: Secondary | ICD-10-CM

## 2023-09-15 DIAGNOSIS — Z9079 Acquired absence of other genital organ(s): Secondary | ICD-10-CM

## 2023-09-15 DIAGNOSIS — E876 Hypokalemia: Secondary | ICD-10-CM | POA: Diagnosis not present

## 2023-09-15 DIAGNOSIS — G20A1 Parkinson's disease without dyskinesia, without mention of fluctuations: Secondary | ICD-10-CM

## 2023-09-15 DIAGNOSIS — R509 Fever, unspecified: Secondary | ICD-10-CM | POA: Diagnosis present

## 2023-09-15 DIAGNOSIS — E860 Dehydration: Secondary | ICD-10-CM | POA: Diagnosis present

## 2023-09-15 DIAGNOSIS — Z7952 Long term (current) use of systemic steroids: Secondary | ICD-10-CM

## 2023-09-15 DIAGNOSIS — R4182 Altered mental status, unspecified: Secondary | ICD-10-CM | POA: Diagnosis not present

## 2023-09-15 DIAGNOSIS — D649 Anemia, unspecified: Secondary | ICD-10-CM | POA: Diagnosis present

## 2023-09-15 DIAGNOSIS — K219 Gastro-esophageal reflux disease without esophagitis: Secondary | ICD-10-CM | POA: Diagnosis present

## 2023-09-15 DIAGNOSIS — D496 Neoplasm of unspecified behavior of brain: Secondary | ICD-10-CM | POA: Diagnosis not present

## 2023-09-15 DIAGNOSIS — G936 Cerebral edema: Principal | ICD-10-CM | POA: Diagnosis present

## 2023-09-15 DIAGNOSIS — G9389 Other specified disorders of brain: Secondary | ICD-10-CM

## 2023-09-15 DIAGNOSIS — R471 Dysarthria and anarthria: Secondary | ICD-10-CM | POA: Diagnosis present

## 2023-09-15 DIAGNOSIS — Z8249 Family history of ischemic heart disease and other diseases of the circulatory system: Secondary | ICD-10-CM

## 2023-09-15 DIAGNOSIS — Z974 Presence of external hearing-aid: Secondary | ICD-10-CM

## 2023-09-15 DIAGNOSIS — R739 Hyperglycemia, unspecified: Secondary | ICD-10-CM | POA: Diagnosis not present

## 2023-09-15 DIAGNOSIS — R309 Painful micturition, unspecified: Secondary | ICD-10-CM | POA: Diagnosis present

## 2023-09-15 DIAGNOSIS — Z8701 Personal history of pneumonia (recurrent): Secondary | ICD-10-CM

## 2023-09-15 DIAGNOSIS — Z87442 Personal history of urinary calculi: Secondary | ICD-10-CM

## 2023-09-15 DIAGNOSIS — F32A Depression, unspecified: Secondary | ICD-10-CM | POA: Diagnosis present

## 2023-09-15 DIAGNOSIS — I10 Essential (primary) hypertension: Secondary | ICD-10-CM | POA: Diagnosis present

## 2023-09-15 DIAGNOSIS — Z87440 Personal history of urinary (tract) infections: Secondary | ICD-10-CM

## 2023-09-15 DIAGNOSIS — R627 Adult failure to thrive: Secondary | ICD-10-CM | POA: Diagnosis present

## 2023-09-15 DIAGNOSIS — Z1152 Encounter for screening for COVID-19: Secondary | ICD-10-CM

## 2023-09-15 DIAGNOSIS — Z888 Allergy status to other drugs, medicaments and biological substances status: Secondary | ICD-10-CM

## 2023-09-15 LAB — COMPREHENSIVE METABOLIC PANEL WITH GFR
ALT: 10 U/L (ref 0–44)
AST: 15 U/L (ref 15–41)
Albumin: 3.2 g/dL — ABNORMAL LOW (ref 3.5–5.0)
Alkaline Phosphatase: 62 U/L (ref 38–126)
Anion gap: 9 (ref 5–15)
BUN: 18 mg/dL (ref 8–23)
CO2: 28 mmol/L (ref 22–32)
Calcium: 8.3 mg/dL — ABNORMAL LOW (ref 8.9–10.3)
Chloride: 103 mmol/L (ref 98–111)
Creatinine, Ser: 0.83 mg/dL (ref 0.61–1.24)
GFR, Estimated: >60 mL/min (ref >60)
Glucose, Bld: 95 mg/dL (ref 70–99)
Potassium: 2.6 mmol/L — CL (ref 3.5–5.1)
Sodium: 140 mmol/L (ref 135–145)
Total Bilirubin: 1.3 mg/dL — ABNORMAL HIGH (ref 0.0–1.2)
Total Protein: 6.1 g/dL — ABNORMAL LOW (ref 6.5–8.1)

## 2023-09-15 LAB — RESP PANEL BY RT-PCR (RSV, FLU A&B, COVID)  RVPGX2
Influenza A by PCR: NEGATIVE
Influenza B by PCR: NEGATIVE
Resp Syncytial Virus by PCR: NEGATIVE
SARS Coronavirus 2 by RT PCR: NEGATIVE

## 2023-09-15 LAB — URINALYSIS, W/ REFLEX TO CULTURE (INFECTION SUSPECTED)
Bilirubin Urine: NEGATIVE
Glucose, UA: 100 mg/dL — AB
Hgb urine dipstick: NEGATIVE
Ketones, ur: NEGATIVE mg/dL
Leukocytes,Ua: NEGATIVE
Nitrite: NEGATIVE
Protein, ur: NEGATIVE mg/dL
RBC / HPF: NONE SEEN RBC/hpf (ref 0–5)
Specific Gravity, Urine: 1.015 (ref 1.005–1.030)
WBC, UA: NONE SEEN WBC/hpf (ref 0–5)
pH: 7.5 (ref 5.0–8.0)

## 2023-09-15 LAB — BASIC METABOLIC PANEL WITH GFR
Anion gap: 9 (ref 5–15)
BUN: 14 mg/dL (ref 8–23)
CO2: 27 mmol/L (ref 22–32)
Calcium: 8 mg/dL — ABNORMAL LOW (ref 8.9–10.3)
Chloride: 104 mmol/L (ref 98–111)
Creatinine, Ser: 0.79 mg/dL (ref 0.61–1.24)
GFR, Estimated: >60 mL/min (ref >60)
Glucose, Bld: 95 mg/dL (ref 70–99)
Potassium: 3.7 mmol/L (ref 3.5–5.1)
Sodium: 140 mmol/L (ref 135–145)

## 2023-09-15 LAB — CBC
HCT: 35.7 % — ABNORMAL LOW (ref 39.0–52.0)
Hemoglobin: 12.2 g/dL — ABNORMAL LOW (ref 13.0–17.0)
MCH: 32.3 pg (ref 26.0–34.0)
MCHC: 34.2 g/dL (ref 30.0–36.0)
MCV: 94.4 fL (ref 80.0–100.0)
Platelets: 263 K/uL (ref 150–400)
RBC: 3.78 MIL/uL — ABNORMAL LOW (ref 4.22–5.81)
RDW: 12.2 % (ref 11.5–15.5)
WBC: 7.6 K/uL (ref 4.0–10.5)
nRBC: 0 % (ref 0.0–0.2)

## 2023-09-15 LAB — AMMONIA: Ammonia: <13 umol/L (ref 9–35)

## 2023-09-15 LAB — CBG MONITORING, ED: Glucose-Capillary: 101 mg/dL — ABNORMAL HIGH (ref 70–99)

## 2023-09-15 LAB — MAGNESIUM: Magnesium: 2 mg/dL (ref 1.7–2.4)

## 2023-09-15 LAB — ETHANOL: Alcohol, Ethyl (B): <15 mg/dL (ref <15)

## 2023-09-15 LAB — TSH: TSH: 1.255 u[IU]/mL (ref 0.350–4.500)

## 2023-09-15 MED ORDER — CARBIDOPA-LEVODOPA 25-100 MG PO TABS: 2.0000 | ORAL_TABLET | Freq: Four times a day (QID) | ORAL | Status: DC

## 2023-09-15 MED ORDER — SODIUM CHLORIDE 0.9% FLUSH
3.0000 mL | Freq: Two times a day (BID) | INTRAVENOUS | Status: DC
  Administered 2023-09-16 – 2023-09-20 (×9): 3 mL via INTRAVENOUS

## 2023-09-15 MED ORDER — DEXAMETHASONE SODIUM PHOSPHATE 4 MG/ML IJ SOLN
4.0000 mg | Freq: Two times a day (BID) | INTRAMUSCULAR | Status: AC
  Administered 2023-09-15 – 2023-09-17 (×6): 4 mg via INTRAVENOUS
  Filled 2023-09-15 (×5): qty 1

## 2023-09-15 MED ORDER — ENOXAPARIN SODIUM 40 MG/0.4ML IJ SOSY
40.0000 mg | PREFILLED_SYRINGE | INTRAMUSCULAR | Status: DC
  Administered 2023-09-17: 40 mg via SUBCUTANEOUS
  Filled 2023-09-15 (×3): qty 0.4

## 2023-09-15 MED ORDER — PROCHLORPERAZINE EDISYLATE 10 MG/2ML IJ SOLN: 5.0000 mg | Freq: Four times a day (QID) | INTRAMUSCULAR | Status: DC | PRN

## 2023-09-15 MED ORDER — SENNOSIDES-DOCUSATE SODIUM 8.6-50 MG PO TABS: 1.0000 | ORAL_TABLET | Freq: Every evening | ORAL | Status: DC | PRN

## 2023-09-15 MED ORDER — SODIUM CHLORIDE 0.9 % IV SOLN
1.0000 g | Freq: Once | INTRAVENOUS | Status: AC
  Administered 2023-09-15 (×2): 1 g via INTRAVENOUS
  Filled 2023-09-15: qty 10

## 2023-09-15 MED ORDER — POTASSIUM CHLORIDE 10 MEQ/100ML IV SOLN
10.0000 meq | Freq: Once | INTRAVENOUS | Status: AC
  Administered 2023-09-15 (×2): 10 meq via INTRAVENOUS
  Filled 2023-09-15: qty 100

## 2023-09-15 MED ORDER — ACETAMINOPHEN 650 MG RE SUPP
650.0000 mg | Freq: Once | RECTAL | Status: AC
  Administered 2023-09-15 (×2): 650 mg via RECTAL
  Filled 2023-09-15: qty 1

## 2023-09-15 MED ORDER — GADOBUTROL 1 MMOL/ML IV SOLN
9.0000 mL | Freq: Once | INTRAVENOUS | Status: AC | PRN
  Administered 2023-09-15 (×2): 9 mL via INTRAVENOUS

## 2023-09-15 MED ORDER — ACETAMINOPHEN 325 MG PO TABS: 650.0000 mg | ORAL_TABLET | Freq: Four times a day (QID) | ORAL | Status: DC | PRN

## 2023-09-15 MED ORDER — IOHEXOL 300 MG/ML  SOLN: 75.0000 mL | Freq: Once | INTRAMUSCULAR | Status: DC | PRN

## 2023-09-15 MED ORDER — IOHEXOL 300 MG/ML  SOLN
75.0000 mL | Freq: Once | INTRAMUSCULAR | Status: AC | PRN
  Administered 2023-09-15 (×2): 75 mL via INTRAVENOUS

## 2023-09-15 MED ORDER — MIDODRINE HCL 5 MG PO TABS
5.0000 mg | ORAL_TABLET | Freq: Three times a day (TID) | ORAL | Status: DC
  Administered 2023-09-16: 5 mg via ORAL
  Filled 2023-09-15 (×3): qty 1

## 2023-09-15 MED ORDER — FLUDROCORTISONE ACETATE 0.1 MG PO TABS
0.2000 mg | ORAL_TABLET | Freq: Three times a day (TID) | ORAL | Status: DC
Start: 2023-09-16 — End: 2023-09-20
  Administered 2023-09-16 – 2023-09-19 (×6): 0.2 mg via ORAL
  Filled 2023-09-15 (×8): qty 2

## 2023-09-15 MED ORDER — POTASSIUM CHLORIDE CRYS ER 20 MEQ PO TBCR
40.0000 meq | EXTENDED_RELEASE_TABLET | Freq: Once | ORAL | Status: AC
  Administered 2023-09-15 (×2): 40 meq via ORAL
  Filled 2023-09-15: qty 2

## 2023-09-15 MED ORDER — POTASSIUM CHLORIDE 10 MEQ/100ML IV SOLN
10.0000 meq | INTRAVENOUS | Status: AC
  Administered 2023-09-15 (×4): 10 meq via INTRAVENOUS
  Filled 2023-09-15 (×2): qty 100

## 2023-09-15 MED ORDER — SODIUM CHLORIDE 0.9 % IV BOLUS
1000.0000 mL | Freq: Once | INTRAVENOUS | Status: AC
  Administered 2023-09-15 (×2): 1000 mL via INTRAVENOUS

## 2023-09-15 MED ORDER — ACETAMINOPHEN 650 MG RE SUPP: 650.0000 mg | Freq: Four times a day (QID) | RECTAL | Status: DC | PRN

## 2023-09-15 NOTE — ED Notes (Signed)
 Meds not given at time due because pt is at MRI. Meds will be started on arrival back to unit.

## 2023-09-15 NOTE — ED Notes (Signed)
 Patient transported to CT

## 2023-09-15 NOTE — ED Notes (Signed)
 MD Scheving in the room with patient.

## 2023-09-15 NOTE — Progress Notes (Signed)
 78 y/o M w/ hx DBS for PD (2017) who presented with confusion to [facility]. 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 No seizure activity. I would recommend starting decadron  4 mg q12 for cerebral edema. We can set him up for a clinic visit early next week as he will likely need a brain biopsy in the near future. Does not need urgent surgery

## 2023-09-15 NOTE — ED Notes (Signed)
 patient is yelling out help but will not say anything else. patient looked like he was running a fever on assessing. rectal temp was 100.7 MD Scheving made aware

## 2023-09-15 NOTE — ED Provider Notes (Signed)
 CT with areas of hemorrhage not present this morning's CT - same edema, no sig shift - new hemorrhage - ICH - in the parenchyma.  Seems not associated with deep brain stimulators.  MRI is being done now.    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 Darnella of the neurosurgery service who states that he would like for the patient to be on twice daily dexamethasone  if he is being admitted to the hospital and discharged on it at discharge and they will set the patient up for a follow-up appointment hopefully on Monday, they think he will likely need a biopsy to confirm.  Will discuss with hospitalist for admission given significant hypokalemia continued confusion and bizarre behavior   Cleotilde Rogue, MD 09/15/23 1859

## 2023-09-15 NOTE — H&P (Signed)
 History and Physical    MAVERICK DIEUDONNE FMW:[id number] DOB: Jun 30, 1945 DOA: 09/15/2023  PCP: Beverley Louann ONEIDA, MD   Patient coming from: Home   Chief Complaint: Confusion, speech difficulty   HPI: JARY LOUVIER is a 78 y.o. male with medical history significant for Parkinson's disease with deep brain stimulator, orthostatic hypotension, and BPH who presents with confusion and change in speech.  Patient was noted by family to be confused since yesterday morning.  He has had similar confusion in the setting of UTI previously and this is what they were suspecting.  Today, he remained confused and was noted to have slurred speech.  This concerned the family for possible stroke and he was sent to the ED.  Patient states that he is hungry but has no other acute complaints.  ED Course: Upon arrival to the ED, patient is found to be febrile 38.2 C with normal RR, normal HR, and elevated BP.  Labs are most notable for potassium 2.6, normal creatinine, normal WBC, negative respiratory virus panel, and undetectable ammonia.  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  Neurosurgery (Dr. Darnella) was consulted by the ED physician and recommended starting Decadron  twice daily and having the patient follow-up in the clinic early next week.  Blood and urine cultures were collected in the ED and the patient was given a liter of saline, oral and IV potassium, acetaminophen , Rocephin , and Decadron .  Review of Systems:  All other systems reviewed and apart from HPI, are negative.  Past Medical History:  Diagnosis Date   Agent orange exposure    Anemia    BPH (benign prostatic hypertrophy)    Cancer (HCC)    basal cell temple   Constipation    Depression    Fatigue    d/t Parkinson's   GERD (gastroesophageal reflux disease)    occasional   History of kidney stones    Left ureteral calculus    Lower urinary tract symptoms (LUTS)    Nephrolithiasis    Neuromuscular  disorder (HCC)     parkinson's   Orthostatic hypotension    d/t Parkinson's disease   Orthostatic hypotension    Parkinson's disease (HCC)    Peyronie disease    Pneumonia 2018   Renal cyst, left    S/P deep brain stimulator placement    11-29-2012   Sleep behavior disorder, REM    Wears glasses    Wears hearing aid    BILATERAL-- INTERMITTANT WEARS    Past Surgical History:  Procedure Laterality Date   CATARACT EXTRACTION Bilateral    CHOLECYSTECTOMY  12/21/2014   Procedure: LAPAROSCOPIC CHOLECYSTECTOMY;  Surgeon: Lynda Leos, MD;  Location: [facility];  Service: General;;   COLONOSCOPY     CYSTO/  LEFT URETEROSOCPY/   LEFT URETERAL STENT PLACEMENT/  LASER BLADDER STONES AND EXTRACTION  09/25/2010   CYSTOSCOPY WITH INSERTION OF UROLIFT N/A 03/11/2017   Procedure: CYSTOSCOPY WITH INSERTION OF UROLIFT, LITHOPAXY,STONE OBTAINED;  Surgeon: Matilda Senior, MD;  Location: [facility] [place];  Service: Urology;  Laterality: N/A;   CYSTOSCOPY WITH LITHOLAPAXY N/A 09/12/2020   Procedure: CYSTOSCOPY WITH LITHOLAPAXY, LEFT RETROGRADE PYELOGRAMS, LEFT URETEROSCOPY, AND STENT PLACEMENT;  Surgeon: Cam Morene ORN, MD;  Location: [facility];  Service: Urology;  Laterality: N/A;  REQUESTING 2 HRS   CYSTOSCOPY WITH LITHOLAPAXY Right 07/14/2023   Procedure: CYSTOSCOPY, WITH BLADDER CALCULUS LITHOLAPAXY;  Surgeon: Cam Morene ORN, MD;  Location: [facility];  Service: Urology;  Laterality: Right;  CYSTOLITHOLAPAXY, RIGHT URETEROSCOPY, LASER LITHOTRIPSY, STONE EXTRACTION, RIGHT URETERAL STENT PLACEMENT   CYSTOSCOPY WITH RETROGRADE PYELOGRAM, URETEROSCOPY AND STENT PLACEMENT Left 01/06/2013   Procedure: CYSTOSCOPY WITH RETROGRADE PYELOGRAM, URETEROSCOPY AND STENT PLACEMENT;  Surgeon: Thomasine Oiler, MD;  Location: [facility] [place];  Service: Urology;  Laterality: Left;   CYSTOSCOPY WITH RETROGRADE PYELOGRAM, URETEROSCOPY AND STENT PLACEMENT Left 09/11/2014    Procedure: CYSTOSCOPY WITH LEFT RETROGRADE PYELOGRAM, URETEROSCOPY AND STENT PLACEMENT;  Surgeon: Oliva Oiler, MD;  Location: [facility];  Service: Urology;  Laterality: Left;   CYSTOSCOPY/RETROGRADE/URETEROSCOPY/STONE EXTRACTION WITH BASKET Right 07/14/2023   Procedure: CYSTOSCOPY, WITH CALCULUS REMOVAL USING BASKET;  Surgeon: Cam Morene ORN, MD;  Location: [facility];  Service: Urology;  Laterality: Right;   CYSTOSCOPY/URETEROSCOPY/HOLMIUM LASER/STENT PLACEMENT Right 07/14/2023   Procedure: CYSTOSCOPY/URETEROSCOPY/HOLMIUM LASER/STENT PLACEMENT;  Surgeon: Cam Morene ORN, MD;  Location: [facility];  Service: Urology;  Laterality: Right;   DEEP BRAIN STIMULATOR PLACEMENT  11/29/2012   genertor device at left pectoral area-   EXTRACORPOREAL SHOCK WAVE LITHOTRIPSY Left 10-11-2010  &  01-05-2011   HOLMIUM LASER APPLICATION Left 01/06/2013   Procedure: HOLMIUM LASER APPLICATION;  Surgeon: Thomasine Oiler, MD;  Location: [facility];  Service: Urology;  Laterality: Left;   HOLMIUM LASER APPLICATION Left 09/11/2014   Procedure: HOLMIUM LASER APPLICATION;  Surgeon: Oliva Oiler, MD;  Location: [facility];  Service: Urology;  Laterality: Left;   HOLMIUM LASER APPLICATION N/A 03/11/2017   Procedure: HOLMIUM LASER APPLICATION;  Surgeon: Matilda Senior, MD;  Location: [facility];  Service: Urology;  Laterality: N/A;   NEGATIVE SLEEP STUDY  2013  per pt   NEPHROLITHOTOMY Right 08/13/2022   Procedure: RIGHT NEPHROLITHOTOMY PERCUTANEOUS;  Surgeon: Cam Morene ORN, MD;  Location: [facility];  Service: Urology;  Laterality: Right;  210 MINUTES   SP PERC NEPHROSTOMY Left 12/16/2012   STONE EXTRACTION WITH BASKET Left 01/06/2013   Procedure: STONE EXTRACTION WITH BASKET;  Surgeon: Thomasine Oiler, MD;  Location: [facility] [place];  Service: Urology;  Laterality: Left;   TONSILLECTOMY     TRANSURETHRAL RESECTION OF PROSTATE N/A 09/12/2020    Procedure: TRANSURETHRAL RESECTION OF THE PROSTATE (TURP);  Surgeon: Cam Morene ORN, MD;  Location: [facility];  Service: Urology;  Laterality: N/A;    Social History:   reports that he has never smoked. He has never used smokeless tobacco. He reports that he does not currently use alcohol . He reports that he does not use drugs.  Allergies  Allergen Reactions   Rivastigmine Tartrate Nausea And Vomiting     Can tolerate the patch but can not tolerate the pills   Tamsulosin Other (See Comments)    Severe joint pain   Alfuzosin      Other reaction(s): Other (See Comments) hypotension    Family History  Problem Relation Age of Onset   Hypertension Mother    Dementia Father    Cancer Father    Heart attack Sister    Arthritis/Rheumatoid Sister      Prior to Admission medications   Medication Sig Start Date End Date Taking? Authorizing Provider  carbidopa -levodopa  (SINEMET  IR) 25-100 MG tablet 2-2.5 tablets 4 times per day at 7am/10:30am/2pm/5:30pm Patient taking differently: 2-2.5 tablets 4 times per day at 6am/10:30am/2pm/6pm 04/06/23  Yes Tat, Asberry RAMAN, DO  cyanocobalamin  1000 MCG tablet Take 1 tablet (1,000 mcg total) by mouth daily. 07/11/23  Yes Rizwan, Saima, MD  fludrocortisone  (FLORINEF ) 0.1 MG tablet Take 2  tablets (0.2 mg total) by mouth 3 (three) times daily with meals. 07/10/23  Yes Rizwan, Saima, MD  Levodopa  (INBRIJA ) 42 MG CAPS We are going to start inbrija .  Remember that TWO capsules is ONE dosage (never inhale just one capsule).  You can inhale the capsules as needed up to 5 times per day, separated by 2 hour intervals. 08/30/23  Yes Tat, Asberry RAMAN, DO  midodrine  (PROAMATINE ) 5 MG tablet Take 1 tablet (5 mg total) by mouth 3 (three) times daily with meals. 08/30/23  Yes TatAsberry RAMAN, DO    Physical Exam: Vitals:   09/15/23 1800 09/15/23 1830 09/15/23 1915 09/15/23 2048  BP: (!) 180/90 (!) 184/90 (!) 165/120 (!) 176/98  Pulse: 72 74 77 75  Resp:  (!) 9 19  14   Temp:    99 F (37.2 C)  TempSrc:    Oral  SpO2: 97% 97% 94% 99%  Weight:      Height:        Constitutional: NAD, no pallor or diaphoresis   Eyes: PERTLA, lids and conjunctivae normal ENMT: Mucous membranes are moist. Posterior pharynx clear of any exudate or lesions.   Neck: supple, no masses  Respiratory: no wheezing, no crackles. No accessory muscle use.  Cardiovascular: S1 & S2 heard, regular rate and rhythm. No extremity edema.   Abdomen: No tenderness, soft. Bowel sounds active.  Musculoskeletal: no clubbing / cyanosis. No joint deformity upper and lower extremities.   Skin: no significant rashes, lesions, ulcers. Warm, dry, well-perfused. Neurologic: Mild dysarthria, no gross facial asymmetry. Moving all extremities. Alert and oriented to person and place only.  Psychiatric: Calm. Cooperative.    Labs and Imaging on Admission: I have personally reviewed following labs and imaging studies  CBC: Recent Labs  Lab 09/15/23 0850  WBC 7.6  HGB 12.2*  HCT 35.7*  MCV 94.4  PLT 263   Basic Metabolic Panel: Recent Labs  Lab 09/15/23 0850 09/15/23 1000 09/15/23 1912  NA 140  --  140  K 2.6*  --  3.7  CL 103  --  104  CO2 28  --  27  GLUCOSE 95  --  95  BUN 18  --  14  CREATININE 0.83  --  0.79  CALCIUM  8.3*  --  8.0*  MG  --  2.0  --    GFR: Estimated Creatinine Clearance: 75.2 mL/min (by C-G formula based on SCr of 0.79 mg/dL). Liver Function Tests: Recent Labs  Lab 09/15/23 0850  AST 15  ALT 10  ALKPHOS 62  BILITOT 1.3*  PROT 6.1*  ALBUMIN 3.2*   No results for input(s): LIPASE, AMYLASE in the last 168 hours. Recent Labs  Lab 09/15/23 1000  AMMONIA <13   Coagulation Profile: No results for input(s): INR, PROTIME in the last 168 hours. Cardiac Enzymes: No results for input(s): CKTOTAL, CKMB, CKMBINDEX, TROPONINI in the last 168 hours. BNP (last 3 results) No results for input(s): PROBNP in the last 8760  hours. HbA1C: No results for input(s): HGBA1C in the last 72 hours. CBG: Recent Labs  Lab 09/15/23 0852  GLUCAP 101*   Lipid Profile: No results for input(s): CHOL, HDL, LDLCALC, TRIG, CHOLHDL, LDLDIRECT in the last 72 hours. Thyroid  Function Tests: Recent Labs    09/15/23 1000  TSH 1.255   Anemia Panel: No results for input(s): VITAMINB12, FOLATE, FERRITIN, TIBC, IRON, RETICCTPCT in the last 72 hours. Urine analysis:    Component Value Date/Time   COLORURINE YELLOW 09/15/2023 1032  APPEARANCEUR HAZY (A) 09/15/2023 1032   LABSPEC 1.015 09/15/2023 1032   PHURINE 7.5 09/15/2023 1032   GLUCOSEU 100 (A) 09/15/2023 1032   HGBUR NEGATIVE 09/15/2023 1032   BILIRUBINUR NEGATIVE 09/15/2023 1032   KETONESUR NEGATIVE 09/15/2023 1032   PROTEINUR NEGATIVE 09/15/2023 1032   UROBILINOGEN 0.2 12/15/2012 1201   NITRITE NEGATIVE 09/15/2023 1032   LEUKOCYTESUR NEGATIVE 09/15/2023 1032   Sepsis Labs: @LABRCNTIP (procalcitonin:4,lacticidven:4) ) Recent Results (from the past 240 hours)  Resp panel by RT-PCR (RSV, Flu A&B, Covid) Anterior Nasal Swab     Status: None   Collection Time: 09/15/23  9:20 AM   Specimen: Anterior Nasal Swab  Result Value Ref Range Status   SARS Coronavirus 2 by RT PCR NEGATIVE NEGATIVE Final    Comment: (NOTE) SARS-CoV-2 target nucleic acids are NOT DETECTED.  The SARS-CoV-2 RNA is generally detectable in upper respiratory specimens during the acute phase of infection. The lowest concentration of SARS-CoV-2 viral copies this assay can detect is 138 copies/mL. A negative result does not preclude SARS-Cov-2 infection and should not be used as the sole basis for treatment or other patient management decisions. A negative result may occur with  improper specimen collection/handling, submission of specimen other than nasopharyngeal swab, presence of viral mutation(s) within the areas targeted by this assay, and inadequate number of  viral copies(<138 copies/mL). A negative result must be combined with clinical observations, patient history, and epidemiological information. The expected result is Negative.  Fact Sheet for Patients:  BloggerCourse.com  Fact Sheet for Healthcare Providers:  SeriousBroker.it  This test is no t yet approved or cleared by the [place]  FDA and  has been authorized for detection and/or diagnosis of SARS-CoV-2 by FDA under an Emergency Use Authorization (EUA). This EUA will remain  in effect (meaning this test can be used) for the duration of the COVID-19 declaration under Section 564(b)(1) of the Act, 21 U.S.C.section 360bbb-3(b)(1), unless the authorization is terminated  or revoked sooner.       Influenza A by PCR NEGATIVE NEGATIVE Final   Influenza B by PCR NEGATIVE NEGATIVE Final    Comment: (NOTE) The Xpert Xpress SARS-CoV-2/FLU/RSV plus assay is intended as an aid in the diagnosis of influenza from Nasopharyngeal swab specimens and should not be used as a sole basis for treatment. Nasal washings and aspirates are unacceptable for Xpert Xpress SARS-CoV-2/FLU/RSV testing.  Fact Sheet for Patients: BloggerCourse.com  Fact Sheet for Healthcare Providers: SeriousBroker.it  This test is not yet approved or cleared by the [place]  FDA and has been authorized for detection and/or diagnosis of SARS-CoV-2 by FDA under an Emergency Use Authorization (EUA). This EUA will remain in effect (meaning this test can be used) for the duration of the COVID-19 declaration under Section 564(b)(1) of the Act, 21 U.S.C. section 360bbb-3(b)(1), unless the authorization is terminated or revoked.     Resp Syncytial Virus by PCR NEGATIVE NEGATIVE Final    Comment: (NOTE) Fact Sheet for Patients: BloggerCourse.com  Fact Sheet for Healthcare  Providers: SeriousBroker.it  This test is not yet approved or cleared by the [place]  FDA and has been authorized for detection and/or diagnosis of SARS-CoV-2 by FDA under an Emergency Use Authorization (EUA). This EUA will remain in effect (meaning this test can be used) for the duration of the COVID-19 declaration under Section 564(b)(1) of the Act, 21 U.S.C. section 360bbb-3(b)(1), unless the authorization is terminated or revoked.  Performed at [facility], [address] [place], [place] [address]  Assessment/Plan   1. 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 Hypokalemia  - Corrected   3. Fever  - Transient fever noted; no other SIRS criteria or apparent infection  - Follow cultures and clinical course off of antibiotics for now   4. Orthostatic hypotension  - Florinef , midodrine    5. Parkinson disease  - Sinemet      DVT prophylaxis: Lovenox   Code Status: Full, discussed with patient and his wife in ED  Level of Care: Level of care: Telemetry Family Communication: Wife and grandson at bedside  Disposition Plan:  Patient is from: home  Anticipated d/c is to: TBD Anticipated d/c date is: 8/14 or 09/17/23  Patient currently: Pending therapy evaluations and disposition planning   Consults called: Neurosurgery  Admission status: observation     Evalene GORMAN Sprinkles, MD [facility]  09/15/2023, 10:03 PM

## 2023-09-15 NOTE — ED Notes (Signed)
 Neurostimulators turned back on after patient arrived back from MRI.

## 2023-09-15 NOTE — Group Note (Deleted)
 Date:  09/15/2023 Time:  2:22 PM  Group Topic/Focus:  Wellness Toolbox:   The focus of this group is to discuss various aspects of wellness, balancing those aspects and exploring ways to increase the ability to experience wellness.  Patients will create a wellness toolbox for use upon discharge.     Participation Level:  {BHH PARTICIPATION OZCZO:77735}  Participation Quality:  {BHH PARTICIPATION QUALITY:22265}  Affect:  {BHH AFFECT:22266}  Cognitive:  {BHH COGNITIVE:22267}  Insight: {BHH Insight2:20797}  Engagement in Group:  {BHH ENGAGEMENT IN HMNLE:77731}  Modes of Intervention:  {BHH MODES OF INTERVENTION:22269}  Additional Comments:  ***  Myra Curtistine BROCKS 09/15/2023, 2:22 PM

## 2023-09-15 NOTE — ED Triage Notes (Signed)
 Per [facility] pt woke up w/ confusion this AM. Pt LKW 2130 last night. Pt w/ hx of parkinsons. Pt c/o of burning w/ urination and pain to left arm from covid vaccine yesterday.

## 2023-09-15 NOTE — ED Notes (Signed)
 Patient has a globus pallidus, (Neurostimulator) implanted in his chest for parkinson's.

## 2023-09-15 NOTE — ED Provider Notes (Signed)
 [place] EMERGENCY DEPARTMENT AT [facility] Provider Note  CSN: [id number] Arrival date & time: 09/15/23 9160  Chief Complaint(s) Altered Mental Status  HPI [name] [name] is a 78 y.o. male history of Parkinson's disease, GERD, deep brain stimulator presented to the emergency department with confusion.  Patient reports that he feels like he just cannot get the words out right, feels weak and tired.  Did get COVID-vaccine yesterday.  Denies headache, nausea, vomiting, fevers, chills.  Does report some burning with urination, no flank pain.  Denies similar episode.  Denies falls.  Denies any focal weakness.  Last known normal was 9:30 PM last night.  Family was concerned that he might of had a stroke.   Past Medical History Past Medical History:  Diagnosis Date   Agent orange exposure    Anemia    BPH (benign prostatic hypertrophy)    Cancer (HCC)    basal cell temple   Constipation    Depression    Fatigue    d/t Parkinson's   GERD (gastroesophageal reflux disease)    occasional   History of kidney stones    Left ureteral calculus    Lower urinary tract symptoms (LUTS)    Nephrolithiasis    Neuromuscular disorder (HCC)     parkinson's   Orthostatic hypotension    d/t Parkinson's disease   Orthostatic hypotension    Parkinson's disease (HCC)    Peyronie disease    Pneumonia 2018   Renal cyst, left    S/P deep brain stimulator placement    11-29-2012   Sleep behavior disorder, REM    Wears glasses    Wears hearing aid    BILATERAL-- INTERMITTANT WEARS   Patient Active Problem List   Diagnosis Date Noted   Hypophonia 07/11/2023   Anemia 07/06/2023   Acute metabolic encephalopathy 07/06/2023   Nephrolithiasis 08/13/2022   Bladder stones 09/13/2020   Gross hematuria 09/12/2020   Orthostatic hypotension 10/27/2019   Acute pyelonephritis 12/16/2012   Sepsis (HCC) 12/15/2012   Parkinson's disease (HCC) 12/15/2012   Home Medication(s) Prior to Admission  medications   Medication Sig Start Date End Date Taking? Authorizing Provider  carbidopa -levodopa  (SINEMET  IR) 25-100 MG tablet 2-2.5 tablets 4 times per day at 7am/10:30am/2pm/5:30pm Patient taking differently: 2-2.5 tablets 4 times per day at 6am/10:30am/2pm/6pm 04/06/23  Yes Tat, Asberry RAMAN, DO  cyanocobalamin  1000 MCG tablet Take 1 tablet (1,000 mcg total) by mouth daily. 07/11/23  Yes Rizwan, Saima, MD  fludrocortisone  (FLORINEF ) 0.1 MG tablet Take 2 tablets (0.2 mg total) by mouth 3 (three) times daily with meals. 07/10/23  Yes Rizwan, Saima, MD  Levodopa  (INBRIJA ) 42 MG CAPS We are going to start inbrija .  Remember that TWO capsules is ONE dosage (never inhale just one capsule).  You can inhale the capsules as needed up to 5 times per day, separated by 2 hour intervals. 08/30/23  Yes Tat, Asberry RAMAN, DO  midodrine  (PROAMATINE ) 5 MG tablet Take 1 tablet (5 mg total) by mouth 3 (three) times daily with meals. 08/30/23  Yes Tat, Asberry RAMAN, DO  Past Surgical History Past Surgical History:  Procedure Laterality Date   CATARACT EXTRACTION Bilateral    CHOLECYSTECTOMY  12/21/2014   Procedure: LAPAROSCOPIC CHOLECYSTECTOMY;  Surgeon: Lynda Leos, MD;  Location: [facility];  Service: General;;   COLONOSCOPY     CYSTO/  LEFT URETEROSOCPY/   LEFT URETERAL STENT PLACEMENT/  LASER BLADDER STONES AND EXTRACTION  09/25/2010   CYSTOSCOPY WITH INSERTION OF UROLIFT N/A 03/11/2017   Procedure: CYSTOSCOPY WITH INSERTION OF UROLIFT, LITHOPAXY,STONE OBTAINED;  Surgeon: Matilda Senior, MD;  Location: [facility];  Service: Urology;  Laterality: N/A;   CYSTOSCOPY WITH LITHOLAPAXY N/A 09/12/2020   Procedure: CYSTOSCOPY WITH LITHOLAPAXY, LEFT RETROGRADE PYELOGRAMS, LEFT URETEROSCOPY, AND STENT PLACEMENT;  Surgeon: Cam Morene ORN, MD;  Location: [facility];   Service: Urology;  Laterality: N/A;  REQUESTING 2 HRS   CYSTOSCOPY WITH LITHOLAPAXY Right 07/14/2023   Procedure: CYSTOSCOPY, WITH BLADDER CALCULUS LITHOLAPAXY;  Surgeon: Cam Morene ORN, MD;  Location: [facility];  Service: Urology;  Laterality: Right;  CYSTOLITHOLAPAXY, RIGHT URETEROSCOPY, LASER LITHOTRIPSY, STONE EXTRACTION, RIGHT URETERAL STENT PLACEMENT   CYSTOSCOPY WITH RETROGRADE PYELOGRAM, URETEROSCOPY AND STENT PLACEMENT Left 01/06/2013   Procedure: CYSTOSCOPY WITH RETROGRADE PYELOGRAM, URETEROSCOPY AND STENT PLACEMENT;  Surgeon: Thomasine Oiler, MD;  Location: [facility];  Service: Urology;  Laterality: Left;   CYSTOSCOPY WITH RETROGRADE PYELOGRAM, URETEROSCOPY AND STENT PLACEMENT Left 09/11/2014   Procedure: CYSTOSCOPY WITH LEFT RETROGRADE PYELOGRAM, URETEROSCOPY AND STENT PLACEMENT;  Surgeon: Oliva Oiler, MD;  Location: [facility];  Service: Urology;  Laterality: Left;   CYSTOSCOPY/RETROGRADE/URETEROSCOPY/STONE EXTRACTION WITH BASKET Right 07/14/2023   Procedure: CYSTOSCOPY, WITH CALCULUS REMOVAL USING BASKET;  Surgeon: Cam Morene ORN, MD;  Location: [facility];  Service: Urology;  Laterality: Right;   CYSTOSCOPY/URETEROSCOPY/HOLMIUM LASER/STENT PLACEMENT Right 07/14/2023   Procedure: CYSTOSCOPY/URETEROSCOPY/HOLMIUM LASER/STENT PLACEMENT;  Surgeon: Cam Morene ORN, MD;  Location: [facility];  Service: Urology;  Laterality: Right;   DEEP BRAIN STIMULATOR PLACEMENT  11/29/2012   genertor device at left pectoral area-   EXTRACORPOREAL SHOCK WAVE LITHOTRIPSY Left 10-11-2010  &  01-05-2011   HOLMIUM LASER APPLICATION Left 01/06/2013   Procedure: HOLMIUM LASER APPLICATION;  Surgeon: Thomasine Oiler, MD;  Location: [facility];  Service: Urology;  Laterality: Left;   HOLMIUM LASER APPLICATION Left 09/11/2014   Procedure: HOLMIUM LASER APPLICATION;  Surgeon: Oliva Oiler, MD;  Location: [facility];  Service: Urology;  Laterality: Left;    HOLMIUM LASER APPLICATION N/A 03/11/2017   Procedure: HOLMIUM LASER APPLICATION;  Surgeon: Matilda Senior, MD;  Location: [facility];  Service: Urology;  Laterality: N/A;   NEGATIVE SLEEP STUDY  2013  per pt   NEPHROLITHOTOMY Right 08/13/2022   Procedure: RIGHT NEPHROLITHOTOMY PERCUTANEOUS;  Surgeon: Cam Morene ORN, MD;  Location: [facility];  Service: Urology;  Laterality: Right;  210 MINUTES   SP PERC NEPHROSTOMY Left 12/16/2012   STONE EXTRACTION WITH BASKET Left 01/06/2013   Procedure: STONE EXTRACTION WITH BASKET;  Surgeon: Thomasine Oiler, MD;  Location: [facility];  Service: Urology;  Laterality: Left;   TONSILLECTOMY     TRANSURETHRAL RESECTION OF PROSTATE N/A 09/12/2020   Procedure: TRANSURETHRAL RESECTION OF THE PROSTATE (TURP);  Surgeon: Cam Morene ORN, MD;  Location: [facility];  Service: Urology;  Laterality: N/A;   Family History Family History  Problem Relation Age of Onset   Hypertension Mother    Dementia Father    Cancer Father    Heart attack Sister  Arthritis/Rheumatoid Sister     Social History Social History   Tobacco Use   Smoking status: Never   Smokeless tobacco: Never  Vaping Use   Vaping status: Never Used  Substance Use Topics   Alcohol  use: Not Currently    Comment: rarely beer   Drug use: No   Allergies Rivastigmine tartrate, Tamsulosin, and Alfuzosin   Review of Systems Review of Systems  All other systems reviewed and are negative.   Physical Exam Vital Signs  I have reviewed the triage vital signs BP (!) 175/93   Pulse 84   Temp (!) 100.7 F (38.2 C) (Rectal)   Resp 16   Ht 5' 10 (1.778 m)   Wt 71 kg   SpO2 95%   BMI 22.46 kg/m  Physical Exam Vitals and nursing note reviewed.  Constitutional:      General: He is not in acute distress.    Appearance: Normal appearance. He is ill-appearing.  HENT:     Mouth/Throat:     Mouth: Mucous membranes are dry.  Eyes:      Conjunctiva/sclera: Conjunctivae normal.  Cardiovascular:     Rate and Rhythm: Normal rate and regular rhythm.  Pulmonary:     Effort: Pulmonary effort is normal. No respiratory distress.     Breath sounds: Normal breath sounds.  Abdominal:     General: Abdomen is flat.     Palpations: Abdomen is soft.     Tenderness: There is no abdominal tenderness. There is no right CVA tenderness or left CVA tenderness.  Musculoskeletal:     Right lower leg: No edema.     Left lower leg: No edema.  Skin:    General: Skin is warm and dry.     Capillary Refill: Capillary refill takes less than 2 seconds.  Neurological:     Mental Status: He is alert and oriented to person, place, and time.     Comments: Hypophonic and quiet speech, but seems fluent.  Strength 5 out of 5 in the bilateral upper and lower extremities.  Cranial nerves II through XII intact.  No dysmetria  Psychiatric:        Mood and Affect: Mood normal.        Behavior: Behavior normal.     ED Results and Treatments Labs (all labs ordered are listed, but only abnormal results are displayed) Labs Reviewed  COMPREHENSIVE METABOLIC PANEL WITH GFR - Abnormal; Notable for the following components:      Result Value   Potassium 2.6 (*)    Calcium  8.3 (*)    Total Protein 6.1 (*)    Albumin 3.2 (*)    Total Bilirubin 1.3 (*)    All other components within normal limits  CBC - Abnormal; Notable for the following components:   RBC 3.78 (*)    Hemoglobin 12.2 (*)    HCT 35.7 (*)    All other components within normal limits  URINALYSIS, W/ REFLEX TO CULTURE (INFECTION SUSPECTED) - Abnormal; Notable for the following components:   APPearance HAZY (*)    Glucose, UA 100 (*)    Bacteria, UA RARE (*)    All other components within normal limits  CBG MONITORING, ED - Abnormal; Notable for the following components:   Glucose-Capillary 101 (*)    All other components within normal limits  RESP PANEL BY RT-PCR (RSV, FLU A&B, COVID)   RVPGX2  CULTURE, BLOOD (ROUTINE X 2)  CULTURE, BLOOD (ROUTINE X 2)  URINE CULTURE  TSH  AMMONIA  ETHANOL  MAGNESIUM  CBG MONITORING, ED                                                                                                                          Radiology CT Head Wo Contrast Result Date: 09/15/2023 CLINICAL DATA:  Mental status change, unknown cause. EXAM: CT HEAD WITHOUT CONTRAST TECHNIQUE: Contiguous axial images were obtained from the base of the skull through the vertex without intravenous contrast. RADIATION DOSE REDUCTION: This exam was performed according to the departmental dose-optimization program which includes automated exposure control, adjustment of the mA and/or kV according to patient size and/or use of iterative reconstruction technique. COMPARISON:  CT head from 07/05/2023. FINDINGS: Brain: There is new vasogenic cerebral edema involving the left superior frontal lobe. This finding is new since the prior study. Differential diagnosis includes neoplastic process, infection, inflammation, etc. Further evaluation with contrast-enhanced MRI is recommended. No evidence of intraparenchymal hemorrhage, hydrocephalus, extra-axial collection or midline shift. There is bilateral periventricular hypodensity, which is non-specific but most likely seen in the settings of microvascular ischemic changes. Mild in extent. Otherwise normal appearance of brain parenchyma. Ventricles are normal. Cerebral volume is age appropriate. Vascular: No hyperdense vessel or unexpected calcification. Skull: Normal. Negative for fracture or focal lesion. Sinuses/Orbits: No acute finding. Mild mucoperiosteal thickening noted in the bilateral ethmoidal air cells. Other: Stable appearance of frontal approach bilateral deep brain stimulator leads. Visualized mastoid air cells are unremarkable. No mastoid effusion. IMPRESSION: 1. New vasogenic cerebral edema involving the left superior frontal lobe.  Differential diagnosis includes neoplastic process, infection, inflammation, etc. Further evaluation with contrast-enhanced MRI is recommended. 2. No acute intracranial hemorrhage. No midline shift. Electronically Signed   By: Ree Molt M.D.   On: 09/15/2023 10:02   DG Chest Port 1 View Result Date: 09/15/2023 CLINICAL DATA:  Weakness. EXAM: PORTABLE CHEST 1 VIEW COMPARISON:  05/16/2023. FINDINGS: Bilateral lung fields are clear. Bilateral costophrenic angles are clear. Normal cardio-mediastinal silhouette. No acute osseous abnormalities. The soft tissues are within normal limits. Redemonstration of 2 battery packs overlying the upper chest with the leads extending superiorly towards the neck. IMPRESSION: *No active disease. Electronically Signed   By: Ree Molt M.D.   On: 09/15/2023 09:56    Pertinent labs & imaging results that were available during my care of the patient were reviewed by me and considered in my medical decision making (see MDM for details).  Medications Ordered in ED Medications  potassium chloride  10 mEq in 100 mL IVPB (has no administration in time range)  cefTRIAXone  (ROCEPHIN ) 1 g in sodium chloride  0.9 % 100 mL IVPB (has no administration in time range)  sodium chloride  0.9 % bolus 1,000 mL (0 mLs Intravenous Stopped 09/15/23 1115)  potassium chloride  SA (KLOR-CON  M) CR tablet 40 mEq (40 mEq Oral Given 09/15/23 0946)  acetaminophen  (TYLENOL ) suppository 650 mg (650 mg Rectal Given 09/15/23 1413)  iohexol  (OMNIPAQUE ) 300 MG/ML solution 75 mL (75 mLs Intravenous Contrast  Given 09/15/23 1459)                                                                                                                                     Procedures .Critical Care  Performed by: Francesca Elsie CROME, MD Authorized by: Francesca Elsie CROME, MD   Critical care provider statement:    Critical care time (minutes):  30   Critical care was necessary to treat or prevent imminent or  life-threatening deterioration of the following conditions:  CNS failure or compromise   Critical care was time spent personally by me on the following activities:  Development of treatment plan with patient or surrogate, discussions with consultants, evaluation of patient's response to treatment, examination of patient, ordering and review of laboratory studies, ordering and review of radiographic studies, ordering and performing treatments and interventions, pulse oximetry, re-evaluation of patient's condition and review of old charts   Care discussed with: admitting provider     (including critical care time)  Medical Decision Making / ED Course   MDM:  78 year old presenting to the emergency department with weakness.  Patient mildly ill-appearing, but no acute distress.  Appears dehydrated.  Physical examination otherwise without focal finding.  No weakness on exam.  Considered intracranial process such as stroke, patient outside TNK window, no LVO.  CT head obtained, does show some unusual vasogenic edema possibly so recommended an MRI, will order this.  Differential also includes occult infectious process such as pneumonia, urine infection.  Chest x-ray is clear.  Will check urinalysis.  Differential also includes metabolic process, does have mild hypokalemia, further testing is reassuring including normal alcohol  level, normal ammonia level.  Will reassess.  Given weakness and abnormal head CT will likely need admission discussed with MRI tech regarding lead safety, has had MRI [facility] with lead in place so hopefully can get MRI today.  Clinical Course as of 09/15/23 1545  Wed Sep 15, 2023  1327 Workup shows vasogenic edema, limited due to DBS, non contrasted CT scan. Discussed with Dr. Michaela with radiology who recommends MRI.  Patient's wife says that both his DBS stimulators are [employer].  She has been in communication with his device rep who states that that they are MRI safe.  MRI  tech has been in conversation with the radiologist.  I discussed with radiologist who feels that that should be safe to have MRI performed.  He will discuss with the MRI tech regarding protocol.  Will check CT head with contrast in interim. [WS]  1539 Signed out to Dr. Cleotilde pending MRI brain, CT head w contrast. Did have single fever but otherwise no clear infectious source. Cultures obtained  [WS]    Clinical Course User Index [WS] Francesca Elsie CROME, MD     Additional history obtained: -Additional history obtained from family and ems -External records from outside source obtained and reviewed including: Chart review including previous notes, labs, imaging,  consultation notes including prior notes    Lab Tests: -I ordered, reviewed, and interpreted labs.   The pertinent results include:   Labs Reviewed  COMPREHENSIVE METABOLIC PANEL WITH GFR - Abnormal; Notable for the following components:      Result Value   Potassium 2.6 (*)    Calcium  8.3 (*)    Total Protein 6.1 (*)    Albumin 3.2 (*)    Total Bilirubin 1.3 (*)    All other components within normal limits  CBC - Abnormal; Notable for the following components:   RBC 3.78 (*)    Hemoglobin 12.2 (*)    HCT 35.7 (*)    All other components within normal limits  URINALYSIS, W/ REFLEX TO CULTURE (INFECTION SUSPECTED) - Abnormal; Notable for the following components:   APPearance HAZY (*)    Glucose, UA 100 (*)    Bacteria, UA RARE (*)    All other components within normal limits  CBG MONITORING, ED - Abnormal; Notable for the following components:   Glucose-Capillary 101 (*)    All other components within normal limits  RESP PANEL BY RT-PCR (RSV, FLU A&B, COVID)  RVPGX2  CULTURE, BLOOD (ROUTINE X 2)  CULTURE, BLOOD (ROUTINE X 2)  URINE CULTURE  TSH  AMMONIA  ETHANOL  MAGNESIUM  CBG MONITORING, ED    Notable for hypokalemia, anemia. No leukocytosis. UA Without signs of UT  EKG   EKG Interpretation Date/Time:     Ventricular Rate:    PR Interval:    QRS Duration:    QT Interval:    QTC Calculation:   R Axis:      Text Interpretation:           Imaging Studies ordered: I ordered imaging studies including CT head  On my interpretation imaging demonstrates edema I independently visualized and interpreted imaging. I agree with the radiologist interpretation   Medicines ordered and prescription drug management: Meds ordered this encounter  Medications   sodium chloride  0.9 % bolus 1,000 mL   potassium chloride  SA (KLOR-CON  M) CR tablet 40 mEq   acetaminophen  (TYLENOL ) suppository 650 mg   DISCONTD: iohexol  (OMNIPAQUE ) 300 MG/ML solution 75 mL   iohexol  (OMNIPAQUE ) 300 MG/ML solution 75 mL   potassium chloride  10 mEq in 100 mL IVPB   cefTRIAXone  (ROCEPHIN ) 1 g in sodium chloride  0.9 % 100 mL IVPB    Antibiotic Indication::   UTI    -I have reviewed the patients home medicines and have made adjustments as needed   Consultations Obtained: I requested consultation with the neurologist,  and discussed lab and imaging findings as well as pertinent plan - they recommend: obtain MRI    Cardiac Monitoring: The patient was maintained on a cardiac monitor.  I personally viewed and interpreted the cardiac monitored which showed an underlying rhythm of: NSR   Reevaluation: After the interventions noted above, I reevaluated the patient and found that their symptoms have stayed the same  Co morbidities that complicate the patient evaluation  Past Medical History:  Diagnosis Date   Agent orange exposure    Anemia    BPH (benign prostatic hypertrophy)    Cancer (HCC)    basal cell temple   Constipation    Depression    Fatigue    d/t Parkinson's   GERD (gastroesophageal reflux disease)    occasional   History of kidney stones    Left ureteral calculus    Lower urinary tract symptoms (LUTS)  Nephrolithiasis    Neuromuscular disorder (HCC)     parkinson's   Orthostatic  hypotension    d/t Parkinson's disease   Orthostatic hypotension    Parkinson's disease (HCC)    Peyronie disease    Pneumonia 2018   Renal cyst, left    S/P deep brain stimulator placement    11-29-2012   Sleep behavior disorder, REM    Wears glasses    Wears hearing aid    BILATERAL-- INTERMITTANT WEARS      Dispostion: Disposition decision including need for hospitalization was considered, and patient admitted to the hospital.    Final Clinical Impression(s) / ED Diagnoses Final diagnoses:  Vasogenic brain edema (HCC)     This chart was dictated using voice recognition software.  Despite best efforts to proofread,  errors can occur which can change the documentation meaning.    Francesca Elsie CROME, MD 09/15/23 [phone]

## 2023-09-16 ENCOUNTER — Telehealth: Payer: Self-pay | Admitting: Neurology

## 2023-09-16 DIAGNOSIS — E860 Dehydration: Secondary | ICD-10-CM | POA: Diagnosis present

## 2023-09-16 DIAGNOSIS — Z515 Encounter for palliative care: Secondary | ICD-10-CM | POA: Diagnosis not present

## 2023-09-16 DIAGNOSIS — Z8249 Family history of ischemic heart disease and other diseases of the circulatory system: Secondary | ICD-10-CM | POA: Diagnosis not present

## 2023-09-16 DIAGNOSIS — T380X5A Adverse effect of glucocorticoids and synthetic analogues, initial encounter: Secondary | ICD-10-CM | POA: Diagnosis not present

## 2023-09-16 DIAGNOSIS — R471 Dysarthria and anarthria: Secondary | ICD-10-CM | POA: Diagnosis present

## 2023-09-16 DIAGNOSIS — Z87440 Personal history of urinary (tract) infections: Secondary | ICD-10-CM | POA: Diagnosis not present

## 2023-09-16 DIAGNOSIS — I10 Essential (primary) hypertension: Secondary | ICD-10-CM | POA: Diagnosis present

## 2023-09-16 DIAGNOSIS — Z7952 Long term (current) use of systemic steroids: Secondary | ICD-10-CM | POA: Diagnosis not present

## 2023-09-16 DIAGNOSIS — R5381 Other malaise: Secondary | ICD-10-CM | POA: Diagnosis present

## 2023-09-16 DIAGNOSIS — Z7189 Other specified counseling: Secondary | ICD-10-CM | POA: Diagnosis not present

## 2023-09-16 DIAGNOSIS — R627 Adult failure to thrive: Secondary | ICD-10-CM | POA: Diagnosis present

## 2023-09-16 DIAGNOSIS — R739 Hyperglycemia, unspecified: Secondary | ICD-10-CM | POA: Diagnosis not present

## 2023-09-16 DIAGNOSIS — R4182 Altered mental status, unspecified: Secondary | ICD-10-CM

## 2023-09-16 DIAGNOSIS — G20A1 Parkinson's disease without dyskinesia, without mention of fluctuations: Secondary | ICD-10-CM | POA: Diagnosis present

## 2023-09-16 DIAGNOSIS — Z8701 Personal history of pneumonia (recurrent): Secondary | ICD-10-CM | POA: Diagnosis not present

## 2023-09-16 DIAGNOSIS — I951 Orthostatic hypotension: Secondary | ICD-10-CM | POA: Diagnosis present

## 2023-09-16 DIAGNOSIS — G936 Cerebral edema: Secondary | ICD-10-CM | POA: Diagnosis present

## 2023-09-16 DIAGNOSIS — D496 Neoplasm of unspecified behavior of brain: Secondary | ICD-10-CM | POA: Diagnosis present

## 2023-09-16 DIAGNOSIS — N400 Enlarged prostate without lower urinary tract symptoms: Secondary | ICD-10-CM | POA: Diagnosis present

## 2023-09-16 DIAGNOSIS — Z87442 Personal history of urinary calculi: Secondary | ICD-10-CM | POA: Diagnosis not present

## 2023-09-16 DIAGNOSIS — Z9079 Acquired absence of other genital organ(s): Secondary | ICD-10-CM | POA: Diagnosis not present

## 2023-09-16 DIAGNOSIS — G9389 Other specified disorders of brain: Secondary | ICD-10-CM | POA: Diagnosis not present

## 2023-09-16 DIAGNOSIS — Z1152 Encounter for screening for COVID-19: Secondary | ICD-10-CM | POA: Diagnosis not present

## 2023-09-16 DIAGNOSIS — F32A Depression, unspecified: Secondary | ICD-10-CM | POA: Diagnosis present

## 2023-09-16 DIAGNOSIS — R509 Fever, unspecified: Secondary | ICD-10-CM | POA: Diagnosis present

## 2023-09-16 DIAGNOSIS — D649 Anemia, unspecified: Secondary | ICD-10-CM | POA: Diagnosis present

## 2023-09-16 DIAGNOSIS — E876 Hypokalemia: Secondary | ICD-10-CM | POA: Diagnosis present

## 2023-09-16 LAB — BASIC METABOLIC PANEL WITH GFR
Anion gap: 8 (ref 5–15)
BUN: 16 mg/dL (ref 8–23)
CO2: 26 mmol/L (ref 22–32)
Calcium: 8.1 mg/dL — ABNORMAL LOW (ref 8.9–10.3)
Chloride: 104 mmol/L (ref 98–111)
Creatinine, Ser: 0.77 mg/dL (ref 0.61–1.24)
GFR, Estimated: >60 mL/min (ref >60)
Glucose, Bld: 127 mg/dL — ABNORMAL HIGH (ref 70–99)
Potassium: 3.1 mmol/L — ABNORMAL LOW (ref 3.5–5.1)
Sodium: 138 mmol/L (ref 135–145)

## 2023-09-16 LAB — CBC
HCT: 34.6 % — ABNORMAL LOW (ref 39.0–52.0)
Hemoglobin: 11.5 g/dL — ABNORMAL LOW (ref 13.0–17.0)
MCH: 31.6 pg (ref 26.0–34.0)
MCHC: 33.2 g/dL (ref 30.0–36.0)
MCV: 95.1 fL (ref 80.0–100.0)
Platelets: 241 K/uL (ref 150–400)
RBC: 3.64 MIL/uL — ABNORMAL LOW (ref 4.22–5.81)
RDW: 12.1 % (ref 11.5–15.5)
WBC: 4 K/uL (ref 4.0–10.5)
nRBC: 0 % (ref 0.0–0.2)

## 2023-09-16 LAB — MAGNESIUM: Magnesium: 2 mg/dL (ref 1.7–2.4)

## 2023-09-16 MED ORDER — CARBIDOPA-LEVODOPA 25-100 MG PO TABS
2.0000 | ORAL_TABLET | Freq: Four times a day (QID) | ORAL | Status: DC
  Administered 2023-09-16 – 2023-09-20 (×19): 2 via ORAL
  Filled 2023-09-16 (×19): qty 2

## 2023-09-16 MED ORDER — POTASSIUM CHLORIDE CRYS ER 20 MEQ PO TBCR
40.0000 meq | EXTENDED_RELEASE_TABLET | Freq: Once | ORAL | Status: AC
  Administered 2023-09-16: 40 meq via ORAL
  Filled 2023-09-16: qty 2

## 2023-09-16 NOTE — Telephone Encounter (Signed)
 Made aware by my MA yesterday that she had to help get MRI arranged due to DBS placement and was able to get that done.  She asked me to look at the MRI as she had seen the results.  I did review.  Also reviewed June CT brain.  Neurosurgery involved and on decadron .  Tried to call wife but no answer.  Just left vm and told her I was aware of findings and was thinking about them and sorry that they were going through this.  Told her no need to call back as they had a lot going on and good medical team.

## 2023-09-16 NOTE — TOC Progression Note (Signed)
 Transition of Care [facility]) - Progression Note    Patient Details  Name: [name] [name] MRN: [id number] Date of Birth: 16-Apr-1945  Transition of [facility] CM/SW Contact  Lucie Lunger, [place] Phone Number: 09/16/2023, 3:02 PM  Clinical Narrative:    CSW updated pts spouse who is with pt at bedside that [facility] did not offer. She and pt do not have another SNF they are interested in and she will take pt home. She states they have been using [facility] for [facility] services and would like to continue this. CSW to speak with [facility] rep Darleene to confirm. TOC to follow.   Expected Discharge Plan: Skilled Nursing Facility Barriers to Discharge: Continued Medical Work up               Expected Discharge Plan and Services In-house Referral: Clinical Social Work Discharge Planning Services: CM Consult Post Acute Care Choice: Skilled Nursing Facility Living arrangements for the past 2 months: Single Family Home                                       Social Drivers of Health (SDOH) Interventions SDOH Screenings   Food Insecurity: No Food Insecurity (09/15/2023)  Housing: Low Risk  (09/15/2023)  Transportation Needs: No Transportation Needs (09/15/2023)  Utilities: Not At Risk (09/15/2023)  Social Connections: Moderately Isolated (09/15/2023)  Tobacco Use: Low Risk  (09/15/2023)    Readmission Risk Interventions    07/10/2023   12:35 PM  Readmission Risk Prevention Plan  Post Dischage Appt Complete  Medication Screening Complete  Transportation Screening Complete

## 2023-09-16 NOTE — Progress Notes (Signed)
 Mobility Specialist Progress Note:    09/16/23 1441  Mobility  Activity Pivoted/transferred to/from [facility];Pivoted/transferred from bed to chair  Level of Assistance Maximum assist, patient does 25-49%  Assistive Device Front wheel walker  Distance Ambulated (ft) 4 ft  Range of Motion/Exercises Active;All extremities  Activity Response Tolerated well  Mobility visit 1 Mobility  Mobility Specialist Start Time (ACUTE ONLY) 1423  Mobility Specialist Stop Time (ACUTE ONLY) 1441  Mobility Specialist Time Calculation (min) (ACUTE ONLY) 18 min   Pt received requesting assistance. Required MaxA to stand and transfer with RW. Tolerated well, pt agreeable to sitting in chair. Alarm on, family in room. All needs met.   Sherrilee Ditty Mobility Specialist Please contact via Special educational needs teacher or  Rehab office at [phone]

## 2023-09-16 NOTE — Hospital Course (Signed)
 78 y.o. male with medical history significant for Parkinson's disease with deep brain stimulator, orthostatic hypotension, and BPH who presents with confusion and change in speech.   Patient was noted by family to be confused since yesterday morning.  He has had similar confusion in the setting of UTI previously and this is what they were suspecting.  Today, he remained confused and was noted to have slurred speech.  This concerned the family for possible stroke and he was sent to the ED.  Patient states that he is hungry but has no other acute complaints.   ED Course: Upon arrival to the ED, patient is found to be febrile 38.2 C with normal RR, normal HR, and elevated BP.  Labs are most notable for potassium 2.6, normal creatinine, normal WBC, negative respiratory virus panel, and undetectable ammonia.  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   Neurosurgery (Dr. Darnella) was consulted by the ED physician and recommended starting Decadron  twice daily and having the patient follow-up in the clinic early next week.  Blood and urine cultures were collected in the ED and the patient was given a liter of saline, oral and IV potassium, acetaminophen , Rocephin , and Decadron .

## 2023-09-16 NOTE — Telephone Encounter (Signed)
 Called patients wife and they are doing a biopsy before the surgery and she was so grateful for Dr. Evonnie reaching out and for my help yesterday

## 2023-09-16 NOTE — Consult Note (Signed)
 Consultation Note Date: 09/16/2023   Patient Name: [name] [name]  DOB: 04/17/1945  MRN: [id number]  Age / Sex: 78 y.o., male  PCP: Beverley Louann ONEIDA, MD Referring Physician: Vicci Afton CROME, MD  Reason for Consultation: Establishing goals of care  HPI/Patient Profile: 78 y.o. male  with past medical history of Parkinson's disease with deep brain stimulator, orthostatic hypotension, and BPH admitted on 09/15/2023 with confusion and changes in speech.   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  Neurosurgery was consulted and recommended initiating Decadron  twice daily and have the patient follow-up as an outpatient.  Patient was admitted due to hypokalemia.  Also undergoing PT/OT/SLP assessment to determine disposition options.  Today, labs reviewed.  Patient noted to have mild hypokalemia at 3.1 mmol/L.  This is decreased from previous obtained 09/15/2023 which was 3.7 mmol/L.  Renal function stable.  Noted to have mild hypocalcemia in the 8 range both today and yesterday.  CBC reveals normal white count.  Mild anemia with a hemoglobin of 11.5.  CT of the head with contrast and MRI of the brain reports independently reviewed.  Findings concerning for a large lesion to left frontal/parietal region of brain.  Findings are worrisome for malignancy.  Vital signs reviewed and have been variable with some intermittent high blood pressure and low blood pressure readings.  Mild bradycardia also noted.  He did have some mild elevations in his temperature as well.  On Decadron  but otherwise no symptom meds required on 24-hour look back.  Independent history obtained from patient's wife as patient is fatigued and speech is very soft.  Patient defers to wife to provide history.  PMT has been consulted to assist with goals of care conversation.  Clinical Assessment and  Goals of Care:  I have reviewed medical records including EPIC notes, labs and imaging (independently reviewed), outpatient specialty notes, prior hospital admissions, medication administration record, vital signs and assessed the patient and then met with patient and wife to discuss diagnosis prognosis, GOC, EOL wishes, disposition and options. Collaborated directly with spiritual care, bedside nursing, attending physician, and TOC.   I introduced Palliative Medicine as specialized medical care for people living with serious illness. It focuses on providing relief from the symptoms and stress of a serious illness. The goal is to improve quality of life for both the patient and the family.  We discussed a brief life review of the patient and then focused on their current illness.   I attempted to elicit values and goals of care important to the patient.    Medical History Review and Family/Patient Understanding:   Patient's wife has a fair understanding of current health concerns.  She is aware that a large area of concern has been noted on brain imaging.  She does seem overwhelmed and is still processing the news.  She states that she was told they would not know what it was until they did a biopsy.  I shared our concerns that this is a brain malignancy.  Social History:  Patient lives at home with wife.  They do have family lives in the area.  They have 2 daughters.  There is 1 daughter who lives close by and helps out.  They also have a grandson Rob who lives with them.  He has Down syndrome.  He does provide some assistance in the home but this is limited.  She also  has another grandson who is currently providing some assistance but may have to leave to return to the [employer] soon.  Functional and Nutritional State:  Patient's wife states that his functional status has been gradually declining as of late.  At baseline he is able to use a specialized walker.  However, she has noticed lately he  has had a lot more trouble getting around and has become more dependent on wheelchair.  She states he can walk short distances with a walker but after sitting for a time or after eating a large meal he will be unable to get up.  They do have a manual wheelchair and an electric wheelchair as well.  They are also working on a better Fish farm manager chair as he is lost about 40 pounds over the past few months/years.  She shares that his functional status varies from moment to moment.  Some days are better than others and she does intermittently have to assist with bathing, dressing, and toileting.  He is able to feed himself but does have trouble with spillage.  They do have adapted utensils which they use to help with this.  They did have a caregiver through the [place] however, patient was not very comfortable with her and asked that she not come back.  Therefore, they do not have any aide services at present.  Appetite is variable with recent weight loss. It is very hard for wife to manage his care all on her own.   Palliative Symptoms:  None reported  Advance Directives:  A detailed discussion regarding advanced directives was had.  We discussed the disease trajectory of Parkinson's disease as well as the implications of a potential brain neoplasm in the setting of Parkinson's disease.    We also discussed what quality of life means for patient and goals of care.  Patient's wife states that they feel like he has a good quality of life right now.  She shares the things that bring him joy and states they continue to enjoy going out to eat and attending shows in the community.  They also enjoy spending time with family.  She shares that he has always said that he would never want to be permanently placed in a skilled nursing facility.  Function and independence are important to patient. She states she promised patient as long as she was able she would care for him at home.    We discussed anticipatory care  planning and that while we are hopeful with steroids and therapy he will improve enough to return home and have good quality of life, our concern is that he will continue to decline and may decline more rapidly.  We discussed the need for some short-term rehab at discharge.  Patient hesitant for long-term care but may be agreeable to short-term rehab facility stay at [facility] if warranted.  We discussed that even after STR he will need increased assistance going forward and I encouraged her to consider her resources through family and the [place] and to reach back out to the home health agency to secure another aide.   Discussed who would speak for patient if unable to speak for himself health surrogate if unable to speak for himself. This will be his wife Aking Klabunde). She is aware of his goals and wishes.   We discussed the limitations and potential burdens of CPR and intubation, particularly in the context of advanced age and serious underlying health conditions (and considering his goal of care to  never be functionally dependent/require skilled nursing facility care).  They have experience with those at the end of life through both of their mothers and witnessed the differences including intubation and mechanical ventilation. She saw first hand how hard that was. However, patient's wife states that he has always said he wanted to be full code and even stated here in the emergency department that he wanted them to do everything possible to stay alive.  She does not want to go against his wishes. However, she indicates that she is aware with recent findings that this may need to be reconsidered.  I did offer to attempt to elicit patient's thoughts and wishes regarding advanced directives in light of recent findings.  However, patient's wife states that she does not feel now is a good time and that they are still trying to process everything.  She agrees that I can come back tomorrow to further  discuss.  Discussed the importance of continued conversation with family and the medical providers regarding overall plan of care and treatment options, ensuring decisions are within the context of the patient's values and GOCs.   Questions and concerns were addressed.  Hard Choices booklet left for review. The family was encouraged to call with questions or concerns.  PMT will continue to support holistically.  I spent 30 minutes providing separately identifiable ACP services with the patient and/or surrogate decision maker in a voluntary, in-person conversation discussing the patient's wishes and goals as detailed in the above note.   Code Status:  Full code/full scope, ongoing discussion  PATIENT, wife if patient unable to speak for himself    SUMMARY OF RECOMMENDATIONS    Full code/full scope Ongoing goals of care discussion Allow time for outcomes/processing of findings Continue PT/OT/ST evaluation and management to determine disposition options Palliative care to continue to follow to address goals of care and symptom management  Code Status/Advance Care Planning: Full code   Symptom Management:  Symptom management per primary team with palliative team available to support as needed.  Symptoms appear reasonly well-managed at present.  Prognosis:  Unable to determine  Discharge Planning: To Be Determined, SNF for short-term rehab versus home with home therapy     Primary Diagnoses: Present on Admission:  Parkinson's disease (HCC)  Hypokalemia  Fever  Orthostatic hypotension    Physical Exam Constitutional:      General: He is not in acute distress.    Appearance: He is not toxic-appearing.  Pulmonary:     Effort: Pulmonary effort is normal.     Breath sounds: Normal breath sounds.  Skin:    General: Skin is warm and dry.  Neurological:     Comments: Appears fatigued, speech is very soft     Vital Signs: BP (!) 152/76 (BP Location: Left Arm)   Pulse 67    Temp 98.1 F (36.7 C) (Oral)   Resp 16   Ht 5' 10 (1.778 m)   Wt 71 kg   SpO2 99%   BMI 22.46 kg/m  Pain Scale: 0-10   Pain Score: 0-No pain   SpO2: SpO2: 99 % O2 Device:SpO2: 99 % O2 Flow Rate: .    Palliative Assessment/Data: 50%    Billing based on MDM: High  Problems Addressed: One acute or chronic illness or injury that poses a threat to life or bodily function  Amount and/or Complexity of Data: Category 1:Review of prior external note(s) from each unique source and Assessment requiring an independent historian(s) and Category 3:Discussion of  management or test interpretation with external physician/other qualified health care professional/appropriate source (not separately reported), independently reviewed imaging/labs  Risks: Advance directive discussion, discussed DNR/DNI vs full code    Laymon CHRISTELLA Pinal, NP  Palliative Medicine Team Team phone # [phone]  Thank you for allowing the Palliative Medicine Team to assist in the care of this patient. Please utilize secure chat with additional questions, if there is no response within 30 minutes please call the above phone number.  Palliative Medicine Team providers are available by phone from 7am to 7pm daily and can be reached through the team cell phone.  Should this patient require assistance outside of these hours, please call the patient's attending physician.

## 2023-09-16 NOTE — TOC CM/SW Note (Signed)
 Transition of Care [facility]) - Inpatient Brief Assessment   Patient Details  Name: [name] [name] MRN: [id number] Date of Birth: 1945-03-16  Transition of Care [facility]) CM/SW Contact:    Lucie Lunger, LCSWA Phone Number: 09/16/2023, 8:50 AM   Clinical Narrative: CSW notes [employer] as primary insurance. CSW notified [facility] of pts hospital admission. VA notification ID is [phone].   Transition of Care Department [facility]) has reviewed patient and no TOC needs have been identified at this time. We will continue to monitor patient advancement through interdiciplinary progression rounds. If new patient transition needs arise, please place a TOC consult.  Transition of Care Asessment: Insurance and Status: Insurance coverage has been reviewed Patient has primary care physician: Yes Home environment has been reviewed: From home Prior level of function:: Independent Prior/Current Home Services: No current home services Social Drivers of Health Review: SDOH reviewed no interventions necessary Readmission risk has been reviewed: Yes Transition of care needs: no transition of care needs at this time

## 2023-09-16 NOTE — TOC Initial Note (Signed)
 Transition of Care [facility]) - Initial/Assessment Note    Patient Details  Name: [name] [name] MRN: [id number] Date of Birth: 1945/09/27  Transition of Care [facility]) CM/SW Contact:    Lucie Lunger, LCSWA Phone Number: 09/16/2023, 11:32 AM  Clinical Narrative:                 CSW updated that PT is recommending SNF for pt. CSW spoke with pts wife who is at bedside with pt to complete assessment. Currently pt has HH PT with [facility]. CSW spoke about PT recommendation and they are agreeable to SNF placement and prefer only [facility]. CSW explained that referral will be sent out for review. TOC to follow.   Expected Discharge Plan: Skilled Nursing Facility Barriers to Discharge: Continued Medical Work up   Patient Goals and CMS Choice Patient states their goals for this hospitalization and ongoing recovery are:: get stronger CMS Medicare.gov Compare Post Acute Care list provided to:: Patient Represenative (must comment) Choice offered to / list presented to : Patient, Spouse  ownership interest in [facility].provided to:: Spouse    Expected Discharge Plan and Services In-house Referral: Clinical Social Work Discharge Planning Services: CM Consult Post Acute Care Choice: Skilled Nursing Facility Living arrangements for the past 2 months: Single Family Home                                      Prior Living Arrangements/Services Living arrangements for the past 2 months: Single Family Home Lives with:: Spouse Patient language and need for interpreter reviewed:: Yes Do you feel safe going back to the place where you live?: Yes      Need for Family Participation in Patient Care: Yes (Comment) Care giver support system in place?: Yes (comment) Current home services: Home PT Criminal Activity/Legal Involvement Pertinent to Current Situation/Hospitalization: No - Comment as needed  Activities of Daily Living   ADL Screening (condition at time of  admission) Independently performs ADLs?: No Does the patient have a NEW difficulty with bathing/dressing/toileting/self-feeding that is expected to last >3 days?: Yes (Initiates electronic notice to provider for possible OT consult) Does the patient have a NEW difficulty with getting in/out of bed, walking, or climbing stairs that is expected to last >3 days?: Yes (Initiates electronic notice to provider for possible PT consult) Does the patient have a NEW difficulty with communication that is expected to last >3 days?: Yes (Initiates electronic notice to provider for possible SLP consult) Is the patient deaf or have difficulty hearing?: No Does the patient have difficulty seeing, even when wearing glasses/contacts?: No Does the patient have difficulty concentrating, remembering, or making decisions?: Yes  Permission Sought/Granted                  Emotional Assessment Appearance:: Appears stated age Attitude/Demeanor/Rapport: Engaged Affect (typically observed): Accepting Orientation: : Oriented to Self, Oriented to Place Alcohol  / Substance Use: Not Applicable Psych Involvement: No (comment)  Admission diagnosis:  Hypokalemia [E87.6] Vasogenic brain edema (HCC) [G93.6] Brain mass [G93.89] 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] Patient Active Problem List   Diagnosis Date Noted   Brain mass 09/15/2023   Hypokalemia 09/15/2023   Fever 09/15/2023   Hypophonia 07/11/2023   Anemia 07/06/2023   Acute metabolic encephalopathy 07/06/2023   Nephrolithiasis 08/13/2022   Bladder stones 09/13/2020   Gross hematuria 09/12/2020   Orthostatic hypotension 10/27/2019   Acute pyelonephritis  12/16/2012   Sepsis (HCC) 12/15/2012   Parkinson's disease (HCC) 12/15/2012   PCP:  Beverley Louann DASEN, MD Pharmacy:   [phone] [place], [place] - [address] AT [facility] [address] [place] [address] Phone: [phone] Fax: [phone]     Social  Drivers of Health (SDOH) Social History: SDOH Screenings   Food Insecurity: No Food Insecurity (09/15/2023)  Housing: Low Risk  (09/15/2023)  Transportation Needs: No Transportation Needs (09/15/2023)  Utilities: Not At Risk (09/15/2023)  Social Connections: Moderately Isolated (09/15/2023)  Tobacco Use: Low Risk  (09/15/2023)   SDOH Interventions:     Readmission Risk Interventions    07/10/2023   12:35 PM  Readmission Risk Prevention Plan  Post Dischage Appt Complete  Medication Screening Complete  Transportation Screening Complete

## 2023-09-16 NOTE — Progress Notes (Signed)
 Mobility Specialist Progress Note:    09/16/23 1631  Mobility  Activity Stood at bedside;Pivoted/transferred from chair to bed  Level of Assistance Maximum assist, patient does 25-49%  Assistive Device Front wheel walker  Distance Ambulated (ft) 3 ft  Range of Motion/Exercises Active;All extremities  Activity Response Tolerated well  Mobility visit 1 Mobility  Mobility Specialist Start Time (ACUTE ONLY) 1616  Mobility Specialist Stop Time (ACUTE ONLY) 1631  Mobility Specialist Time Calculation (min) (ACUTE ONLY) 15 min   Pt received requesting assistance to bed. Required MaxA to stand and transfer with RW. Tolerated well, asx throughout. Alarm on, family in room. All needs met.   Sherrilee Ditty Mobility Specialist Please contact via Special educational needs teacher or  Rehab office at [phone]

## 2023-09-16 NOTE — Telephone Encounter (Signed)
 PT.s wife cld Dr. Evonnie back for Input

## 2023-09-16 NOTE — Evaluation (Signed)
 Physical Therapy Evaluation Patient Details Name: [name] [name] MRN: [id number] DOB: September 29, 1945 Today's Date: 09/16/2023  History of Present Illness  [name] [name] is a 78 y.o. male with medical history significant for Parkinson's disease with deep brain stimulator, orthostatic hypotension, and BPH who presents with confusion and change in speech.     Patient was noted by family to be confused since yesterday morning.  He has had similar confusion in the setting of UTI previously and this is what they were suspecting.  Today, he remained confused and was noted to have slurred speech.  This concerned the family for possible stroke and he was sent to the ED.  Patient states that he is hungry but has no other acute complaints.     ED Course: Upon arrival to the ED, patient is found to be febrile 38.2 C with normal RR, normal HR, and elevated BP.  Labs are most notable for potassium 2.6, normal creatinine, normal WBC, negative respiratory virus panel, and undetectable ammonia.  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     Neurosurgery (Dr. Darnella) was consulted by the ED physician and recommended starting Decadron  twice daily and having the patient follow-up in the clinic early next week.  Blood and urine cultures were collected in the ED and the patient was given a liter of saline, oral and IV potassium, acetaminophen , Rocephin , and Decadron .   Clinical Impression  Patient demonstrates fair return for sitting up at bedside HOB partially raised, very unsteady on feet with difficulty advancing BLE due to festinating gait pattern with scissoring of legs having frequent near loss of balance requiring Mod/max assist on occasion to prevent falling. Patient able to transfer to/from commode in bathroom with use of side rails and tolerated sitting up in chair after therapy with spouse present. Patient will benefit from continued skilled physical therapy in hospital and  recommended venue below to increase strength, balance, endurance for safe ADLs and gait.       If plan is discharge home, recommend the following: A lot of help with walking and/or transfers;A lot of help with bathing/dressing/bathroom;Help with stairs or ramp for entrance;Assistance with cooking/housework   Can travel by private vehicle   Yes    Equipment Recommendations None recommended by PT  Recommendations for Other Services       Functional Status Assessment Patient has had a recent decline in their functional status and demonstrates the ability to make significant improvements in function in a reasonable and predictable amount of time.     Precautions / Restrictions Precautions Precautions: Fall Recall of Precautions/Restrictions: Impaired Restrictions Weight Bearing Restrictions Per Provider Order: No      Mobility  Bed Mobility Overal bed mobility: Needs Assistance Bed Mobility: Supine to Sit     Supine to sit: Contact guard, HOB elevated     General bed mobility comments: increased time with labored movement    Transfers Overall transfer level: Needs assistance Equipment used: Rolling walker (2 wheels) Transfers: Sit to/from Stand, Bed to chair/wheelchair/BSC Sit to Stand: Mod assist   Step pivot transfers: Mod assist       General transfer comment: unsteady labored movement with diffiuclty advancing feet    Ambulation/Gait Ambulation/Gait assistance: Mod assist, Max assist Gait Distance (Feet): 15 Feet Assistive device: Rolling walker (2 wheels) Gait Pattern/deviations: Decreased step length - right, Decreased step length - left, Decreased stride length, Shuffle, Festinating, Narrow base of support, Trunk flexed, Scissoring Gait velocity: decreased  General Gait Details: slow labored movement with festinating gait pattern, very short steps with near loss of balance due to scissoring of legs an limited mostly due to fatigue  Stairs             Wheelchair Mobility     Tilt Bed    Modified Rankin (Stroke Patients Only)       Balance Overall balance assessment: Needs assistance Sitting-balance support: Feet supported, No upper extremity supported Sitting balance-[name] [name] Scale: Fair Sitting balance - Comments: fair/good seated at EOB   Standing balance support: Bilateral upper extremity supported, During functional activity, Reliant on assistive device for balance Standing balance-[name] [name] Scale: Poor Standing balance comment: using RW                             Pertinent Vitals/Pain Pain Assessment Pain Assessment: Faces Faces Pain Scale: Hurts a little bit Pain Location: L arm Pain Descriptors / Indicators: Discomfort Pain Intervention(s): Monitored during session    Home Living Family/patient expects to be discharged to:: Private residence Living Arrangements: Spouse/significant other;Other relatives Available Help at Discharge: Family;Available PRN/intermittently Type of Home: House Home Access: Stairs to enter;Other (comment) Entrance Stairs-Rails: Can reach both Entrance Stairs-Number of Steps: 3   Home Layout: Two level;Able to live on main level with bedroom/bathroom Home Equipment: Shower seat - built in;Grab bars - tub/shower;Wheelchair - Careers adviser (comment) Additional Comments: Wife present 24/7 but uses a RW herself. Darden has been helping as well. No other change reported in living history from prior admission.    Prior Function Prior Level of Function : Needs assist       Physical Assist : Mobility (physical);ADLs (physical) Mobility (physical): Transfers;Gait;Stairs;Bed mobility ADLs (physical): Grooming;Bathing;Dressing;Toileting;IADLs;Feeding Mobility Comments: [place] short distance ambulation at times with parkinson's metronome rollator. Uses w/c as well depending on how he is feeling. Assistance needed at times. ADLs Comments: Assisted for all ADL's by family.      Extremity/Trunk Assessment   Upper Extremity Assessment Upper Extremity Assessment: Defer to OT evaluation RUE Deficits / Details: mild increased R UE shoulder weakness compared to L. 4-/5 shoulder flexion; 4/5 shoulder abduction; 4+/5 otherwise. RUE Coordination: decreased fine motor;decreased gross motor (baseline parkinson's disease.)    Lower Extremity Assessment Lower Extremity Assessment: Generalized weakness;RLE deficits/detail;LLE deficits/detail RLE Deficits / Details: grossly -4/5 RLE Sensation: WNL RLE Coordination: decreased gross motor LLE Deficits / Details: grossly 4-/5 LLE Sensation: WNL LLE Coordination: decreased gross motor    Cervical / Trunk Assessment Cervical / Trunk Assessment: Kyphotic  Communication   Communication Communication: Impaired Factors Affecting Communication: Reduced clarity of speech;Other (comment)    Cognition Arousal: Alert Behavior During Therapy: WFL for tasks assessed/performed                             Following commands: Intact       Cueing Cueing Techniques: Verbal cues, Tactile cues     General Comments General comments (skin integrity, edema, etc.): History of orthostatic hypotension per wife's report. Seated BP noted to be 133/99; after standing BP was taken at 104/78 with some reports of being light headed.    Exercises     Assessment/Plan    PT Assessment Patient needs continued PT services  PT Problem List Decreased strength;Decreased activity tolerance;Decreased balance;Decreased mobility;Decreased coordination       PT Treatment Interventions DME instruction;Gait training;Stair training;Functional mobility training;Therapeutic activities;Therapeutic exercise;Balance training;Patient/family education  PT Goals (Current goals can be found in the Care Plan section)  Acute Rehab PT Goals Patient Stated Goal: return home with family to assist PT Goal Formulation: With patient/family Time For  Goal Achievement: 09/30/23 Potential to Achieve Goals: Good    Frequency Min 3X/week     Co-evaluation PT/OT/SLP Co-Evaluation/Treatment: Yes Reason for Co-Treatment: To address functional/ADL transfers PT goals addressed during session: Mobility/safety with mobility;Balance;Proper use of DME OT goals addressed during session: ADL's and self-care       AM-PAC PT 6 Clicks Mobility  Outcome Measure Help needed turning from your back to your side while in a flat bed without using bedrails?: A Little Help needed moving from lying on your back to sitting on the side of a flat bed without using bedrails?: A Little Help needed moving to and from a bed to a chair (including a wheelchair)?: A Lot Help needed standing up from a chair using your arms (e.g., wheelchair or bedside chair)?: A Lot Help needed to walk in hospital room?: A Lot Help needed climbing 3-5 steps with a railing? : A Lot 6 Click Score: 14    End of Session   Activity Tolerance: Patient tolerated treatment well;Patient limited by fatigue Patient left: in chair;with call bell/phone within reach;with family/visitor present Nurse Communication: Mobility status PT Visit Diagnosis: Unsteadiness on feet (R26.81);Other abnormalities of gait and mobility (R26.89);Muscle weakness (generalized) (M62.81)    Time: 9163-9086 PT Time Calculation (min) (ACUTE ONLY): 37 min   Charges:   PT Evaluation $PT Eval Moderate Complexity: 1 Mod PT Treatments $Therapeutic Activity: 23-37 mins PT General Charges $$ ACUTE PT VISIT: 1 Visit         12:10 PM, 09/16/23 Lynwood Music, MPT Physical Therapist with [facility] 336 [phone] office [phone] mobile phone

## 2023-09-16 NOTE — Plan of Care (Signed)
  Problem: Acute Rehab PT Goals(only PT should resolve) Goal: Pt Will Go Supine/Side To Sit Outcome: Progressing Flowsheets (Taken 09/16/2023 1213) Pt will go Supine/Side to Sit: with supervision Goal: Patient Will Transfer Sit To/From Stand Outcome: Progressing Flowsheets (Taken 09/16/2023 1213) Patient will transfer sit to/from stand:  with minimal assist  with moderate assist Goal: Pt Will Transfer Bed To Chair/Chair To Bed Outcome: Progressing Flowsheets (Taken 09/16/2023 1213) Pt will Transfer Bed to Chair/Chair to Bed:  with min assist  with mod assist Goal: Pt Will Ambulate Outcome: Progressing Flowsheets (Taken 09/16/2023 1213) Pt will Ambulate:  25 feet  with moderate assist  with minimal assist  with rolling walker   12:15 PM, 09/16/23 Lynwood Music, MPT Physical Therapist with [facility] 336 [phone] office [phone] mobile phone

## 2023-09-16 NOTE — Plan of Care (Signed)
  Problem: Acute Rehab OT Goals (only OT should resolve) Goal: Pt. Will Perform Eating Flowsheets (Taken 09/16/2023 1133) Pt Will Perform Eating: with modified independence Goal: Pt. Will Perform Grooming Flowsheets (Taken 09/16/2023 1133) Pt Will Perform Grooming: with modified independence Goal: Pt. Will Perform Lower Body Bathing Flowsheets (Taken 09/16/2023 1133) Pt Will Perform Lower Body Bathing:  with modified independence  with set-up  sitting/lateral leans  with adaptive equipment Goal: Pt. Will Perform Upper Body Dressing Flowsheets (Taken 09/16/2023 1133) Pt Will Perform Upper Body Dressing:  with modified independence  sitting Goal: Pt. Will Perform Lower Body Dressing Flowsheets (Taken 09/16/2023 1133) Pt Will Perform Lower Body Dressing:  with modified independence  with set-up  with adaptive equipment  sitting/lateral leans Goal: Pt. Will Transfer To Toilet Flowsheets (Taken 09/16/2023 1133) Pt Will Transfer to Toilet:  with contact guard assist  ambulating Goal: Pt. Will Perform Toileting-Clothing Manipulation Flowsheets (Taken 09/16/2023 1133) Pt Will Perform Toileting - Clothing Manipulation and hygiene:  with modified independence  sitting/lateral leans  sit to/from stand  with set-up Goal: Pt/Caregiver Will Perform Home Exercise Program Flowsheets (Taken 09/16/2023 1133) Pt/caregiver will Perform Home Exercise Program:  Increased strength  Both right and left upper extremity  Independently  Kadia Abaya OT, MOT

## 2023-09-16 NOTE — Evaluation (Signed)
 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   Hypokalemia 09/15/2023   Fever 09/15/2023   Hypophonia 07/11/2023   Anemia 07/06/2023   Acute metabolic encephalopathy 07/06/2023   Nephrolithiasis 08/13/2022   Bladder stones 09/13/2020   Gross hematuria 09/12/2020   Orthostatic hypotension 10/27/2019   Acute pyelonephritis 12/16/2012   Sepsis (HCC) 12/15/2012   Parkinson's disease (HCC) 12/15/2012   Past Medical History:  Past Medical History:  Diagnosis Date   Agent orange exposure    Anemia    BPH (benign prostatic hypertrophy)    Cancer (HCC)    basal cell temple   Constipation    Depression    Fatigue    d/t Parkinson's   GERD (gastroesophageal reflux disease)    occasional   History of kidney stones    Left ureteral calculus    Lower urinary tract symptoms (LUTS)    Nephrolithiasis    Neuromuscular disorder (HCC)     parkinson's   Orthostatic hypotension    d/t Parkinson's disease   Orthostatic hypotension    Parkinson's disease (HCC)    Peyronie disease    Pneumonia 2018   Renal cyst, left    S/P deep brain stimulator placement    11-29-2012   Sleep behavior disorder, REM    Wears glasses    Wears hearing aid    BILATERAL-- INTERMITTANT WEARS   Past Surgical History:  Past Surgical History:  Procedure Laterality Date   CATARACT EXTRACTION Bilateral    CHOLECYSTECTOMY  12/21/2014   Procedure: LAPAROSCOPIC CHOLECYSTECTOMY;  Surgeon: Lynda Leos, MD;  Location: [facility];  Service: General;;   COLONOSCOPY     CYSTO/  LEFT URETEROSOCPY/   LEFT URETERAL STENT PLACEMENT/  LASER BLADDER STONES AND EXTRACTION  09/25/2010   CYSTOSCOPY WITH INSERTION OF UROLIFT N/A 03/11/2017    Procedure: CYSTOSCOPY WITH INSERTION OF UROLIFT, LITHOPAXY,STONE OBTAINED;  Surgeon: Matilda Senior, MD;  Location: [facility];  Service: Urology;  Laterality: N/A;   CYSTOSCOPY WITH LITHOLAPAXY N/A 09/12/2020   Procedure: CYSTOSCOPY WITH LITHOLAPAXY, LEFT RETROGRADE PYELOGRAMS, LEFT URETEROSCOPY, AND STENT PLACEMENT;  Surgeon: Cam Morene ORN, MD;  Location: [facility];  Service: Urology;  Laterality: N/A;  REQUESTING 2 HRS   CYSTOSCOPY WITH LITHOLAPAXY Right 07/14/2023   Procedure: CYSTOSCOPY, WITH BLADDER CALCULUS LITHOLAPAXY;  Surgeon: Cam Morene ORN, MD;  Location: [facility];  Service: Urology;  Laterality: Right;  CYSTOLITHOLAPAXY, RIGHT URETEROSCOPY, LASER LITHOTRIPSY, STONE EXTRACTION, RIGHT URETERAL STENT PLACEMENT   CYSTOSCOPY WITH RETROGRADE PYELOGRAM, URETEROSCOPY AND STENT PLACEMENT Left 01/06/2013   Procedure: CYSTOSCOPY WITH RETROGRADE PYELOGRAM, URETEROSCOPY AND STENT PLACEMENT;  Surgeon: Thomasine Oiler, MD;  Location[facility];  Service: Urology;  Laterality: Left;   CYSTOSCOPY WITH RETROGRADE PYELOGRAM, URETEROSCOPY AND STENT PLACEMENT Left 09/11/2014   Procedure: CYSTOSCOPY WITH LEFT RETROGRADE PYELOGRAM, URETEROSCOPY AND STENT PLACEMENT;  Surgeon: Oliva Oiler, MD;  [facility];  Service: Urology;  Laterality: Left;   CYSTOSCOPY/RETROGRADE/URETEROSCOPY/STONE EXTRACTION WITH BASKET Right 07/14/2023   Procedure: CYSTOSCOPY, WITH CALCULUS REMOVAL USING BASKET;  Surgeon: Cam Morene ORN, MD;  Location: [facility];  Service: Urology;  Laterality: Right;   CYSTOSCOPY/URETEROSCOPY/HOLMIUM LASER/STENT PLACEMENT Right 07/14/2023   Procedure: CYSTOSCOPY/URETEROSCOPY/HOLMIUM LASER/STENT PLACEMENT;  Surgeon: Cam Morene ORN, MD;  Location: [facility];  Service: Urology;  Laterality: Right;   DEEP BRAIN STIMULATOR PLACEMENT  11/29/2012   genertor device at left pectoral area-   EXTRACORPOREAL SHOCK WAVE LITHOTRIPSY  Left 10-11-2010  &  01-05-2011   HOLMIUM LASER APPLICATION Left 01/06/2013   Procedure: HOLMIUM LASER APPLICATION;  Surgeon: Thomasine Oiler, MD;  Location: [facility];  Service: Urology;  Laterality: Left;   HOLMIUM LASER APPLICATION Left 09/11/2014   Procedure: HOLMIUM LASER APPLICATION;  Surgeon: Oliva Oiler, MD;  Location: [facility];  Service: Urology;  Laterality: Left;   HOLMIUM LASER APPLICATION N/A 03/11/2017   Procedure: HOLMIUM LASER APPLICATION;  Surgeon: Matilda Senior, MD;  Location: [facility];  Service: Urology;  Laterality: N/A;   NEGATIVE SLEEP STUDY  2013  per pt   NEPHROLITHOTOMY Right 08/13/2022   Procedure: RIGHT NEPHROLITHOTOMY PERCUTANEOUS;  Surgeon: Cam Morene ORN, MD;  Location: [facility];  Service: Urology;  Laterality: Right;  210 MINUTES   SP PERC NEPHROSTOMY Left 12/16/2012   STONE EXTRACTION WITH BASKET Left 01/06/2013   Procedure: STONE EXTRACTION WITH BASKET;  Surgeon: Thomasine Oiler, MD;  Location: [facility];  Service: Urology;  Laterality: Left;   TONSILLECTOMY     TRANSURETHRAL RESECTION OF PROSTATE N/A 09/12/2020   Procedure: TRANSURETHRAL RESECTION OF THE PROSTATE (TURP);  Surgeon: Cam Morene ORN, MD;  Location: [facility];  Service: Urology;  Laterality: N/A;   HPI:  78 y.o. male with medical history significant for Parkinson's disease with deep brain stimulator, orthostatic hypotension, and BPH who presents with confusion and change in speech.     Patient was noted by family to be confused since yesterday morning.  He has had similar confusion in the setting of UTI previously and this is what they were suspecting.  Today, he remained confused and was noted to have slurred speech.  This concerned the family for possible stroke and he was sent to the ED.  Patient states that he is hungry but has no other acute complaints.     ED Course: Upon arrival to the ED, patient is  found to be febrile 38.2 C with normal RR, normal HR, and elevated BP.  Labs are most notable for potassium 2.6, normal creatinine, normal WBC, negative respiratory virus panel, and undetectable ammonia.  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     Neurosurgery (Dr. Darnella) was consulted by the ED physician and recommended starting Decadron  twice daily and having the patient follow-up in the clinic early next week.  Blood and urine cultures were collected in the ED and the patient was given a liter of saline, oral and IV potassium, acetaminophen , Rocephin , and Decadron .  SLE ordered.   Assessment / Plan / Recommendation Clinical Impression  Pt presents with mild cognitive deficits, mild/mod expressive language deficits, and moderate/severe motor speech deficits in setting of hypokinetic dysarthria (PD). Pt required max cues to increase vocal intensity as he whispers primarily at this time. He was oriented to current situation, required extra time for processing auditory requests and questions, and had poor awareness of severe breathiness. Pt was able to produce voicing with verbal cues and modeling for sustained phonation of  /a/ to help reset/recalibrate. His wife stated that he follows along with SPEAKOUT! Therapy exercises on [employer] at home, but he would benefit from dedicated 1:1 SLP therapy for dysarthria in next venue of care. Pt also now experiencing some word finding deficits  which is frustrating to him. Pt encouraged to slow down, speak loud, and recalibrate with /a/ periodically. Recommend SLP f/u at next venue of care.    SLP Assessment  SLP Recommendation/Assessment: All further Speech Language Pathology needs can be addressed in the next venue of care SLP Visit Diagnosis: Dysarthria and anarthria (R47.1);Cognitive communication deficit (R41.841)     Assistance Recommended at Discharge  Frequent or constant Supervision/Assistance  Functional  Status Assessment Patient has had a recent decline in their functional status and demonstrates the ability to make significant improvements in function in a reasonable and predictable amount of time.  Frequency and Duration           SLP Evaluation Cognition  Overall Cognitive Status: History of cognitive impairments - at baseline Arousal/Alertness: Awake/alert Orientation Level: Oriented X4 Year: 2025 Month: August Day of Week: Correct Attention: Sustained Sustained Attention: Impaired Sustained Attention Impairment: Functional complex;Verbal complex Memory: Impaired Memory Impairment: Decreased recall of new information Awareness: Appears intact Problem Solving: Appears intact Executive Function: Self Monitoring;Organizing Organizing: Impaired Organizing Impairment: Verbal complex Self Monitoring: Impaired Self Monitoring Impairment: Verbal complex Safety/Judgment: Appears intact       Comprehension  Auditory Comprehension Overall Auditory Comprehension: Impaired Yes/No Questions: Within Functional Limits Commands: Impaired Multistep Basic Commands: 50-74% accurate Conversation: Simple Interfering Components: Working Radio broadcast assistant: Repetition;Extra processing time Visual Recognition/Discrimination Discrimination: Within Function Limits Reading Comprehension Reading Status: Not tested    Expression Expression Primary Mode of Expression: Verbal Verbal Expression Overall Verbal Expression: Impaired Initiation: No impairment Automatic Speech: Name;Social Response;Counting Level of Generative/Spontaneous Verbalization: Sentence Repetition: No impairment Naming: Impairment Responsive: 51-75% accurate Confrontation: Not tested Convergent: Not tested Divergent: 50-74% accurate Pragmatics: No impairment Interfering Components: Speech intelligibility Non-Verbal Means of Communication: Not applicable Written Expression Written Expression: Not tested    Oral / Motor  Oral Motor/Sensory Function Overall Oral Motor/Sensory Function: Generalized oral weakness Facial ROM: Within Functional Limits Facial Symmetry: Within Functional Limits Facial Strength: Reduced right;Reduced left Facial Sensation: Within Functional Limits Lingual ROM: Within Functional Limits Lingual Symmetry: Within Functional Limits Lingual Strength: Reduced Lingual Sensation: Within Functional Limits Mandible: Within Functional Limits Motor Speech Overall Motor Speech: Impaired Respiration: Impaired Level of Impairment: Word Phonation: Low vocal intensity;Breathy Resonance: Within functional limits Articulation: Within functional limitis Intelligibility: Intelligibility reduced Word: 50-74% accurate Phrase: 50-74% accurate Sentence: 25-49% accurate Conversation: 25-49% accurate Motor Planning: Within functional limits Motor Speech Errors: Aware;Unaware Interfering Components: Premorbid status Effective Techniques: Increased vocal intensity   Thank you,  Lamar Candy, CCC-SLP [phone]         Elveta Rape 09/16/2023, 2:15 PM

## 2023-09-16 NOTE — Progress Notes (Addendum)
 PROGRESS NOTE   [name] [name]  FMW:[id number] DOB: September 22, 1945 DOA: 09/15/2023 PCP: Beverley Louann ONEIDA, MD   Chief Complaint  Patient presents with   Altered Mental Status   Level of care: Med-Surg  Brief Admission History:  78 y.o. male with medical history significant for Parkinson's disease with deep brain stimulator, orthostatic hypotension, and BPH who presents with confusion and change in speech.   Patient was noted by family to be confused since yesterday morning.  He has had similar confusion in the setting of UTI previously and this is what they were suspecting.  Today, he remained confused and was noted to have slurred speech.  This concerned the family for possible stroke and he was sent to the ED.  Patient states that he is hungry but has no other acute complaints.   ED Course: Upon arrival to the ED, patient is found to be febrile 38.2 C with normal RR, normal HR, and elevated BP.  Labs are most notable for potassium 2.6, normal creatinine, normal WBC, negative respiratory virus panel, and undetectable ammonia.  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   Neurosurgery (Dr. Darnella) was consulted by the ED physician and recommended starting Decadron  twice daily and having the patient follow-up in the clinic early next week.  Blood and urine cultures were collected in the ED and the patient was given a liter of saline, oral and IV potassium, acetaminophen , Rocephin , and Decadron .   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  - Neurosurgery recommends starting Decadron  for cerebral edema and close outpatient follow-up  - Continue Decadron , consult PT, OT, and SLP, use delirium precautions     Hypokalemia  - repleted    Fever  - Transient fever noted; no other SIRS criteria or apparent infection  - Follow cultures and clinical course off of antibiotics for now    Orthostatic  hypotension  - Florinef , midodrine     Parkinson disease  - Sinemet     Generalized Weakness Adult Failure to Thrive  - given high likelihood of primary CNS neoplasm and poor overall health condition and poor overall prognosis, I am requesting a consultation with palliative care for goals of care discussion - PT assessment confirms severe debilitated state and need for skilled nursing care   DVT prophylaxis: enoxaparin   Code Status: Full  Family Communication: wife updated at bedside Disposition: SNF    Consultants:  Neurosurgery Procedures:   Antimicrobials:    Subjective: Pt severely weakened and nonverbal this morning.   Objective: Vitals:   09/15/23 1830 09/15/23 1915 09/15/23 2048 09/16/23 0441  BP: (!) 184/90 (!) 165/120 (!) 176/98 (!) 152/76  Pulse: 74 77 75 67  Resp: 19  14 16   Temp:   99 F (37.2 C) 98.1 F (36.7 C)  TempSrc:   Oral Oral  SpO2: 97% 94% 99% 99%  Weight:      Height:        Intake/Output Summary (Last 24 hours) at 09/16/2023 1316 Last data filed at 09/15/2023 2032 Gross per 24 hour  Intake 106.75 ml  Output 700 ml  Net -593.25 ml   Filed Weights   09/15/23 0849  Weight: 71 kg   Examination:  General exam: Appears calm and comfortable severely debilitated state.  Respiratory system: Clear to auscultation. Respiratory effort normal. Cardiovascular system: normal S1 & S2 heard. No JVD, murmurs, rubs, gallops or clicks. No pedal edema. Gastrointestinal  system: Abdomen is nondistended, soft and nontender. No organomegaly or masses felt. Normal bowel sounds heard. Central nervous system: Alert and oriented x1 nonverbal today. Resting and intentional tremors Extremities: severe strength deficits. Skin: No rashes, lesions or ulcers. Psychiatry: Judgement and insight appear diminished. Mood & affect flat.   Data Reviewed: I have personally reviewed following labs and imaging studies  CBC: Recent Labs  Lab 09/15/23 0850 09/16/23 0423  WBC  7.6 4.0  HGB 12.2* 11.5*  HCT 35.7* 34.6*  MCV 94.4 95.1  PLT 263 241    Basic Metabolic Panel: Recent Labs  Lab 09/15/23 0850 09/15/23 1000 09/15/23 1912 09/16/23 0423  NA 140  --  140 138  K 2.6*  --  3.7 3.1*  CL 103  --  104 104  CO2 28  --  27 26  GLUCOSE 95  --  95 127*  BUN 18  --  14 16  CREATININE 0.83  --  0.79 0.77  CALCIUM  8.3*  --  8.0* 8.1*  MG  --  2.0  --  2.0    CBG: Recent Labs  Lab 09/15/23 0852  GLUCAP 101*    Recent Results (from the past 240 hours)  Resp panel by RT-PCR (RSV, Flu A&B, Covid) Anterior Nasal Swab     Status: None   Collection Time: 09/15/23  9:20 AM   Specimen: Anterior Nasal Swab  Result Value Ref Range Status   SARS Coronavirus 2 by RT PCR NEGATIVE NEGATIVE Final    Comment: (NOTE) SARS-CoV-2 target nucleic acids are NOT DETECTED.  The SARS-CoV-2 RNA is generally detectable in upper respiratory specimens during the acute phase of infection. The lowest concentration of SARS-CoV-2 viral copies this assay can detect is 138 copies/mL. A negative result does not preclude SARS-Cov-2 infection and should not be used as the sole basis for treatment or other patient management decisions. A negative result may occur with  improper specimen collection/handling, submission of specimen other than nasopharyngeal swab, presence of viral mutation(s) within the areas targeted by this assay, and inadequate number of viral copies(<138 copies/mL). A negative result must be combined with clinical observations, patient history, and epidemiological information. The expected result is Negative.  Fact Sheet for Patients:  BloggerCourse.com  Fact Sheet for Healthcare Providers:  SeriousBroker.it  This test is no t yet approved or cleared by the [place]  FDA and  has been authorized for detection and/or diagnosis of SARS-CoV-2 by FDA under an Emergency Use Authorization (EUA). This EUA will  remain  in effect (meaning this test can be used) for the duration of the COVID-19 declaration under Section 564(b)(1) of the Act, 21 U.S.C.section 360bbb-3(b)(1), unless the authorization is terminated  or revoked sooner.       Influenza A by PCR NEGATIVE NEGATIVE Final   Influenza B by PCR NEGATIVE NEGATIVE Final    Comment: (NOTE) The Xpert Xpress SARS-CoV-2/FLU/RSV plus assay is intended as an aid in the diagnosis of influenza from Nasopharyngeal swab specimens and should not be used as a sole basis for treatment. Nasal washings and aspirates are unacceptable for Xpert Xpress SARS-CoV-2/FLU/RSV testing.  Fact Sheet for Patients: BloggerCourse.com  Fact Sheet for Healthcare Providers: SeriousBroker.it  This test is not yet approved or cleared by the [place]  FDA and has been authorized for detection and/or diagnosis of SARS-CoV-2 by FDA under an Emergency Use Authorization (EUA). This EUA will remain in effect (meaning this test can be used) for the duration of the COVID-19 declaration under  Section 564(b)(1) of the Act, 21 U.S.C. section 360bbb-3(b)(1), unless the authorization is terminated or revoked.     Resp Syncytial Virus by PCR NEGATIVE NEGATIVE Final    Comment: (NOTE) Fact Sheet for Patients: BloggerCourse.com  Fact Sheet for Healthcare Providers: SeriousBroker.it  This test is not yet approved or cleared by the [place]  FDA and has been authorized for detection and/or diagnosis of SARS-CoV-2 by FDA under an Emergency Use Authorization (EUA). This EUA will remain in effect (meaning this test can be used) for the duration of the COVID-19 declaration under Section 564(b)(1) of the Act, 21 U.S.C. section 360bbb-3(b)(1), unless the authorization is terminated or revoked.  Performed at [facility], [address], [place], [place] [address]   Culture,  blood (routine x 2)     Status: None (Preliminary result)   Collection Time: 09/15/23  2:01 PM   Specimen: BLOOD  Result Value Ref Range Status   Specimen Description BLOOD BLOOD RIGHT ARM  Final   Special Requests   Final    BOTTLES DRAWN AEROBIC AND ANAEROBIC Blood Culture adequate volume   Culture   Final    NO GROWTH < 24 HOURS Performed at [facility], [address] [place], [place] [address]    Report Status PENDING  Incomplete  Culture, blood (routine x 2)     Status: None (Preliminary result)   Collection Time: 09/15/23  2:06 PM   Specimen: BLOOD  Result Value Ref Range Status   Specimen Description BLOOD BLOOD LEFT HAND  Final   Special Requests   Final    BOTTLES DRAWN AEROBIC ONLY Blood Culture adequate volume   Culture   Final    NO GROWTH < 24 HOURS Performed at [facility], [address], [place], [place] [address]    Report Status PENDING  Incomplete     Radiology Studies: CT HEAD W CONTRAST ( ) Addendum Date: 09/15/2023 ADDENDUM #1  ADDENDUM: Correction to impression. This examination is a post contrast exam. There are foci of nodular/irregular enhancement seen in the region of edema in the left frontal lobe corresponding to areas of enhancement on MRI. Findings most concerning for CNS neoplasm. No evidence of hemorrhage. Findings discussed with Dr Cleotilde at 6:30PM on 09/15/23 at the time of MRI interpretation. ---------------------------------------------------- Electronically signed by: Donnice Mania MD 09/15/2023 06:33 PM EDT RP Workstation: [id number]   Result Date: 09/15/2023  ORIGINAL REPORT * EXAM: CT HEAD WITH 09/15/2023 03:02:19 PM TECHNIQUE: CT of the head was performed with the administration of 75 mL of intravenous iohexol  (OMNIPAQUE ) 300 mg/mL solution. Automated exposure control, iterative reconstruction, and/or weight based adjustment of the mA/kV was utilized to reduce the radiation dose to as low as reasonably achievable. COMPARISON: Earlier same day.  CLINICAL HISTORY: Mental status change, unknown cause. Per [facility] pt woke up w/ confusion this AM. Pt LKW 2130 last night. Pt w/ hx of parkinsons. Pt c/o of burning w/ urination and pain to left arm from covid vaccine yesterday. FINDINGS: BRAIN AND VENTRICLES: Bilateral deep brain stimulation leads terminating in the cerebellar peduncles bilaterally. Vasogenic edema in the left frontal lobe extending into the corona radiata and extending into the posterior superior left temporal lobe. Associated hyperattenuating areas concerning for interval development of hemorrhage. Similar appearance of edema and mass effect without significant midline shift. Within the limitations of artifact there is no evidence of extraaxial fluid. There is no evidence of hemorrhage or edema adjacent to the deep brain stimulation leads. The basilar cisterns are patent.  ORBITS: Bilateral lens replacement. SINUSES AND MASTOIDS: Mild mucosal thickening in the ethmoid sinuses. SOFT TISSUES AND SKULL: The calvarium is intact. Similar appearance of extracranial portion of the leads. IMPRESSION: 1. Similar vasogenic edema primarily in the left frontal lobe with interval development of intraparenchymal hemorrhage. 2. No change in mass effect. No midline shift. 3. No evidence of hemorrhage or edema adjacent to the deep brain stimulation leads, within the limitations of artifact. 4. Findings discussed with Dr. Cleotilde at 4:37PM on 09/15/23. Electronically signed by: Donnice Mania MD 09/15/2023 04:49 PM EDT RP Workstation: [id number]   MR Brain W and Wo Contrast Addendum Date: 09/15/2023  ADDENDUM #1  ADDENDUM: Finding discussed with Dr. Cleotilde at 6:30PM on 09/15/23. ---------------------------------------------------- Electronically signed by: Donnice Mania MD 09/15/2023 06:31 PM EDT RP Workstation: [id number]   Result Date: 09/15/2023  ORIGINAL REPORTEXAM: MRI BRAIN WITH AND WITHOUT CONTRAST 09/15/2023 04:47:27 PM TECHNIQUE: Multiplanar multisequence  MRI of the head/brain was performed with and without the administration of intravenous contrast. COMPARISON: Earlier same day CT. CLINICAL HISTORY: Mental status change, unknown cause. AMS, pt has 2 conditional deep brain stimulators. FINDINGS: BRAIN AND VENTRICLES: There is a large ill-defined region of T2/FLAIR hyperintensity primarily within the left frontal lobe with extension into the high left parietal lobe near the vertex as well as extending into the left corona radiata, posterior superior left temporal lobe and partially extending into the left basal ganglia. Within this region there are irregular nodular areas of enhancement primarily along the periphery of T2 signal abnormality. There is an additional region of mild T2/FLAIR signal abnormality within the centrum semiovale with few additional areas of irregular enhancement. There is T2 shine through throughout the region of signal abnormality. One small focus of restricted diffusion within the lesion likely corresponding to a focus of enhancement. There is no definite infarct. Mild parenchymal volume loss. There is mild local mass effect within the left frontoparietal lobes with sulcal effacement and partial effacement of the left lateral ventricle. No midline shift. The basilar cisterns are patent. ORBITS: Bilateral lens replacement. SINUSES: Mild mucosal thickening in the ethmoid sinuses. BONES AND SOFT TISSUES: Bilateral deep brain stimulation leads noted. No significant signal abnormality adjacent to the deep brain stimulation leads. IMPRESSION: 1. Large ill-defined region of T2/FLAIR hyperintensity involving the left frontal lobe, high left parietal lobe, left corona radiata, posterior superior left temporal lobe, and partially the left basal ganglia, with irregular peripheral enhancement and mild local mass effect. No midline shift. 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 2. Additional mild T2/FLAIR signal abnormality within the right centrum  semiovale with irregular enhancement. 3. No evidence of infarct or hemorrhage. Electronically signed by: Donnice Mania MD 09/15/2023 06:26 PM EDT RP Workstation: [id number]   CT Head Wo Contrast Result Date: 09/15/2023 CLINICAL DATA:  Mental status change, unknown cause. EXAM: CT HEAD WITHOUT CONTRAST TECHNIQUE: Contiguous axial images were obtained from the base of the skull through the vertex without intravenous contrast. RADIATION DOSE REDUCTION: This exam was performed according to the departmental dose-optimization program which includes automated exposure control, adjustment of the mA and/or kV according to patient size and/or use of iterative reconstruction technique. COMPARISON:  CT head from 07/05/2023. FINDINGS: Brain: There is new vasogenic cerebral edema involving the left superior frontal lobe. This finding is new since the prior study. Differential diagnosis includes neoplastic process, infection, inflammation, etc. Further evaluation with contrast-enhanced MRI is recommended. No evidence of intraparenchymal hemorrhage, hydrocephalus, extra-axial collection or midline shift. There is bilateral periventricular hypodensity, which  is non-specific but most likely seen in the settings of microvascular ischemic changes. Mild in extent. Otherwise normal appearance of brain parenchyma. Ventricles are normal. Cerebral volume is age appropriate. Vascular: No hyperdense vessel or unexpected calcification. Skull: Normal. Negative for fracture or focal lesion. Sinuses/Orbits: No acute finding. Mild mucoperiosteal thickening noted in the bilateral ethmoidal air cells. Other: Stable appearance of frontal approach bilateral deep brain stimulator leads. Visualized mastoid air cells are unremarkable. No mastoid effusion. IMPRESSION: 1. New vasogenic cerebral edema involving the left superior frontal lobe. Differential diagnosis includes neoplastic process, infection, inflammation, etc. Further evaluation with  contrast-enhanced MRI is recommended. 2. No acute intracranial hemorrhage. No midline shift. Electronically Signed   By: Ree Molt M.D.   On: 09/15/2023 10:02   DG Chest Port 1 View Result Date: 09/15/2023 CLINICAL DATA:  Weakness. EXAM: PORTABLE CHEST 1 VIEW COMPARISON:  05/16/2023. FINDINGS: Bilateral lung fields are clear. Bilateral costophrenic angles are clear. Normal cardio-mediastinal silhouette. No acute osseous abnormalities. The soft tissues are within normal limits. Redemonstration of 2 battery packs overlying the upper chest with the leads extending superiorly towards the neck. IMPRESSION: *No active disease. Electronically Signed   By: Ree Molt M.D.   On: 09/15/2023 09:56    Scheduled Meds:  carbidopa -levodopa   2 tablet Oral QID   dexamethasone  (DECADRON ) injection  4 mg Intravenous Q12H   enoxaparin  (LOVENOX ) injection  40 mg Subcutaneous Q24H   fludrocortisone   0.2 mg Oral TID WC   midodrine   5 mg Oral TID WC   sodium chloride  flush  3 mL Intravenous Q12H   Continuous Infusions:   LOS: 0 days   Time spent: 55 mins  Hildy Nicholl Vicci, MD How to contact the [facility] Attending or Consulting provider 7A - 7P or covering provider during after hours 7P -7A, for this patient?  Check the care team in [facility] and look for a) attending/consulting TR[facility] provider listed and b) the TRH team listed Log into [email or web address] to find provider on call.  Locate the TRH provider you are looking for under [facility] and page to a number that you can be directly reached. If you still have difficulty reaching the provider, please page the [facility] (Director on Call) for the Hospitalists listed on amion for assistance.  09/16/2023, 1:16 PM

## 2023-09-16 NOTE — Plan of Care (Signed)

## 2023-09-16 NOTE — Plan of Care (Signed)
  Problem: Clinical Measurements: Goal: Ability to maintain clinical measurements within normal limits will improve Outcome: Progressing Goal: Will remain free from infection Outcome: Progressing Goal: Cardiovascular complication will be avoided Outcome: Progressing   Problem: Activity: Goal: Risk for activity intolerance will decrease Outcome: Progressing

## 2023-09-16 NOTE — NC FL2 (Signed)
 [place]  MEDICAID FL2 LEVEL OF CARE FORM     IDENTIFICATION  Patient Name: [name] [name] Birthdate: [birth year] Sex: male Admission Date (Current Location): 09/15/2023  [facility] and [place] Number:  [name]:  [name],  [address], [place] [address]      Provider Number: [id number]  Attending Physician Name and Address:  Vicci Afton CROME, MD  Relative Name and Phone Number:       Current Level of Care: Hospital Recommended Level of Care: Skilled Nursing Facility Prior Approval Number:    Date Approved/Denied:   PASRR Number: [id number]  Discharge Plan: SNF    Current Diagnoses: Patient Active Problem List   Diagnosis Date Noted   Brain mass 09/15/2023   Hypokalemia 09/15/2023   Fever 09/15/2023   Hypophonia 07/11/2023   Anemia 07/06/2023   Acute metabolic encephalopathy 07/06/2023   Nephrolithiasis 08/13/2022   Bladder stones 09/13/2020   Gross hematuria 09/12/2020   Orthostatic hypotension 10/27/2019   Acute pyelonephritis 12/16/2012   Sepsis (HCC) 12/15/2012   Parkinson's disease (HCC) 12/15/2012    Orientation RESPIRATION BLADDER Height & Weight     Self, Place  Normal Continent Weight: 156 lb 8.4 oz (71 kg) Height:  5' 10 (177.8 cm)  BEHAVIORAL SYMPTOMS/MOOD NEUROLOGICAL BOWEL NUTRITION STATUS      Continent Diet (Regular)  AMBULATORY STATUS COMMUNICATION OF NEEDS Skin   Extensive Assist Verbally Normal                       Personal Care Assistance Level of Assistance  Bathing, Feeding, Dressing Bathing Assistance: Limited assistance Feeding assistance: Independent Dressing Assistance: Limited assistance     Functional Limitations Info  Sight, Hearing, Speech Sight Info: Impaired Hearing Info: Adequate Speech Info: Adequate    SPECIAL CARE FACTORS FREQUENCY  PT (By licensed PT), OT (By licensed OT)     PT Frequency: 5 times weekly OT Frequency: 5 times weekly             Contractures Contractures Info: Not present    Additional Factors Info  Code Status, Allergies Code Status Info: FULL Allergies Info: Rivastigmine Tartrate, Tamsulosin, Alfuzosin            Current Medications (09/16/2023):  This is the current hospital active medication list Current Facility-Administered Medications  Medication Dose Route Frequency Provider Last Rate Last Admin   acetaminophen  (TYLENOL ) tablet 650 mg  650 mg Oral Q6H PRN Opyd, Timothy S, MD       Or   acetaminophen  (TYLENOL ) suppository 650 mg  650 mg Rectal Q6H PRN Opyd, Timothy S, MD       carbidopa -levodopa  (SINEMET  IR) 25-100 MG per tablet immediate release 2 tablet  2 tablet Oral QID Opyd, Timothy S, MD   2 tablet at 09/16/23 9066   dexamethasone  (DECADRON ) injection 4 mg  4 mg Intravenous Q12H Opyd, Timothy S, MD   4 mg at 09/16/23 0827   enoxaparin  (LOVENOX ) injection 40 mg  40 mg Subcutaneous Q24H Opyd, Timothy S, MD       fludrocortisone  (FLORINEF ) tablet 0.2 mg  0.2 mg Oral TID WC Opyd, Timothy S, MD   0.2 mg at 09/16/23 0825   midodrine  (PROAMATINE ) tablet 5 mg  5 mg Oral TID WC Opyd, Timothy S, MD   5 mg at 09/16/23 0825   prochlorperazine  (COMPAZINE ) injection 5 mg  5 mg Intravenous Q6H PRN Opyd, Evalene RAMAN, MD       senna-docusate (  Senokot-S) tablet 1 tablet  1 tablet Oral QHS PRN Opyd, Timothy S, MD       sodium chloride  flush (NS) 0.9 % injection 3 mL  3 mL Intravenous Q12H Opyd, Timothy S, MD   3 mL at 09/16/23 0845     Discharge Medications: Please see discharge summary for a list of discharge medications.  Relevant Imaging Results:  Relevant Lab Results:   Additional Information SSN: [id number], [place]

## 2023-09-16 NOTE — Evaluation (Signed)
 Occupational Therapy Evaluation Patient Details Name: [name] [name] MRN: [id number] DOB: [birth year] Today's Date: 09/16/2023   History of Present Illness   [name] [name] is a 78 y.o. male with medical history significant for Parkinson's disease with deep brain stimulator, orthostatic hypotension, and BPH who presents with confusion and change in speech.     Patient was noted by family to be confused since yesterday morning.  He has had similar confusion in the setting of UTI previously and this is what they were suspecting.  Today, he remained confused and was noted to have slurred speech.  This concerned the family for possible stroke and he was sent to the ED.  Patient states that he is hungry but has no other acute complaints.     ED Course: Upon arrival to the ED, patient is found to be febrile 38.2 C with normal RR, normal HR, and elevated BP.  Labs are most notable for potassium 2.6, normal creatinine, normal WBC, negative respiratory virus panel, and undetectable ammonia.  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     Neurosurgery (Dr. Darnella) was consulted by the ED physician and recommended starting Decadron  twice daily and having the patient follow-up in the clinic early next week.  Blood and urine cultures were collected in the ED and the patient was given a liter of saline, oral and IV potassium, acetaminophen , Rocephin , and Decadron . (per MD)     Clinical Impressions Pt agreeable to OT and PT co-evaluation. Pt lives with wife who is present 24/7 but recently lost the assist of a care provider. Pt is assisted for ADL's and ambulation at baseline, but can walk without assist some using the parkinson's rollator. Pt required CGA for bed mobility and mod to max A for step pivot and ambulatory transfers with the RW. Pt noted to accidentally have a bowel movement on the floor while ambulating to the bathroom. Assist needed for peri-care. Pt is able to doff  and don socks without physical assist. Tremors and coordination deficits noted bilaterally in UE. Mild increase in weakness to R side shoulder flexion compared to R side. Pt left in the chair with call bell within reach and family present. Pt will benefit from continued OT in the hospital and recommended venue below to increase strength, balance, and endurance for safe ADL's.        If plan is discharge home, recommend the following:   A lot of help with walking and/or transfers;A lot of help with bathing/dressing/bathroom;Assistance with cooking/housework;Assistance with feeding;Direct supervision/assist for medications management;Assist for transportation;Help with stairs or ramp for entrance     Functional Status Assessment   Patient has had a recent decline in their functional status and demonstrates the ability to make significant improvements in function in a reasonable and predictable amount of time.     Equipment Recommendations   None recommended by OT             Precautions/Restrictions   Precautions Precautions: Fall Recall of Precautions/Restrictions: Impaired Restrictions Weight Bearing Restrictions Per Provider Order: No     Mobility Bed Mobility Overal bed mobility: Needs Assistance Bed Mobility: Supine to Sit     Supine to sit: Contact guard, HOB elevated     General bed mobility comments: labored movement; no physical assist.    Transfers Overall transfer level: Needs assistance Equipment used: Rolling walker (2 wheels) Transfers: Sit to/from Stand, Bed to chair/wheelchair/BSC Sit to Stand: Mod assist  Step pivot transfers: Mod assist     General transfer comment: EOB to chair with RW      Balance Overall balance assessment: Needs assistance Sitting-balance support: No upper extremity supported, Feet supported Sitting balance-Leahy Scale: Fair Sitting balance - Comments: fair to good seated at EOB   Standing balance support:  Bilateral upper extremity supported, During functional activity, Reliant on assistive device for balance Standing balance-Leahy Scale: Poor Standing balance comment: using RW                           ADL either performed or assessed with clinical judgement   ADL Overall ADL's : Needs assistance/impaired Eating/Feeding: Set up;Sitting   Grooming: Minimal assistance;Sitting   Upper Body Bathing: Minimal assistance;Moderate assistance;Sitting   Lower Body Bathing: Minimal assistance;Sitting/lateral leans   Upper Body Dressing : Minimal assistance;Sitting   Lower Body Dressing: Minimal assistance;Sitting/lateral leans;Contact guard assist Lower Body Dressing Details (indicate cue type and reason): Pt able to doff and don both socks with CGA. Toilet Transfer: Moderate assistance;Rolling walker (2 wheels);Ambulation;Maximal assistance Toilet Transfer Details (indicate cue type and reason): chair to toilet with RW Toileting- Clothing Manipulation and Hygiene: Moderate assistance;Maximal assistance;Sit to/from stand;Sitting/lateral lean Toileting - Clothing Manipulation Details (indicate cue type and reason): Pt able to complete some peri-care at the toilet with lateral leans ans sit to stand, but additional assist needed to ensure pt was clean.     Functional mobility during ADLs: Moderate assistance;Maximal assistance;Rolling walker (2 wheels) General ADL Comments: Able to ambulate to the toilet and back to the chair with fluctuating level of assist. Mod to max going to the toilet. More mod A for return to chair with RW.     Vision Baseline Vision/History: 4 Cataracts (Decreased near vision) Ability to See in Adequate Light: 1 Impaired Patient Visual Report: No change from baseline;Other (comment) (decreased clarity reported by pt) Vision Assessment?: No apparent visual deficits (other than reports of acuity decrease recently)     Perception Perception: Not tested        Praxis Praxis: Impaired Praxis Impairment Details: Motor planning, Organization Praxis-Other Comments: Baseline parkinson's tremors.   Pertinent Vitals/Pain Pain Assessment Pain Assessment: Faces Faces Pain Scale: Hurts a little bit Pain Location: L arm Pain Descriptors / Indicators: Discomfort Pain Intervention(s): Monitored during session     Extremity/Trunk Assessment Upper Extremity Assessment Upper Extremity Assessment: Generalized weakness;RUE deficits/detail (Baseline parkinson's; limits fine and gross motor coordination.) RUE Deficits / Details: mild increased R UE shoulder weakness compared to L. 4-/5 shoulder flexion; 4/5 shoulder abduction; 4+/5 otherwise. RUE Coordination: decreased fine motor;decreased gross motor (baseline parkinson's disease.)   Lower Extremity Assessment Lower Extremity Assessment: Defer to PT evaluation   Cervical / Trunk Assessment Cervical / Trunk Assessment: Kyphotic   Communication Communication Communication: Impaired Factors Affecting Communication: Reduced clarity of speech;Other (comment) (soft spoken; stuttering at times.)   Cognition Arousal: Alert Behavior During Therapy: WFL for tasks assessed/performed Cognition: Cognition impaired (Wife reports the pt's cognition is not fully back to normal.)             OT - Cognition Comments: Pt able to follow commands with cuing and some extended time for the most part.                 Following commands: Intact (with time and cuing)       Cueing  General Comments   Cueing Techniques: Verbal cues;Tactile cues  History of orthostatic hypotension  per wife's report. Seated BP noted to be 133/99; after standing BP was taken at 104/78 with some reports of being light headed.              Home Living Family/patient expects to be discharged to:: Private residence Living Arrangements: Spouse/significant other;Other relatives Available Help at Discharge: Family;Available  PRN/intermittently Type of Home: House Home Access: Stairs to enter;Other (comment) (chair lift) Entrance Stairs-Number of Steps: 3   Home Layout: Two level;Able to live on main level with bedroom/bathroom     Bathroom Shower/Tub: Producer, television/film/video: Standard     Home Equipment: Shower seat - built in;Grab bars - tub/shower;Wheelchair - Careers adviser (comment) (Parkinson's rollator)   Additional Comments: Wife present 24/7 but uses a RW herself. [place] has been helping as well. No other change reported in living history from prior admission.      Prior Functioning/Environment Prior Level of Function : Needs assist       Physical Assist : Mobility (physical);ADLs (physical) Mobility (physical): Transfers;Gait;Stairs ADLs (physical): Grooming;Bathing;Dressing;Toileting;IADLs;Feeding Mobility Comments: [place] short distance ambulation at times with parkinson's rollator. Uses w/c as well depending on how he is feeling. Assistance needed at times. ADLs Comments: Assisted for all ADL's by family.    OT Problem List: Decreased strength;Decreased range of motion;Decreased activity tolerance;Impaired balance (sitting and/or standing);Decreased cognition;Decreased coordination   OT Treatment/Interventions: Self-care/ADL training;Therapeutic exercise;Neuromuscular education;DME and/or AE instruction;Energy conservation;Therapeutic activities;Patient/family education;Balance training;Cognitive remediation/compensation      OT Goals(Current goals can be found in the care plan section)   Acute Rehab OT Goals Patient Stated Goal: improve function OT Goal Formulation: With patient/family Time For Goal Achievement: 09/30/23 Potential to Achieve Goals: Fair   OT Frequency:  Min 3X/week    Co-evaluation PT/OT/SLP Co-Evaluation/Treatment: Yes Reason for Co-Treatment: To address functional/ADL transfers   OT goals addressed during session: ADL's and self-care       AM-PAC OT 6 Clicks Daily Activity     Outcome Measure Help from another person eating meals?: A Little Help from another person taking care of personal grooming?: A Little Help from another person toileting, which includes using toliet, bedpan, or urinal?: A Lot Help from another person bathing (including washing, rinsing, drying)?: A Lot Help from another person to put on and taking off regular upper body clothing?: A Little Help from another person to put on and taking off regular lower body clothing?: A Little 6 Click Score: 16   End of Session Equipment Utilized During Treatment: Rolling walker (2 wheels);Gait belt  Activity Tolerance: Patient tolerated treatment well Patient left: in chair;with call bell/phone within reach;with family/visitor present  OT Visit Diagnosis: Unsteadiness on feet (R26.81);Other abnormalities of gait and mobility (R26.89);Muscle weakness (generalized) (M62.81);History of falling (Z91.81);Other symptoms and signs involving cognitive function;Cognitive communication deficit (R41.841)                Time: 9163-9083 OT Time Calculation (min): 40 min Charges:  OT General Charges $OT Visit: 1 Visit OT Evaluation $OT Eval Moderate Complexity: 1 Mod  Eretria Manternach OT, MOT  Jayson Person 09/16/2023, 11:30 AM

## 2023-09-17 DIAGNOSIS — I951 Orthostatic hypotension: Secondary | ICD-10-CM | POA: Diagnosis not present

## 2023-09-17 DIAGNOSIS — R4182 Altered mental status, unspecified: Secondary | ICD-10-CM | POA: Diagnosis not present

## 2023-09-17 DIAGNOSIS — G9389 Other specified disorders of brain: Secondary | ICD-10-CM | POA: Diagnosis not present

## 2023-09-17 DIAGNOSIS — G20A1 Parkinson's disease without dyskinesia, without mention of fluctuations: Secondary | ICD-10-CM | POA: Diagnosis not present

## 2023-09-17 LAB — BASIC METABOLIC PANEL WITH GFR
Anion gap: 6 (ref 5–15)
BUN: 22 mg/dL (ref 8–23)
CO2: 25 mmol/L (ref 22–32)
Calcium: 8.8 mg/dL — ABNORMAL LOW (ref 8.9–10.3)
Chloride: 106 mmol/L (ref 98–111)
Creatinine, Ser: 0.89 mg/dL (ref 0.61–1.24)
GFR, Estimated: >60 mL/min (ref >60)
Glucose, Bld: 154 mg/dL — ABNORMAL HIGH (ref 70–99)
Potassium: 3.5 mmol/L (ref 3.5–5.1)
Sodium: 137 mmol/L (ref 135–145)

## 2023-09-17 LAB — URINE CULTURE

## 2023-09-17 LAB — GLUCOSE, CAPILLARY
Glucose-Capillary: 129 mg/dL — ABNORMAL HIGH (ref 70–99)
Glucose-Capillary: 159 mg/dL — ABNORMAL HIGH (ref 70–99)

## 2023-09-17 LAB — HEMOGLOBIN A1C
Hgb A1c MFr Bld: 5 % (ref 4.8–5.6)
Mean Plasma Glucose: 96.8 mg/dL

## 2023-09-17 MED ORDER — DEXAMETHASONE 4 MG PO TABS
4.0000 mg | ORAL_TABLET | Freq: Two times a day (BID) | ORAL | Status: DC
  Administered 2023-09-18 – 2023-09-20 (×5): 4 mg via ORAL
  Filled 2023-09-17 (×5): qty 1

## 2023-09-17 MED ORDER — LACTATED RINGERS IV SOLN: INTRAVENOUS | Status: DC

## 2023-09-17 MED ORDER — INSULIN ASPART 100 UNIT/ML IJ SOLN: 2.0000 [IU] | Freq: Three times a day (TID) | INTRAMUSCULAR | Status: DC

## 2023-09-17 MED ORDER — INSULIN ASPART 100 UNIT/ML IJ SOLN: 0.0000 [IU] | Freq: Three times a day (TID) | INTRAMUSCULAR | Status: DC

## 2023-09-17 NOTE — Progress Notes (Signed)
 Daily Progress Note   Patient Name: [name] [name]       Date: 09/17/2023 DOB: September 19, 1945  Age: 78 y.o. MRN#: [id number] Attending Physician: Vicci Afton CROME, MD Primary Care Physician: Beverley Louann ONEIDA, MD Admit Date: 09/15/2023  Reason for Consultation/Follow-up: Establishing goals of care  Subjective:  78 y.o. male  with past medical history of Parkinson's disease with deep brain stimulator, orthostatic hypotension, and BPH admitted on 09/15/2023 with confusion and changes in speech.    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  Neurosurgery was consulted and recommended initiating Decadron  twice daily and have the patient follow-up as an outpatient.  Patient was admitted due to hypokalemia.  Also undergoing PT/OT/SLP assessment to determine disposition   09/16/2023: Goals of care/advance care planning discussion completed.  Goal is for patient to return home with family and home health services versus short-term rehab stay for therapy.  Shared concerns regarding findings on imaging and the implications on patient's overall health especially given comorbidities.  Patient's wife verbalized understanding.  Verbalizes quality of life and maintaining function are important.  Discussed limitations and potential burdens of CPR and intubation given patient's age and comorbidities.  Patient's wife states that he is always that he wanted to be a full code.  She desires more time to consider and time for outcomes (specifically mentions results of brain biopsy).  Agreeable to ongoing palliative follow-up.   Today, labs reviewed.  Potassiums has normalized.  Renal function stable.  Continues to have mildly low calcium  level although improved at  8.8 compared to 8.1 on 09/16/2023).  Vital signs appear relatively stable.  Patient has had good p.o. intake today.  He has been working with therapy and has been making some progress.  I do not see where he has required any medication for pain or nausea on 24-hour look back.  Patient states he is doing well today and denies any concerns.  His speech is soft and he defers to wife to provide history and participate in discussion.  Independent history obtained from patient's wife, followed up on yesterday's conversation, she states that she has thought about our discussion yesterday and considered updating advanced directives.  However, she again reiterates that patient has always indicated he wanted to be a full code.  Offered to speak with patient again today about findings on imaging, goals of care, and education regarding limitations/potential burdens of CPR and intubation.  Patient's wife declines.  She states that they remain hopeful that brain biopsy will reveal a  benign growth versus malignancy.  I did pull up and review imaging with patient's wife.  She indicates she was not aware how large the area of involvement was.  However, she remains hopeful.  Reiterates they have a good quality of life at this time and states that she wants to know more before taking any significant changes.  Provided education regarding outpatient palliative care services, including potential benefits and eligibility criteria. Patient's wife expressed agreement with pursuing a referral to outpatient palliative care to support ongoing care planning.  Chart review/care coordination:  Completed extensive chart review including EPIC notes, labs (independently reviewed), vital signs, medication administration record. Coordinated care with patient, bedside nursing staff, transition of care team, and attending physician.   Length of Stay: 1   Physical Exam Constitutional:      General: He is not in acute distress.     Appearance: He is not toxic-appearing.  Pulmonary:     Effort: Pulmonary effort is normal. No respiratory distress.  Skin:    General: Skin is warm and dry.  Neurological:     Mental Status: He is alert.     Comments: Pleasant, cooperative, very soft-spoken             Vital Signs: BP (!) 149/75 (BP Location: Right Arm)   Pulse 68   Temp 98.2 F (36.8 C) (Oral)   Resp 18   Ht 5' 10 (1.778 m)   Wt 71 kg   SpO2 95%   BMI 22.46 kg/m  SpO2: SpO2: 95 % O2 Device: O2 Device: Room Air O2 Flow Rate:        Palliative Assessment/Data:50-60%   Palliative Care Assessment & Plan   Patient Profile/Assessment:  78 year old male with numerous chronic medical problems including Parkinson's disease status post deep brain stimulator insertion, orthostatic hypotension, and BPH.  Admitted for confusion.  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  Extensive goals of care discussion and advance care planning completed.  Patient and his wife's goal are for him to ultimately return home.  Wife is aware and agreeable to short-term rehabilitation if needed.  We discussed the limitations and potential burdens of CPR and intubation, particularly in the context of advanced age and serious underlying health conditions (including Parkinson's and new brain mass).  Patient's wife remains hopeful that mass could be benign.  She feels he has a good quality of life right now.  She does not wish to make any changes to his advance directives until she receives results from workup of brain mass (outpatient neurosurgery follow-up recommended for further workup and plan to proceed with brain biopsy).  Discussed benefits and eligibility criteria for outpatient palliative referral.  She is interested in having palliative follow them as outpatients.  Will place order.  She plans to follow-up later today to arrange outpatient neurosurgery follow-up for further workup of brain  lesion.  Recommendations/Plan: Continue full code/full scope Continue current symptom regimen as it appears effective Wife requests time for outcomes/results prior to making any changes to advance directives Continue PT/OT/ST evaluation and management to assist with disposition options Outpatient palliative referral at discharge  Symptom management:  Symptom management per primary team with palliative team available to support as needed. Symptoms appear reasonly well-managed at present   Prognosis:  Unable to determine    Discharge Planning: To Be Determined home with home health versus short-term rehab SNF stay    Detailed review of medical records (labs, imaging, vital signs), medically appropriate  exam, discussed with treatment team, counseling and education to patient, family, & staff, documenting clinical information, medication management, coordination of care   Total time: I spent 45 minutes in the care of the patient today in the above activities and documenting the encounter.   Laymon CHRISTELLA Pinal, NP  Palliative Medicine Team Team phone # [phone]  Thank you for allowing the Palliative Medicine Team to assist in the care of this patient. Please utilize secure chat with additional questions, if there is no response within 30 minutes please call the above phone number.  Palliative Medicine Team providers are available by phone from 7am to 7pm daily and can be reached through the team cell phone.  Should this patient require assistance outside of these hours, please call the patient's attending physician.

## 2023-09-17 NOTE — TOC Progression Note (Addendum)
 Transition of Care [facility]) - Progression Note    Patient Details  Name: [name] [name] MRN: [id number] Date of Birth: [birth year]  Transition of Care [facility] CM/SW Contact  Lucie Lunger, [place] Phone Number: 09/17/2023, 12:22 PM  Clinical Narrative:    CSW updated family that [facility] is not able to offer after calling back to see if they could review again. Pts spouse requested that CSW send referral to [facility] as this is the only other facility she is interested in. CSW sent referral and will update when there is a decision. TOC to follow.   CSW spoke with pts wife to review bed offer from [facility], she would like to accept bed offer at this time and is agreeable to using [facility] medicare benefits for SNF stay. Insurance auth to be started. TOC to follow.  Expected Discharge Plan: Skilled Nursing Facility Barriers to Discharge: Continued Medical Work up               Expected Discharge Plan and Services In-house Referral: Clinical Social Work Discharge Planning Services: CM Consult Post Acute Care Choice: Skilled Nursing Facility Living arrangements for the past 2 months: Single Family Home                                       Social Drivers of Health (SDOH) Interventions SDOH Screenings   Food Insecurity: No Food Insecurity (09/15/2023)  Housing: Low Risk  (09/15/2023)  Transportation Needs: No Transportation Needs (09/15/2023)  Utilities: Not At Risk (09/15/2023)  Social Connections: Moderately Isolated (09/15/2023)  Tobacco Use: Low Risk  (09/15/2023)    Readmission Risk Interventions    07/10/2023   12:35 PM  Readmission Risk Prevention Plan  Post Dischage Appt Complete  Medication Screening Complete  Transportation Screening Complete

## 2023-09-17 NOTE — Progress Notes (Signed)
 PROGRESS NOTE   LOC FEINSTEIN  FMW:[id number] DOB: January 25, 1946 DOA: 09/15/2023 PCP: Beverley Louann ONEIDA, MD   Chief Complaint  Patient presents with   Altered Mental Status   Level of care: Med-Surg  Brief Admission History:  78 y.o. male with medical history significant for Parkinson's disease with deep brain stimulator, orthostatic hypotension, and BPH who presents with confusion and change in speech.   Patient was noted by family to be confused since yesterday morning.  He has had similar confusion in the setting of UTI previously and this is what they were suspecting.  Today, he remained confused and was noted to have slurred speech.  This concerned the family for possible stroke and he was sent to the ED.  Patient states that he is hungry but has no other acute complaints.   ED Course: Upon arrival to the ED, patient is found to be febrile 38.2 C with normal RR, normal HR, and elevated BP.  Labs are most notable for potassium 2.6, normal creatinine, normal WBC, negative respiratory virus panel, and undetectable ammonia.  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   Neurosurgery (Dr. Darnella) was consulted by the ED physician and recommended starting Decadron  twice daily and having the patient follow-up in the clinic early next week.  Blood and urine cultures were collected in the ED and the patient was given a liter of saline, oral and IV potassium, acetaminophen , Rocephin , and Decadron .   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  - Neurosurgery recommends starting Decadron  for cerebral edema and close outpatient follow-up  - Continue Decadron , consult PT, OT, and SLP, use delirium precautions   - given his deconditioned state he was recommended for SNF rehab which [facility] says he can go on 09/20/23    Hypokalemia  - repleted  - BMP in AM    Fever  - Transient fever noted; no other SIRS  criteria or apparent infection  - Follow cultures and clinical course off of antibiotics for now    Orthostatic hypotension  - Florinef , midodrine     Parkinson disease  - Sinemet     Generalized Weakness Adult Failure to Thrive  - given high likelihood of primary CNS neoplasm and poor overall health condition and poor overall prognosis, I am requesting a consultation with palliative care for goals of care discussion - PT assessment confirms severe debilitated state and need for skilled nursing care   Steroid induced hyperglycemia - add SSI coverage (sensitive) and CBG monitoring - Goal BS 140-180   DVT prophylaxis: enoxaparin   Code Status: Full  Family Communication: wife updated at bedside Disposition: SNF    Consultants:  Neurosurgery Procedures:   Antimicrobials:    Subjective: Pt is more verbal today, he is eating a little better, he is agreeable to short term rehab   Objective: Vitals:   09/16/23 1413 09/16/23 2047 09/17/23 0514 09/17/23 1310  BP: (!) 143/76 119/60 (!) 149/75 123/72  Pulse: 82 68 68 73  Resp: 20 18 18 17   Temp: 98.1 F (36.7 C) 97.7 F (36.5 C) 98.2 F (36.8 C) 98 F (36.7 C)  TempSrc: Oral Oral Oral Oral  SpO2: 96% 96% 95% 97%  Weight:      Height:        Intake/Output Summary (Last 24 hours) at 09/17/2023 1408 Last data filed at 09/17/2023 1300 Gross per 24 hour  Intake 480 ml  Output 1450  ml  Net -970 ml   Filed Weights   09/15/23 0849  Weight: 71 kg   Examination:  General exam: Appears calm and comfortable severely debilitated state.  Respiratory system: Clear to auscultation. Respiratory effort normal. Cardiovascular system: normal S1 & S2 heard. No JVD, murmurs, rubs, gallops or clicks. No pedal edema. Gastrointestinal system: Abdomen is nondistended, soft and nontender. No organomegaly or masses felt. Normal bowel sounds heard. Central nervous system: Alert and oriented x1 nonverbal today. Resting and intentional tremors of  advanced parkinson's disease  Extremities: severe strength deficits. Skin: No rashes, lesions or ulcers. Psychiatry: Judgement and insight appear diminished. Mood & affect flat.   Data Reviewed: I have personally reviewed following labs and imaging studies  CBC: Recent Labs  Lab 09/15/23 0850 09/16/23 0423  WBC 7.6 4.0  HGB 12.2* 11.5*  HCT 35.7* 34.6*  MCV 94.4 95.1  PLT 263 241    Basic Metabolic Panel: Recent Labs  Lab 09/15/23 0850 09/15/23 1000 09/15/23 1912 09/16/23 0423 09/17/23 0454  NA 140  --  140 138 137  K 2.6*  --  3.7 3.1* 3.5  CL 103  --  104 104 106  CO2 28  --  27 26 25   GLUCOSE 95  --  95 127* 154*  BUN 18  --  14 16 22   CREATININE 0.83  --  0.79 0.77 0.89  CALCIUM  8.3*  --  8.0* 8.1* 8.8*  MG  --  2.0  --  2.0  --     CBG: Recent Labs  Lab 09/15/23 0852  GLUCAP 101*    Recent Results (from the past 240 hours)  Resp panel by RT-PCR (RSV, Flu A&B, Covid) Anterior Nasal Swab     Status: None   Collection Time: 09/15/23  9:20 AM   Specimen: Anterior Nasal Swab  Result Value Ref Range Status   SARS Coronavirus 2 by RT PCR NEGATIVE NEGATIVE Final    Comment: (NOTE) SARS-CoV-2 target nucleic acids are NOT DETECTED.  The SARS-CoV-2 RNA is generally detectable in upper respiratory specimens during the acute phase of infection. The lowest concentration of SARS-CoV-2 viral copies this assay can detect is 138 copies/mL. A negative result does not preclude SARS-Cov-2 infection and should not be used as the sole basis for treatment or other patient management decisions. A negative result may occur with  improper specimen collection/handling, submission of specimen other than nasopharyngeal swab, presence of viral mutation(s) within the areas targeted by this assay, and inadequate number of viral copies(<138 copies/mL). A negative result must be combined with clinical observations, patient history, and epidemiological information. The expected  result is Negative.  Fact Sheet for Patients:  BloggerCourse.com  Fact Sheet for Healthcare Providers:  SeriousBroker.it  This test is no t yet approved or cleared by the [place]  FDA and  has been authorized for detection and/or diagnosis of SARS-CoV-2 by FDA under an Emergency Use Authorization (EUA). This EUA will remain  in effect (meaning this test can be used) for the duration of the COVID-19 declaration under Section 564(b)(1) of the Act, 21 U.S.C.section 360bbb-3(b)(1), unless the authorization is terminated  or revoked sooner.       Influenza A by PCR NEGATIVE NEGATIVE Final   Influenza B by PCR NEGATIVE NEGATIVE Final    Comment: (NOTE) The Xpert Xpress SARS-CoV-2/FLU/RSV plus assay is intended as an aid in the diagnosis of influenza from Nasopharyngeal swab specimens and should not be used as a sole basis for treatment. Nasal  washings and aspirates are unacceptable for Xpert Xpress SARS-CoV-2/FLU/RSV testing.  Fact Sheet for Patients: BloggerCourse.com  Fact Sheet for Healthcare Providers: SeriousBroker.it  This test is not yet approved or cleared by the [place]  FDA and has been authorized for detection and/or diagnosis of SARS-CoV-2 by FDA under an Emergency Use Authorization (EUA). This EUA will remain in effect (meaning this test can be used) for the duration of the COVID-19 declaration under Section 564(b)(1) of the Act, 21 U.S.C. section 360bbb-3(b)(1), unless the authorization is terminated or revoked.     Resp Syncytial Virus by PCR NEGATIVE NEGATIVE Final    Comment: (NOTE) Fact Sheet for Patients: BloggerCourse.com  Fact Sheet for Healthcare Providers: SeriousBroker.it  This test is not yet approved or cleared by the [place]  FDA and has been authorized for detection and/or diagnosis of  SARS-CoV-2 by FDA under an Emergency Use Authorization (EUA). This EUA will remain in effect (meaning this test can be used) for the duration of the COVID-19 declaration under Section 564(b)(1) of the Act, 21 U.S.C. section 360bbb-3(b)(1), unless the authorization is terminated or revoked.  Performed at [facility], [address], [place], [place] [address]   Urine Culture     Status: Abnormal   Collection Time: 09/15/23 10:32 AM   Specimen: Urine, Clean Catch  Result Value Ref Range Status   Specimen Description   Final    URINE, CLEAN CATCH Performed at [facility], [address], [place], [place] [address]    Special Requests   Final    NONE Performed at [facility], [address] [place], [place] [address]    Culture MULTIPLE SPECIES PRESENT, SUGGEST RECOLLECTION (A)  Final   Report Status 09/17/2023 FINAL  Final  Culture, blood (routine x 2)     Status: None (Preliminary result)   Collection Time: 09/15/23  2:01 PM   Specimen: BLOOD  Result Value Ref Range Status   Specimen Description BLOOD BLOOD RIGHT ARM  Final   Special Requests   Final    BOTTLES DRAWN AEROBIC AND ANAEROBIC Blood Culture adequate volume   Culture   Final    NO GROWTH 2 DAYS Performed at [facility], [address], [place], [place] [address]    Report Status PENDING  Incomplete  Culture, blood (routine x 2)     Status: None (Preliminary result)   Collection Time: 09/15/23  2:06 PM   Specimen: BLOOD  Result Value Ref Range Status   Specimen Description BLOOD BLOOD LEFT HAND  Final   Special Requests   Final    BOTTLES DRAWN AEROBIC ONLY Blood Culture adequate volume   Culture   Final    NO GROWTH 2 DAYS Performed at [facility], [address], [place], [place] [address]    Report Status PENDING  Incomplete     Radiology Studies: CT HEAD W CONTRAST ( ) Addendum Date: 09/15/2023 ADDENDUM #1  ADDENDUM: Correction to impression. This examination is a post contrast exam. There are foci of  nodular/irregular enhancement seen in the region of edema in the left frontal lobe corresponding to areas of enhancement on MRI. Findings most concerning for CNS neoplasm. No evidence of hemorrhage. Findings discussed with Dr Cleotilde at 6:30PM on 09/15/23 at the time of MRI interpretation. ---------------------------------------------------- Electronically signed by: Donnice Mania MD 09/15/2023 06:33 PM EDT RP Workstation: [id number]   Result Date: 09/15/2023  ORIGINAL REPORT * EXAM: CT HEAD WITH 09/15/2023 03:02:19 PM TECHNIQUE: CT of the head was performed with the administration  of 75 mL of intravenous iohexol  (OMNIPAQUE ) 300 mg/mL solution. Automated exposure control, iterative reconstruction, and/or weight based adjustment of the mA/kV was utilized to reduce the radiation dose to as low as reasonably achievable. COMPARISON: Earlier same day. CLINICAL HISTORY: Mental status change, unknown cause. Per [facility] pt woke up w/ confusion this AM. Pt LKW 2130 last night. Pt w/ hx of parkinsons. Pt c/o of burning w/ urination and pain to left arm from covid vaccine yesterday. FINDINGS: BRAIN AND VENTRICLES: Bilateral deep brain stimulation leads terminating in the cerebellar peduncles bilaterally. Vasogenic edema in the left frontal lobe extending into the corona radiata and extending into the posterior superior left temporal lobe. Associated hyperattenuating areas concerning for interval development of hemorrhage. Similar appearance of edema and mass effect without significant midline shift. Within the limitations of artifact there is no evidence of extraaxial fluid. There is no evidence of hemorrhage or edema adjacent to the deep brain stimulation leads. The basilar cisterns are patent. ORBITS: Bilateral lens replacement. SINUSES AND MASTOIDS: Mild mucosal thickening in the ethmoid sinuses. SOFT TISSUES AND SKULL: The calvarium is intact. Similar appearance of extracranial portion of the leads. IMPRESSION: 1. Similar  vasogenic edema primarily in the left frontal lobe with interval development of intraparenchymal hemorrhage. 2. No change in mass effect. No midline shift. 3. No evidence of hemorrhage or edema adjacent to the deep brain stimulation leads, within the limitations of artifact. 4. Findings discussed with Dr. Cleotilde at 4:37PM on 09/15/23. Electronically signed by: Donnice Mania MD 09/15/2023 04:49 PM EDT RP Workstation: [id number]   MR Brain W and Wo Contrast Addendum Date: 09/15/2023  ADDENDUM #1  ADDENDUM: Finding discussed with Dr. Cleotilde at 6:30PM on 09/15/23. ---------------------------------------------------- Electronically signed by: Donnice Mania MD 09/15/2023 06:31 PM EDT RP Workstation: [id number]   Result Date: 09/15/2023  ORIGINAL REPORTEXAM: MRI BRAIN WITH AND WITHOUT CONTRAST 09/15/2023 04:47:27 PM TECHNIQUE: Multiplanar multisequence MRI of the head/brain was performed with and without the administration of intravenous contrast. COMPARISON: Earlier same day CT. CLINICAL HISTORY: Mental status change, unknown cause. AMS, pt has 2 conditional deep brain stimulators. FINDINGS: BRAIN AND VENTRICLES: There is a large ill-defined region of T2/FLAIR hyperintensity primarily within the left frontal lobe with extension into the high left parietal lobe near the vertex as well as extending into the left corona radiata, posterior superior left temporal lobe and partially extending into the left basal ganglia. Within this region there are irregular nodular areas of enhancement primarily along the periphery of T2 signal abnormality. There is an additional region of mild T2/FLAIR signal abnormality within the centrum semiovale with few additional areas of irregular enhancement. There is T2 shine through throughout the region of signal abnormality. One small focus of restricted diffusion within the lesion likely corresponding to a focus of enhancement. There is no definite infarct. Mild parenchymal volume loss. There  is mild local mass effect within the left frontoparietal lobes with sulcal effacement and partial effacement of the left lateral ventricle. No midline shift. The basilar cisterns are patent. ORBITS: Bilateral lens replacement. SINUSES: Mild mucosal thickening in the ethmoid sinuses. BONES AND SOFT TISSUES: Bilateral deep brain stimulation leads noted. No significant signal abnormality adjacent to the deep brain stimulation leads. IMPRESSION: 1. Large ill-defined region of T2/FLAIR hyperintensity involving the left frontal lobe, high left parietal lobe, left corona radiata, posterior superior left temporal lobe, and partially the left basal ganglia, with irregular peripheral enhancement and mild local mass effect. No midline shift. 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  2. Additional mild T2/FLAIR signal abnormality within the right centrum semiovale with irregular enhancement. 3. No evidence of infarct or hemorrhage. Electronically signed by: Donnice Mania MD 09/15/2023 06:26 PM EDT RP Workstation: [id number]   CT Head Wo Contrast Result Date: 09/15/2023 CLINICAL DATA:  Mental status change, unknown cause. EXAM: CT HEAD WITHOUT CONTRAST TECHNIQUE: Contiguous axial images were obtained from the base of the skull through the vertex without intravenous contrast. RADIATION DOSE REDUCTION: This exam was performed according to the departmental dose-optimization program which includes automated exposure control, adjustment of the mA and/or kV according to patient size and/or use of iterative reconstruction technique. COMPARISON:  CT head from 07/05/2023. FINDINGS: Brain: There is new vasogenic cerebral edema involving the left superior frontal lobe. This finding is new since the prior study. Differential diagnosis includes neoplastic process, infection, inflammation, etc. Further evaluation with contrast-enhanced MRI is recommended. No evidence of intraparenchymal hemorrhage, hydrocephalus, extra-axial collection  or midline shift. There is bilateral periventricular hypodensity, which is non-specific but most likely seen in the settings of microvascular ischemic changes. Mild in extent. Otherwise normal appearance of brain parenchyma. Ventricles are normal. Cerebral volume is age appropriate. Vascular: No hyperdense vessel or unexpected calcification. Skull: Normal. Negative for fracture or focal lesion. Sinuses/Orbits: No acute finding. Mild mucoperiosteal thickening noted in the bilateral ethmoidal air cells. Other: Stable appearance of frontal approach bilateral deep brain stimulator leads. Visualized mastoid air cells are unremarkable. No mastoid effusion. IMPRESSION: 1. New vasogenic cerebral edema involving the left superior frontal lobe. Differential diagnosis includes neoplastic process, infection, inflammation, etc. Further evaluation with contrast-enhanced MRI is recommended. 2. No acute intracranial hemorrhage. No midline shift. Electronically Signed   By: Ree Molt M.D.   On: 09/15/2023 10:02   DG Chest Port 1 View Result Date: 09/15/2023 CLINICAL DATA:  Weakness. EXAM: PORTABLE CHEST 1 VIEW COMPARISON:  05/16/2023. FINDINGS: Bilateral lung fields are clear. Bilateral costophrenic angles are clear. Normal cardio-mediastinal silhouette. No acute osseous abnormalities. The soft tissues are within normal limits. Redemonstration of 2 battery packs overlying the upper chest with the leads extending superiorly towards the neck. IMPRESSION: *No active disease. Electronically Signed   By: Ree Molt M.D.   On: 09/15/2023 09:56    Scheduled Meds:  carbidopa -levodopa   2 tablet Oral QID   dexamethasone  (DECADRON ) injection  4 mg Intravenous Q12H   [START ON 09/18/2023] dexamethasone   4 mg Oral Q12H   enoxaparin  (LOVENOX ) injection  40 mg Subcutaneous Q24H   fludrocortisone   0.2 mg Oral TID WC   midodrine   5 mg Oral TID WC   sodium chloride  flush  3 mL Intravenous Q12H   Continuous Infusions:  lactated  ringers        LOS: 1 day   Time spent: 55 mins  Cathyann Kilfoyle Vicci, MD How to contact the [facility] Attending or Consulting provider 7A - 7P or covering provider during after hours 7P -7A, for this patient?  Check the care team in [facility] and look for a) attending/consulting [facility] provider listed and b) the [facility] team listed Log into [email or web address] to find provider on call.  Locate the [facility] provider you are looking for under [facility] and page to a number that you can be directly reached. If you still have difficulty reaching the provider, please page the [facility] (Director on Call) for the Hospitalists listed on amion for assistance.  09/17/2023, 2:08 PM

## 2023-09-17 NOTE — Progress Notes (Signed)
 Patients wife has stated she does not want patient receiving PRN insulin  for blood sugars.  Risks and benefits explained verbalizes understanding.

## 2023-09-17 NOTE — Progress Notes (Signed)
 Physical Therapy Treatment Patient Details Name: [name] [name] MRN: [id number] DOB: Nov 03, 1945 Today's Date: 09/17/2023   History of Present Illness [name] [name] is a 78 y.o. male with medical history significant for Parkinson's disease with deep brain stimulator, orthostatic hypotension, and BPH who presents with confusion and change in speech.     Patient was noted by family to be confused since yesterday morning.  He has had similar confusion in the setting of UTI previously and this is what they were suspecting.  Today, he remained confused and was noted to have slurred speech.  This concerned the family for possible stroke and he was sent to the ED.  Patient states that he is hungry but has no other acute complaints.     ED Course: Upon arrival to the ED, patient is found to be febrile 38.2 C with normal RR, normal HR, and elevated BP.  Labs are most notable for potassium 2.6, normal creatinine, normal WBC, negative respiratory virus panel, and undetectable ammonia.  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     Neurosurgery (Dr. Darnella) was consulted by the ED physician and recommended starting Decadron  twice daily and having the patient follow-up in the clinic early next week.  Blood and urine cultures were collected in the ED and the patient was given a liter of saline, oral and IV potassium, acetaminophen , Rocephin , and Decadron .    PT Comments  Patient demonstrates increased endurance/distance for gait training in hallway followed with wheelchair for safety, fair/good return for taking longer steps instructed to step into imprinted blocks in flooring, but continues to have difficulty during transfers and when making turns due to seizing up and stopping due scissoring of leg and resorting to very short steps. Patient tolerated sitting up in chair after therapy with his spouse present. Patient will benefit from continued skilled physical therapy in hospital  and recommended venue below to increase strength, balance, endurance for safe ADLs and gait.    If plan is discharge home, recommend the following: A lot of help with walking and/or transfers;A lot of help with bathing/dressing/bathroom;Help with stairs or ramp for entrance;Assistance with cooking/housework   Can travel by private vehicle     Yes  Equipment Recommendations  None recommended by PT    Recommendations for Other Services       Precautions / Restrictions Precautions Precautions: Fall Recall of Precautions/Restrictions: Impaired Restrictions Weight Bearing Restrictions Per Provider Order: No     Mobility  Bed Mobility Overal bed mobility: Needs Assistance Bed Mobility: Supine to Sit     Supine to sit: Min assist, Contact guard, HOB elevated     General bed mobility comments: increased time, labored movement    Transfers Overall transfer level: Needs assistance Equipment used: Rolling walker (2 wheels) Transfers: Sit to/from Stand, Bed to chair/wheelchair/BSC Sit to Stand: Min assist   Step pivot transfers: Min assist, Mod assist       General transfer comment: increased time mostly due to having diffiuclty taking longer steps, tends to freeze requiring verbal tactile cueing to take longer steps    Ambulation/Gait Ambulation/Gait assistance: Min assist, Mod assist Gait Distance (Feet): 40 Feet Assistive device: Rolling walker (2 wheels) Gait Pattern/deviations: Decreased step length - right, Decreased step length - left, Decreased stride length, Festinating, Narrow base of support, Scissoring Gait velocity: decreased     General Gait Details: increased endurance/distance for gait trainng with improvement for taking steps in hallway after verbal  cueing to take longer steps by placing feet in imprinted blocks on flooring, continues to have most diffiuclty maing turns due to sissioring of legs and reverting to very short steps   Stairs              Wheelchair Mobility     Tilt Bed    Modified Rankin (Stroke Patients Only)       Balance Overall balance assessment: Needs assistance Sitting-balance support: Feet supported, No upper extremity supported Sitting balance-Leahy Scale: Fair Sitting balance - Comments: fair/good seated at EOB   Standing balance support: Reliant on assistive device for balance, During functional activity, Bilateral upper extremity supported Standing balance-Leahy Scale: Fair Standing balance comment: using RW                            Communication Communication Communication: Impaired Factors Affecting Communication: Reduced clarity of speech  Cognition Arousal: Alert Behavior During Therapy: WFL for tasks assessed/performed, Impulsive   PT - Cognitive impairments: No apparent impairments                         Following commands: Intact      Cueing Cueing Techniques: Verbal cues, Tactile cues  Exercises General Exercises - Lower Extremity Long Arc Quad: Seated, AROM, Strengthening, Both, 10 reps Hip Flexion/Marching: Seated, AROM, Strengthening, Both, 10 reps Toe Raises: Seated, AROM, Strengthening, Both, 10 reps Heel Raises: Seated, AROM, Strengthening, Both, 10 reps    General Comments        Pertinent Vitals/Pain Pain Assessment Pain Assessment: No/denies pain    Home Living                          Prior Function            PT Goals (current goals can now be found in the care plan section) Acute Rehab PT Goals Patient Stated Goal: return home with family to assist PT Goal Formulation: With patient/family Time For Goal Achievement: 09/30/23 Potential to Achieve Goals: Good Progress towards PT goals: Progressing toward goals    Frequency    Min 3X/week      PT Plan      Co-evaluation              AM-PAC PT 6 Clicks Mobility   Outcome Measure  Help needed turning from your back to your side while in a flat bed  without using bedrails?: A Little Help needed moving from lying on your back to sitting on the side of a flat bed without using bedrails?: A Little Help needed moving to and from a bed to a chair (including a wheelchair)?: A Lot Help needed standing up from a chair using your arms (e.g., wheelchair or bedside chair)?: A Little Help needed to walk in hospital room?: A Little Help needed climbing 3-5 steps with a railing? : A Lot 6 Click Score: 16    End of Session Equipment Utilized During Treatment: Gait belt Activity Tolerance: Patient tolerated treatment well;Patient limited by fatigue Patient left: in chair;with call bell/phone within reach;with chair alarm set;with family/visitor present Nurse Communication: Mobility status PT Visit Diagnosis: Unsteadiness on feet (R26.81);Other abnormalities of gait and mobility (R26.89);Muscle weakness (generalized) (M62.81)     Time: 9054-8982 PT Time Calculation (min) (ACUTE ONLY): 32 min  Charges:    $Gait Training: 8-22 mins $Therapeutic Exercise: 8-22 mins PT General Charges $$  ACUTE PT VISIT: 1 Visit                     12:10 PM, 09/17/23 Lynwood Music, MPT Physical Therapist with [facility] 336 [phone] office [phone] mobile phone

## 2023-09-17 NOTE — Plan of Care (Signed)

## 2023-09-17 NOTE — Plan of Care (Signed)

## 2023-09-18 DIAGNOSIS — R4182 Altered mental status, unspecified: Secondary | ICD-10-CM | POA: Diagnosis not present

## 2023-09-18 DIAGNOSIS — G9389 Other specified disorders of brain: Secondary | ICD-10-CM | POA: Diagnosis not present

## 2023-09-18 DIAGNOSIS — G20A1 Parkinson's disease without dyskinesia, without mention of fluctuations: Secondary | ICD-10-CM | POA: Diagnosis not present

## 2023-09-18 DIAGNOSIS — I951 Orthostatic hypotension: Secondary | ICD-10-CM | POA: Diagnosis not present

## 2023-09-18 LAB — BASIC METABOLIC PANEL WITH GFR
Anion gap: 6 (ref 5–15)
BUN: 23 mg/dL (ref 8–23)
CO2: 26 mmol/L (ref 22–32)
Calcium: 8.6 mg/dL — ABNORMAL LOW (ref 8.9–10.3)
Chloride: 106 mmol/L (ref 98–111)
Creatinine, Ser: 0.83 mg/dL (ref 0.61–1.24)
GFR, Estimated: >60 mL/min (ref >60)
Glucose, Bld: 127 mg/dL — ABNORMAL HIGH (ref 70–99)
Potassium: 3.5 mmol/L (ref 3.5–5.1)
Sodium: 138 mmol/L (ref 135–145)

## 2023-09-18 LAB — GLUCOSE, CAPILLARY
Glucose-Capillary: 135 mg/dL — ABNORMAL HIGH (ref 70–99)
Glucose-Capillary: 115 mg/dL — ABNORMAL HIGH (ref 70–99)
Glucose-Capillary: 121 mg/dL — ABNORMAL HIGH (ref 70–99)
Glucose-Capillary: 153 mg/dL — ABNORMAL HIGH (ref 70–99)

## 2023-09-18 MED ORDER — HYDRALAZINE HCL 20 MG/ML IJ SOLN
10.0000 mg | INTRAMUSCULAR | Status: DC | PRN
  Administered 2023-09-19 – 2023-09-20 (×2): 10 mg via INTRAVENOUS
  Filled 2023-09-18 (×2): qty 1

## 2023-09-18 NOTE — Plan of Care (Signed)
 Patient resting well with his wife at bedside.

## 2023-09-18 NOTE — Plan of Care (Signed)

## 2023-09-18 NOTE — TOC CM/SW Note (Signed)
 Checked status of auth c/NAVI. It is under review.

## 2023-09-18 NOTE — Progress Notes (Addendum)
 PROGRESS NOTE   [name] [name]  FMW:[id number] DOB: May 06, 1945 DOA: 09/15/2023 PCP: Beverley Louann ONEIDA, MD   Chief Complaint  Patient presents with   Altered Mental Status   Level of care: Med-Surg  Brief Admission History:  78 y.o. male with medical history significant for Parkinson's disease with deep brain stimulator, orthostatic hypotension, and BPH who presents with confusion and change in speech.   Patient was noted by family to be confused since yesterday morning.  He has had similar confusion in the setting of UTI previously and this is what they were suspecting.  Today, he remained confused and was noted to have slurred speech.  This concerned the family for possible stroke and he was sent to the ED.  Patient states that he is hungry but has no other acute complaints.   ED Course: Upon arrival to the ED, patient is found to be febrile 38.2 C with normal RR, normal HR, and elevated BP.  Labs are most notable for potassium 2.6, normal creatinine, normal WBC, negative respiratory virus panel, and undetectable ammonia.  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   Neurosurgery (Dr. Darnella) was consulted by the ED physician and recommended starting Decadron  twice daily and having the patient follow-up in the clinic early next week.  Blood and urine cultures were collected in the ED and the patient was given a liter of saline, oral and IV potassium, acetaminophen , Rocephin , and Decadron .   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  - Neurosurgery recommends starting Decadron  for cerebral edema and close outpatient follow-up  - Continue Decadron , consult PT, OT, and SLP, use delirium precautions   - given his deconditioned state he was recommended for SNF rehab which [facility] says he can go on 09/20/23  - ambulatory referral to neurosurgery made and he has appt on Wed 09/22/23 for consultation    Hypokalemia  - repleted    Fever - resolved  - Transient fever noted; no other SIRS criteria or apparent infection  - Follow cultures and clinical course off of antibiotics for now    Orthostatic hypotension  - Florinef , midodrine  held  Hypertension - elevated BPs likely from steroids - hold midodrine  for now - IV hydralazine  ordered PRN SbP>160    Parkinson disease  - Sinemet     Generalized Weakness Adult Failure to Thrive  - given high likelihood of primary CNS neoplasm and poor overall health condition and poor overall prognosis, I am requesting a consultation with palliative care for goals of care discussion - PT assessment confirms severe debilitated state and need for skilled nursing care  - TOC working on SNF placement ([facility])  Steroid induced hyperglycemia - wife refused SSI coverage (sensitive) and CBG monitoring - Goal BS 140-180   DVT prophylaxis: enoxaparin  (wife declined) added SCDs on 8/16 Code Status: Full  Family Communication: wife updated at bedside Disposition: SNF    Consultants:  Neurosurgery Procedures:   Antimicrobials:    Subjective: Pt reports he is speaking better and appetite is much better on the steroids.   Objective: Vitals:   09/17/23 2049 09/18/23 0403 09/18/23 1211 09/18/23 1317  BP: (!) 157/82 (!) 172/97 (!) 173/86 (!) 154/74  Pulse: 65 63 64 80  Resp: 20 20  17   Temp: 98 F (36.7 C) 97.8 F (36.6 C)  98.7 F (37.1 C)  TempSrc: Oral Oral    SpO2: 99% 98%  96%  Weight:      Height:        Intake/Output Summary (Last 24 hours) at 09/18/2023 1450 Last data filed at 09/18/2023 1300 Gross per 24 hour  Intake 498.86 ml  Output 2130 ml  Net -1631.14 ml   Filed Weights   09/15/23 0849  Weight: 71 kg   Examination:  General exam: Appears calm and comfortable severely debilitated state.  Respiratory system: Clear to auscultation. Respiratory effort normal. Cardiovascular system: normal S1 & S2 heard. No JVD, murmurs,  rubs, gallops or clicks. No pedal edema. Gastrointestinal system: Abdomen is nondistended, soft and nontender. No organomegaly or masses felt. Normal bowel sounds heard. Central nervous system: Alert and oriented x1 nonverbal today. Resting and intentional tremors of advanced parkinson's disease  Extremities: severe strength deficits. Skin: No rashes, lesions or ulcers. Psychiatry: Judgement and insight appear diminished. Mood & affect flat.   Data Reviewed: I have personally reviewed following labs and imaging studies  CBC: Recent Labs  Lab 09/15/23 0850 09/16/23 0423  WBC 7.6 4.0  HGB 12.2* 11.5*  HCT 35.7* 34.6*  MCV 94.4 95.1  PLT 263 241    Basic Metabolic Panel: Recent Labs  Lab 09/15/23 0850 09/15/23 1000 09/15/23 1912 09/16/23 0423 09/17/23 0454 09/18/23 0424  NA 140  --  140 138 137 138  K 2.6*  --  3.7 3.1* 3.5 3.5  CL 103  --  104 104 106 106  CO2 28  --  27 26 25 26   GLUCOSE 95  --  95 127* 154* 127*  BUN 18  --  14 16 22 23   CREATININE 0.83  --  0.79 0.77 0.89 0.83  CALCIUM  8.3*  --  8.0* 8.1* 8.8* 8.6*  MG  --  2.0  --  2.0  --   --     CBG: Recent Labs  Lab 09/17/23 1601 09/17/23 2047 09/18/23 0358 09/18/23 0718 09/18/23 1124  GLUCAP 129* 159* 135* 115* 121*    Recent Results (from the past 240 hours)  Resp panel by RT-PCR (RSV, Flu A&B, Covid) Anterior Nasal Swab     Status: None   Collection Time: 09/15/23  9:20 AM   Specimen: Anterior Nasal Swab  Result Value Ref Range Status   SARS Coronavirus 2 by RT PCR NEGATIVE NEGATIVE Final    Comment: (NOTE) SARS-CoV-2 target nucleic acids are NOT DETECTED.  The SARS-CoV-2 RNA is generally detectable in upper respiratory specimens during the acute phase of infection. The lowest concentration of SARS-CoV-2 viral copies this assay can detect is 138 copies/mL. A negative result does not preclude SARS-Cov-2 infection and should not be used as the sole basis for treatment or other patient  management decisions. A negative result may occur with  improper specimen collection/handling, submission of specimen other than nasopharyngeal swab, presence of viral mutation(s) within the areas targeted by this assay, and inadequate number of viral copies(<138 copies/mL). A negative result must be combined with clinical observations, patient history, and epidemiological information. The expected result is Negative.  Fact Sheet for Patients:  BloggerCourse.com  Fact Sheet for Healthcare Providers:  SeriousBroker.it  This test is no t yet approved or cleared by the [place]  FDA and  has been authorized for detection and/or diagnosis of SARS-CoV-2 by FDA under an Emergency Use Authorization (EUA). This EUA will remain  in effect (meaning this test can be used) for the duration of the COVID-19 declaration under Section 564(b)(1) of the Act, 21 U.S.C.section 360bbb-3(b)(1), unless the authorization is  terminated  or revoked sooner.       Influenza A by PCR NEGATIVE NEGATIVE Final   Influenza B by PCR NEGATIVE NEGATIVE Final    Comment: (NOTE) The Xpert Xpress SARS-CoV-2/FLU/RSV plus assay is intended as an aid in the diagnosis of influenza from Nasopharyngeal swab specimens and should not be used as a sole basis for treatment. Nasal washings and aspirates are unacceptable for Xpert Xpress SARS-CoV-2/FLU/RSV testing.  Fact Sheet for Patients: BloggerCourse.com  Fact Sheet for Healthcare Providers: SeriousBroker.it  This test is not yet approved or cleared by the [place]  FDA and has been authorized for detection and/or diagnosis of SARS-CoV-2 by FDA under an Emergency Use Authorization (EUA). This EUA will remain in effect (meaning this test can be used) for the duration of the COVID-19 declaration under Section 564(b)(1) of the Act, 21 U.S.C. section 360bbb-3(b)(1),  unless the authorization is terminated or revoked.     Resp Syncytial Virus by PCR NEGATIVE NEGATIVE Final    Comment: (NOTE) Fact Sheet for Patients: BloggerCourse.com  Fact Sheet for Healthcare Providers: SeriousBroker.it  This test is not yet approved or cleared by the [place]  FDA and has been authorized for detection and/or diagnosis of SARS-CoV-2 by FDA under an Emergency Use Authorization (EUA). This EUA will remain in effect (meaning this test can be used) for the duration of the COVID-19 declaration under Section 564(b)(1) of the Act, 21 U.S.C. section 360bbb-3(b)(1), unless the authorization is terminated or revoked.  Performed at [facility], [address] [place], [place] [address]   Urine Culture     Status: Abnormal   Collection Time: 09/15/23 10:32 AM   Specimen: Urine, Clean Catch  Result Value Ref Range Status   Specimen Description   Final    URINE, CLEAN CATCH Performed at [facility], [address], [place], [place] [address]    Special Requests   Final    NONE Performed at [facility], [address], [place], [place] [address]    Culture MULTIPLE SPECIES PRESENT, SUGGEST RECOLLECTION (A)  Final   Report Status 09/17/2023 FINAL  Final  Culture, blood (routine x 2)     Status: None (Preliminary result)   Collection Time: 09/15/23  2:01 PM   Specimen: BLOOD  Result Value Ref Range Status   Specimen Description BLOOD BLOOD RIGHT ARM  Final   Special Requests   Final    BOTTLES DRAWN AEROBIC AND ANAEROBIC Blood Culture adequate volume   Culture   Final    NO GROWTH 3 DAYS Performed at [facility], [address] [place], [place] [address]    Report Status PENDING  Incomplete  Culture, blood (routine x 2)     Status: None (Preliminary result)   Collection Time: 09/15/23  2:06 PM   Specimen: BLOOD  Result Value Ref Range Status   Specimen Description BLOOD BLOOD LEFT HAND  Final   Special  Requests   Final    BOTTLES DRAWN AEROBIC ONLY Blood Culture adequate volume   Culture   Final    NO GROWTH 3 DAYS Performed at [facility], [address] [place], [place] [address]    Report Status PENDING  Incomplete     Radiology Studies: CT HEAD W CONTRAST ( ) Addendum Date: 09/15/2023 ADDENDUM #1  ADDENDUM: Correction to impression. This examination is a post contrast exam. There are foci of nodular/irregular enhancement seen in the region of edema in the left frontal lobe corresponding to areas of enhancement on MRI. Findings  most concerning for CNS neoplasm. No evidence of hemorrhage. Findings discussed with Dr Cleotilde at 6:30PM on 09/15/23 at the time of MRI interpretation. ---------------------------------------------------- Electronically signed by: Donnice Mania MD 09/15/2023 06:33 PM EDT RP Workstation: [id number]   Result Date: 09/15/2023  ORIGINAL REPORT * EXAM: CT HEAD WITH 09/15/2023 03:02:19 PM TECHNIQUE: CT of the head was performed with the administration of 75 mL of intravenous iohexol  (OMNIPAQUE ) 300 mg/mL solution. Automated exposure control, iterative reconstruction, and/or weight based adjustment of the mA/kV was utilized to reduce the radiation dose to as low as reasonably achievable. COMPARISON: Earlier same day. CLINICAL HISTORY: Mental status change, unknown cause. Per [facility] pt woke up w/ confusion this AM. Pt LKW 2130 last night. Pt w/ hx of parkinsons. Pt c/o of burning w/ urination and pain to left arm from covid vaccine yesterday. FINDINGS: BRAIN AND VENTRICLES: Bilateral deep brain stimulation leads terminating in the cerebellar peduncles bilaterally. Vasogenic edema in the left frontal lobe extending into the corona radiata and extending into the posterior superior left temporal lobe. Associated hyperattenuating areas concerning for interval development of hemorrhage. Similar appearance of edema and mass effect without significant midline shift. Within the limitations  of artifact there is no evidence of extraaxial fluid. There is no evidence of hemorrhage or edema adjacent to the deep brain stimulation leads. The basilar cisterns are patent. ORBITS: Bilateral lens replacement. SINUSES AND MASTOIDS: Mild mucosal thickening in the ethmoid sinuses. SOFT TISSUES AND SKULL: The calvarium is intact. Similar appearance of extracranial portion of the leads. IMPRESSION: 1. Similar vasogenic edema primarily in the left frontal lobe with interval development of intraparenchymal hemorrhage. 2. No change in mass effect. No midline shift. 3. No evidence of hemorrhage or edema adjacent to the deep brain stimulation leads, within the limitations of artifact. 4. Findings discussed with Dr. Cleotilde at 4:37PM on 09/15/23. Electronically signed by: Donnice Mania MD 09/15/2023 04:49 PM EDT RP Workstation: [id number]   MR Brain W and Wo Contrast Addendum Date: 09/15/2023  ADDENDUM #1  ADDENDUM: Finding discussed with Dr. Cleotilde at 6:30PM on 09/15/23. ---------------------------------------------------- Electronically signed by: Donnice Mania MD 09/15/2023 06:31 PM EDT RP Workstation: [id number]   Result Date: 09/15/2023  ORIGINAL REPORTEXAM: MRI BRAIN WITH AND WITHOUT CONTRAST 09/15/2023 04:47:27 PM TECHNIQUE: Multiplanar multisequence MRI of the head/brain was performed with and without the administration of intravenous contrast. COMPARISON: Earlier same day CT. CLINICAL HISTORY: Mental status change, unknown cause. AMS, pt has 2 conditional deep brain stimulators. FINDINGS: BRAIN AND VENTRICLES: There is a large ill-defined region of T2/FLAIR hyperintensity primarily within the left frontal lobe with extension into the high left parietal lobe near the vertex as well as extending into the left corona radiata, posterior superior left temporal lobe and partially extending into the left basal ganglia. Within this region there are irregular nodular areas of enhancement primarily along the periphery of T2  signal abnormality. There is an additional region of mild T2/FLAIR signal abnormality within the centrum semiovale with few additional areas of irregular enhancement. There is T2 shine through throughout the region of signal abnormality. One small focus of restricted diffusion within the lesion likely corresponding to a focus of enhancement. There is no definite infarct. Mild parenchymal volume loss. There is mild local mass effect within the left frontoparietal lobes with sulcal effacement and partial effacement of the left lateral ventricle. No midline shift. The basilar cisterns are patent. ORBITS: Bilateral lens replacement. SINUSES: Mild mucosal thickening in the ethmoid sinuses. BONES AND SOFT TISSUES: Bilateral deep  brain stimulation leads noted. No significant signal abnormality adjacent to the deep brain stimulation leads. IMPRESSION: 1. Large ill-defined region of T2/FLAIR hyperintensity involving the left frontal lobe, high left parietal lobe, left corona radiata, posterior superior left temporal lobe, and partially the left basal ganglia, with irregular peripheral enhancement and mild local mass effect. No midline shift. 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 2. Additional mild T2/FLAIR signal abnormality within the right centrum semiovale with irregular enhancement. 3. No evidence of infarct or hemorrhage. Electronically signed by: Donnice Mania MD 09/15/2023 06:26 PM EDT RP Workstation: [id number]   CT Head Wo Contrast Result Date: 09/15/2023 CLINICAL DATA:  Mental status change, unknown cause. EXAM: CT HEAD WITHOUT CONTRAST TECHNIQUE: Contiguous axial images were obtained from the base of the skull through the vertex without intravenous contrast. RADIATION DOSE REDUCTION: This exam was performed according to the departmental dose-optimization program which includes automated exposure control, adjustment of the mA and/or kV according to patient size and/or use of iterative reconstruction  technique. COMPARISON:  CT head from 07/05/2023. FINDINGS: Brain: There is new vasogenic cerebral edema involving the left superior frontal lobe. This finding is new since the prior study. Differential diagnosis includes neoplastic process, infection, inflammation, etc. Further evaluation with contrast-enhanced MRI is recommended. No evidence of intraparenchymal hemorrhage, hydrocephalus, extra-axial collection or midline shift. There is bilateral periventricular hypodensity, which is non-specific but most likely seen in the settings of microvascular ischemic changes. Mild in extent. Otherwise normal appearance of brain parenchyma. Ventricles are normal. Cerebral volume is age appropriate. Vascular: No hyperdense vessel or unexpected calcification. Skull: Normal. Negative for fracture or focal lesion. Sinuses/Orbits: No acute finding. Mild mucoperiosteal thickening noted in the bilateral ethmoidal air cells. Other: Stable appearance of frontal approach bilateral deep brain stimulator leads. Visualized mastoid air cells are unremarkable. No mastoid effusion. IMPRESSION: 1. New vasogenic cerebral edema involving the left superior frontal lobe. Differential diagnosis includes neoplastic process, infection, inflammation, etc. Further evaluation with contrast-enhanced MRI is recommended. 2. No acute intracranial hemorrhage. No midline shift. Electronically Signed   By: Ree Molt M.D.   On: 09/15/2023 10:02   DG Chest Port 1 View Result Date: 09/15/2023 CLINICAL DATA:  Weakness. EXAM: PORTABLE CHEST 1 VIEW COMPARISON:  05/16/2023. FINDINGS: Bilateral lung fields are clear. Bilateral costophrenic angles are clear. Normal cardio-mediastinal silhouette. No acute osseous abnormalities. The soft tissues are within normal limits. Redemonstration of 2 battery packs overlying the upper chest with the leads extending superiorly towards the neck. IMPRESSION: *No active disease. Electronically Signed   By: Ree Molt  M.D.   On: 09/15/2023 09:56    Scheduled Meds:  carbidopa -levodopa   2 tablet Oral QID   dexamethasone   4 mg Oral Q12H   fludrocortisone   0.2 mg Oral TID WC   midodrine   5 mg Oral TID WC   sodium chloride  flush  3 mL Intravenous Q12H   Continuous Infusions:  lactated ringers  40 mL/hr at 09/17/23 1612     LOS: 2 days   Time spent: 55 mins  Jasminemarie Sherrard Vicci, MD How to contact the [facility] Attending or Consulting provider 7A - 7P or covering provider during after hours 7P -7A, for this patient?  Check the care team in [facility] and look for a) attending/consulting [facility] provider listed and b) the [facility] team listed Log into [email or web address] to find provider on call.  Locate the [facility] provider you are looking for under [facility] and page to a number that you can be directly reached. If you still have  difficulty reaching the provider, please page the [facility] (Director on Call) for the Hospitalists listed on amion for assistance.  09/18/2023, 2:50 PM

## 2023-09-19 DIAGNOSIS — G20A1 Parkinson's disease without dyskinesia, without mention of fluctuations: Secondary | ICD-10-CM | POA: Diagnosis not present

## 2023-09-19 DIAGNOSIS — R4182 Altered mental status, unspecified: Secondary | ICD-10-CM | POA: Diagnosis not present

## 2023-09-19 DIAGNOSIS — I951 Orthostatic hypotension: Secondary | ICD-10-CM | POA: Diagnosis not present

## 2023-09-19 DIAGNOSIS — G9389 Other specified disorders of brain: Secondary | ICD-10-CM | POA: Diagnosis not present

## 2023-09-19 NOTE — Plan of Care (Signed)
 Patient repositions self in bed, son Selinda at bedside through the night.  Patient offers no complaints.

## 2023-09-19 NOTE — Plan of Care (Signed)
 Encouraged patient to get out of bed for meals, explained importance of movement for health and wellness, patient discussed reasons  he is not willing and changes subject frequently.  Ice used for joint pain patient slept well through the night.

## 2023-09-19 NOTE — Progress Notes (Signed)
 PROGRESS NOTE   [name] [name]  FMW:[id number] DOB: 1945/09/13 DOA: 09/15/2023 PCP: Beverley Louann ONEIDA, MD   Chief Complaint  Patient presents with   Altered Mental Status   Level of care: Med-Surg  Brief Admission History:  78 y.o. male with medical history significant for Parkinson's disease with deep brain stimulator, orthostatic hypotension, and BPH who presents with confusion and change in speech.   Patient was noted by family to be confused since yesterday morning.  He has had similar confusion in the setting of UTI previously and this is what they were suspecting.  Today, he remained confused and was noted to have slurred speech.  This concerned the family for possible stroke and he was sent to the ED.  Patient states that he is hungry but has no other acute complaints.   ED Course: Upon arrival to the ED, patient is found to be febrile 38.2 C with normal RR, normal HR, and elevated BP.  Labs are most notable for potassium 2.6, normal creatinine, normal WBC, negative respiratory virus panel, and undetectable ammonia.  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   Neurosurgery (Dr. Darnella) was consulted by the ED physician and recommended starting Decadron  twice daily and having the patient follow-up in the clinic early next week.  Blood and urine cultures were collected in the ED and the patient was given a liter of saline, oral and IV potassium, acetaminophen , Rocephin , and Decadron .   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  - Neurosurgery recommends starting Decadron  for cerebral edema and close outpatient follow-up  - Continue Decadron , consult PT, OT, and SLP, use delirium precautions   - given his deconditioned state he was recommended for SNF rehab which [facility] says he can go on 09/20/23  - ambulatory referral to neurosurgery made and he has appt on Wed 09/22/23 for consultation per  wife    Hypokalemia  - repleted    Fever - resolved  - Transient fever noted; no other SIRS criteria or apparent infection  - Follow cultures and clinical course off of antibiotics for now    Orthostatic hypotension  - Florinef , midodrine  held  Hypertension - elevated BPs likely from steroids - hold midodrine  for now - IV hydralazine  ordered PRN SbP>160    Parkinson disease  - Sinemet     Generalized Weakness Adult Failure to Thrive  - given high likelihood of primary CNS neoplasm and poor overall health condition and poor overall prognosis, I am requesting a consultation with palliative care for goals of care discussion - PT assessment confirms severe debilitated state and need for skilled nursing care  - TOC working on SNF placement ([facility])  Steroid induced hyperglycemia - wife refused SSI coverage (sensitive) and CBG monitoring - Goal BS 140-180   DVT prophylaxis: enoxaparin  (wife declined) added SCDs on 8/16 Code Status: Full  Family Communication: wife updated at bedside Disposition: SNF    Consultants:  Neurosurgery Procedures:   Antimicrobials:    Subjective: Pt eating well on steroids, no specific complaints, agreeable to SNF rehab and neurosurgery OV.   Objective: Vitals:   09/18/23 2020 09/19/23 0521 09/19/23 0603 09/19/23 1323  BP: (!) 151/79 (!) 168/90 (!) 146/80 139/80  Pulse: 65 71 68 73  Resp: 18 18  17   Temp: 98 F (36.7 C) 97.7 F (36.5 C)  (!) 97.5 F (36.4 C)  TempSrc: Oral Oral  Oral  SpO2:  96% 96%  98%  Weight:      Height:        Intake/Output Summary (Last 24 hours) at 09/19/2023 1443 Last data filed at 09/19/2023 0738 Gross per 24 hour  Intake 480 ml  Output 1400 ml  Net -920 ml   Filed Weights   09/15/23 0849  Weight: 71 kg   Examination:  General exam: Appears calm and comfortable severely debilitated state.  Respiratory system: Clear to auscultation. Respiratory effort normal. Cardiovascular system: normal S1 & S2  heard. No JVD, murmurs, rubs, gallops or clicks. No pedal edema. Gastrointestinal system: Abdomen is nondistended, soft and nontender. No organomegaly or masses felt. Normal bowel sounds heard. Central nervous system: Alert and oriented x1 nonverbal today. Resting and intentional tremors of advanced parkinson's disease  Extremities: severe strength deficits. Skin: No rashes, lesions or ulcers. Psychiatry: Judgement and insight appear diminished. Mood & affect flat.   Data Reviewed: I have personally reviewed following labs and imaging studies  CBC: Recent Labs  Lab 09/15/23 0850 09/16/23 0423  WBC 7.6 4.0  HGB 12.2* 11.5*  HCT 35.7* 34.6*  MCV 94.4 95.1  PLT 263 241    Basic Metabolic Panel: Recent Labs  Lab 09/15/23 0850 09/15/23 1000 09/15/23 1912 09/16/23 0423 09/17/23 0454 09/18/23 0424  NA 140  --  140 138 137 138  K 2.6*  --  3.7 3.1* 3.5 3.5  CL 103  --  104 104 106 106  CO2 28  --  27 26 25 26   GLUCOSE 95  --  95 127* 154* 127*  BUN 18  --  14 16 22 23   CREATININE 0.83  --  0.79 0.77 0.89 0.83  CALCIUM  8.3*  --  8.0* 8.1* 8.8* 8.6*  MG  --  2.0  --  2.0  --   --     CBG: Recent Labs  Lab 09/17/23 2047 09/18/23 0358 09/18/23 0718 09/18/23 1124 09/18/23 1606  GLUCAP 159* 135* 115* 121* 153*    Recent Results (from the past 240 hours)  Resp panel by RT-PCR (RSV, Flu A&B, Covid) Anterior Nasal Swab     Status: None   Collection Time: 09/15/23  9:20 AM   Specimen: Anterior Nasal Swab  Result Value Ref Range Status   SARS Coronavirus 2 by RT PCR NEGATIVE NEGATIVE Final    Comment: (NOTE) SARS-CoV-2 target nucleic acids are NOT DETECTED.  The SARS-CoV-2 RNA is generally detectable in upper respiratory specimens during the acute phase of infection. The lowest concentration of SARS-CoV-2 viral copies this assay can detect is 138 copies/mL. A negative result does not preclude SARS-Cov-2 infection and should not be used as the sole basis for treatment  or other patient management decisions. A negative result may occur with  improper specimen collection/handling, submission of specimen other than nasopharyngeal swab, presence of viral mutation(s) within the areas targeted by this assay, and inadequate number of viral copies(<138 copies/mL). A negative result must be combined with clinical observations, patient history, and epidemiological information. The expected result is Negative.  Fact Sheet for Patients:  BloggerCourse.com  Fact Sheet for Healthcare Providers:  SeriousBroker.it  This test is no t yet approved or cleared by the [place]  FDA and  has been authorized for detection and/or diagnosis of SARS-CoV-2 by FDA under an Emergency Use Authorization (EUA). This EUA will remain  in effect (meaning this test can be used) for the duration of the COVID-19 declaration under Section 564(b)(1) of the Act, 21 U.S.C.section  360bbb-3(b)(1), unless the authorization is terminated  or revoked sooner.       Influenza A by PCR NEGATIVE NEGATIVE Final   Influenza B by PCR NEGATIVE NEGATIVE Final    Comment: (NOTE) The Xpert Xpress SARS-CoV-2/FLU/RSV plus assay is intended as an aid in the diagnosis of influenza from Nasopharyngeal swab specimens and should not be used as a sole basis for treatment. Nasal washings and aspirates are unacceptable for Xpert Xpress SARS-CoV-2/FLU/RSV testing.  Fact Sheet for Patients: BloggerCourse.com  Fact Sheet for Healthcare Providers: SeriousBroker.it  This test is not yet approved or cleared by the [place]  FDA and has been authorized for detection and/or diagnosis of SARS-CoV-2 by FDA under an Emergency Use Authorization (EUA). This EUA will remain in effect (meaning this test can be used) for the duration of the COVID-19 declaration under Section 564(b)(1) of the Act, 21 U.S.C. section  360bbb-3(b)(1), unless the authorization is terminated or revoked.     Resp Syncytial Virus by PCR NEGATIVE NEGATIVE Final    Comment: (NOTE) Fact Sheet for Patients: BloggerCourse.com  Fact Sheet for Healthcare Providers: SeriousBroker.it  This test is not yet approved or cleared by the [place]  FDA and has been authorized for detection and/or diagnosis of SARS-CoV-2 by FDA under an Emergency Use Authorization (EUA). This EUA will remain in effect (meaning this test can be used) for the duration of the COVID-19 declaration under Section 564(b)(1) of the Act, 21 U.S.C. section 360bbb-3(b)(1), unless the authorization is terminated or revoked.  Performed at [facility], [address], [place], [place] [address]   Urine Culture     Status: Abnormal   Collection Time: 09/15/23 10:32 AM   Specimen: Urine, Clean Catch  Result Value Ref Range Status   Specimen Description   Final    URINE, CLEAN CATCH Performed at [facility], [address], [place], [place] [address]    Special Requests   Final    NONE Performed at [facility], [address] [place], [place] [address]    Culture MULTIPLE SPECIES PRESENT, SUGGEST RECOLLECTION (A)  Final   Report Status 09/17/2023 FINAL  Final  Culture, blood (routine x 2)     Status: None (Preliminary result)   Collection Time: 09/15/23  2:01 PM   Specimen: BLOOD  Result Value Ref Range Status   Specimen Description BLOOD BLOOD RIGHT ARM  Final   Special Requests   Final    BOTTLES DRAWN AEROBIC AND ANAEROBIC Blood Culture adequate volume   Culture   Final    NO GROWTH 4 DAYS Performed at [facility], [address] [place], [place] [address]    Report Status PENDING  Incomplete  Culture, blood (routine x 2)     Status: None (Preliminary result)   Collection Time: 09/15/23  2:06 PM   Specimen: BLOOD  Result Value Ref Range Status   Specimen Description BLOOD BLOOD LEFT HAND   Final   Special Requests   Final    BOTTLES DRAWN AEROBIC ONLY Blood Culture adequate volume   Culture   Final    NO GROWTH 4 DAYS Performed at [facility], [address], [place], [place] [address]    Report Status PENDING  Incomplete     Radiology Studies: CT HEAD W CONTRAST ( ) Addendum Date: 09/15/2023 ADDENDUM #1  ADDENDUM: Correction to impression. This examination is a post contrast exam. There are foci of nodular/irregular enhancement seen in the region of edema in the left frontal lobe corresponding to areas  of enhancement on MRI. Findings most concerning for CNS neoplasm. No evidence of hemorrhage. Findings discussed with Dr Cleotilde at 6:30PM on 09/15/23 at the time of MRI interpretation. ---------------------------------------------------- Electronically signed by: Donnice Mania MD 09/15/2023 06:33 PM EDT RP Workstation: [id number]   Result Date: 09/15/2023  ORIGINAL REPORT * EXAM: CT HEAD WITH 09/15/2023 03:02:19 PM TECHNIQUE: CT of the head was performed with the administration of 75 mL of intravenous iohexol  (OMNIPAQUE ) 300 mg/mL solution. Automated exposure control, iterative reconstruction, and/or weight based adjustment of the mA/kV was utilized to reduce the radiation dose to as low as reasonably achievable. COMPARISON: Earlier same day. CLINICAL HISTORY: Mental status change, unknown cause. Per [facility] pt woke up w/ confusion this AM. Pt LKW 2130 last night. Pt w/ hx of parkinsons. Pt c/o of burning w/ urination and pain to left arm from covid vaccine yesterday. FINDINGS: BRAIN AND VENTRICLES: Bilateral deep brain stimulation leads terminating in the cerebellar peduncles bilaterally. Vasogenic edema in the left frontal lobe extending into the corona radiata and extending into the posterior superior left temporal lobe. Associated hyperattenuating areas concerning for interval development of hemorrhage. Similar appearance of edema and mass effect without significant midline shift. Within  the limitations of artifact there is no evidence of extraaxial fluid. There is no evidence of hemorrhage or edema adjacent to the deep brain stimulation leads. The basilar cisterns are patent. ORBITS: Bilateral lens replacement. SINUSES AND MASTOIDS: Mild mucosal thickening in the ethmoid sinuses. SOFT TISSUES AND SKULL: The calvarium is intact. Similar appearance of extracranial portion of the leads. IMPRESSION: 1. Similar vasogenic edema primarily in the left frontal lobe with interval development of intraparenchymal hemorrhage. 2. No change in mass effect. No midline shift. 3. No evidence of hemorrhage or edema adjacent to the deep brain stimulation leads, within the limitations of artifact. 4. Findings discussed with Dr. Cleotilde at 4:37PM on 09/15/23. Electronically signed by: Donnice Mania MD 09/15/2023 04:49 PM EDT RP Workstation: [id number]   MR Brain W and Wo Contrast Addendum Date: 09/15/2023  ADDENDUM #1  ADDENDUM: Finding discussed with Dr. Cleotilde at 6:30PM on 09/15/23. ---------------------------------------------------- Electronically signed by: Donnice Mania MD 09/15/2023 06:31 PM EDT RP Workstation: [id number]   Result Date: 09/15/2023  ORIGINAL REPORTEXAM: MRI BRAIN WITH AND WITHOUT CONTRAST 09/15/2023 04:47:27 PM TECHNIQUE: Multiplanar multisequence MRI of the head/brain was performed with and without the administration of intravenous contrast. COMPARISON: Earlier same day CT. CLINICAL HISTORY: Mental status change, unknown cause. AMS, pt has 2 conditional deep brain stimulators. FINDINGS: BRAIN AND VENTRICLES: There is a large ill-defined region of T2/FLAIR hyperintensity primarily within the left frontal lobe with extension into the high left parietal lobe near the vertex as well as extending into the left corona radiata, posterior superior left temporal lobe and partially extending into the left basal ganglia. Within this region there are irregular nodular areas of enhancement primarily along the  periphery of T2 signal abnormality. There is an additional region of mild T2/FLAIR signal abnormality within the centrum semiovale with few additional areas of irregular enhancement. There is T2 shine through throughout the region of signal abnormality. One small focus of restricted diffusion within the lesion likely corresponding to a focus of enhancement. There is no definite infarct. Mild parenchymal volume loss. There is mild local mass effect within the left frontoparietal lobes with sulcal effacement and partial effacement of the left lateral ventricle. No midline shift. The basilar cisterns are patent. ORBITS: Bilateral lens replacement. SINUSES: Mild mucosal thickening in the ethmoid sinuses. BONES  AND SOFT TISSUES: Bilateral deep brain stimulation leads noted. No significant signal abnormality adjacent to the deep brain stimulation leads. IMPRESSION: 1. Large ill-defined region of T2/FLAIR hyperintensity involving the left frontal lobe, high left parietal lobe, left corona radiata, posterior superior left temporal lobe, and partially the left basal ganglia, with irregular peripheral enhancement and mild local mass effect. No midline shift. 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 2. Additional mild T2/FLAIR signal abnormality within the right centrum semiovale with irregular enhancement. 3. No evidence of infarct or hemorrhage. Electronically signed by: Donnice Mania MD 09/15/2023 06:26 PM EDT RP Workstation: [id number]   CT Head Wo Contrast Result Date: 09/15/2023 CLINICAL DATA:  Mental status change, unknown cause. EXAM: CT HEAD WITHOUT CONTRAST TECHNIQUE: Contiguous axial images were obtained from the base of the skull through the vertex without intravenous contrast. RADIATION DOSE REDUCTION: This exam was performed according to the departmental dose-optimization program which includes automated exposure control, adjustment of the mA and/or kV according to patient size and/or use of iterative  reconstruction technique. COMPARISON:  CT head from 07/05/2023. FINDINGS: Brain: There is new vasogenic cerebral edema involving the left superior frontal lobe. This finding is new since the prior study. Differential diagnosis includes neoplastic process, infection, inflammation, etc. Further evaluation with contrast-enhanced MRI is recommended. No evidence of intraparenchymal hemorrhage, hydrocephalus, extra-axial collection or midline shift. There is bilateral periventricular hypodensity, which is non-specific but most likely seen in the settings of microvascular ischemic changes. Mild in extent. Otherwise normal appearance of brain parenchyma. Ventricles are normal. Cerebral volume is age appropriate. Vascular: No hyperdense vessel or unexpected calcification. Skull: Normal. Negative for fracture or focal lesion. Sinuses/Orbits: No acute finding. Mild mucoperiosteal thickening noted in the bilateral ethmoidal air cells. Other: Stable appearance of frontal approach bilateral deep brain stimulator leads. Visualized mastoid air cells are unremarkable. No mastoid effusion. IMPRESSION: 1. New vasogenic cerebral edema involving the left superior frontal lobe. Differential diagnosis includes neoplastic process, infection, inflammation, etc. Further evaluation with contrast-enhanced MRI is recommended. 2. No acute intracranial hemorrhage. No midline shift. Electronically Signed   By: Ree Molt M.D.   On: 09/15/2023 10:02   DG Chest Port 1 View Result Date: 09/15/2023 CLINICAL DATA:  Weakness. EXAM: PORTABLE CHEST 1 VIEW COMPARISON:  05/16/2023. FINDINGS: Bilateral lung fields are clear. Bilateral costophrenic angles are clear. Normal cardio-mediastinal silhouette. No acute osseous abnormalities. The soft tissues are within normal limits. Redemonstration of 2 battery packs overlying the upper chest with the leads extending superiorly towards the neck. IMPRESSION: *No active disease. Electronically Signed   By:  Ree Molt M.D.   On: 09/15/2023 09:56    Scheduled Meds:  carbidopa -levodopa   2 tablet Oral QID   dexamethasone   4 mg Oral Q12H   fludrocortisone   0.2 mg Oral TID WC   sodium chloride  flush  3 mL Intravenous Q12H   Continuous Infusions:   LOS: 3 days   Time spent: 55 mins  Shundra Wirsing Vicci, MD How to contact the [facility] Attending or Consulting provider 7A - 7P or covering provider during after hours 7P -7A, for this patient?  Check the care team in [facility] and look for a) attending/consulting [facility] provider listed and b) the [facility] team listed Log into [email or web address] to find provider on call.  Locate the [facility] provider you are looking for under [facility] and page to a number that you can be directly reached. If you still have difficulty reaching the provider, please page the [facility] (Director on Call) for the Hospitalists  listed on amion for assistance.  09/19/2023, 2:43 PM

## 2023-09-20 DIAGNOSIS — E876 Hypokalemia: Secondary | ICD-10-CM | POA: Diagnosis not present

## 2023-09-20 DIAGNOSIS — I951 Orthostatic hypotension: Secondary | ICD-10-CM | POA: Diagnosis not present

## 2023-09-20 DIAGNOSIS — G9389 Other specified disorders of brain: Secondary | ICD-10-CM | POA: Diagnosis not present

## 2023-09-20 DIAGNOSIS — R4182 Altered mental status, unspecified: Secondary | ICD-10-CM | POA: Diagnosis not present

## 2023-09-20 LAB — CULTURE, BLOOD (ROUTINE X 2)
Culture: NO GROWTH
Culture: NO GROWTH
Special Requests: ADEQUATE
Special Requests: ADEQUATE

## 2023-09-20 MED ORDER — CARBIDOPA-LEVODOPA 25-100 MG PO TABS: ORAL_TABLET | ORAL | Status: AC

## 2023-09-20 MED ORDER — DEXAMETHASONE 4 MG PO TABS: 4.0000 mg | ORAL_TABLET | Freq: Two times a day (BID) | ORAL | 0 refills | Status: AC

## 2023-09-20 NOTE — Discharge Instructions (Signed)
 IMPORTANT INFORMATION: PAY CLOSE ATTENTION   PHYSICIAN DISCHARGE INSTRUCTIONS  Follow with Primary care provider  Beverley Louann DASEN, MD  and other consultants as instructed by your Hospitalist Physician  SEEK MEDICAL CARE OR RETURN TO EMERGENCY ROOM IF SYMPTOMS COME BACK, WORSEN OR NEW PROBLEM DEVELOPS   Please note: You were cared for by a hospitalist during your hospital stay. Every effort will be made to forward records to your primary care provider.  You can request that your primary care provider send for your hospital records if they have not received them.  Once you are discharged, your primary care physician will handle any further medical issues. Please note that NO REFILLS for any discharge medications will be authorized once you are discharged, as it is imperative that you return to your primary care physician (or establish a relationship with a primary care physician if you do not have one) for your post hospital discharge needs so that they can reassess your need for medications and monitor your lab values.  Please get a complete blood count and chemistry panel checked by your Primary MD at your next visit, and again as instructed by your Primary MD.  Get Medicines reviewed and adjusted: Please take all your medications with you for your next visit with your Primary MD  Laboratory/radiological data: Please request your Primary MD to go over all hospital tests and procedure/radiological results at the follow up, please ask your primary care provider to get all Hospital records sent to his/her office.  In some cases, they will be blood work, cultures and biopsy results pending at the time of your discharge. Please request that your primary care provider follow up on these results.  If you are diabetic, please bring your blood sugar readings with you to your follow up appointment with primary care.    Please call and make your follow up appointments as soon as possible.    Also Note  the following: If you experience worsening of your admission symptoms, develop shortness of breath, life threatening emergency, suicidal or homicidal thoughts you must seek medical attention immediately by calling 911 or calling your MD immediately  if symptoms less severe.  You must read complete instructions/literature along with all the possible adverse reactions/side effects for all the Medicines you take and that have been prescribed to you. Take any new Medicines after you have completely understood and accpet all the possible adverse reactions/side effects.   Do not drive when taking Pain medications or sleeping medications (Benzodiazepines)  Do not take more than prescribed Pain, Sleep and Anxiety Medications. It is not advisable to combine anxiety,sleep and pain medications without talking with your primary care practitioner  Special Instructions: If you have smoked or chewed Tobacco  in the last 2 yrs please stop smoking, stop any regular Alcohol   and or any Recreational drug use.  Wear Seat belts while driving.  Do not drive if taking any narcotic, mind altering or controlled substances or recreational drugs or alcohol .

## 2023-09-20 NOTE — Plan of Care (Signed)

## 2023-09-20 NOTE — Progress Notes (Signed)
 Patients wife spoke with this writer this am and stated that she wanted to speak to the social worker on duty today regarding the patients discharge as she went to the facility that the patient has accepted a bed offer at, and upon the wifes personal assessment of the facility in person, she does not want the patient to be discharged to this facility. Pts wife stated that that place looks so out of date. The walls are lined with carpet with holes all in it. Its terrible and I am not sending him there. This Clinical research associate informed pts wife that the social worker was not yet on duty but that this Clinical research associate would pass along her message to the dayshift nurse so the dayshift nurse can inform the social worker of her request and of her refusal to allow the patient to be discharged to this facility.

## 2023-09-20 NOTE — Discharge Summary (Signed)
 Physician Discharge Summary  [name] [name] FMW:[id number] DOB: [birth year] DOA: 09/15/2023  PCP: Beverley Louann ONEIDA, MD  Admit date: 09/15/2023 Discharge date: 09/20/2023  Admitted From:  Home  Disposition: Home with HH (declined SNF)  Recommendations for Outpatient Follow-up:  Follow up with neurosurgeon in 2 days as scheduled for consultation   Home Health: PT/RN/Aide/SW  Discharge Condition: STABLE   CODE STATUS: FULL DIET: regular   Brief Hospitalization Summary: Please see all hospital notes, images, labs for full details of the hospitalization. Admission provider HPI:  78 y.o. male with medical history significant for Parkinson's disease with deep brain stimulator, orthostatic hypotension, and BPH who presents with confusion and change in speech.   Patient was noted by family to be confused since yesterday morning.  He has had similar confusion in the setting of UTI previously and this is what they were suspecting.  Today, he remained confused and was noted to have slurred speech.  This concerned the family for possible stroke and he was sent to the ED.  Patient states that he is hungry but has no other acute complaints.   ED Course: Upon arrival to the ED, patient is found to be febrile 38.2 C with normal RR, normal HR, and elevated BP.  Labs are most notable for potassium 2.6, normal creatinine, normal WBC, negative respiratory virus panel, and undetectable ammonia.  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   Neurosurgery (Dr. Darnella) was consulted by the ED physician and recommended starting Decadron  twice daily and having the patient follow-up in the clinic early next week.  Blood and urine cultures were collected in the ED and the patient was given a liter of saline, oral and IV potassium, acetaminophen , Rocephin , and Decadron .  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   - ambulatory referral to neurosurgery made and he has appt on Wed 09/22/23 for consultation per wife  - wife says she will be sure to take patient for appointment on Wed 8/20   Hypokalemia  - repleted    Fever - resolved  - Transient fever noted; no other SIRS criteria or apparent infection  - Follow cultures and clinical course off of antibiotics for now    Orthostatic hypotension  - Florinef , midodrine  held   Hypertension - elevated BPs likely from steroids - hold midodrine  for now - IV hydralazine  ordered PRN SbP>160    Parkinson disease  - Sinemet      Generalized Weakness Adult Failure to Thrive  - given high likelihood of primary CNS neoplasm and poor overall health condition and poor overall prognosis, I am requesting a consultation with palliative care for goals of care discussion - PT assessment confirms severe debilitated state and need for skilled nursing care  - TOC working on SNF placement ([facility])   Steroid induced hyperglycemia - wife refused SSI coverage (sensitive) and CBG monitoring - Goal BS 140-180 in hospital  Discharge Diagnoses:  Principal Problem:   Brain mass Active Problems:   Parkinson's disease (HCC)   Orthostatic hypotension  Hypokalemia   Fever   Altered mental status   Discharge Instructions: Discharge Instructions     Ambulatory referral to Neurosurgery   Complete by: As directed    Please Select To Department: [facility] for Nerve or Spine  Please select To Department: [facility] AT [place] for Cranial or Neurovascular   Clinical Indicator: Cranial       Allergies as of 09/20/2023       Reactions   Rivastigmine Tartrate Nausea And Vomiting    Can tolerate the patch but can not tolerate the pills   Tamsulosin Other (See Comments)   Severe joint pain   Alfuzosin     Other reaction(s): Other (See Comments) hypotension        Medication List     TAKE these medications    carbidopa -levodopa  25-100 MG tablet Commonly known as: SINEMET  IR 2-2.5 tablets 4 times per day at 6am/10:30am/2pm/6pm   cyanocobalamin  1000 MCG tablet Take 1 tablet (1,000 mcg total) by mouth daily.   dexamethasone  4 MG tablet Commonly known as: DECADRON  Take 1 tablet (4 mg total) by mouth every 12 (twelve) hours for 14 days.   fludrocortisone  0.1 MG tablet Commonly known as: FLORINEF  Take 2 tablets (0.2 mg total) by mouth 3 (three) times daily with meals.   Inbrija  42 MG Caps Generic drug: Levodopa  We are going to start inbrija .  Remember that TWO capsules is ONE dosage (never inhale just one capsule).  You can inhale the capsules as needed up to 5 times per day, separated by 2 hour intervals.   midodrine  5 MG tablet Commonly known as: PROAMATINE  Take 1 tablet (5 mg total) by mouth 3 (three) times daily with meals.        Follow-up Information     Lanis Pupa, MD Follow up.   Specialty: Neurosurgery Why: Please call to schedule an appointment with Dr. Lanis regarding your MRI brain findings at your earliest convenience. Thank you Contact information: [address] [place] [address] [phone]         Beverley Louann DASEN, MD Follow up.   Specialty: Internal Medicine Contact information: [address] [place] [address] [phone]                Allergies  Allergen Reactions   Rivastigmine Tartrate Nausea And Vomiting     Can tolerate the patch but can not tolerate the pills   Tamsulosin Other (See Comments)    Severe joint pain   Alfuzosin      Other  reaction(s): Other (See Comments) hypotension   Allergies as of 09/20/2023       Reactions   Rivastigmine Tartrate Nausea And Vomiting    Can tolerate the patch but can not tolerate the pills   Tamsulosin Other (See Comments)   Severe joint pain   Alfuzosin     Other reaction(s): Other (See Comments) hypotension        Medication List     TAKE these medications    carbidopa -levodopa  25-100 MG tablet Commonly known as: SINEMET  IR 2-2.5 tablets 4 times per day at 6am/10:30am/2pm/6pm   cyanocobalamin  1000 MCG tablet Take 1 tablet (1,000 mcg total) by mouth daily.   dexamethasone  4 MG tablet Commonly known as: DECADRON  Take 1 tablet (4 mg total) by mouth every 12 (twelve) hours for 14 days.   fludrocortisone  0.1 MG tablet Commonly known as: FLORINEF  Take 2 tablets (0.2 mg total) by mouth 3 (three) times daily with meals.  Inbrija  42 MG Caps Generic drug: Levodopa  We are going to start inbrija .  Remember that TWO capsules is ONE dosage (never inhale just one capsule).  You can inhale the capsules as needed up to 5 times per day, separated by 2 hour intervals.   midodrine  5 MG tablet Commonly known as: PROAMATINE  Take 1 tablet (5 mg total) by mouth 3 (three) times daily with meals.        Procedures/Studies: CT HEAD W CONTRAST ( ) Addendum Date: 09/15/2023  ADDENDUM #1 *ADDENDUM: Correction to impression. This examination is a post contrast exam. There are foci of nodular/irregular enhancement seen in the region of edema in the left frontal lobe corresponding to areas of enhancement on MRI. Findings most concerning for CNS neoplasm. No evidence of hemorrhage. Findings discussed with Dr Cleotilde at 6:30PM on 09/15/23 at the time of MRI interpretation. ---------------------------------------------------- Electronically signed by: Donnice Mania MD 09/15/2023 06:33 PM EDT RP Workstation: [id number]   Result Date: 09/15/2023 * ORIGINAL REPORT * EXAM: CT HEAD WITH 09/15/2023  03:02:19 PM TECHNIQUE: CT of the head was performed with the administration of 75 mL of intravenous iohexol  (OMNIPAQUE ) 300 mg/mL solution. Automated exposure control, iterative reconstruction, and/or weight based adjustment of the mA/kV was utilized to reduce the radiation dose to as low as reasonably achievable. COMPARISON: Earlier same day. CLINICAL HISTORY: Mental status change, unknown cause. Per [facility] pt woke up w/ confusion this AM. Pt LKW 2130 last night. Pt w/ hx of parkinsons. Pt c/o of burning w/ urination and pain to left arm from covid vaccine yesterday. FINDINGS: BRAIN AND VENTRICLES: Bilateral deep brain stimulation leads terminating in the cerebellar peduncles bilaterally. Vasogenic edema in the left frontal lobe extending into the corona radiata and extending into the posterior superior left temporal lobe. Associated hyperattenuating areas concerning for interval development of hemorrhage. Similar appearance of edema and mass effect without significant midline shift. Within the limitations of artifact there is no evidence of extraaxial fluid. There is no evidence of hemorrhage or edema adjacent to the deep brain stimulation leads. The basilar cisterns are patent. ORBITS: Bilateral lens replacement. SINUSES AND MASTOIDS: Mild mucosal thickening in the ethmoid sinuses. SOFT TISSUES AND SKULL: The calvarium is intact. Similar appearance of extracranial portion of the leads. IMPRESSION: 1. Similar vasogenic edema primarily in the left frontal lobe with interval development of intraparenchymal hemorrhage. 2. No change in mass effect. No midline shift. 3. No evidence of hemorrhage or edema adjacent to the deep brain stimulation leads, within the limitations of artifact. 4. Findings discussed with Dr. Cleotilde at 4:37PM on 09/15/23. Electronically signed by: Donnice Mania MD 09/15/2023 04:49 PM EDT RP Workstation: [id number]   MR Brain W and Wo Contrast Addendum Date: 09/15/2023 * ADDENDUM #1 * ADDENDUM:  Finding discussed with Dr. Cleotilde at 6:30PM on 09/15/23. ---------------------------------------------------- Electronically signed by: Donnice Mania MD 09/15/2023 06:31 PM EDT RP Workstation: [id number]   Result Date: 09/15/2023 * ORIGINAL REPORT * EXAM: MRI BRAIN WITH AND WITHOUT CONTRAST 09/15/2023 04:47:27 PM TECHNIQUE: Multiplanar multisequence MRI of the head/brain was performed with and without the administration of intravenous contrast. COMPARISON: Earlier same day CT. CLINICAL HISTORY: Mental status change, unknown cause. AMS, pt has 2 conditional deep brain stimulators. FINDINGS: BRAIN AND VENTRICLES: There is a large ill-defined region of T2/FLAIR hyperintensity primarily within the left frontal lobe with extension into the high left parietal lobe near the vertex as well as extending into the left corona radiata, posterior superior left temporal lobe and partially extending into the  left basal ganglia. Within this region there are irregular nodular areas of enhancement primarily along the periphery of T2 signal abnormality. There is an additional region of mild T2/FLAIR signal abnormality within the centrum semiovale with few additional areas of irregular enhancement. There is T2 shine through throughout the region of signal abnormality. One small focus of restricted diffusion within the lesion likely corresponding to a focus of enhancement. There is no definite infarct. Mild parenchymal volume loss. There is mild local mass effect within the left frontoparietal lobes with sulcal effacement and partial effacement of the left lateral ventricle. No midline shift. The basilar cisterns are patent. ORBITS: Bilateral lens replacement. SINUSES: Mild mucosal thickening in the ethmoid sinuses. BONES AND SOFT TISSUES: Bilateral deep brain stimulation leads noted. No significant signal abnormality adjacent to the deep brain stimulation leads. IMPRESSION: 1. Large ill-defined region of T2/FLAIR hyperintensity  involving the left frontal lobe, high left parietal lobe, left corona radiata, posterior superior left temporal lobe, and partially the left basal ganglia, with irregular peripheral enhancement and mild local mass effect. No midline shift. 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 2. Additional mild T2/FLAIR signal abnormality within the right centrum semiovale with irregular enhancement. 3. No evidence of infarct or hemorrhage. Electronically signed by: Donnice Mania MD 09/15/2023 06:26 PM EDT RP Workstation: [id number]   CT Head Wo Contrast Result Date: 09/15/2023 CLINICAL DATA:  Mental status change, unknown cause. EXAM: CT HEAD WITHOUT CONTRAST TECHNIQUE: Contiguous axial images were obtained from the base of the skull through the vertex without intravenous contrast. RADIATION DOSE REDUCTION: This exam was performed according to the departmental dose-optimization program which includes automated exposure control, adjustment of the mA and/or kV according to patient size and/or use of iterative reconstruction technique. COMPARISON:  CT head from 07/05/2023. FINDINGS: Brain: There is new vasogenic cerebral edema involving the left superior frontal lobe. This finding is new since the prior study. Differential diagnosis includes neoplastic process, infection, inflammation, etc. Further evaluation with contrast-enhanced MRI is recommended. No evidence of intraparenchymal hemorrhage, hydrocephalus, extra-axial collection or midline shift. There is bilateral periventricular hypodensity, which is non-specific but most likely seen in the settings of microvascular ischemic changes. Mild in extent. Otherwise normal appearance of brain parenchyma. Ventricles are normal. Cerebral volume is age appropriate. Vascular: No hyperdense vessel or unexpected calcification. Skull: Normal. Negative for fracture or focal lesion. Sinuses/Orbits: No acute finding. Mild mucoperiosteal thickening noted in the bilateral ethmoidal air  cells. Other: Stable appearance of frontal approach bilateral deep brain stimulator leads. Visualized mastoid air cells are unremarkable. No mastoid effusion. IMPRESSION: 1. New vasogenic cerebral edema involving the left superior frontal lobe. Differential diagnosis includes neoplastic process, infection, inflammation, etc. Further evaluation with contrast-enhanced MRI is recommended. 2. No acute intracranial hemorrhage. No midline shift. Electronically Signed   By: Ree Molt M.D.   On: 09/15/2023 10:02   DG Chest Port 1 View Result Date: 09/15/2023 CLINICAL DATA:  Weakness. EXAM: PORTABLE CHEST 1 VIEW COMPARISON:  05/16/2023. FINDINGS: Bilateral lung fields are clear. Bilateral costophrenic angles are clear. Normal cardio-mediastinal silhouette. No acute osseous abnormalities. The soft tissues are within normal limits. Redemonstration of 2 battery packs overlying the upper chest with the leads extending superiorly towards the neck. IMPRESSION: *No active disease. Electronically Signed   By: Ree Molt M.D.   On: 09/15/2023 09:56     Subjective: Pt without complaints.    Discharge Exam: Vitals:   09/20/23 0607 09/20/23 0648  BP: (!) 171/93 (!) 141/72  Pulse:  75  Resp:    Temp:    SpO2:     Vitals:   09/19/23 2207 09/20/23 0351 09/20/23 0607 09/20/23 0648  BP: (!) 149/87 (!) 171/93 (!) 171/93 (!) 141/72  Pulse: 69 71  75  Resp: 19 (!) 21    Temp: 97.7 F (36.5 C) 97.7 F (36.5 C)    TempSrc: Oral Oral    SpO2: 97% 98%    Weight:      Height:       General exam: Appears calm and comfortable severely debilitated state.  Respiratory system: Clear to auscultation. Respiratory effort normal. Cardiovascular system: normal S1 & S2 heard. No JVD, murmurs, rubs, gallops or clicks. No pedal edema. Gastrointestinal system: Abdomen is nondistended, soft and nontender. No organomegaly or masses felt. Normal bowel sounds heard. Central nervous system: Alert and oriented x1 nonverbal  today. Resting and intentional tremors of advanced parkinson's disease  Extremities: severe strength deficits. Skin: No rashes, lesions or ulcers. Psychiatry: Judgement and insight appear diminished. Mood & affect flat.   The results of significant diagnostics from this hospitalization (including imaging, microbiology, ancillary and laboratory) are listed below for reference.     Microbiology: Recent Results (from the past 240 hours)  Resp panel by RT-PCR (RSV, Flu A&B, Covid) Anterior Nasal Swab     Status: None   Collection Time: 09/15/23  9:20 AM   Specimen: Anterior Nasal Swab  Result Value Ref Range Status   SARS Coronavirus 2 by RT PCR NEGATIVE NEGATIVE Final    Comment: (NOTE) SARS-CoV-2 target nucleic acids are NOT DETECTED.  The SARS-CoV-2 RNA is generally detectable in upper respiratory specimens during the acute phase of infection. The lowest concentration of SARS-CoV-2 viral copies this assay can detect is 138 copies/mL. A negative result does not preclude SARS-Cov-2 infection and should not be used as the sole basis for treatment or other patient management decisions. A negative result may occur with  improper specimen collection/handling, submission of specimen other than nasopharyngeal swab, presence of viral mutation(s) within the areas targeted by this assay, and inadequate number of viral copies(<138 copies/mL). A negative result must be combined with clinical observations, patient history, and epidemiological information. The expected result is Negative.  Fact Sheet for Patients:  BloggerCourse.com  Fact Sheet for Healthcare Providers:  SeriousBroker.it  This test is no t yet approved or cleared by the [place]  FDA and  has been authorized for detection and/or diagnosis of SARS-CoV-2 by FDA under an Emergency Use Authorization (EUA). This EUA will remain  in effect (meaning this test can be used) for the  duration of the COVID-19 declaration under Section 564(b)(1) of the Act, 21 U.S.C.section 360bbb-3(b)(1), unless the authorization is terminated  or revoked sooner.       Influenza A by PCR NEGATIVE NEGATIVE Final   Influenza B by PCR NEGATIVE NEGATIVE Final    Comment: (NOTE) The Xpert Xpress SARS-CoV-2/FLU/RSV plus assay is intended as an aid in the diagnosis of influenza from Nasopharyngeal swab specimens and should not be used as a sole basis for treatment. Nasal washings and aspirates are unacceptable for Xpert Xpress SARS-CoV-2/FLU/RSV testing.  Fact Sheet for Patients: BloggerCourse.com  Fact Sheet for Healthcare Providers: SeriousBroker.it  This test is not yet approved or cleared by the [place]  FDA and has been authorized for detection and/or diagnosis of SARS-CoV-2 by FDA under an Emergency Use Authorization (EUA). This EUA will remain in effect (meaning this test can be used) for the duration of the COVID-19 declaration  under Section 564(b)(1) of the Act, 21 U.S.C. section 360bbb-3(b)(1), unless the authorization is terminated or revoked.     Resp Syncytial Virus by PCR NEGATIVE NEGATIVE Final    Comment: (NOTE) Fact Sheet for Patients: BloggerCourse.com  Fact Sheet for Healthcare Providers: SeriousBroker.it  This test is not yet approved or cleared by the [place]  FDA and has been authorized for detection and/or diagnosis of SARS-CoV-2 by FDA under an Emergency Use Authorization (EUA). This EUA will remain in effect (meaning this test can be used) for the duration of the COVID-19 declaration under Section 564(b)(1) of the Act, 21 U.S.C. section 360bbb-3(b)(1), unless the authorization is terminated or revoked.  Performed at [facility], [address] [place], [place] [address]   Urine Culture     Status: Abnormal   Collection Time: 09/15/23 10:32  AM   Specimen: Urine, Clean Catch  Result Value Ref Range Status   Specimen Description   Final    URINE, CLEAN CATCH Performed at [facility], [address], [place], [place] [address]    Special Requests   Final    NONE Performed at [facility], [address] [place], [place] [address]    Culture MULTIPLE SPECIES PRESENT, SUGGEST RECOLLECTION (A)  Final   Report Status 09/17/2023 FINAL  Final  Culture, blood (routine x 2)     Status: None   Collection Time: 09/15/23  2:01 PM   Specimen: BLOOD  Result Value Ref Range Status   Specimen Description BLOOD BLOOD RIGHT ARM  Final   Special Requests   Final    BOTTLES DRAWN AEROBIC AND ANAEROBIC Blood Culture adequate volume   Culture   Final    NO GROWTH 5 DAYS Performed at [facility], [address], [place], [place] [address]    Report Status 09/20/2023 FINAL  Final  Culture, blood (routine x 2)     Status: None   Collection Time: 09/15/23  2:06 PM   Specimen: BLOOD  Result Value Ref Range Status   Specimen Description BLOOD BLOOD LEFT HAND  Final   Special Requests   Final    BOTTLES DRAWN AEROBIC ONLY Blood Culture adequate volume   Culture   Final    NO GROWTH 5 DAYS Performed at [facility], [address], [place], [place] [address]    Report Status 09/20/2023 FINAL  Final     Labs: BNP (last 3 results) No results for input(s): BNP in the last 8760 hours. Basic Metabolic Panel: Recent Labs  Lab 09/15/23 0850 09/15/23 1000 09/15/23 1912 09/16/23 0423 09/17/23 0454 09/18/23 0424  NA 140  --  140 138 137 138  K 2.6*  --  3.7 3.1* 3.5 3.5  CL 103  --  104 104 106 106  CO2 28  --  27 26 25 26   GLUCOSE 95  --  95 127* 154* 127*  BUN 18  --  14 16 22 23   CREATININE 0.83  --  0.79 0.77 0.89 0.83  CALCIUM  8.3*  --  8.0* 8.1* 8.8* 8.6*  MG  --  2.0  --  2.0  --   --    Liver Function Tests: Recent Labs  Lab 09/15/23 0850  AST 15  ALT 10  ALKPHOS 62  BILITOT 1.3*  PROT 6.1*  ALBUMIN 3.2*   No  results for input(s): LIPASE, AMYLASE in the last 168 hours. Recent Labs  Lab 09/15/23 1000  AMMONIA <13   CBC: Recent Labs  Lab 09/15/23  0850 09/16/23 0423  WBC 7.6 4.0  HGB 12.2* 11.5*  HCT 35.7* 34.6*  MCV 94.4 95.1  PLT 263 241   Cardiac Enzymes: No results for input(s): CKTOTAL, CKMB, CKMBINDEX, TROPONINI in the last 168 hours. BNP: Invalid input(s): POCBNP CBG: Recent Labs  Lab 09/17/23 2047 09/18/23 0358 09/18/23 0718 09/18/23 1124 09/18/23 1606  GLUCAP 159* 135* 115* 121* 153*   D-Dimer No results for input(s): DDIMER in the last 72 hours. Hgb A1c No results for input(s): HGBA1C in the last 72 hours. Lipid Profile No results for input(s): CHOL, HDL, LDLCALC, TRIG, CHOLHDL, LDLDIRECT in the last 72 hours. Thyroid  function studies No results for input(s): TSH, T4TOTAL, T3FREE, THYROIDAB in the last 72 hours.  Invalid input(s): FREET3 Anemia work up No results for input(s): VITAMINB12, FOLATE, FERRITIN, TIBC, IRON, RETICCTPCT in the last 72 hours. Urinalysis    Component Value Date/Time   COLORURINE YELLOW 09/15/2023 1032   APPEARANCEUR HAZY (A) 09/15/2023 1032   LABSPEC 1.015 09/15/2023 1032   PHURINE 7.5 09/15/2023 1032   GLUCOSEU 100 (A) 09/15/2023 1032   HGBUR NEGATIVE 09/15/2023 1032   BILIRUBINUR NEGATIVE 09/15/2023 1032   KETONESUR NEGATIVE 09/15/2023 1032   PROTEINUR NEGATIVE 09/15/2023 1032   UROBILINOGEN 0.2 12/15/2012 1201   NITRITE NEGATIVE 09/15/2023 1032   LEUKOCYTESUR NEGATIVE 09/15/2023 1032   Sepsis Labs Recent Labs  Lab 09/15/23 0850 09/16/23 0423  WBC 7.6 4.0   Microbiology Recent Results (from the past 240 hours)  Resp panel by RT-PCR (RSV, Flu A&B, Covid) Anterior Nasal Swab     Status: None   Collection Time: 09/15/23  9:20 AM   Specimen: Anterior Nasal Swab  Result Value Ref Range Status   SARS Coronavirus 2 by RT PCR NEGATIVE NEGATIVE Final    Comment:  (NOTE) SARS-CoV-2 target nucleic acids are NOT DETECTED.  The SARS-CoV-2 RNA is generally detectable in upper respiratory specimens during the acute phase of infection. The lowest concentration of SARS-CoV-2 viral copies this assay can detect is 138 copies/mL. A negative result does not preclude SARS-Cov-2 infection and should not be used as the sole basis for treatment or other patient management decisions. A negative result may occur with  improper specimen collection/handling, submission of specimen other than nasopharyngeal swab, presence of viral mutation(s) within the areas targeted by this assay, and inadequate number of viral copies(<138 copies/mL). A negative result must be combined with clinical observations, patient history, and epidemiological information. The expected result is Negative.  Fact Sheet for Patients:  BloggerCourse.com  Fact Sheet for Healthcare Providers:  SeriousBroker.it  This test is no t yet approved or cleared by the [place]  FDA and  has been authorized for detection and/or diagnosis of SARS-CoV-2 by FDA under an Emergency Use Authorization (EUA). This EUA will remain  in effect (meaning this test can be used) for the duration of the COVID-19 declaration under Section 564(b)(1) of the Act, 21 U.S.C.section 360bbb-3(b)(1), unless the authorization is terminated  or revoked sooner.       Influenza A by PCR NEGATIVE NEGATIVE Final   Influenza B by PCR NEGATIVE NEGATIVE Final    Comment: (NOTE) The Xpert Xpress SARS-CoV-2/FLU/RSV plus assay is intended as an aid in the diagnosis of influenza from Nasopharyngeal swab specimens and should not be used as a sole basis for treatment. Nasal washings and aspirates are unacceptable for Xpert Xpress SARS-CoV-2/FLU/RSV testing.  Fact Sheet for Patients: BloggerCourse.com  Fact Sheet for Healthcare  Providers: SeriousBroker.it  This test is not yet  approved or cleared by the [place]  FDA and has been authorized for detection and/or diagnosis of SARS-CoV-2 by FDA under an Emergency Use Authorization (EUA). This EUA will remain in effect (meaning this test can be used) for the duration of the COVID-19 declaration under Section 564(b)(1) of the Act, 21 U.S.C. section 360bbb-3(b)(1), unless the authorization is terminated or revoked.     Resp Syncytial Virus by PCR NEGATIVE NEGATIVE Final    Comment: (NOTE) Fact Sheet for Patients: BloggerCourse.com  Fact Sheet for Healthcare Providers: SeriousBroker.it  This test is not yet approved or cleared by the [place]  FDA and has been authorized for detection and/or diagnosis of SARS-CoV-2 by FDA under an Emergency Use Authorization (EUA). This EUA will remain in effect (meaning this test can be used) for the duration of the COVID-19 declaration under Section 564(b)(1) of the Act, 21 U.S.C. section 360bbb-3(b)(1), unless the authorization is terminated or revoked.  Performed at [facility], [address], [place], [place] [address]   Urine Culture     Status: Abnormal   Collection Time: 09/15/23 10:32 AM   Specimen: Urine, Clean Catch  Result Value Ref Range Status   Specimen Description   Final    URINE, CLEAN CATCH Performed at [facility], [address] [place], [place] [address]    Special Requests   Final    NONE Performed at [facility], [address] [place], [place] [address]    Culture MULTIPLE SPECIES PRESENT, SUGGEST RECOLLECTION (A)  Final   Report Status 09/17/2023 FINAL  Final  Culture, blood (routine x 2)     Status: None   Collection Time: 09/15/23  2:01 PM   Specimen: BLOOD  Result Value Ref Range Status   Specimen Description BLOOD BLOOD RIGHT ARM  Final   Special Requests   Final    BOTTLES DRAWN AEROBIC AND ANAEROBIC  Blood Culture adequate volume   Culture   Final    NO GROWTH 5 DAYS Performed at [facility], [address] [place], [place] [address]    Report Status 09/20/2023 FINAL  Final  Culture, blood (routine x 2)     Status: None   Collection Time: 09/15/23  2:06 PM   Specimen: BLOOD  Result Value Ref Range Status   Specimen Description BLOOD BLOOD LEFT HAND  Final   Special Requests   Final    BOTTLES DRAWN AEROBIC ONLY Blood Culture adequate volume   Culture   Final    NO GROWTH 5 DAYS Performed at [facility], [address], [place], [place] [address]    Report Status 09/20/2023 FINAL  Final   Time coordinating discharge: 41 mins   SIGNED:  Afton Louder, MD  [facility] 09/20/2023, 10:11 AM How to contact the [facility] Attending or Consulting provider 7A - 7P or covering provider during after hours 7P -7A, for this patient?  Check the care team in [facility] and look for a) attending/consulting TRH provider listed and b) the TRH team listed Log into [email or web address] and use [place] universal password to access. If you do not have the password, please contact the hospital operator. Locate the TRH provider you are looking for under [facility] and page to a number that you can be directly reached. If you still have difficulty reaching the provider, please page the [facility] (Director on Call) for the Hospitalists listed on amion for assistance.

## 2023-09-20 NOTE — TOC Transition Note (Signed)
 Transition of Care [facility]) - Discharge Note   Patient Details  Name: [name] [name] MRN: [id number] Date of Birth: 12-26-1945  [facility] [place] [facility]) CM/SW Contact:  Lucie Lunger, LCSWA Phone Number: 09/20/2023, 10:18 AM   Clinical Narrative:    CSW updated that pts spouse now prefers for return home instead of SNF placement. CSW spoke with pts wife who confirms plans for return home with [facility]. She is interested in [facility] and requested [facility], CSW spoke to rep with [facility] who confirms they can accept referral. MD placed HH orders. Pts spouse also interested in OP palliative referral with [facility], CSW sent referral to [facility] who will follow. CSW added both agencies info to AVS for pt and spouse. TOC signing off.   Final next level of care: Home w Home Health Services Barriers to Discharge: Barriers Resolved   Patient Goals and CMS Choice Patient states their goals for this hospitalization and ongoing recovery are:: return home CMS Medicare.gov Compare Post Acute Care list provided to:: Patient Represenative (must comment) Choice offered to / list presented to : Patient, Spouse [place] ownership interest in [facility].provided to:: Spouse    Discharge Placement                       Discharge Plan and Services Additional resources added to the After Visit Summary for   In-house Referral: Clinical Social Work Discharge Planning Services: CM Consult Post Acute Care Choice: Skilled Nursing Facility                    HH Arranged: RN, Speech Therapy, PT, OT, Nurse's Aide, Social Work [employer] Agency: [facility] ([facility]) Date HH Agency Contacted: 09/20/23      Social Drivers of Health (SDOH) Interventions SDOH Screenings   Food Insecurity: No Food Insecurity (09/15/2023)  Housing: Low Risk  (09/15/2023)  Transportation Needs: No Transportation Needs (09/15/2023)  Utilities: Not At Risk (09/15/2023)  Social Connections: Moderately Isolated  (09/15/2023)  Tobacco Use: Low Risk  (09/15/2023)     Readmission Risk Interventions    09/20/2023   10:17 AM 07/10/2023   12:35 PM  Readmission Risk Prevention Plan  Post Dischage Appt  Complete  Medication Screening  Complete  Transportation Screening Complete Complete  HRI or Home Care Consult Complete   Social Work Consult for Recovery Care Planning/Counseling Complete   Palliative Care Screening Complete   Medication Review Oceanographer) Complete

## 2023-09-22 ENCOUNTER — Other Ambulatory Visit: Payer: Self-pay | Admitting: Radiation Therapy

## 2023-09-22 DIAGNOSIS — D496 Neoplasm of unspecified behavior of brain: Secondary | ICD-10-CM

## 2023-09-23 ENCOUNTER — Other Ambulatory Visit: Payer: Self-pay

## 2023-09-23 ENCOUNTER — Inpatient Hospital Stay (HOSPITAL_COMMUNITY)
Admission: EM | Admit: 2023-09-23 | Discharge: 2023-09-25 | DRG: 100 | Disposition: A | Discharge status: Home Health | Attending: Internal Medicine | Admitting: Internal Medicine

## 2023-09-23 ENCOUNTER — Encounter (HOSPITAL_COMMUNITY): Payer: Self-pay

## 2023-09-23 ENCOUNTER — Emergency Department (HOSPITAL_COMMUNITY)

## 2023-09-23 DIAGNOSIS — Z9842 Cataract extraction status, left eye: Secondary | ICD-10-CM

## 2023-09-23 DIAGNOSIS — R410 Disorientation, unspecified: Secondary | ICD-10-CM | POA: Diagnosis not present

## 2023-09-23 DIAGNOSIS — K219 Gastro-esophageal reflux disease without esophagitis: Secondary | ICD-10-CM | POA: Diagnosis present

## 2023-09-23 DIAGNOSIS — Z743 Need for continuous supervision: Secondary | ICD-10-CM | POA: Diagnosis not present

## 2023-09-23 DIAGNOSIS — R531 Weakness: Principal | ICD-10-CM | POA: Diagnosis present

## 2023-09-23 DIAGNOSIS — G479 Sleep disorder, unspecified: Secondary | ICD-10-CM | POA: Diagnosis present

## 2023-09-23 DIAGNOSIS — Z9841 Cataract extraction status, right eye: Secondary | ICD-10-CM

## 2023-09-23 DIAGNOSIS — R29810 Facial weakness: Secondary | ICD-10-CM | POA: Diagnosis present

## 2023-09-23 DIAGNOSIS — N400 Enlarged prostate without lower urinary tract symptoms: Secondary | ICD-10-CM | POA: Diagnosis present

## 2023-09-23 DIAGNOSIS — R569 Unspecified convulsions: Principal | ICD-10-CM | POA: Diagnosis present

## 2023-09-23 DIAGNOSIS — T380X5A Adverse effect of glucocorticoids and synthetic analogues, initial encounter: Secondary | ICD-10-CM | POA: Diagnosis present

## 2023-09-23 DIAGNOSIS — R200 Anesthesia of skin: Secondary | ICD-10-CM | POA: Diagnosis present

## 2023-09-23 DIAGNOSIS — G8384 Todd's paralysis (postepileptic): Secondary | ICD-10-CM | POA: Diagnosis present

## 2023-09-23 DIAGNOSIS — R29818 Other symptoms and signs involving the nervous system: Secondary | ICD-10-CM

## 2023-09-23 DIAGNOSIS — Z7952 Long term (current) use of systemic steroids: Secondary | ICD-10-CM

## 2023-09-23 DIAGNOSIS — D496 Neoplasm of unspecified behavior of brain: Secondary | ICD-10-CM

## 2023-09-23 DIAGNOSIS — I951 Orthostatic hypotension: Secondary | ICD-10-CM | POA: Diagnosis present

## 2023-09-23 DIAGNOSIS — Z888 Allergy status to other drugs, medicaments and biological substances status: Secondary | ICD-10-CM

## 2023-09-23 DIAGNOSIS — Z8249 Family history of ischemic heart disease and other diseases of the circulatory system: Secondary | ICD-10-CM

## 2023-09-23 DIAGNOSIS — G936 Cerebral edema: Secondary | ICD-10-CM | POA: Diagnosis not present

## 2023-09-23 DIAGNOSIS — R6889 Other general symptoms and signs: Secondary | ICD-10-CM | POA: Diagnosis not present

## 2023-09-23 DIAGNOSIS — Z974 Presence of external hearing-aid: Secondary | ICD-10-CM

## 2023-09-23 DIAGNOSIS — Z79899 Other long term (current) drug therapy: Secondary | ICD-10-CM

## 2023-09-23 DIAGNOSIS — R4781 Slurred speech: Secondary | ICD-10-CM | POA: Diagnosis not present

## 2023-09-23 DIAGNOSIS — G20A1 Parkinson's disease without dyskinesia, without mention of fluctuations: Secondary | ICD-10-CM | POA: Diagnosis present

## 2023-09-23 DIAGNOSIS — G9389 Other specified disorders of brain: Secondary | ICD-10-CM

## 2023-09-23 LAB — CBC
HCT: 38.7 % — ABNORMAL LOW (ref 39.0–52.0)
Hemoglobin: 12.9 g/dL — ABNORMAL LOW (ref 13.0–17.0)
MCH: 31.8 pg (ref 26.0–34.0)
MCHC: 33.3 g/dL (ref 30.0–36.0)
MCV: 95.3 fL (ref 80.0–100.0)
Platelets: 406 K/uL — ABNORMAL HIGH (ref 150–400)
RBC: 4.06 MIL/uL — ABNORMAL LOW (ref 4.22–5.81)
RDW: 12.6 % (ref 11.5–15.5)
WBC: 14.1 K/uL — ABNORMAL HIGH (ref 4.0–10.5)
nRBC: 0 % (ref 0.0–0.2)

## 2023-09-23 LAB — COMPREHENSIVE METABOLIC PANEL WITH GFR
ALT: 10 U/L (ref 0–44)
AST: 21 U/L (ref 15–41)
Albumin: 2.9 g/dL — ABNORMAL LOW (ref 3.5–5.0)
Alkaline Phosphatase: 58 U/L (ref 38–126)
Anion gap: 12 (ref 5–15)
BUN: 39 mg/dL — ABNORMAL HIGH (ref 8–23)
CO2: 23 mmol/L (ref 22–32)
Calcium: 8.3 mg/dL — ABNORMAL LOW (ref 8.9–10.3)
Chloride: 103 mmol/L (ref 98–111)
Creatinine, Ser: 1.07 mg/dL (ref 0.61–1.24)
GFR, Estimated: >60 mL/min (ref >60)
Glucose, Bld: 109 mg/dL — ABNORMAL HIGH (ref 70–99)
Potassium: 4.3 mmol/L (ref 3.5–5.1)
Sodium: 138 mmol/L (ref 135–145)
Total Bilirubin: 0.8 mg/dL (ref 0.0–1.2)
Total Protein: 5.6 g/dL — ABNORMAL LOW (ref 6.5–8.1)

## 2023-09-23 LAB — PROTIME-INR
INR: 1 (ref 0.8–1.2)
Prothrombin Time: 13.7 s (ref 11.4–15.2)

## 2023-09-23 LAB — ETHANOL: Alcohol, Ethyl (B): <15 mg/dL (ref <15)

## 2023-09-23 LAB — RAPID URINE DRUG SCREEN, HOSP PERFORMED
Amphetamines: NOT DETECTED
Barbiturates: NOT DETECTED
Benzodiazepines: NOT DETECTED
Cocaine: NOT DETECTED
Opiates: NOT DETECTED
Tetrahydrocannabinol: NOT DETECTED

## 2023-09-23 LAB — DIFFERENTIAL
Abs Immature Granulocytes: 0.4 K/uL — ABNORMAL HIGH (ref 0.00–0.07)
Basophils Absolute: 0 K/uL (ref 0.0–0.1)
Basophils Relative: 0 %
Eosinophils Absolute: 0 K/uL (ref 0.0–0.5)
Eosinophils Relative: 0 %
Lymphocytes Relative: 5 %
Lymphs Abs: 0.7 K/uL (ref 0.7–4.0)
Monocytes Absolute: 1 K/uL (ref 0.1–1.0)
Monocytes Relative: 7 %
Myelocytes: 3 %
Neutro Abs: 12 K/uL — ABNORMAL HIGH (ref 1.7–7.7)
Neutrophils Relative %: 85 %
Smear Review: NORMAL

## 2023-09-23 LAB — CBG MONITORING, ED: Glucose-Capillary: 128 mg/dL — ABNORMAL HIGH (ref 70–99)

## 2023-09-23 LAB — APTT: aPTT: 23 s — ABNORMAL LOW (ref 24–36)

## 2023-09-23 MED ORDER — POLYETHYLENE GLYCOL 3350 17 G PO PACK: 17.0000 g | PACK | Freq: Every day | ORAL | Status: DC | PRN

## 2023-09-23 MED ORDER — PANTOPRAZOLE SODIUM 40 MG PO TBEC
40.0000 mg | DELAYED_RELEASE_TABLET | Freq: Every day | ORAL | Status: DC
  Administered 2023-09-24 – 2023-09-25 (×2): 40 mg via ORAL
  Filled 2023-09-23 (×2): qty 1

## 2023-09-23 MED ORDER — CARBIDOPA-LEVODOPA 25-100 MG PO TABS
2.0000 | ORAL_TABLET | Freq: Four times a day (QID) | ORAL | Status: DC
  Administered 2023-09-24 – 2023-09-25 (×6): 2 via ORAL
  Filled 2023-09-23 (×6): qty 2

## 2023-09-23 MED ORDER — LEVETIRACETAM (KEPPRA) 500 MG/5 ML ADULT IV PUSH
3000.0000 mg | INTRAVENOUS | Status: AC
  Administered 2023-09-23: 3000 mg via INTRAVENOUS
  Filled 2023-09-23: qty 30

## 2023-09-23 MED ORDER — LEVETIRACETAM (KEPPRA) 500 MG/5 ML ADULT IV PUSH
500.0000 mg | Freq: Two times a day (BID) | INTRAVENOUS | Status: DC
  Administered 2023-09-24 – 2023-09-25 (×3): 500 mg via INTRAVENOUS
  Filled 2023-09-23 (×3): qty 5

## 2023-09-23 MED ORDER — CARBIDOPA-LEVODOPA ER 50-200 MG PO TBCR
1.0000 | EXTENDED_RELEASE_TABLET | Freq: Every day | ORAL | Status: DC
  Administered 2023-09-24 (×2): 1 via ORAL
  Filled 2023-09-23 (×3): qty 1

## 2023-09-23 MED ORDER — ONDANSETRON HCL 4 MG PO TABS: 4.0000 mg | ORAL_TABLET | Freq: Four times a day (QID) | ORAL | Status: DC | PRN

## 2023-09-23 MED ORDER — ONDANSETRON HCL 4 MG/2ML IJ SOLN: 4.0000 mg | Freq: Four times a day (QID) | INTRAMUSCULAR | Status: DC | PRN

## 2023-09-23 MED ORDER — ACETAMINOPHEN 325 MG PO TABS: 650.0000 mg | ORAL_TABLET | Freq: Four times a day (QID) | ORAL | Status: DC | PRN

## 2023-09-23 MED ORDER — PANTOPRAZOLE SODIUM 40 MG IV SOLR: 40.0000 mg | INTRAVENOUS | Status: DC

## 2023-09-23 MED ORDER — DEXAMETHASONE 4 MG PO TABS
4.0000 mg | ORAL_TABLET | Freq: Four times a day (QID) | ORAL | Status: DC
  Administered 2023-09-23 – 2023-09-24 (×3): 4 mg via ORAL
  Filled 2023-09-23 (×3): qty 1

## 2023-09-23 MED ORDER — ACETAMINOPHEN 650 MG RE SUPP
650.0000 mg | Freq: Four times a day (QID) | RECTAL | Status: DC | PRN
Start: 2023-09-23 — End: 2023-09-25

## 2023-09-23 NOTE — ED Notes (Signed)
 [facility] called for transport.

## 2023-09-23 NOTE — Assessment & Plan Note (Signed)
 Recent diagnosis recent hospitalization 8/13 to 8/18.  Started on outpatient Decadron  and has established care with neurosurgery 8/20.  I do not see notes in epic.  Per neurology note, neurosurgery tumor board discussion planned for Monday.

## 2023-09-23 NOTE — H&P (Signed)
 History and Physical    [name] [name] FMW:[id number] DOB: 10-Mar-1945 DOA: 09/23/2023  PCP: Beverley Louann ONEIDA, MD   Patient coming from: Home  I have personally briefly reviewed patient's old medical records in [facility]  Chief Complaint: Right sided weakness, slurred speech, right facial droop  HPI: [name] [name] is a 78 y.o. male with medical history significant for brain mass, Parkinson's. Patient was brought to the ED with reports of right-sided weakness, slurred speech and right facial droop.  Most of the symptoms have significantly improved. Patient was going to lunch with his family at a restaurant, at about 12 noon, family noticed speech, right facial droop, and weakness to his right side.  Per family, patient was looking to the left, patient son was sitting to his right talking to him and trying to feed him, patient was not responding to him.  He was also confused, with confused speech, and attempting to eat vegetables with his hand.  With persistence of symptoms on arrival home, he presented to the ED.  Patient was just hospitalized 8/13 to 8/18-for dysarthria, confusion.  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  He was started on Decadron  for cerebral edema and to follow-up as outpatient with neurosurgery 8/20.  As he was deconditioned, SNF was recommended, but spouse declined.  ED Course: Temperature 98.6.  Heart rate 70-83.  Respiratory rate 13-22.  Blood pressure systolic 118-148.  O2 sats 96% room air. Head CT- Persistent region of abnormal edema within the left cerebral hemisphere as described on the recent prior head CT of 09/15/2023 and better characterized on the recent prior brain MRI of 09/15/2023. Teleneurologist Dr. Voncile was consulted-collect episode -likely seizure in the setting of left-sided cerebral edema from brain mass.  Admit to [facility] preference in case LTM EEG is required, steroid dose increased to 4 mg every 6 hourly, loaded with  Keppra  3 g, continue 500 twice daily.  Review of Systems: As per HPI all other systems reviewed and negative.  Past Medical History:  Diagnosis Date   Agent orange exposure    Anemia    BPH (benign prostatic hypertrophy)    Cancer (HCC)    basal cell temple   Constipation    Depression    Fatigue    d/t Parkinson's   GERD (gastroesophageal reflux disease)    occasional   History of kidney stones    Left ureteral calculus    Lower urinary tract symptoms (LUTS)    Nephrolithiasis    Neuromuscular disorder (HCC)     parkinson's   Orthostatic hypotension    d/t Parkinson's disease   Orthostatic hypotension    Parkinson's disease (HCC)    Peyronie disease    Pneumonia 2018   Renal cyst, left    S/P deep brain stimulator placement    11-29-2012   Sleep behavior disorder, REM    Wears glasses    Wears hearing aid    BILATERAL-- INTERMITTANT WEARS    Past Surgical History:  Procedure Laterality Date   CATARACT EXTRACTION Bilateral    CHOLECYSTECTOMY  12/21/2014   Procedure: LAPAROSCOPIC CHOLECYSTECTOMY;  Surgeon: Lynda Leos, MD;  Location: [facility];  Service: General;;   COLONOSCOPY     CYSTO/  LEFT URETEROSOCPY/   LEFT URETERAL STENT PLACEMENT/  LASER BLADDER STONES AND EXTRACTION  09/25/2010   CYSTOSCOPY WITH INSERTION OF UROLIFT N/A 03/11/2017   Procedure: CYSTOSCOPY WITH INSERTION OF UROLIFT, LITHOPAXY,STONE OBTAINED;  Surgeon: Matilda Senior, MD;  Location:  [facility];  Service: Urology;  Laterality: N/A;   CYSTOSCOPY WITH LITHOLAPAXY N/A 09/12/2020   Procedure: CYSTOSCOPY WITH LITHOLAPAXY, LEFT RETROGRADE PYELOGRAMS, LEFT URETEROSCOPY, AND STENT PLACEMENT;  Surgeon: Cam Morene ORN, MD;  Location: [facility];  Service: Urology;  Laterality: N/A;  REQUESTING 2 HRS   CYSTOSCOPY WITH LITHOLAPAXY Right 07/14/2023   Procedure: CYSTOSCOPY, WITH BLADDER CALCULUS LITHOLAPAXY;  Surgeon: Cam Morene ORN, MD;  Location: [facility];   Service: Urology;  Laterality: Right;  CYSTOLITHOLAPAXY, RIGHT URETEROSCOPY, LASER LITHOTRIPSY, STONE EXTRACTION, RIGHT URETERAL STENT PLACEMENT   CYSTOSCOPY WITH RETROGRADE PYELOGRAM, URETEROSCOPY AND STENT PLACEMENT Left 01/06/2013   Procedure: CYSTOSCOPY WITH RETROGRADE PYELOGRAM, URETEROSCOPY AND STENT PLACEMENT;  Surgeon: Thomasine Oiler, MD;  Location: [facility];  Service: Urology;  Laterality: Left;   CYSTOSCOPY WITH RETROGRADE PYELOGRAM, URETEROSCOPY AND STENT PLACEMENT Left 09/11/2014   Procedure: CYSTOSCOPY WITH LEFT RETROGRADE PYELOGRAM, URETEROSCOPY AND STENT PLACEMENT;  Surgeon: Oliva Oiler, MD;  Location: [facility];  Service: Urology;  Laterality: Left;   CYSTOSCOPY/RETROGRADE/URETEROSCOPY/STONE EXTRACTION WITH BASKET Right 07/14/2023   Procedure: CYSTOSCOPY, WITH CALCULUS REMOVAL USING BASKET;  Surgeon: Cam Morene ORN, MD;  Location: [facility];  Service: Urology;  Laterality: Right;   CYSTOSCOPY/URETEROSCOPY/HOLMIUM LASER/STENT PLACEMENT Right 07/14/2023   Procedure: CYSTOSCOPY/URETEROSCOPY/HOLMIUM LASER/STENT PLACEMENT;  Surgeon: Cam Morene ORN, MD;  Location: [facility];  Service: Urology;  Laterality: Right;   DEEP BRAIN STIMULATOR PLACEMENT  11/29/2012   genertor device at left pectoral area-   EXTRACORPOREAL SHOCK WAVE LITHOTRIPSY Left 10-11-2010  &  01-05-2011   HOLMIUM LASER APPLICATION Left 01/06/2013   Procedure: HOLMIUM LASER APPLICATION;  Surgeon: Thomasine Oiler, MD;  Location: [facility];  Service: Urology;  Laterality: Left;   HOLMIUM LASER APPLICATION Left 09/11/2014   Procedure: HOLMIUM LASER APPLICATION;  Surgeon: Oliva Oiler, MD;  Location: [facility];  Service: Urology;  Laterality: Left;   HOLMIUM LASER APPLICATION N/A 03/11/2017   Procedure: HOLMIUM LASER APPLICATION;  Surgeon: Matilda Senior, MD;  Location: [facility];  Service: Urology;  Laterality: N/A;   NEGATIVE SLEEP STUDY   2013  per pt   NEPHROLITHOTOMY Right 08/13/2022   Procedure: RIGHT NEPHROLITHOTOMY PERCUTANEOUS;  Surgeon: Cam Morene ORN, MD;  Location: [facility];  Service: Urology;  Laterality: Right;  210 MINUTES   SP PERC NEPHROSTOMY Left 12/16/2012   STONE EXTRACTION WITH BASKET Left 01/06/2013   Procedure: STONE EXTRACTION WITH BASKET;  Surgeon: Thomasine Oiler, MD;  Location: [facility];  Service: Urology;  Laterality: Left;   TONSILLECTOMY     TRANSURETHRAL RESECTION OF PROSTATE N/A 09/12/2020   Procedure: TRANSURETHRAL RESECTION OF THE PROSTATE (TURP);  Surgeon: Cam Morene ORN, MD;  Location: [facility];  Service: Urology;  Laterality: N/A;     reports that he has never smoked. He has never used smokeless tobacco. He reports that he does not currently use alcohol . He reports that he does not use drugs.  Allergies  Allergen Reactions   Alfuzosin  Other (See Comments)    Hypotension    Rivastigmine Tartrate Nausea And Vomiting     Can tolerate the patch but can not tolerate the pills   Tamsulosin Other (See Comments)    Severe joint pain    Family History  Problem Relation Age of Onset   Hypertension Mother    Dementia Father    Cancer Father    Heart attack Sister    Arthritis/Rheumatoid Sister    Prior  to Admission medications   Medication Sig Start Date End Date Taking? Authorizing Provider  carbidopa -levodopa  (SINEMET  CR) 50-200 MG tablet Take 1 tablet by mouth at bedtime.   Yes [provider]  carbidopa -levodopa  (SINEMET  IR) 25-100 MG tablet 2-2.5 tablets 4 times per day at 6am/10:30am/2pm/6pm Patient taking differently: Take 2 tablets by mouth 4 (four) times daily. 2 tablets 4 times per day at 6am/10:30am/2pm/6pm 09/20/23  Yes Johnson, Clanford L, MD  cyanocobalamin  1000 MCG tablet Take 1 tablet (1,000 mcg total) by mouth daily. Patient taking differently: Take 1,000 mcg by mouth every other day. 07/11/23  Yes Rizwan, Saima, MD   dexamethasone  (DECADRON ) 4 MG tablet Take 1 tablet (4 mg total) by mouth every 12 (twelve) hours for 14 days. 09/20/23 10/04/23 Yes Johnson, Clanford L, MD  fludrocortisone  (FLORINEF ) 0.1 MG tablet Take 2 tablets (0.2 mg total) by mouth 3 (three) times daily with meals. Patient taking differently: Take 0.2 mg by mouth 3 (three) times daily with meals. Hold for increased blood pressure 07/10/23  Yes Rizwan, Saima, MD  Levodopa  (INBRIJA ) 42 MG CAPS We are going to start inbrija .  Remember that TWO capsules is ONE dosage (never inhale just one capsule).  You can inhale the capsules as needed up to 5 times per day, separated by 2 hour intervals. Patient taking differently: Place 1 capsule into inhaler and inhale as needed (can't walk, muscle rigidedness). We are going to start inbrija .  Remember that TWO capsules is ONE dosage (never inhale just one capsule).  You can inhale the capsules as needed up to 5 times per day, separated by 2 hour intervals. 08/30/23  Yes Tat, Asberry RAMAN, DO  midodrine  (PROAMATINE ) 5 MG tablet Take 1 tablet (5 mg total) by mouth 3 (three) times daily with meals. 08/30/23  Yes Tat, Asberry RAMAN, DO  potassium chloride  SA (KLOR-CON  M) 20 MEQ tablet Take 20 mEq by mouth daily.    [provider]    Physical Exam: Vitals:   09/23/23 1730 09/23/23 1800 09/23/23 1830 09/23/23 1900  BP: (!) 144/84 133/74 (!) 143/83 135/77  Pulse: 70     Resp: 17 (!) 22 17 16   Temp:      TempSrc:      SpO2: 96%     Weight:      Height:        Constitutional: Eyes are closed but following directions, calm, comfortable Vitals:   09/23/23 1730 09/23/23 1800 09/23/23 1830 09/23/23 1900  BP: (!) 144/84 133/74 (!) 143/83 135/77  Pulse: 70     Resp: 17 (!) 22 17 16   Temp:      TempSrc:      SpO2: 96%     Weight:      Height:       Eyes: PERRL, lids and conjunctivae normal ENMT: Mucous membranes are moist. Neck: normal, supple, no masses, no thyromegaly Respiratory: clear to auscultation  bilaterally, no wheezing, no crackles. Normal respiratory effort. No accessory muscle use.  Cardiovascular: Regular rate and rhythm, no murmurs / rubs / gallops. No extremity edema.   Abdomen: no tenderness, no masses palpated. No hepatosplenomegaly. Bowel sounds positive.  Musculoskeletal: no clubbing / cyanosis. No joint deformity upper and lower extremities.  Skin: no rashes, lesions, ulcers. No induration Neurologic: Following some directions, good and equal grip strength bilateral upper extremities, 4+/5 strength to bilateral lower extremities, mild facial droop to right nasolabial fold.  Minimal speech, awake but not talking. Psychiatric: Normal judgment and insight. Alert and  oriented x 3. Normal mood.   Labs on Admission: I have personally reviewed following labs and imaging studies  CBC: Recent Labs  Lab 09/23/23 1714  WBC 14.1*  NEUTROABS 12.0*  HGB 12.9*  HCT 38.7*  MCV 95.3  PLT 406*   Basic Metabolic Panel: Recent Labs  Lab 09/17/23 0454 09/18/23 0424 09/23/23 1714  NA 137 138 138  K 3.5 3.5 4.3  CL 106 106 103  CO2 25 26 23   GLUCOSE 154* 127* 109*  BUN 22 23 39*  CREATININE 0.89 0.83 1.07  CALCIUM  8.8* 8.6* 8.3*   GFR: Estimated Creatinine Clearance: 56.2 mL/min (by C-G formula based on SCr of 1.07 mg/dL). Liver Function Tests: Recent Labs  Lab 09/23/23 1714  AST 21  ALT 10  ALKPHOS 58  BILITOT 0.8  PROT 5.6*  ALBUMIN 2.9*   Coagulation Profile: Recent Labs  Lab 09/23/23 1714  INR 1.0   CBG: Recent Labs  Lab 09/18/23 0358 09/18/23 0718 09/18/23 1124 09/18/23 1606 09/23/23 1649  GLUCAP 135* 115* 121* 153* 128*   Urine analysis:    Component Value Date/Time   COLORURINE YELLOW 09/15/2023 1032   APPEARANCEUR HAZY (A) 09/15/2023 1032   LABSPEC 1.015 09/15/2023 1032   PHURINE 7.5 09/15/2023 1032   GLUCOSEU 100 (A) 09/15/2023 1032   HGBUR NEGATIVE 09/15/2023 1032   BILIRUBINUR NEGATIVE 09/15/2023 1032   KETONESUR NEGATIVE 09/15/2023  1032   PROTEINUR NEGATIVE 09/15/2023 1032   UROBILINOGEN 0.2 12/15/2012 1201   NITRITE NEGATIVE 09/15/2023 1032   LEUKOCYTESUR NEGATIVE 09/15/2023 1032    Radiological Exams on Admission: CT HEAD CODE STROKE WO CONTRAST Result Date: 09/23/2023 CLINICAL DATA:  Code stroke. Neuro deficit, acute, stroke suspected. Right-sided weakness. Slurred speech. EXAM: CT HEAD WITHOUT CONTRAST TECHNIQUE: Contiguous axial images were obtained from the base of the skull through the vertex without intravenous contrast. RADIATION DOSE REDUCTION: This exam was performed according to the departmental dose-optimization program which includes automated exposure control, adjustment of the mA and/or kV according to patient size and/or use of iterative reconstruction technique. COMPARISON:  Brain MRI 09/15/2023.  Head CT 09/15/2023. FINDINGS: Brain: Generalized cerebral atrophy. Deep brain stimulator leads terminating in the region of the subthalamic nuclei bilaterally. Persistent region of abnormal edema within the left cerebral hemisphere as described on the prior head CT of 09/15/2023 and better characterized on the brain MRI of 09/15/2023. Background mild patchy and ill-defined hypoattenuation within the cerebral white matter, nonspecific but compatible chronic small vessel disease. There is no acute intracranial hemorrhage. No acute demarcated cortical infarct. No extra-axial fluid collection. No midline shift. Vascular: No hyperdense vessel. Skull: No acute calvarial fracture. Bilateral frontoparietal burr holes with traversing deep brain stimulator leads. Sinuses/Orbits: No orbital mass or acute orbital finding. No significant paranasal sinus disease at the imaged levels. ASPECTS (Alberta Stroke Program Early CT Score) - Ganglionic level infarction (caudate, lentiform nuclei, internal capsule, insula, M1-M3 cortex): 7 - Supraganglionic infarction (M4-M6 cortex): 3 Total score (0-10 with 10 being normal): 10 No evidence of  interval acute intracranially as compared to the prior brain MRI of 09/15/2023. These results were communicated to Dr. Voncile at 5:22 pmon 8/21/2025by text page via the [facility] messaging system. IMPRESSION: 1. Persistent region of abnormal edema within the left cerebral hemisphere as described on the recent prior head CT of 09/15/2023 and better characterized on the recent prior brain MRI of 09/15/2023. Differential considerations include primary CNS neoplasm, lymphoma, an inflammatory process and recent demyelination, among others. 2. No evidence of  an interval acute intracranial abnormality. 3. Background parenchymal atrophy and chronic small ischemic disease. Electronically Signed   By: Rockey Childs D.O.   On: 09/23/2023 17:22   EKG: Independently reviewed.  Sinus rhythm, rate 66, QTc 456.  No significant change from prior.  Assessment/Plan Principal Problem:   Right sided weakness Active Problems:   Parkinson's disease (HCC)   Brain mass  Assessment and Plan: * Right sided weakness Presenting with right-sided weakness, right facial droop, slurred speech, gazing to the left. Head CT- Persistent region of abnormal edema within the left cerebral hemisphere as described on the recent prior head CT of 09/15/2023 and better characterized on the recent prior brain MRI of 09/15/2023. -Evaluated by teleneurologist admit to [facility] preference in case LTM EEG is required, steroid dose increased to 4 mg every 6 hourly, loaded with Keppra  3 g, continue 500 twice daily. - No indication to repeat MRI if exam does not worsen. - Seizure precautions - Routine EEG - Monitor glucose while on steroids - Oral Protonix  40 daily   Brain mass Recent diagnosis recent hospitalization 8/13 to 8/18.  Started on outpatient Decadron  and has established care with neurosurgery 8/20.  I do not see notes in epic.  Per neurology note, neurosurgery tumor board discussion planned for Monday.   Parkinson's disease  (HCC) Resume Sinemet , levodopa   History of orthostatic hypotension - Midodrine  and Florinef  held.  Blood pressures up to 140s.  DVT prophylaxis: SCDS for now with presence of brain mass. Code Status: FULL- Confirmed with patient and son at bedside. Family Communication: Patient and son at bedside. Disposition Plan: ~ 2 days Consults called: Neurology Admission status:  Inpt Tele I certify that at the point of admission it is my clinical judgment that the patient will require inpatient hospital care spanning beyond 2 midnights from the point of admission due to high intensity of service, high risk for further deterioration and high frequency of surveillance required.   Author: Tully FORBES Carwin, MD 09/23/2023 10:21 PM  For on call review [email or web address].

## 2023-09-23 NOTE — ED Triage Notes (Signed)
 Pt arrived pov with c/o of R side weakness and slurred speech LkW 1233 but Sx has progressively gotten better since getting here per family, pt was recently Dx with a brian tumor. Pt also has a Hx of parkinson and family states that his speech. They were told by neurologist that tumor will affect his speech and will affect his R side.

## 2023-09-23 NOTE — Assessment & Plan Note (Signed)
 Resume Sinemet , levodopa 

## 2023-09-23 NOTE — Assessment & Plan Note (Addendum)
 Presenting with right-sided weakness, right facial droop, slurred speech, gazing to the left. Head CT- Persistent region of abnormal edema within the left cerebral hemisphere as described on the recent prior head CT of 09/15/2023 and better characterized on the recent prior brain MRI of 09/15/2023. -Evaluated by teleneurologist admit to [facility] preference in case LTM EEG is required, steroid dose increased to 4 mg every 6 hourly, loaded with Keppra  3 g, continue 500 twice daily. - No indication to repeat MRI if exam does not worsen. - Seizure precautions - Routine EEG - Monitor glucose while on steroids - Oral Protonix  40 daily

## 2023-09-23 NOTE — ED Provider Notes (Signed)
 [place] EMERGENCY DEPARTMENT AT [facility] Provider Note   CSN: [id number] Arrival date & time: 09/23/23  1639     Patient presents with: Weakness   [name] [name] is a 78 y.o. male.  {Add pertinent medical, surgical, social history, OB history to YEP:67052} Patient with a history of a brain tumor.  He also has Parkinson's disease.  He had an episode where he had slurred speech and facial droop with right-sided weakness today.  This happened once before when he was admitted recently.   Weakness      Prior to Admission medications   Medication Sig Start Date End Date Taking? Authorizing Provider  carbidopa -levodopa  (SINEMET  CR) 50-200 MG tablet Take 1 tablet by mouth at bedtime.   Yes [provider]  carbidopa -levodopa  (SINEMET  IR) 25-100 MG tablet 2-2.5 tablets 4 times per day at 6am/10:30am/2pm/6pm Patient taking differently: Take 2 tablets by mouth 4 (four) times daily. 2 tablets 4 times per day at 6am/10:30am/2pm/6pm 09/20/23  Yes Johnson, Clanford L, MD  cyanocobalamin  1000 MCG tablet Take 1 tablet (1,000 mcg total) by mouth daily. Patient taking differently: Take 1,000 mcg by mouth every other day. 07/11/23  Yes Rizwan, Saima, MD  dexamethasone  (DECADRON ) 4 MG tablet Take 1 tablet (4 mg total) by mouth every 12 (twelve) hours for 14 days. 09/20/23 10/04/23 Yes Johnson, Clanford L, MD  fludrocortisone  (FLORINEF ) 0.1 MG tablet Take 2 tablets (0.2 mg total) by mouth 3 (three) times daily with meals. Patient taking differently: Take 0.2 mg by mouth 3 (three) times daily with meals. Hold for increased blood pressure 07/10/23  Yes Rizwan, Saima, MD  Levodopa  (INBRIJA ) 42 MG CAPS We are going to start inbrija .  Remember that TWO capsules is ONE dosage (never inhale just one capsule).  You can inhale the capsules as needed up to 5 times per day, separated by 2 hour intervals. Patient taking differently: Place 1 capsule into inhaler and inhale as needed (can't walk,  muscle rigidedness). We are going to start inbrija .  Remember that TWO capsules is ONE dosage (never inhale just one capsule).  You can inhale the capsules as needed up to 5 times per day, separated by 2 hour intervals. 08/30/23  Yes Tat, Asberry RAMAN, DO  midodrine  (PROAMATINE ) 5 MG tablet Take 1 tablet (5 mg total) by mouth 3 (three) times daily with meals. 08/30/23  Yes Tat, Asberry RAMAN, DO  potassium chloride  SA (KLOR-CON  M) 20 MEQ tablet Take 20 mEq by mouth daily.    [provider]    Allergies: Alfuzosin , Rivastigmine tartrate, and Tamsulosin    Review of Systems  Neurological:  Positive for weakness.    Updated Vital Signs BP 135/77   Pulse 70   Temp 98.6 F (37 C) (Temporal)   Resp 16   Ht 5' 10 (1.778 m)   Wt 71 kg   SpO2 96%   BMI 22.46 kg/m   Physical Exam  (all labs ordered are listed, but only abnormal results are displayed) Labs Reviewed  APTT - Abnormal; Notable for the following components:      Result Value   aPTT 23 (*)    All other components within normal limits  CBC - Abnormal; Notable for the following components:   WBC 14.1 (*)    RBC 4.06 (*)    Hemoglobin 12.9 (*)    HCT 38.7 (*)    Platelets 406 (*)    All other components within normal limits  DIFFERENTIAL - Abnormal; Notable  for the following components:   Neutro Abs 12.0 (*)    Abs Immature Granulocytes 0.40 (*)    All other components within normal limits  COMPREHENSIVE METABOLIC PANEL WITH GFR - Abnormal; Notable for the following components:   Glucose, Bld 109 (*)    BUN 39 (*)    Calcium  8.3 (*)    Total Protein 5.6 (*)    Albumin 2.9 (*)    All other components within normal limits  CBG MONITORING, ED - Abnormal; Notable for the following components:   Glucose-Capillary 128 (*)    All other components within normal limits  ETHANOL  PROTIME-INR  RAPID URINE DRUG SCREEN, HOSP PERFORMED  I-STAT CHEM 8, ED    EKG: None  Radiology: CT HEAD CODE STROKE WO CONTRAST Result  Date: 09/23/2023 CLINICAL DATA:  Code stroke. Neuro deficit, acute, stroke suspected. Right-sided weakness. Slurred speech. EXAM: CT HEAD WITHOUT CONTRAST TECHNIQUE: Contiguous axial images were obtained from the base of the skull through the vertex without intravenous contrast. RADIATION DOSE REDUCTION: This exam was performed according to the departmental dose-optimization program which includes automated exposure control, adjustment of the mA and/or kV according to patient size and/or use of iterative reconstruction technique. COMPARISON:  Brain MRI 09/15/2023.  Head CT 09/15/2023. FINDINGS: Brain: Generalized cerebral atrophy. Deep brain stimulator leads terminating in the region of the subthalamic nuclei bilaterally. Persistent region of abnormal edema within the left cerebral hemisphere as described on the prior head CT of 09/15/2023 and better characterized on the brain MRI of 09/15/2023. Background mild patchy and ill-defined hypoattenuation within the cerebral white matter, nonspecific but compatible chronic small vessel disease. There is no acute intracranial hemorrhage. No acute demarcated cortical infarct. No extra-axial fluid collection. No midline shift. Vascular: No hyperdense vessel. Skull: No acute calvarial fracture. Bilateral frontoparietal burr holes with traversing deep brain stimulator leads. Sinuses/Orbits: No orbital mass or acute orbital finding. No significant paranasal sinus disease at the imaged levels. ASPECTS (Alberta Stroke Program Early CT Score) - Ganglionic level infarction (caudate, lentiform nuclei, internal capsule, insula, M1-M3 cortex): 7 - Supraganglionic infarction (M4-M6 cortex): 3 Total score (0-10 with 10 being normal): 10 No evidence of interval acute intracranially as compared to the prior brain MRI of 09/15/2023. These results were communicated to Dr. Voncile at 5:22 pmon 8/21/2025by text page via the [facility] messaging system. IMPRESSION: 1. Persistent region of abnormal  edema within the left cerebral hemisphere as described on the recent prior head CT of 09/15/2023 and better characterized on the recent prior brain MRI of 09/15/2023. Differential considerations include primary CNS neoplasm, lymphoma, an inflammatory process and recent demyelination, among others. 2. No evidence of an interval acute intracranial abnormality. 3. Background parenchymal atrophy and chronic small ischemic disease. Electronically Signed   By: Rockey Childs D.O.   On: 09/23/2023 17:22    {Document cardiac monitor, telemetry assessment procedure when appropriate:32947} Procedures   Medications Ordered in the ED  dexamethasone  (DECADRON ) tablet 4 mg (4 mg Oral Given 09/23/23 1815)  levETIRAcetam  (KEPPRA ) undiluted injection 3,000 mg (3,000 mg Intravenous Given 09/23/23 1759)   CRITICAL CARE Performed by: Fairy Sermon Total critical care time: 45 minutes Critical care time was exclusive of separately billable procedures and treating other patients. Critical care was necessary to treat or prevent imminent or life-threatening deterioration. Critical care was time spent personally by me on the following activities: development of treatment plan with patient and/or surrogate as well as nursing, discussions with consultants, evaluation of patient's response to treatment,  examination of patient, obtaining history from patient or surrogate, ordering and performing treatments and interventions, ordering and review of laboratory studies, ordering and review of radiographic studies, pulse oximetry and re-evaluation of patient's condition.   Patient was seen by neurology Dr. Deedra and he felt like this could be seizure related.  He is recommending admission to the hospitalist at [facility] and neurology consult {Click here for ABCD2, HEART and other calculators REFRESH Note before signing:1}                              Medical Decision Making Amount and/or Complexity of Data  Reviewed Labs: ordered. Radiology: ordered.  Risk Decision regarding hospitalization.   Right sided weakness and slurred speech possibly related to seizure from his brain tumor  {Document critical care time when appropriate  Document review of labs and clinical decision tools ie CHADS2VASC2, etc  Document your independent review of radiology images and any outside records  Document your discussion with family members, caretakers and with consultants  Document social determinants of health affecting pt's care  Document your decision making why or why not admission, treatments were needed:32947:::1}   Final diagnoses:  Right sided weakness    ED Discharge Orders     None

## 2023-09-23 NOTE — Consult Note (Addendum)
 [facility] Telemedicine Consult   Requesting Provider: Dr. Suzette Consult Participants: Dr. RONAL Lav, bedside RN, Katie Location of the provider: Home Location of the patient: [name] [name], ER bed 10  This consult was provided via telemedicine with 2-way video and audio communication. The patient/family was informed that care would be provided in this way and agreed to receive care in this manner.   Chief Complaint: Code stroke-right-sided facial droop, right-sided weakness, speech difficulty  HPI: 78 year old man with past history of Parkinson's status post DBS patient of Dr. 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 According to family, they were going for about the lunch and he was fine at 12 PM.  Upon reaching the restaurant, they noted that he is having trouble with his words and the right side of his face was drooping.  He drools some from his mouth because of his Parkinson's.  He was drooling much more when the son was trying to feed him.  His right arm and leg also appeared to be weaker than usual. He was brought in for further evaluation because he was recently also diagnosed with a left brain mass for which he has seen neurosurgery and the case is due to be discussed in tumor board on Monday. He is on Decadron  4 mg twice daily. No witnessed seizure activity but the family reports that his speech is now much clearer and near to baseline but it was completely incomprehensible and he almost became speech less at that time along with right sided weakness which has since improved.    Past Medical History:  Diagnosis Date   Agent orange exposure    Anemia    BPH (benign prostatic hypertrophy)    Cancer (HCC)    basal cell temple   Constipation    Depression    Fatigue    d/t  Parkinson's   GERD (gastroesophageal reflux disease)    occasional   History of kidney stones    Left ureteral calculus    Lower urinary tract symptoms (LUTS)    Nephrolithiasis    Neuromuscular disorder (HCC)     parkinson's   Orthostatic hypotension    d/t Parkinson's disease   Orthostatic hypotension    Parkinson's disease (HCC)    Peyronie disease    Pneumonia 2018   Renal cyst, left    S/P deep brain stimulator placement    11-29-2012   Sleep behavior disorder, REM    Wears glasses    Wears hearing aid    BILATERAL-- INTERMITTANT WEARS    No current facility-administered medications for this encounter.  Current Outpatient Medications:    carbidopa -levodopa  (SINEMET  IR) 25-100 MG tablet, 2-2.5 tablets 4 times per day at 6am/10:30am/2pm/6pm, Disp: , Rfl:    cyanocobalamin  1000 MCG tablet, Take 1 tablet (1,000 mcg total) by mouth daily., Disp: 30 tablet, Rfl: 0   dexamethasone  (DECADRON ) 4 MG tablet, Take 1 tablet (4 mg total) by mouth every 12 (twelve) hours for 14 days., Disp: 28 tablet, Rfl: 0   fludrocortisone  (FLORINEF ) 0.1 MG tablet, Take 2 tablets (0.2 mg total) by mouth 3 (three) times daily with meals., Disp: 180 tablet, Rfl: 0   Levodopa  (INBRIJA ) 42 MG CAPS, We are going to start inbrija .  Remember that TWO capsules is ONE dosage (never inhale just one capsule).  You can inhale the capsules as needed up to  5 times per day, separated by 2 hour intervals., Disp: , Rfl:    midodrine  (PROAMATINE ) 5 MG tablet, Take 1 tablet (5 mg total) by mouth 3 (three) times daily with meals., Disp: 270 tablet, Rfl: 1    LKW: 12 PM IV thrombolysis given?: No, outside the window, also less likely a stroke and more likely seizure or electrographic abnormality related to brain mass IR Thrombectomy? No, stroke less likely, exam not suggestive of LVO at this time, more suggestive of the above Modified Rankin Scale: 4-Needs assistance to walk and tend to bodily needs Time of  teleneurologist evaluation: 1707  Exam: Vitals:   09/23/23 1652  BP: 118/73  Pulse: 83  Resp: 18  Temp: 98.6 F (37 C)  SpO2: 96%    General: In no distress Neurological exam Awake alert oriented x 3.  Could tell me his date of birth, name, but could not tell me the age correctly.  Speech is hypophonic with mild dysarthria but according to family that is baseline.  No evidence of aphasia.  Was able to name simple objects on the camera such as glasses, thumb and watch.  Cranial nerve examination was remarkable for a very subtle right-sided facial droop but other than that was intact.  Motor examination revealed no drift in any of the 4 extremities.  There was resting tremor and action tremor noted within constraints of the camera examination.  Sensation was intact   NIHSS 1A: Level of Consciousness - 0 1B: Ask Month and Age - 1 1C: 'Blink Eyes' & 'Squeeze Hands' - 0 2: Test Horizontal Extraocular Movements - 0 3: Test Visual Fields - 0 4: Test Facial Palsy - 1 5A: Test Left Arm Motor Drift - 0 5B: Test Right Arm Motor Drift - 0 6A: Test Left Leg Motor Drift - 0 6B: Test Right Leg Motor Drift - 0 7: Test Limb Ataxia - 0 8: Test Sensation - 0 9: Test Language/Aphasia- 0 10: Test Dysarthria - 1 11: Test Extinction/Inattention - 0 NIHSS score: 3   Imaging Reviewed: CT head without contrast revealed persistent region of abnormal edema within the left cerebral hemisphere as had been seen in the prior head CT from 09/15/2023 and better seen on the MRI of the brain from 09/15/2023 with differential considerations including primary CNS neoplasm, lymphoma and inflammatory process.  Labs reviewed in epic and pertinent values follow: CBC    Component Value Date/Time   WBC 4.0 09/16/2023 0423   RBC 3.64 (L) 09/16/2023 0423   HGB 11.5 (L) 09/16/2023 0423   HCT 34.6 (L) 09/16/2023 0423   PLT 241 09/16/2023 0423   MCV 95.1 09/16/2023 0423   MCH 31.6 09/16/2023 0423   MCHC 33.2  09/16/2023 0423   RDW 12.1 09/16/2023 0423   LYMPHSABS 1.4 07/06/2023 0516   MONOABS 0.5 07/06/2023 0516   EOSABS 0.1 07/06/2023 0516   BASOSABS 0.0 07/06/2023 0516   CMP     Component Value Date/Time   NA 138 09/18/2023 0424   K 3.5 09/18/2023 0424   CL 106 09/18/2023 0424   CO2 26 09/18/2023 0424   GLUCOSE 127 (H) 09/18/2023 0424   BUN 23 09/18/2023 0424   CREATININE 0.83 09/18/2023 0424   CALCIUM  8.6 (L) 09/18/2023 0424   PROT 6.1 (L) 09/15/2023 0850   ALBUMIN 3.2 (L) 09/15/2023 0850   AST 15 09/15/2023 0850   ALT 10 09/15/2023 0850   ALKPHOS 62 09/15/2023 0850   BILITOT 1.3 (H) 09/15/2023 0850  GFRNONAA >60 09/18/2023 0424   GFRAA >60 12/12/2014 1056     Assessment:  78 year old with Parkinson's status post DBS orthostatic hypotension autonomic instability REM sleep disorder and recent diagnosis of a left brain mass presented for evaluation of sudden onset of inability to talk, right facial droop, right-sided weakness which has since nearly completely resolved. Given the recent diagnosis of the brain mass and location in the left cerebral hemisphere, more likely scenario is a seizure or seizure-like electrographic abnormality in the setting of the cerebral edema rather than a stroke. He is outside the window for IV thrombolysis-last known well was around noon and he arrived at the ER about 4:39 PM.  Suspicion for stroke is low.  Exam did not point towards an LVO hence vessel imaging emergently was not performed. I discussed with the family that I would increase his steroids, and antiepileptic and do an EEG.  I gave them a choice of either being transferred to [facility] in case he has another episode and requires LTM EEG versus monitoring here and then transfer if need be.  They prefer being at [facility].  Impression: Strokelike episode-likely electrographic abnormality akin to seizure in the setting of left-sided cerebral edema from the brain  mass  Recommendations:  Increase Decadron  dose to 4 mg every 6 hours. Loaded with Keppra  3 g IV x 1 Start Keppra  500 twice daily from tomorrow. Maintain seizure precautions Routine EEG Family prefers observation admission to [facility] in case of requirement for LTM EEG and would also like an in person neurology evaluation. He has establish care with neurosurgery-tumor board discussion planned for Monday.  Will need follow-up as outpatient as previously planned. Do not see a need for repeat MRI if the exam does not worsen This plan was relayed to Dr. Zammit in the ED. [facility]y team is also aware.  -- Eligio Lav, MD Neurologist [facility] Pager: [phone]

## 2023-09-24 ENCOUNTER — Observation Stay (HOSPITAL_COMMUNITY)

## 2023-09-24 ENCOUNTER — Inpatient Hospital Stay (HOSPITAL_COMMUNITY)

## 2023-09-24 DIAGNOSIS — D496 Neoplasm of unspecified behavior of brain: Secondary | ICD-10-CM | POA: Diagnosis not present

## 2023-09-24 DIAGNOSIS — R200 Anesthesia of skin: Secondary | ICD-10-CM | POA: Diagnosis present

## 2023-09-24 DIAGNOSIS — Z8249 Family history of ischemic heart disease and other diseases of the circulatory system: Secondary | ICD-10-CM | POA: Diagnosis not present

## 2023-09-24 DIAGNOSIS — R29810 Facial weakness: Secondary | ICD-10-CM | POA: Diagnosis present

## 2023-09-24 DIAGNOSIS — Z79899 Other long term (current) drug therapy: Secondary | ICD-10-CM | POA: Diagnosis not present

## 2023-09-24 DIAGNOSIS — R410 Disorientation, unspecified: Secondary | ICD-10-CM

## 2023-09-24 DIAGNOSIS — I951 Orthostatic hypotension: Secondary | ICD-10-CM | POA: Diagnosis present

## 2023-09-24 DIAGNOSIS — G8384 Todd's paralysis (postepileptic): Secondary | ICD-10-CM | POA: Diagnosis present

## 2023-09-24 DIAGNOSIS — Z7952 Long term (current) use of systemic steroids: Secondary | ICD-10-CM | POA: Diagnosis not present

## 2023-09-24 DIAGNOSIS — K219 Gastro-esophageal reflux disease without esophagitis: Secondary | ICD-10-CM | POA: Diagnosis present

## 2023-09-24 DIAGNOSIS — Z974 Presence of external hearing-aid: Secondary | ICD-10-CM | POA: Diagnosis not present

## 2023-09-24 DIAGNOSIS — R531 Weakness: Secondary | ICD-10-CM | POA: Diagnosis present

## 2023-09-24 DIAGNOSIS — N400 Enlarged prostate without lower urinary tract symptoms: Secondary | ICD-10-CM | POA: Diagnosis present

## 2023-09-24 DIAGNOSIS — G936 Cerebral edema: Secondary | ICD-10-CM | POA: Diagnosis present

## 2023-09-24 DIAGNOSIS — G9389 Other specified disorders of brain: Secondary | ICD-10-CM | POA: Diagnosis not present

## 2023-09-24 DIAGNOSIS — Z9842 Cataract extraction status, left eye: Secondary | ICD-10-CM | POA: Diagnosis not present

## 2023-09-24 DIAGNOSIS — G20A1 Parkinson's disease without dyskinesia, without mention of fluctuations: Secondary | ICD-10-CM | POA: Diagnosis present

## 2023-09-24 DIAGNOSIS — G479 Sleep disorder, unspecified: Secondary | ICD-10-CM | POA: Diagnosis present

## 2023-09-24 DIAGNOSIS — R569 Unspecified convulsions: Secondary | ICD-10-CM | POA: Diagnosis present

## 2023-09-24 DIAGNOSIS — T380X5A Adverse effect of glucocorticoids and synthetic analogues, initial encounter: Secondary | ICD-10-CM | POA: Diagnosis present

## 2023-09-24 DIAGNOSIS — Z9841 Cataract extraction status, right eye: Secondary | ICD-10-CM | POA: Diagnosis not present

## 2023-09-24 DIAGNOSIS — Z888 Allergy status to other drugs, medicaments and biological substances status: Secondary | ICD-10-CM | POA: Diagnosis not present

## 2023-09-24 LAB — GLUCOSE, CAPILLARY: Glucose-Capillary: 129 mg/dL — ABNORMAL HIGH (ref 70–99)

## 2023-09-24 MED ORDER — ALUM & MAG HYDROXIDE-SIMETH 200-200-20 MG/5ML PO SUSP: 15.0000 mL | ORAL | Status: DC | PRN

## 2023-09-24 MED ORDER — MELATONIN 3 MG PO TABS
3.0000 mg | ORAL_TABLET | Freq: Every evening | ORAL | Status: DC | PRN
  Administered 2023-09-24: 3 mg via ORAL
  Filled 2023-09-24: qty 1

## 2023-09-24 MED ORDER — DEXAMETHASONE 4 MG PO TABS
4.0000 mg | ORAL_TABLET | Freq: Two times a day (BID) | ORAL | Status: DC
  Administered 2023-09-24 – 2023-09-25 (×2): 4 mg via ORAL
  Filled 2023-09-24 (×2): qty 1

## 2023-09-24 NOTE — Progress Notes (Signed)
 EEG complete - results pending

## 2023-09-24 NOTE — Procedures (Signed)
 Patient Name: JALAL RAUCH  MRN: [id number]  Epilepsy Attending: Arlin MALVA Krebs  Referring Physician/Provider: Pearlean Tully BRAVO, MD  Date: 09/24/2023 Duration: 23.20 mins  Patient history: 78yo M with new onset seizures. EEG to evaluate for seizure.  Level of alertness: Awake, drowsy  AEDs during EEG study: LEV  Technical aspects: This EEG study was done with scalp electrodes positioned according to the 10-20 International system of electrode placement. Electrical activity was reviewed with band pass filter of 1-70Hz , sensitivity of 7 uV/mm, display speed of 1mm/sec with a 60Hz  notched filter applied as appropriate. EEG data were recorded continuously and digitally stored.  Video monitoring was available and reviewed as appropriate.  Description: The posterior dominant rhythm consists of 7Hz  activity of moderate voltage (25-35 uV) seen predominantly in posterior head regions, symmetric and reactive to eye opening and eye closing. Drowsiness was characterized by attenuation of the posterior background rhythm. EEG showed continuous generalized 3 to 7 Hz theta-delta slowing. Hyperventilation and photic stimulation were not performed.     ABNORMALITY - Continuous slow, generalized  IMPRESSION: This study is suggestive of moderate diffuse encephalopathy. No seizures or epileptiform discharges were seen throughout the recording.  Carolanne Mercier O Prescious Hurless

## 2023-09-24 NOTE — Progress Notes (Signed)
 NEUROLOGY CONSULT FOLLOW UP NOTE   Date of service: September 24, 2023 Patient Name: [name] [name] MRN:  [id number] DOB:  [birth year]  Interval Hx/subjective   Son is at the bedside.  Patient is laying in bed in no apparent distress.  Son states that he did have some hallucinations in the middle of the night.  Son states that he has had some improvement since yesterday  Vitals   Vitals:   09/23/23 2337 09/24/23 0150 09/24/23 0416 09/24/23 0759  BP:  130/79 127/67 (!) 146/77  Pulse:  61    Resp:  15    Temp:   97.9 F (36.6 C) (!) 97.4 F (36.3 C)  TempSrc:   Axillary Oral  SpO2: 96% 95%    Weight:      Height:         Body mass index is 22.46 kg/m.  Physical Exam   Constitutional: Elderly male in no apparent distress Psych: Affect appropriate to situation.  Eyes: No scleral injection.  HENT: No OP obstrucion.  Head: Normocephalic.  Cardiovascular: Normal rate and regular rhythm.  Respiratory: Effort normal, non-labored breathing.  GI: Soft.  No distension. There is no tenderness.  Skin: WDI.   Neurologic Examination   Mental Status -  He is awake and alert, he is oriented to self, hospital, month.  He stated his age as 46, and year is 2056.  Voice is very hypophonic (baseline), dysarthric able to follow commands, able to name objects and repeat  Cranial Nerves II - XII - II - Visual field intact OU . III, IV, VI - Extraocular movements intact . V -decreased on right VII -right facial VIII - Hearing & vestibular intact bilaterally . X - Palate elevates symmetrically . XI - Chin turning & shoulder shrug intact bilaterally . XII - Tongue protrusion intact .  Motor Strength - The patient's strength was normal in all extremities and pronator drift was absent.  Bulk was normal and fasciculations were absent .   Motor Tone - Muscle tone was assessed at the neck and appendages and was normal . Sensory - Light touch, temperature/pinprick were assessed and were  symmetrical.   Coordination - The patient had normal movements in the hands and feet with no ataxia or dysmetria.  Tremor was absent. Gait and Station - deferred.  Medications  Current Facility-Administered Medications:    acetaminophen  (TYLENOL ) tablet 650 mg, 650 mg, Oral, Q6H PRN **OR** acetaminophen  (TYLENOL ) suppository 650 mg, 650 mg, Rectal, Q6H PRN, Emokpae, Ejiroghene E, MD   alum & mag hydroxide-simeth (MAALOX/MYLANTA) 200-200-20 MG/5ML suspension 15 mL, 15 mL, Oral, Q4H PRN, Elgergawy, Dawood S, MD   carbidopa -levodopa  (SINEMET  CR) 50-200 MG per tablet controlled release 1 tablet, 1 tablet, Oral, QHS, Emokpae, Ejiroghene E, MD, 1 tablet at 09/24/23 0029   carbidopa -levodopa  (SINEMET  IR) 25-100 MG per tablet immediate release 2 tablet, 2 tablet, Oral, QID, Emokpae, Ejiroghene E, MD, 2 tablet at 09/24/23 0947   dexamethasone  (DECADRON ) tablet 4 mg, 4 mg, Oral, Q6H, Arora, Ashish, MD, 4 mg at 09/24/23 0508   levETIRAcetam  (KEPPRA ) undiluted injection 500 mg, 500 mg, Intravenous, Q12H, Emokpae, Ejiroghene E, MD, 500 mg at 09/24/23 0947   ondansetron  (ZOFRAN ) tablet 4 mg, 4 mg, Oral, Q6H PRN **OR** ondansetron  (ZOFRAN ) injection 4 mg, 4 mg, Intravenous, Q6H PRN, Emokpae, Ejiroghene E, MD   pantoprazole  (PROTONIX ) EC tablet 40 mg, 40 mg, Oral, Daily, Emokpae, Ejiroghene E, MD, 40 mg at 09/24/23 0947   polyethylene glycol (MIRALAX  /  GLYCOLAX ) packet 17 g, 17 g, Oral, Daily PRN, Emokpae, Ejiroghene E, MD  Labs and Diagnostic Imaging   CBC:  Recent Labs  Lab 09/23/23 1714  WBC 14.1*  NEUTROABS 12.0*  HGB 12.9*  HCT 38.7*  MCV 95.3  PLT 406*    Basic Metabolic Panel:  Lab Results  Component Value Date   NA 138 09/23/2023   K 4.3 09/23/2023   CO2 23 09/23/2023   GLUCOSE 109 (H) 09/23/2023   BUN 39 (H) 09/23/2023   CREATININE 1.07 09/23/2023   CALCIUM  8.3 (L) 09/23/2023   GFRNONAA >60 09/23/2023   GFRAA >60 12/12/2014   Lipid Panel: No results found for: LDLCALC HgbA1c:   Lab Results  Component Value Date   HGBA1C 5.0 09/17/2023   Urine Drug Screen:     Component Value Date/Time   LABOPIA NONE DETECTED 09/23/2023 1730   COCAINSCRNUR NONE DETECTED 09/23/2023 1730   LABBENZ NONE DETECTED 09/23/2023 1730   AMPHETMU NONE DETECTED 09/23/2023 1730   THCU NONE DETECTED 09/23/2023 1730   LABBARB NONE DETECTED 09/23/2023 1730    Alcohol  Level     Component Value Date/Time   ETH <15 09/23/2023 1714   INR  Lab Results  Component Value Date   INR 1.0 09/23/2023   APTT  Lab Results  Component Value Date   APTT 23 (L) 09/23/2023   AED levels: No results found for: PHENYTOIN, ZONISAMIDE, LAMOTRIGINE, LEVETIRACETA  CT Head without contrast(Personally reviewed): 1. Persistent region of abnormal edema within the left cerebral hemisphere as described on the recent prior head CT of 09/15/2023 and better characterized on the recent prior brain MRI of 09/15/2023. Differential considerations include primary CNS neoplasm, lymphoma, an inflammatory process and recent demyelination, among others. 2. No evidence of an interval acute intracranial abnormality. 3. Background parenchymal atrophy and chronic small ischemic disease.  rEEG:  Ordered  Assessment   [name] [name] is a 78 y.o. male Parkinson's status post DBS orthostatic hypotension autonomic instability REM sleep disorder and recent diagnosis of a left brain mass presented for evaluation of sudden onset of inability to talk, right facial droop, right-sided weakness which has since nearly completely resolved. Given the recent diagnosis of the brain mass and location in the left cerebral hemisphere, more likely scenario is a seizure or seizure-like electrographic abnormality in the setting of the cerebral edema rather than a stroke. He was loaded with 3 g of IV Keppra  and his Decadron  dose was increased to 4 mg every 6 hours Recommendations  Seizure precautions Routine EEG is ordered Continue PPI  while on steroids  Monitor blood glucose while on steroids  Continue Keppra  500 mg twice daily  ______________________________________________________________________   [name] Karna DELENA Waddell, NP [facility]  He had mildly confused overnight, my suspicion is that this may be related to his steroids, brain tumor, etc. I doubt that this was a breakthrough seizure.  He does complain of some right hand numbness which he states is new.  Given the confusion, I would favor decreasing his steroid dose, and continuing Keppra .  He will need follow-up as an outpatient with neuro-oncology and neurosurgery.  Neurology will continue to be available on an as needed basis, please call with further questions or concerns.  [name] Seals, MD [facility]   If 7pm- 7am, please page neurology on call as listed in AMION.

## 2023-09-24 NOTE — Progress Notes (Signed)
 PROGRESS NOTE    [name] [name]  FMW:[id number] DOB: [birth year] DOA: 09/23/2023 PCP: Beverley Louann ONEIDA, MD    Chief Complaint  Patient presents with   Weakness    Brief Narrative:   [name] [name] is a 78 y.o. male with medical history significant for brain mass, Parkinson's. Patient was brought to the ED with reports of right-sided weakness, slurred speech and right facial droop.  Most of the symptoms have significantly improved. Patient was going to lunch with his family at a restaurant, at about 12 noon, family noticed speech, right facial droop, and weakness to his right side.  Per family, patient was looking to the left, patient son was sitting to his right talking to him and trying to feed him, patient was not responding to him.  He was also confused, with confused speech, and attempting to eat vegetables with his hand.  With persistence of symptoms on arrival home, he presented to the ED.   Patient was just hospitalized 8/13 to 8/18-for dysarthria, confusion.  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  He was started on Decadron  for cerebral edema and to follow-up as outpatient with neurosurgery 8/20.  As he was deconditioned, SNF was recommended, but spouse declined.   ED Course: Temperature 98.6.  Heart rate 70-83.  Respiratory rate 13-22.  Blood pressure systolic 118-148.  O2 sats 96% room air. Head CT- Persistent region of abnormal edema within the left cerebral hemisphere as described on the recent prior head CT of 09/15/2023 and better characterized on the recent prior brain MRI of 09/15/2023. Teleneurologist Dr. Voncile was consulted-collect episode -likely seizure in the setting of left-sided cerebral edema from brain mass.  Admit to [employer] preference in case LTM EEG is required, steroid dose increased to 4 mg every 6 hourly, loaded with Keppra  3 g, continue 500 twice daily.     Assessment & Plan:   Principal Problem:   Right sided weakness Active  Problems:   Parkinson's disease (HCC)   Brain mass   New onset seizures - Is most likely in the setting of recent diagnosis of brain mass with surrounding edema. - Managment per neurology, loaded with IV Keppra , continue with Keppra  500 mg twice daily - Continue with seizure precautions.   Right sided weakness -Has resolved, this is most likey due to [place], paralysis -Plan to repeat CT head given worsening numbness to rule out any intracranial hemorrhage - Consult PT, OT     Brain mass - Recent diagnosis recent hospitalization 8/13 to 8/18. - Patient was seen by neurosurgery Dr. Lanis yesterday . - To see Dr. [name] on Tuesday, message has been sent to her neuro-oncology regarding patient admissions . - Continue with Decadron , thank discussed with neurology, will crease back to home dose of 4 mg twice daily    Parkinson's disease (HCC) Resume Sinemet , levodopa    History of orthostatic hypotension - Midodrine  and Florinef  held.  Blood pressures up to 140s.   Leuckocytocis - This is secondary to steroids and seizures  DVT prophylaxis: (SCD's) Code Status: (Full) Family Communication: (D/W son at bedside) Disposition:      Consultants:  neurology   Subjective:  Patient with some confusion overnight, but this morning as discussed with son patient back to baseline, no recurrent episodes of seizures, no urinary or stool incontinence  Objective: Vitals:   09/23/23 2337 09/24/23 0150 09/24/23 0416 09/24/23 0759  BP:  130/79 127/67 (!) 146/77  Pulse:  61    Resp:  15    Temp:   97.9 F (36.6 C) (!) 97.4 F (36.3 C)  TempSrc:   Axillary Oral  SpO2: 96% 95%    Weight:      Height:        Intake/Output Summary (Last 24 hours) at 09/24/2023 1146 Last data filed at 09/24/2023 0415 Gross per 24 hour  Intake --  Output 600 ml  Net -600 ml   Filed Weights   09/23/23 1701  Weight: 71 kg    Examination:  Awake Alert, severely deconditioned, CTAB RRR,No  Gallops,Rubs or new Murmurs, No Parasternal Heave +ve B.Sounds, Abd Soft No Cyanosis, Clubbing or edema, No new Rash or bruise      Data Reviewed: I have personally reviewed following labs and imaging studies  CBC: Recent Labs  Lab 09/23/23 1714  WBC 14.1*  NEUTROABS 12.0*  HGB 12.9*  HCT 38.7*  MCV 95.3  PLT 406*    Basic Metabolic Panel: Recent Labs  Lab 09/18/23 0424 09/23/23 1714  NA 138 138  K 3.5 4.3  CL 106 103  CO2 26 23  GLUCOSE 127* 109*  BUN 23 39*  CREATININE 0.83 1.07  CALCIUM  8.6* 8.3*    GFR: Estimated Creatinine Clearance: 56.2 mL/min (by C-G formula based on SCr of 1.07 mg/dL).  Liver Function Tests: Recent Labs  Lab 09/23/23 1714  AST 21  ALT 10  ALKPHOS 58  BILITOT 0.8  PROT 5.6*  ALBUMIN 2.9*    CBG: Recent Labs  Lab 09/18/23 0718 09/18/23 1124 09/18/23 1606 09/23/23 1649 09/24/23 0038  GLUCAP 115* 121* 153* 128* 129*     Recent Results (from the past 240 hours)  Resp panel by RT-PCR (RSV, Flu A&B, Covid) Anterior Nasal Swab     Status: None   Collection Time: 09/15/23  9:20 AM   Specimen: Anterior Nasal Swab  Result Value Ref Range Status   SARS Coronavirus 2 by RT PCR NEGATIVE NEGATIVE Final    Comment: (NOTE) SARS-CoV-2 target nucleic acids are NOT DETECTED.  The SARS-CoV-2 RNA is generally detectable in upper respiratory specimens during the acute phase of infection. The lowest concentration of SARS-CoV-2 viral copies this assay can detect is 138 copies/mL. A negative result does not preclude SARS-Cov-2 infection and should not be used as the sole basis for treatment or other patient management decisions. A negative result may occur with  improper specimen collection/handling, submission of specimen other than nasopharyngeal swab, presence of viral mutation(s) within the areas targeted by this assay, and inadequate number of viral copies(<138 copies/mL). A negative result must be combined with clinical  observations, patient history, and epidemiological information. The expected result is Negative.  Fact Sheet for Patients:  BloggerCourse.com  Fact Sheet for Healthcare Providers:  SeriousBroker.it  This test is no t yet approved or cleared by the [place]  FDA and  has been authorized for detection and/or diagnosis of SARS-CoV-2 by FDA under an Emergency Use Authorization (EUA). This EUA will remain  in effect (meaning this test can be used) for the duration of the COVID-19 declaration under Section 564(b)(1) of the Act, 21 U.S.C.section 360bbb-3(b)(1), unless the authorization is terminated  or revoked sooner.       Influenza A by PCR NEGATIVE NEGATIVE Final   Influenza B by PCR NEGATIVE NEGATIVE Final    Comment: (NOTE) The Xpert Xpress SARS-CoV-2/FLU/RSV plus assay is intended as an aid in the diagnosis of influenza from Nasopharyngeal swab specimens and should not be used as a sole  basis for treatment. Nasal washings and aspirates are unacceptable for Xpert Xpress SARS-CoV-2/FLU/RSV testing.  Fact Sheet for Patients: BloggerCourse.com  Fact Sheet for Healthcare Providers: SeriousBroker.it  This test is not yet approved or cleared by the [place]  FDA and has been authorized for detection and/or diagnosis of SARS-CoV-2 by FDA under an Emergency Use Authorization (EUA). This EUA will remain in effect (meaning this test can be used) for the duration of the COVID-19 declaration under Section 564(b)(1) of the Act, 21 U.S.C. section 360bbb-3(b)(1), unless the authorization is terminated or revoked.     Resp Syncytial Virus by PCR NEGATIVE NEGATIVE Final    Comment: (NOTE) Fact Sheet for Patients: BloggerCourse.com  Fact Sheet for Healthcare Providers: SeriousBroker.it  This test is not yet approved or cleared by  the [place]  FDA and has been authorized for detection and/or diagnosis of SARS-CoV-2 by FDA under an Emergency Use Authorization (EUA). This EUA will remain in effect (meaning this test can be used) for the duration of the COVID-19 declaration under Section 564(b)(1) of the Act, 21 U.S.C. section 360bbb-3(b)(1), unless the authorization is terminated or revoked.  Performed at [facility], [address], [place], [place] [address]   Urine Culture     Status: Abnormal   Collection Time: 09/15/23 10:32 AM   Specimen: Urine, Clean Catch  Result Value Ref Range Status   Specimen Description   Final    URINE, CLEAN CATCH Performed at [facility], [address], [place], [place] [address]    Special Requests   Final    NONE Performed at [facility], [address] [place], [place] [address]    Culture MULTIPLE SPECIES PRESENT, SUGGEST RECOLLECTION (A)  Final   Report Status 09/17/2023 FINAL  Final  Culture, blood (routine x 2)     Status: None   Collection Time: 09/15/23  2:01 PM   Specimen: BLOOD  Result Value Ref Range Status   Specimen Description BLOOD BLOOD RIGHT ARM  Final   Special Requests   Final    BOTTLES DRAWN AEROBIC AND ANAEROBIC Blood Culture adequate volume   Culture   Final    NO GROWTH 5 DAYS Performed at [facility], [address] [place], [place] [address]    Report Status 09/20/2023 FINAL  Final  Culture, blood (routine x 2)     Status: None   Collection Time: 09/15/23  2:06 PM   Specimen: BLOOD  Result Value Ref Range Status   Specimen Description BLOOD BLOOD LEFT HAND  Final   Special Requests   Final    BOTTLES DRAWN AEROBIC ONLY Blood Culture adequate volume   Culture   Final    NO GROWTH 5 DAYS Performed at [facility], [address] [place], [place] [address]    Report Status 09/20/2023 FINAL  Final         Radiology Studies: CT HEAD CODE STROKE WO CONTRAST Result Date: 09/23/2023 CLINICAL DATA:  Code stroke. Neuro deficit,  acute, stroke suspected. Right-sided weakness. Slurred speech. EXAM: CT HEAD WITHOUT CONTRAST TECHNIQUE: Contiguous axial images were obtained from the base of the skull through the vertex without intravenous contrast. RADIATION DOSE REDUCTION: This exam was performed according to the departmental dose-optimization program which includes automated exposure control, adjustment of the mA and/or kV according to patient size and/or use of iterative reconstruction technique. COMPARISON:  Brain MRI 09/15/2023.  Head CT 09/15/2023. FINDINGS: Brain: Generalized cerebral atrophy. Deep brain stimulator leads terminating in the region of the subthalamic  nuclei bilaterally. Persistent region of abnormal edema within the left cerebral hemisphere as described on the prior head CT of 09/15/2023 and better characterized on the brain MRI of 09/15/2023. Background mild patchy and ill-defined hypoattenuation within the cerebral white matter, nonspecific but compatible chronic small vessel disease. There is no acute intracranial hemorrhage. No acute demarcated cortical infarct. No extra-axial fluid collection. No midline shift. Vascular: No hyperdense vessel. Skull: No acute calvarial fracture. Bilateral frontoparietal burr holes with traversing deep brain stimulator leads. Sinuses/Orbits: No orbital mass or acute orbital finding. No significant paranasal sinus disease at the imaged levels. ASPECTS (Alberta Stroke Program Early CT Score) - Ganglionic level infarction (caudate, lentiform nuclei, internal capsule, insula, M1-M3 cortex): 7 - Supraganglionic infarction (M4-M6 cortex): 3 Total score (0-10 with 10 being normal): 10 No evidence of interval acute intracranially as compared to the prior brain MRI of 09/15/2023. These results were communicated to Dr. Voncile at 5:22 pmon 8/21/2025by text page via the [facility] messaging system. IMPRESSION: 1. Persistent region of abnormal edema within the left cerebral hemisphere as described on the  recent prior head CT of 09/15/2023 and better characterized on the recent prior brain MRI of 09/15/2023. Differential considerations include primary CNS neoplasm, lymphoma, an inflammatory process and recent demyelination, among others. 2. No evidence of an interval acute intracranial abnormality. 3. Background parenchymal atrophy and chronic small ischemic disease. Electronically Signed   By: Rockey Childs D.O.   On: 09/23/2023 17:22        Scheduled Meds:  carbidopa -levodopa   1 tablet Oral QHS   carbidopa -levodopa   2 tablet Oral QID   dexamethasone   4 mg Oral Q6H   levETIRAcetam   500 mg Intravenous Q12H   pantoprazole   40 mg Oral Daily   Continuous Infusions:   LOS: 0 days       Brayton Lye, MD [facility]   To contact the attending provider between 7A-7P or the covering provider during after hours 7P-7A, please log into the web site [email or web address] and access using universal [place] password for that web site. If you do not have the password, please call the hospital operator.  09/24/2023, 11:46 AM

## 2023-09-24 NOTE — Evaluation (Addendum)
 Physical Therapy Evaluation Patient Details Name: [name] [name] MRN: [id number] DOB: 1945-05-12 Today's Date: 09/24/2023  History of Present Illness  [name] [name] is a 78 y.o. male who presented with right-sided weakness, slurred speech and right facial droop. Concern for seizure.   Recent admission 8/13 to 8/18-for dysarthria, confusion.   PMHx: brain mass, Parkinson's.  Clinical Impression  Patient presents with decreased mobility due to generalized weakness, decreased balance, and decreased activity tolerance.  Previously per wife pt able to get up and walk to breakfast table with walker and some support then at times had to return to bed via wheelchair.  States he is at times more weak on the R side though it comes and goes.  Patient today able to stand pivot to a chair at the bedside for linen change then pivot with steps back to bed with mod A overall and reporting weakness/fatigue not wanting to walk this pm.  Patient was getting HHPT until recently though wife feels it will help to get started again despite to undergo treatment planning for brain tumor.  PT will continue to follow in the acute setting, recommend HHPT at d/c.          If plan is discharge home, recommend the following: A lot of help with bathing/dressing/bathroom;A lot of help with walking and/or transfers   Can travel by private vehicle        Equipment Recommendations Other (comment) (consider tub bench for garden tub)  Recommendations for Other Services       Functional Status Assessment Patient has had a recent decline in their functional status and demonstrates the ability to make significant improvements in function in a reasonable and predictable amount of time.     Precautions / Restrictions Precautions Precautions: Fall Recall of Precautions/Restrictions: Impaired Precaution/Restrictions Comments: seizure      Mobility  Bed Mobility Overal bed mobility: Needs Assistance Bed Mobility: Supine to  Sit, Sit to Supine     Supine to sit: Mod assist Sit to supine: Min assist   General bed mobility comments: up to EOB with lifting help for trunk, A to initiate moving legs off EOB; assist in supine for positioning    Transfers Overall transfer level: Needs assistance Equipment used: Rolling walker (2 wheels) Transfers: Sit to/from Stand, Bed to chair/wheelchair/BSC Sit to Stand: Mod assist, Min assist   Step pivot transfers: Mod assist       General transfer comment: up to stand to RW with some lifting help, then pt using foot board on bed to step to chair beside bed with increased time and A for safety with lines; stood from chair at bedside with rail on bed and min A, stepping back to bed with shuffling pivotal steps holding rail on bed and min/CGA for guiding hips to sit on EOB    Ambulation/Gait               General Gait Details: feeling weak and did not want to walk  Stairs            Wheelchair Mobility     Tilt Bed    Modified Rankin (Stroke Patients Only)       Balance Overall balance assessment: Needs assistance   Sitting balance-Leahy Scale: Fair Sitting balance - Comments: son stayed with pt sitting in chair with no armrests while PT changed bed linen for safety   Standing balance support: Bilateral upper extremity supported Standing balance-Leahy Scale: Poor Standing balance comment: heavy UE support  needed for balance                             Pertinent Vitals/Pain Pain Assessment Pain Assessment: No/denies pain    Home Living Family/patient expects to be discharged to:: Private residence Living Arrangements: Spouse/significant other Available Help at Discharge: Family Type of Home: House     Entrance [place] of Steps: level from garage to elevator Alternate Level Stairs-Number of Steps: elevator Home Layout: Two level Home Equipment: Shower seat - built in;Grab bars - tub/shower;Wheelchair - Careers adviser  (comment) Additional Comments: states bulit in seat makes difficult for her to reach him next to wall, when son is there he gets in shower with him, states not enough room for another shower chair, trying to see about walk in tub    Prior Function Prior Level of Function : Needs assist             Mobility Comments: [place] short distance ambulation at times with parkinson's metronome rollator. Uses w/c as well depending on how he is feeling. Assistance needed at times. ADLs Comments: Assisted for all ADL's by family.     Extremity/Trunk Assessment   Upper Extremity Assessment Upper Extremity Assessment: Overall WFL for tasks assessed RUE Deficits / Details: was having R UE weakness though today seems close to L (son reports at times symptoms come and go with Parkinson's and brain tumor)    Lower Extremity Assessment RLE Deficits / Details: AROM WFL, strength hip flexion 2+/5, knee extension 4-/5, ankle DF 4-/5 (toes curling down during testing) RLE Sensation: WNL LLE Deficits / Details: AROM WFL, strength hip flexion 3+/5, knee extension 4+/5, ankle DF 4/5 LLE Sensation: WNL    Cervical / Trunk Assessment Cervical / Trunk Assessment: Kyphotic  Communication   Communication Communication: Impaired Factors Affecting Communication: Reduced clarity of speech (whispers)    Cognition Arousal: Lethargic Behavior During Therapy: WFL for tasks assessed/performed, Flat affect   PT - Cognitive impairments: History of cognitive impairments                       PT - Cognition Comments: wife reports hallucinations at night with some anxiety (was in [place]) Following commands: Intact       Cueing Cueing Techniques: Verbal cues     General Comments General comments (skin integrity, edema, etc.): h/o orthostatic hypotension with BP's stable today.  Discussed with wife and son plans for follow up PT via HHPT until after treatment plan figured out for tumor then consider  outpatient PT    Exercises     Assessment/Plan    PT Assessment Patient needs continued PT services  PT Problem List Decreased strength;Decreased activity tolerance;Decreased balance;Decreased mobility;Decreased knowledge of use of DME       PT Treatment Interventions DME instruction;Gait training;Functional mobility training;Therapeutic activities;Therapeutic exercise;Balance training;Patient/family education    PT Goals (Current goals can be found in the Care Plan section)  Acute Rehab PT Goals Patient Stated Goal: home with resuming HHPT PT Goal Formulation: With patient/family Time For Goal Achievement: 10/08/23 Potential to Achieve Goals: Fair    Frequency Min 2X/week     Co-evaluation               AM-PAC PT 6 Clicks Mobility  Outcome Measure Help needed turning from your back to your side while in a flat bed without using bedrails?: A Lot Help needed moving from lying on your back to  sitting on the side of a flat bed without using bedrails?: A Lot Help needed moving to and from a bed to a chair (including a wheelchair)?: A Lot Help needed standing up from a chair using your arms (e.g., wheelchair or bedside chair)?: A Lot Help needed to walk in hospital room?: Total Help needed climbing 3-5 steps with a railing? : Total 6 Click Score: 10    End of Session Equipment Utilized During Treatment: Gait belt Activity Tolerance: Patient limited by fatigue Patient left: in bed;with call bell/phone within reach;with family/visitor present   PT Visit Diagnosis: Other abnormalities of gait and mobility (R26.89);Muscle weakness (generalized) (M62.81)    Time: 8471-8391 PT Time Calculation (min) (ACUTE ONLY): 40 min   Charges:   PT Evaluation $PT Eval Moderate Complexity: 1 Mod PT Treatments $Therapeutic Activity: 8-22 mins PT General Charges $$ ACUTE PT VISIT: 1 Visit         [name] [name], PT Acute Rehabilitation  Services Office:[phone] 09/24/2023   [name] [name] 09/24/2023, 5:15 PM

## 2023-09-25 ENCOUNTER — Other Ambulatory Visit (HOSPITAL_COMMUNITY): Payer: Self-pay

## 2023-09-25 DIAGNOSIS — G9389 Other specified disorders of brain: Secondary | ICD-10-CM

## 2023-09-25 DIAGNOSIS — R531 Weakness: Secondary | ICD-10-CM | POA: Diagnosis not present

## 2023-09-25 DIAGNOSIS — R569 Unspecified convulsions: Secondary | ICD-10-CM | POA: Diagnosis not present

## 2023-09-25 DIAGNOSIS — G20A1 Parkinson's disease without dyskinesia, without mention of fluctuations: Secondary | ICD-10-CM

## 2023-09-25 LAB — CBC
HCT: 37.5 % — ABNORMAL LOW (ref 39.0–52.0)
Hemoglobin: 13 g/dL (ref 13.0–17.0)
MCH: 32.5 pg (ref 26.0–34.0)
MCHC: 34.7 g/dL (ref 30.0–36.0)
MCV: 93.8 fL (ref 80.0–100.0)
Platelets: 370 K/uL (ref 150–400)
RBC: 4 MIL/uL — ABNORMAL LOW (ref 4.22–5.81)
RDW: 12.5 % (ref 11.5–15.5)
WBC: 14.6 K/uL — ABNORMAL HIGH (ref 4.0–10.5)
nRBC: 0 % (ref 0.0–0.2)

## 2023-09-25 LAB — BASIC METABOLIC PANEL WITH GFR
Anion gap: 1 — ABNORMAL LOW (ref 5–15)
BUN: 33 mg/dL — ABNORMAL HIGH (ref 8–23)
CO2: 25 mmol/L (ref 22–32)
Calcium: 8.3 mg/dL — ABNORMAL LOW (ref 8.9–10.3)
Chloride: 108 mmol/L (ref 98–111)
Creatinine, Ser: 0.96 mg/dL (ref 0.61–1.24)
GFR, Estimated: >60 mL/min (ref >60)
Glucose, Bld: 129 mg/dL — ABNORMAL HIGH (ref 70–99)
Potassium: 4.6 mmol/L (ref 3.5–5.1)
Sodium: 134 mmol/L — ABNORMAL LOW (ref 135–145)

## 2023-09-25 LAB — PHOSPHORUS: Phosphorus: 3.2 mg/dL (ref 2.5–4.6)

## 2023-09-25 LAB — MAGNESIUM: Magnesium: 2 mg/dL (ref 1.7–2.4)

## 2023-09-25 MED ORDER — LEVETIRACETAM 500 MG PO TABS
500.0000 mg | ORAL_TABLET | Freq: Two times a day (BID) | ORAL | 0 refills | Status: DC
  Filled 2023-09-25: qty 60, 30d supply, fill #0

## 2023-09-25 MED ORDER — PANTOPRAZOLE SODIUM 40 MG PO TBEC
40.0000 mg | DELAYED_RELEASE_TABLET | Freq: Every day | ORAL | 0 refills | Status: DC
  Filled 2023-09-25: qty 30, 30d supply, fill #0

## 2023-09-25 NOTE — TOC Transition Note (Addendum)
 Transition of Care [facility]) - Discharge Note   Patient Details  Name: [name] [name] MRN: [id number] Date of Birth: 1945/06/11  Transition of Care [facility]) CM/SW Contact:  Marval Gell, RN Phone Number: 09/25/2023, 11:06 AM   Clinical Narrative:     Patient active w [facility], notified liaison that he will DC today.  Spoke w patient and he states he will travel home by private car, wife is on the way to the hospital.  Called wife, no answer  13:30 notified by [facility] he was a non admit and wanted [facility], I have notified [facility] of referral, pending   [facility] accepted   Final next level of care: Home w Home Health Services Barriers to Discharge: No Barriers Identified   Patient Goals and CMS Choice            Discharge Placement                       Discharge Plan and Services Additional resources added to the After Visit Summary for                  DME Arranged: N/A           HH Agency: [facility] Date HH Agency Contacted: 09/25/23 Time HH Agency Contacted: 1105 Representative spoke with at [facility] Agency: Donette- text and voicemail  Social Drivers of Health (SDOH) Interventions SDOH Screenings   Food Insecurity: No Food Insecurity (09/24/2023)  Housing: Low Risk  (09/24/2023)  Transportation Needs: No Transportation Needs (09/24/2023)  Utilities: Not At Risk (09/24/2023)  Social Connections: Moderately Isolated (09/24/2023)  Tobacco Use: Low Risk  (09/23/2023)     Readmission Risk Interventions    09/20/2023   10:17 AM 07/10/2023   12:35 PM  Readmission Risk Prevention Plan  Post Dischage Appt  Complete  Medication Screening  Complete  Transportation Screening Complete Complete  HRI or Home Care Consult Complete   Social Work Consult for Recovery Care Planning/Counseling Complete   Palliative Care Screening Complete   Medication Review Oceanographer) Complete

## 2023-09-25 NOTE — Discharge Instructions (Signed)
 Do not drive, operating heavy machinery, perform activities at heights, swimming or participation in water  activities or provide baby sitting services due to seizures.  Follow with Primary MD Beverley Louann DASEN, MD in 7 days   Get CBC, CMP,  checked  by Primary MD next visit.    Disposition Home    Diet: Heart Healthy    On your next visit with your primary care physician please Get Medicines reviewed and adjusted.   Please request your Prim.MD to go over all Hospital Tests and Procedure/Radiological results at the follow up, please get all Hospital records sent to your Prim MD by signing hospital release before you go home.   If you experience worsening of your admission symptoms, develop shortness of breath, life threatening emergency, suicidal or homicidal thoughts you must seek medical attention immediately by calling 911 or calling your MD immediately  if symptoms less severe.  You Must read complete instructions/literature along with all the possible adverse reactions/side effects for all the Medicines you take and that have been prescribed to you. Take any new Medicines after you have completely understood and accpet all the possible adverse reactions/side effects.    Do not drive when taking Pain medications.    Do not take more than prescribed Pain, Sleep and Anxiety Medications  Special Instructions: If you have smoked or chewed Tobacco  in the last 2 yrs please stop smoking, stop any regular Alcohol   and or any Recreational drug use.  Wear Seat belts while driving.   Please note  You were cared for by a hospitalist during your hospital stay. If you have any questions about your discharge medications or the care you received while you were in the hospital after you are discharged, you can call the unit and asked to speak with the hospitalist on call if the hospitalist that took care of you is not available. Once you are discharged, your primary care physician will handle any  further medical issues. Please note that NO REFILLS for any discharge medications will be authorized once you are discharged, as it is imperative that you return to your primary care physician (or establish a relationship with a primary care physician if you do not have one) for your aftercare needs so that they can reassess your need for medications and monitor your lab values.

## 2023-09-25 NOTE — Discharge Summary (Signed)
 Physician Discharge Summary  [name] FMW:[id number] DOB: Oct 28, 1945 DOA: 09/23/2023  PCP: Beverley Louann ONEIDA, MD  Admit date: 09/23/2023 Discharge date: 09/25/2023  Admitted From: (Home) Disposition:  (Home)  Recommendations for Outpatient Follow-up:  Follow up with PCP in 1-2 weeks Please obtain BMP/CBC in one week   Home Health: (YES) Diet recommendation: Heart Healthy   Brief/Interim Summary:  [name] [name] is a 78 y.o. male with medical history significant for brain mass, Parkinson's. Patient was brought to the ED with reports of right-sided weakness, slurred speech and right facial droop.   Patient was just hospitalized 8/13 to 8/18-for dysarthria, confusion.  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  He was started on Decadron  for cerebral edema and to follow-up as outpatient with neurosurgery 8/20. as he was deconditioned, SNF was recommended, but spouse declined. -Teleneurologist Dr. Voncile was consulted while presentation are concerning for seizure in the setting of left-sided cerebral edema from brain mass.  [facility] preference in case LTM EEG is required, steroid dose increased to 4 mg every 6 hourly, loaded with Keppra  3 g, continue 500 twice daily.  Will be discharged on Keppra  500 mg twice daily and home dose Decadron  4 mg twice daily per neurology recommendation   New onset seizures - Is most likely in the setting of recent diagnosis of brain mass with surrounding edema. - Managment per neurology, loaded with IV Keppra , continue with Keppra  500 mg twice daily on discharge. - Continue with seizure precautions.     Right sided weakness -Has resolved, this is most likey due to [place], paralysis -Plan to repeat CT head given worsening numbness to rule out any intracranial hemorrhage     Brain mass - Recent diagnosis recent hospitalization 8/13 to 8/18. - Patient was seen by neurosurgery Dr. Lanis yesterday  . - To see Dr. [name] on Tuesday, message has been sent to her neuro-oncology regarding patient admissions . - Continue with Decadron , thank discussed with neurology, will decrease back to home dose of 4 mg twice daily    Parkinson's disease (HCC) Resume Sinemet , levodopa    History of orthostatic hypotension - Midodrine  and Florinef     Leuckocytocis - This is secondary to steroids and seizures   Discharge Diagnoses:  Principal Problem:   Right sided weakness Active Problems:   Parkinson's disease (HCC)   Brain mass    Discharge Instructions  Discharge Instructions     Diet - low sodium heart healthy   Complete by: As directed    Discharge instructions   Complete by: As directed    Do not drive, operating heavy machinery, perform activities at heights, swimming or participation in water  activities or provide baby sitting services due to seizures.  Follow with Primary MD Beverley Louann ONEIDA, MD in 7 days   Get CBC, CMP,  checked  by Primary MD next visit.    Disposition Home    Diet: Heart Healthy    On your next visit with your primary care physician please Get Medicines reviewed and adjusted.   Please request your Prim.MD to go over all Hospital Tests and Procedure/Radiological results at the follow up, please get all Hospital records sent to your Prim MD by signing hospital release before you go home.   If you experience worsening of your admission symptoms, develop shortness of breath, life threatening emergency, suicidal or homicidal thoughts you must seek medical attention immediately by calling 911 or calling your MD immediately  if symptoms less severe.  You Must read complete instructions/literature along with all the possible adverse reactions/side effects for all the Medicines you take and that have been prescribed to you. Take any new Medicines after you have completely understood and accpet all the possible adverse reactions/side effects.    Do not drive  when taking Pain medications.    Do not take more than prescribed Pain, Sleep and Anxiety Medications  Special Instructions: If you have smoked or chewed Tobacco  in the last 2 yrs please stop smoking, stop any regular Alcohol   and or any Recreational drug use.  Wear Seat belts while driving.   Please note  You were cared for by a hospitalist during your hospital stay. If you have any questions about your discharge medications or the care you received while you were in the hospital after you are discharged, you can call the unit and asked to speak with the hospitalist on call if the hospitalist that took care of you is not available. Once you are discharged, your primary care physician will handle any further medical issues. Please note that NO REFILLS for any discharge medications will be authorized once you are discharged, as it is imperative that you return to your primary care physician (or establish a relationship with a primary care physician if you do not have one) for your aftercare needs so that they can reassess your need for medications and monitor your lab values.   Increase activity slowly   Complete by: As directed       Allergies as of 09/25/2023       Reactions   Alfuzosin  Other (See Comments)   Hypotension    Rivastigmine Tartrate Nausea And Vomiting    Can tolerate the patch but can not tolerate the pills   Tamsulosin Other (See Comments)   Severe joint pain        Medication List     TAKE these medications    carbidopa -levodopa  50-200 MG tablet Commonly known as: SINEMET  CR Take 1 tablet by mouth at bedtime. What changed: Another medication with the same name was changed. Make sure you understand how and when to take each.   carbidopa -levodopa  25-100 MG tablet Commonly known as: SINEMET  IR 2-2.5 tablets 4 times per day at 6am/10:30am/2pm/6pm What changed:  how much to take how to take this when to take this additional instructions   cyanocobalamin   1000 MCG tablet Take 1 tablet (1,000 mcg total) by mouth daily. What changed: when to take this   dexamethasone  4 MG tablet Commonly known as: DECADRON  Take 1 tablet (4 mg total) by mouth every 12 (twelve) hours for 14 days.   fludrocortisone  0.1 MG tablet Commonly known as: FLORINEF  Take 2 tablets (0.2 mg total) by mouth 3 (three) times daily with meals. What changed: additional instructions   Inbrija  42 MG Caps Generic drug: Levodopa  We are going to start inbrija .  Remember that TWO capsules is ONE dosage (never inhale just one capsule).  You can inhale the capsules as needed up to 5 times per day, separated by 2 hour intervals. What changed:  how much to take how to take this when to take this reasons to take this   levETIRAcetam  500 MG tablet Commonly known as: Keppra  Take 1 tablet (500 mg total) by mouth 2 (two) times daily.   midodrine  5 MG tablet Commonly known as: PROAMATINE  Take 1 tablet (5 mg total) by mouth 3 (three) times daily with meals.   pantoprazole  40  MG tablet Commonly known as: PROTONIX  Take 1 tablet (40 mg total) by mouth daily. Start taking on: September 26, 2023   potassium chloride  SA 20 MEQ tablet Commonly known as: KLOR-CON  M Take 20 mEq by mouth daily.        Follow-up Information     [facility]  Follow up.   Why: for home health services Contact information: [address] [place] [address] [phone]                Allergies  Allergen Reactions   Alfuzosin  Other (See Comments)    Hypotension    Rivastigmine Tartrate Nausea And Vomiting     Can tolerate the patch but can not tolerate the pills   Tamsulosin Other (See Comments)    Severe joint pain    Consultations: Neurology      Subjective: No significant events overnight as discussed with son at bedside,  Discharge Exam: Vitals:   09/25/23 0800 09/25/23 1127  BP: 133/77 117/64  Pulse: 60 64  Resp: 20   Temp: 98 F  (36.7 C) (!) 97.1 F (36.2 C)  SpO2: 96% 93%   Vitals:   09/25/23 0000 09/25/23 0400 09/25/23 0800 09/25/23 1127  BP: 125/74 138/72 133/77 117/64  Pulse: 63 67 60 64  Resp: 12  20   Temp: 98.4 F (36.9 C) 98.1 F (36.7 C) 98 F (36.7 C) (!) 97.1 F (36.2 C)  TempSrc: Oral Oral Oral Oral  SpO2: 95% 96% 96% 93%  Weight:      Height:        General: Pt is alert, awake, not in acute distress, severely frail, deconditioned Cardiovascular: RRR, S1/S2 +, no rubs, no gallops Respiratory: CTA bilaterally Abdominal: Soft Extremities: no edema, no cyanosis    The results of significant diagnostics from this hospitalization (including imaging, microbiology, ancillary and laboratory) are listed below for reference.     Microbiology: No results found for this or any previous visit (from the past 240 hours).   Labs: BNP (last 3 results) No results for input(s): BNP in the last 8760 hours. Basic Metabolic Panel: Recent Labs  Lab 09/23/23 1714 09/25/23 0527  NA 138 134*  K 4.3 4.6  CL 103 108  CO2 23 25  GLUCOSE 109* 129*  BUN 39* 33*  CREATININE 1.07 0.96  CALCIUM  8.3* 8.3*  MG  --  2.0  PHOS  --  3.2   Liver Function Tests: Recent Labs  Lab 09/23/23 1714  AST 21  ALT 10  ALKPHOS 58  BILITOT 0.8  PROT 5.6*  ALBUMIN 2.9*   No results for input(s): LIPASE, AMYLASE in the last 168 hours. No results for input(s): AMMONIA in the last 168 hours. CBC: Recent Labs  Lab 09/23/23 1714 09/25/23 0527  WBC 14.1* 14.6*  NEUTROABS 12.0*  --   HGB 12.9* 13.0  HCT 38.7* 37.5*  MCV 95.3 93.8  PLT 406* 370   Cardiac Enzymes: No results for input(s): CKTOTAL, CKMB, CKMBINDEX, TROPONINI in the last 168 hours. BNP: Invalid input(s): POCBNP CBG: Recent Labs  Lab 09/18/23 1606 09/23/23 1649 09/24/23 0038  GLUCAP 153* 128* 129*   D-Dimer No results for input(s): DDIMER in the last 72 hours. Hgb A1c No results for input(s): HGBA1C in the last  72 hours. Lipid Profile No results for input(s): CHOL, HDL, LDLCALC, TRIG, CHOLHDL, LDLDIRECT in the last 72 hours. Thyroid  function studies No results for input(s): TSH, T4TOTAL, T3FREE, THYROIDAB in the  last 72 hours.  Invalid input(s): FREET3 Anemia work up No results for input(s): VITAMINB12, FOLATE, FERRITIN, TIBC, IRON, RETICCTPCT in the last 72 hours. Urinalysis    Component Value Date/Time   COLORURINE YELLOW 09/15/2023 1032   APPEARANCEUR HAZY (A) 09/15/2023 1032   LABSPEC 1.015 09/15/2023 1032   PHURINE 7.5 09/15/2023 1032   GLUCOSEU 100 (A) 09/15/2023 1032   HGBUR NEGATIVE 09/15/2023 1032   BILIRUBINUR NEGATIVE 09/15/2023 1032   KETONESUR NEGATIVE 09/15/2023 1032   PROTEINUR NEGATIVE 09/15/2023 1032   UROBILINOGEN 0.2 12/15/2012 1201   NITRITE NEGATIVE 09/15/2023 1032   LEUKOCYTESUR NEGATIVE 09/15/2023 1032   Sepsis Labs Recent Labs  Lab 09/23/23 1714 09/25/23 0527  WBC 14.1* 14.6*   Microbiology No results found for this or any previous visit (from the past 240 hours).   Time coordinating discharge: Over 30 minutes  SIGNED:   Brayton Lye, MD  [facility] 09/25/2023, 2:35 PM Pager   If 7PM-7AM, please contact night-coverage [email or web address] Password [email or web address]

## 2023-09-27 ENCOUNTER — Inpatient Hospital Stay: Attending: Neurosurgery

## 2023-09-27 DIAGNOSIS — Z79899 Other long term (current) drug therapy: Secondary | ICD-10-CM | POA: Insufficient documentation

## 2023-09-27 DIAGNOSIS — G20A1 Parkinson's disease without dyskinesia, without mention of fluctuations: Secondary | ICD-10-CM | POA: Insufficient documentation

## 2023-09-27 DIAGNOSIS — G939 Disorder of brain, unspecified: Secondary | ICD-10-CM | POA: Insufficient documentation

## 2023-09-28 ENCOUNTER — Inpatient Hospital Stay (HOSPITAL_BASED_OUTPATIENT_CLINIC_OR_DEPARTMENT_OTHER): Admitting: Internal Medicine

## 2023-09-28 VITALS — BP 127/76 | HR 65 | Temp 98.4°F | Resp 16 | Ht 70.0 in

## 2023-09-28 DIAGNOSIS — G939 Disorder of brain, unspecified: Secondary | ICD-10-CM

## 2023-09-28 DIAGNOSIS — Z79899 Other long term (current) drug therapy: Secondary | ICD-10-CM | POA: Diagnosis not present

## 2023-09-28 DIAGNOSIS — G9389 Other specified disorders of brain: Secondary | ICD-10-CM

## 2023-09-28 DIAGNOSIS — G20A1 Parkinson's disease without dyskinesia, without mention of fluctuations: Secondary | ICD-10-CM | POA: Diagnosis not present

## 2023-09-28 NOTE — Progress Notes (Signed)
 [facility] at [facility] [address]  [place], [place] [address] [phone]   New Patient Evaluation  Date of Service: 09/28/23 Patient Name: [name] [name] Patient MRN: [id number] Patient DOB: 05/14/1945 Provider: Arthea MARLA Manns, MD  Identifying Statement:  CHADD TOLLISON is a 78 y.o. male with left frontal mass who presents for initial consultation and evaluation.    Referring Provider: Beverley Louann ONEIDA, MD [address] [place],  [place] [id number]  Oncologic History: Oncology History   No history exists.    Biomarkers:  MGMT Unknown.  IDH 1/2 Unknown.  EGFR Unknown  TERT Unknown   History of Present Illness: The patient's records from the referring physician were obtained and reviewed and the patient interviewed to confirm this HPI.  Jolee ONEIDA Pesa presented to neurologic attention this month with several days history of slurred speech and right sided weakness.  He also had seizure episode described as staring head turned to left, followed by 1-2 days of confusion.  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  Was started on decadron  4mg  twice per day, which has ultimately not led to improvement in his symptoms.  He has baseline mobility issues from Parkinson's disease, had a deep brain stimulator placed in 2014.  Keppra  remains at 500mg  twice per day without issue.  Medications: Current Outpatient Medications on File Prior to Visit  Medication Sig Dispense Refill   carbidopa -levodopa  (SINEMET  CR) 50-200 MG tablet Take 1 tablet by mouth at bedtime.     carbidopa -levodopa  (SINEMET  IR) 25-100 MG tablet 2-2.5 tablets 4 times per day at 6am/10:30am/2pm/6pm (Patient taking differently: Take 2 tablets by mouth 4 (four) times daily. 2 tablets 4 times per day at 6am/10:30am/2pm/6pm)     cyanocobalamin  1000 MCG tablet Take 1 tablet (1,000 mcg total) by mouth daily. (Patient taking differently: Take 1,000 mcg  by mouth every other day.) 30 tablet 0   dexamethasone  (DECADRON ) 4 MG tablet Take 1 tablet (4 mg total) by mouth every 12 (twelve) hours for 14 days. 28 tablet 0   fludrocortisone  (FLORINEF ) 0.1 MG tablet Take 2 tablets (0.2 mg total) by mouth 3 (three) times daily with meals. (Patient taking differently: Take 0.2 mg by mouth 3 (three) times daily with meals. Hold for increased blood pressure) 180 tablet 0   levETIRAcetam  (KEPPRA ) 500 MG tablet Take 1 tablet (500 mg total) by mouth 2 (two) times daily. 60 tablet 0   Levodopa  (INBRIJA ) 42 MG CAPS We are going to start inbrija .  Remember that TWO capsules is ONE dosage (never inhale just one capsule).  You can inhale the capsules as needed up to 5 times per day, separated by 2 hour intervals. (Patient taking differently: Place 1 capsule into inhaler and inhale as needed (can't walk, muscle rigidedness). We are going to start inbrija .  Remember that TWO capsules is ONE dosage (never inhale just one capsule).  You can inhale the capsules as needed up to 5 times per day, separated by 2 hour intervals.)     midodrine  (PROAMATINE ) 5 MG tablet Take 1 tablet (5 mg total) by mouth 3 (three) times daily with meals. 270 tablet 1   pantoprazole  (PROTONIX ) 40 MG tablet Take 1 tablet (40 mg total) by mouth daily. 30 tablet 0   potassium chloride  SA (KLOR-CON  M) 20 MEQ tablet Take 20 mEq by mouth daily.     No current facility-administered medications on file prior to visit.    Allergies:  Allergies  Allergen Reactions   Alfuzosin  Other (See Comments)    Hypotension    Rivastigmine Tartrate Nausea And Vomiting     Can tolerate the patch but can not tolerate the pills   Tamsulosin Other (See Comments)    Severe joint pain   Past Medical History:  Past Medical History:  Diagnosis Date   Agent orange exposure    Anemia    BPH (benign prostatic hypertrophy)    Cancer (HCC)    basal cell temple   Constipation    Depression    Fatigue    d/t Parkinson's    GERD (gastroesophageal reflux disease)    occasional   History of kidney stones    Left ureteral calculus    Lower urinary tract symptoms (LUTS)    Nephrolithiasis    Neuromuscular disorder (HCC)     parkinson's   Orthostatic hypotension    d/t Parkinson's disease   Orthostatic hypotension    Parkinson's disease (HCC)    Peyronie disease    Pneumonia 2018   Renal cyst, left    S/P deep brain stimulator placement    11-29-2012   Sleep behavior disorder, REM    Wears glasses    Wears hearing aid    BILATERAL-- INTERMITTANT WEARS   Past Surgical History:  Past Surgical History:  Procedure Laterality Date   CATARACT EXTRACTION Bilateral    CHOLECYSTECTOMY  12/21/2014   Procedure: LAPAROSCOPIC CHOLECYSTECTOMY;  Surgeon: Lynda Leos, MD;  Location: [facility];  Service: General;;   COLONOSCOPY     CYSTO/  LEFT URETEROSOCPY/   LEFT URETERAL STENT PLACEMENT/  LASER BLADDER STONES AND EXTRACTION  09/25/2010   CYSTOSCOPY WITH INSERTION OF UROLIFT N/A 03/11/2017   Procedure: CYSTOSCOPY WITH INSERTION OF UROLIFT, LITHOPAXY,STONE OBTAINED;  Surgeon: Matilda Senior, MD;  Location: [facility];  Service: Urology;  Laterality: N/A;   CYSTOSCOPY WITH LITHOLAPAXY N/A 09/12/2020   Procedure: CYSTOSCOPY WITH LITHOLAPAXY, LEFT RETROGRADE PYELOGRAMS, LEFT URETEROSCOPY, AND STENT PLACEMENT;  Surgeon: Cam Morene ORN, MD;  Location: [facility];  Service: Urology;  Laterality: N/A;  REQUESTING 2 HRS   CYSTOSCOPY WITH LITHOLAPAXY Right 07/14/2023   Procedure: CYSTOSCOPY, WITH BLADDER CALCULUS LITHOLAPAXY;  Surgeon: Cam Morene ORN, MD;  Location: [facility];  Service: Urology;  Laterality: Right;  CYSTOLITHOLAPAXY, RIGHT URETEROSCOPY, LASER LITHOTRIPSY, STONE EXTRACTION, RIGHT URETERAL STENT PLACEMENT   CYSTOSCOPY WITH RETROGRADE PYELOGRAM, URETEROSCOPY AND STENT PLACEMENT Left 01/06/2013   Procedure: CYSTOSCOPY WITH RETROGRADE PYELOGRAM, URETEROSCOPY AND STENT  PLACEMENT;  Surgeon: Thomasine Oiler, MD;  Location: [facility] [place];  Service: Urology;  Laterality: Left;   CYSTOSCOPY WITH RETROGRADE PYELOGRAM, URETEROSCOPY AND STENT PLACEMENT Left 09/11/2014   Procedure: CYSTOSCOPY WITH LEFT RETROGRADE PYELOGRAM, URETEROSCOPY AND STENT PLACEMENT;  Surgeon: Oliva Oiler, MD;  Location: [facility];  Service: Urology;  Laterality: Left;   CYSTOSCOPY/RETROGRADE/URETEROSCOPY/STONE EXTRACTION WITH BASKET Right 07/14/2023   Procedure: CYSTOSCOPY, WITH CALCULUS REMOVAL USING BASKET;  Surgeon: Cam Morene ORN, MD;  Location: [facility];  Service: Urology;  Laterality: Right;   CYSTOSCOPY/URETEROSCOPY/HOLMIUM LASER/STENT PLACEMENT Right 07/14/2023   Procedure: CYSTOSCOPY/URETEROSCOPY/HOLMIUM LASER/STENT PLACEMENT;  Surgeon: Cam Morene ORN, MD;  Location: [facility];  Service: Urology;  Laterality: Right;   DEEP BRAIN STIMULATOR PLACEMENT  11/29/2012   genertor device at left pectoral area-   EXTRACORPOREAL SHOCK WAVE LITHOTRIPSY Left 10-11-2010  &  01-05-2011   HOLMIUM LASER APPLICATION Left 01/06/2013   Procedure: HOLMIUM LASER APPLICATION;  Surgeon: Thomasine Oiler, MD;  Location: [facility];  Service: Urology;  Laterality: Left;   HOLMIUM LASER APPLICATION Left 09/11/2014   Procedure: HOLMIUM LASER APPLICATION;  Surgeon: Oliva Oiler, MD;  Location: [facility];  Service: Urology;  Laterality: Left;   HOLMIUM LASER APPLICATION N/A 03/11/2017   Procedure: HOLMIUM LASER APPLICATION;  Surgeon: Matilda Senior, MD;  Location: [facility];  Service: Urology;  Laterality: N/A;   NEGATIVE SLEEP STUDY  2013  per pt   NEPHROLITHOTOMY Right 08/13/2022   Procedure: RIGHT NEPHROLITHOTOMY PERCUTANEOUS;  Surgeon: Cam Morene ORN, MD;  Location: [facility];  Service: Urology;  Laterality: Right;  210 MINUTES   SP PERC NEPHROSTOMY Left 12/16/2012   STONE EXTRACTION WITH BASKET Left 01/06/2013   Procedure: STONE  EXTRACTION WITH BASKET;  Surgeon: Thomasine Oiler, MD;  Location: [facility];  Service: Urology;  Laterality: Left;   TONSILLECTOMY     TRANSURETHRAL RESECTION OF PROSTATE N/A 09/12/2020   Procedure: TRANSURETHRAL RESECTION OF THE PROSTATE (TURP);  Surgeon: Cam Morene ORN, MD;  Location: [facility];  Service: Urology;  Laterality: N/A;   Social History:  Social History   Socioeconomic History   Marital status: Married    Spouse name: Not on file   Number of children: Not on file   Years of education: Not on file   Highest education level: Not on file  Occupational History   Not on file  Tobacco Use   Smoking status: Never   Smokeless tobacco: Never  Vaping Use   Vaping status: Never Used  Substance and Sexual Activity   Alcohol  use: Not Currently    Comment: rarely beer   Drug use: No   Sexual activity: Not Currently  Other Topics Concern   Not on file  Social History Narrative   Not on file   Social Drivers of Health   Financial Resource Strain: Not on file  Food Insecurity: No Food Insecurity (09/24/2023)   Hunger Vital Sign    Worried About Running Out of Food in the Last Year: Never true    Ran Out of Food in the Last Year: Never true  Transportation Needs: No Transportation Needs (09/24/2023)   PRAPARE - Transportation    Lack of Transportation (Medical): No    Lack of Transportation (Non-Medical): No  Physical Activity: Not on file  Stress: Not on file  Social Connections: Moderately Isolated (09/24/2023)   Social Connection and Isolation Panel    Frequency of Communication with Friends and Family: Three times a week    Frequency of Social Gatherings with Friends and Family: Three times a week    Attends Religious Services: Never    Active Member of Clubs or Organizations: No    Attends Banker Meetings: Never    Marital Status: Married  Catering manager Violence: Not At Risk (09/24/2023)   Humiliation, Afraid,  Rape, and Kick questionnaire    Fear of Current or Ex-Partner: No    Emotionally Abused: No    Physically Abused: No    Sexually Abused: No   Family History:  Family History  Problem Relation Age of Onset   Hypertension Mother    Dementia Father    Cancer Father    Heart attack Sister    Arthritis/Rheumatoid Sister     Review of Systems: Constitutional: Doesn't report fevers, chills or abnormal weight loss Eyes: Doesn't report blurriness of vision Ears, nose, mouth, throat, and face: Doesn't report sore throat Respiratory: Doesn't report cough, dyspnea or  wheezes Cardiovascular: Doesn't report palpitation, chest discomfort  Gastrointestinal:  Doesn't report nausea, constipation, diarrhea GU: Doesn't report incontinence Skin: Doesn't report skin rashes Neurological: Per HPI Musculoskeletal: Doesn't report joint pain Behavioral/Psych: Doesn't report anxiety  Physical Exam: Vitals:   09/28/23 1459  BP: 127/76  Pulse: 65  Resp: 16  Temp: 98.4 F (36.9 C)  SpO2: 100%   KPS: 70. General: Alert, cooperative, pleasant, in no acute distress Head: Normal EENT: No conjunctival injection or scleral icterus.  Lungs: Resp effort normal Cardiac: Regular rate Abdomen: Non-distended abdomen Skin: No rashes cyanosis or petechiae. Extremities: No clubbing or edema  Neurologic Exam: Mental Status: Awake, alert, attentive to examiner. Oriented to self and environment. Language is fluent with intact comprehension.  Pyschomotor slowing. Hypophonic Cranial Nerves: Visual acuity is grossly normal. Visual fields are full. Extra-ocular movements intact. No ptosis. Face is symmetric Motor: Parkinsonian tremor with cogwheeling. Power is 4/5 in right arm and leg. Reflexes are symmetric, no pathologic reflexes present.  Sensory: Intact to light touch Gait: Non ambulatory   Labs: I have reviewed the data as listed    Component Value Date/Time   NA 134 (L) 09/25/2023 0527   K 4.6  09/25/2023 0527   CL 108 09/25/2023 0527   CO2 25 09/25/2023 0527   GLUCOSE 129 (H) 09/25/2023 0527   BUN 33 (H) 09/25/2023 0527   CREATININE 0.96 09/25/2023 0527   CALCIUM  8.3 (L) 09/25/2023 0527   PROT 5.6 (L) 09/23/2023 1714   ALBUMIN 2.9 (L) 09/23/2023 1714   AST 21 09/23/2023 1714   ALT 10 09/23/2023 1714   ALKPHOS 58 09/23/2023 1714   BILITOT 0.8 09/23/2023 1714   GFRNONAA >60 09/25/2023 0527   GFRAA >60 12/12/2014 1056   Lab Results  Component Value Date   WBC 14.6 (H) 09/25/2023   NEUTROABS 12.0 (H) 09/23/2023   HGB 13.0 09/25/2023   HCT 37.5 (L) 09/25/2023   MCV 93.8 09/25/2023   PLT 370 09/25/2023    Imaging: EXAM: MRI BRAIN WITH AND WITHOUT CONTRAST 09/15/2023 04:47:27 PM   TECHNIQUE: Multiplanar multisequence MRI of the head/brain was performed with and without the administration of intravenous contrast.   COMPARISON: Earlier same day CT.   CLINICAL HISTORY: Mental status change, unknown cause. AMS, pt has 2 conditional deep brain stimulators.   FINDINGS:   BRAIN AND VENTRICLES: There is a large ill-defined region of T2/FLAIR hyperintensity primarily within the left frontal lobe with extension into the high left parietal lobe near the vertex as well as extending into the left corona radiata, posterior superior left temporal lobe and partially extending into the left basal ganglia. Within this region there are irregular nodular areas of enhancement primarily along the periphery of T2 signal abnormality. There is an additional region of mild T2/FLAIR signal abnormality within the centrum semiovale with few additional areas of irregular enhancement. There is T2 shine through throughout the region of signal abnormality. One small focus of restricted diffusion within the lesion likely corresponding to a focus of enhancement. There is no definite infarct. Mild parenchymal volume loss. There is mild local mass effect within the left frontoparietal lobes  with sulcal effacement and partial effacement of the left lateral ventricle. No midline shift. The basilar cisterns are patent.   ORBITS: Bilateral lens replacement.   SINUSES: Mild mucosal thickening in the ethmoid sinuses.   BONES AND SOFT TISSUES: Bilateral deep brain stimulation leads noted. No significant signal abnormality adjacent to the deep brain stimulation leads.   IMPRESSION: 1.  Large ill-defined region of T2/FLAIR hyperintensity involving the left frontal lobe, high left parietal lobe, left corona radiata, posterior superior left temporal lobe, and partially the left basal ganglia, with irregular peripheral enhancement and mild local mass effect. No midline shift. 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 2. Additional mild T2/FLAIR signal abnormality within the right centrum semiovale with irregular enhancement. 3. No evidence of infarct or hemorrhage.   Electronically signed by: Donnice Mania MD 09/15/2023 06:26 PM EDT RP Workstation: [id number]   Pathology: n/a  Assessment/Plan Brain mass  We appreciate the opportunity to participate in the care of Jolee ONEIDA Pesa.  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  Case was reviewed in CNS tumor board this week.  We reviewed goals of care and discussed potential treatment options for both high grade glioma and CNS lymphoma.  He understands that although biopsy would be the next step in his care, it would not be necessary to go through surgery if he was not interested in pursuing treatment following the diagnosis.    He will take 1-2 days to discuss with his family, and let us  know if he ultimately wants to proceed with surgery.  If interested, we will let Dr. Lanis know that he would like to move forward with minimally invasive stereotactic bx.  In meantime, he can continue Keppra  500mg  BID but should decrease  decadron  to 4mg  daily if tolerated.  Screening for potential clinical trials was performed and discussed using eligibility criteria for active protocols at [facility], loco-regional tertiary centers, as well as national database available on GroundTransfer.at.    The patient is not a candidate for a research protocol at this time due to no suitable study identified.   We spent twenty additional minutes teaching regarding the natural history, biology, and historical experience in the treatment of brain tumors. We then discussed in detail the current recommendations for therapy focusing on the mode of administration, mechanism of action, anticipated toxicities, and quality of life issues associated with this plan. We also provided teaching sheets for the patient to take home as an additional resource.  All questions were answered. The patient knows to call the clinic with any problems, questions or concerns. No barriers to learning were detected.  The total time spent in the encounter was 60 minutes and more than 50% was on counseling and review of test results   Arthea MARLA Manns, MD Medical Director of Neuro-Oncology [facility] at [facility] 09/28/23 2:12 PM

## 2023-09-30 ENCOUNTER — Telehealth: Payer: Self-pay | Admitting: Neurology

## 2023-09-30 NOTE — Telephone Encounter (Signed)
 Patient wife advised we will start the [place] referral process it may not be approved until the month before his follow with Dr.Tat in January 2026. Due to the appt so far out pr [place].

## 2023-09-30 NOTE — Telephone Encounter (Signed)
 [name] [name]  the  wife called in and stated that her husband (pt) had a referral in for [facility] and it ends on August 31,2025. [name] [name]  stated that another referral needs to be put back in for the [facility] if it ends. Thanks

## 2023-10-01 ENCOUNTER — Telehealth: Payer: Self-pay | Admitting: *Deleted

## 2023-10-01 NOTE — Telephone Encounter (Signed)
 Received PC from patient's wife, [name] - she states the patient wants to proceed with plan for biopsy and treatment.  Informed her someone will contact them next week about plan of care, appointments, etc.  She verbalizes understanding.  Routed to MD

## 2023-10-05 ENCOUNTER — Other Ambulatory Visit: Payer: Self-pay | Admitting: Neurosurgery

## 2023-10-07 NOTE — Pre-Procedure Instructions (Signed)
 Surgical Instructions   Your procedure is scheduled on October 11, 2023. Report to [facility] Main Entrance A at 5:30 A.M., then check in with the Admitting office. Any questions or running late day of surgery: call [phone]  Questions prior to your surgery date: call [phone], Monday-Friday, 8am-4pm. If you experience any cold or flu symptoms such as cough, fever, chills, shortness of breath, etc. between now and your scheduled surgery, please notify us  at the above number.     Remember:  Do not eat or drink after midnight the night before your surgery    Take these medicines the morning of surgery with A SIP OF WATER  : Carbidopa -Levodopa  (Sinemet  IR) Dexamethasone  (Decadron ) Levetiracetam  (Keppra )   May take these medicines IF NEEDED: Levodopa  (Inbrija ) inhalation   One week prior to surgery, STOP taking any Aspirin (unless otherwise instructed by your surgeon) Aleve, Naproxen, Ibuprofen , Motrin , Advil , Goody's, BC's, all herbal medications, fish oil, and non-prescription vitamins.                     Do NOT Smoke (Tobacco/Vaping) for 24 hours prior to your procedure.  If you use a CPAP at night, you may bring your mask/headgear for your overnight stay.   You will be asked to remove any contacts, glasses, piercing's, hearing aid's, dentures/partials prior to surgery. Please bring cases for these items if needed.    Patients discharged the day of surgery will not be allowed to drive home, and someone needs to stay with them for 24 hours.  SURGICAL WAITING ROOM VISITATION Patients may have no more than 2 support people in the waiting area - these visitors may rotate.   Pre-op nurse will coordinate an appropriate time for 1 ADULT support person, who may not rotate, to accompany patient in pre-op.  Children under the age of 72 must have an adult with them who is not the patient and must remain in the main waiting area with an adult.  If the patient needs to stay at  the hospital during part of their recovery, the visitor guidelines for inpatient rooms apply.  Please refer to the [facility] website for the visitor guidelines for any additional information.   If you received a COVID test during your pre-op visit  it is requested that you wear a mask when out in public, stay away from anyone that may not be feeling well and notify your surgeon if you develop symptoms. If you have been in contact with anyone that has tested positive in the last 10 days please notify you surgeon.      Pre-operative CHG Bathing Instructions   You can play a key role in reducing the risk of infection after surgery. Your skin needs to be as free of germs as possible. You can reduce the number of germs on your skin by washing with CHG (chlorhexidine  gluconate) soap before surgery. CHG is an antiseptic soap that kills germs and continues to kill germs even after washing.   DO NOT use if you have an allergy to chlorhexidine /CHG or antibacterial soaps. If your skin becomes reddened or irritated, stop using the CHG and notify one of our RNs at [phone].              TAKE A SHOWER THE NIGHT BEFORE SURGERY AND THE DAY OF SURGERY    Please keep in mind the following:  DO NOT shave, including legs and underarms, 48 hours prior to surgery.   You may shave your  face before/day of surgery.  Place clean sheets on your bed the night before surgery Use a clean washcloth (not used since being washed) for each shower. DO NOT sleep with pet's night before surgery.  CHG Shower Instructions:  Wash your face and private area with normal soap. If you choose to wash your hair, wash first with your normal shampoo.  After you use shampoo/soap, rinse your hair and body thoroughly to remove shampoo/soap residue.  Turn the water  OFF and apply half the bottle of CHG soap to a CLEAN washcloth.  Apply CHG soap ONLY FROM YOUR NECK DOWN TO YOUR TOES (washing for 3-5 minutes)  DO NOT use CHG soap on  face, private areas, open wounds, or sores.  Pay special attention to the area where your surgery is being performed.  If you are having back surgery, having someone wash your back for you may be helpful. Wait 2 minutes after CHG soap is applied, then you may rinse off the CHG soap.  Pat dry with a clean towel  Put on clean pajamas    Additional instructions for the day of surgery: DO NOT APPLY any lotions, deodorants, cologne, or perfumes.   Do not wear jewelry or makeup Do not wear nail polish, gel polish, artificial nails, or any other type of covering on natural nails (fingers and toes) Do not bring valuables to the hospital. [facility] is not responsible for valuables/personal belongings. Put on clean/comfortable clothes.  Please brush your teeth.  Ask your nurse before applying any prescription medications to the skin.

## 2023-10-08 ENCOUNTER — Encounter (HOSPITAL_COMMUNITY)
Admission: RE | Admit: 2023-10-08 | Discharge: 2023-10-08 | Disposition: A | Discharge status: Home / Self Care | Source: Ambulatory Visit | Attending: Neurosurgery | Admitting: Neurosurgery

## 2023-10-08 ENCOUNTER — Other Ambulatory Visit: Payer: Self-pay

## 2023-10-08 ENCOUNTER — Encounter (HOSPITAL_COMMUNITY): Payer: Self-pay

## 2023-10-08 VITALS — BP 117/65 | HR 62 | Temp 98.1°F | Resp 18 | Ht 70.0 in | Wt 154.3 lb

## 2023-10-08 DIAGNOSIS — Z01818 Encounter for other preprocedural examination: Secondary | ICD-10-CM

## 2023-10-08 DIAGNOSIS — Z01812 Encounter for preprocedural laboratory examination: Secondary | ICD-10-CM | POA: Insufficient documentation

## 2023-10-08 HISTORY — DX: Unspecified convulsions: R56.9

## 2023-10-08 HISTORY — DX: Other complications of anesthesia, initial encounter: T88.59XA

## 2023-10-08 LAB — TYPE AND SCREEN
ABO/RH(D): A POS
Antibody Screen: NEGATIVE

## 2023-10-08 NOTE — Progress Notes (Signed)
 PCP - Beverley Louann DASEN, MD- Pt gets most of his care done at the [facility] Cardiologist - Court Dorn PARAS, MD- last seen in 2022, pt reports that he does see a cardiologist at the [facility], but he does not have any cardiac problems.  Neurology- [facility] Neurology- Asberry Tat  PPM/ICD - denies  Pt has DBS x2- wife states that she is unsure if Dr Lanis is aware that he has these, and she is unsure if the rep needs to be present. She is going to call Dr. Valeda office about this. Wife state she will bring the controller on the DOS  Chest x-ray - N/A EKG - 09/23/23 Stress Test - 08/13/20 ECHO -  Cardiac Cath - denies  Sleep Study - denies   Fasting Blood Sugar - N/a   Last dose of GLP1 agonist-  N/A GLP1 instructions:   Blood Thinner Instructions: N/A Aspirin Instructions:N/A  ERAS Protcol - NPO per order   COVID TEST- N/A   Anesthesia review: review medical history, recent admission for seizures. Pt with hx Parkinson's disease.  I spoke with Lynwood Hope, PA about this patient while at PAT.   Patient denies shortness of breath, fever, cough and chest pain at PAT appointment   All instructions explained to the patient, with a verbal understanding of the material. Patient agrees to go over the instructions while at home for a better understanding.  The opportunity to ask questions was provided.

## 2023-10-11 ENCOUNTER — Inpatient Hospital Stay (HOSPITAL_COMMUNITY)
Admission: RE | Admit: 2023-10-11 | Discharge: 2023-10-12 | DRG: 026 | Disposition: A | Discharge status: Home Health | Attending: Neurosurgery | Admitting: Neurosurgery

## 2023-10-11 ENCOUNTER — Inpatient Hospital Stay (HOSPITAL_COMMUNITY): Payer: Self-pay | Admitting: Physician Assistant

## 2023-10-11 ENCOUNTER — Other Ambulatory Visit: Payer: Self-pay

## 2023-10-11 ENCOUNTER — Inpatient Hospital Stay (HOSPITAL_COMMUNITY)
Admission: RE | Disposition: A | Discharge status: Home / Self Care | Payer: Self-pay | Source: Home / Self Care | Attending: Neurosurgery

## 2023-10-11 ENCOUNTER — Inpatient Hospital Stay (HOSPITAL_COMMUNITY): Admitting: Registered Nurse

## 2023-10-11 ENCOUNTER — Encounter (HOSPITAL_COMMUNITY): Payer: Self-pay | Admitting: Neurosurgery

## 2023-10-11 DIAGNOSIS — Z9889 Other specified postprocedural states: Secondary | ICD-10-CM

## 2023-10-11 DIAGNOSIS — N401 Enlarged prostate with lower urinary tract symptoms: Secondary | ICD-10-CM | POA: Diagnosis present

## 2023-10-11 DIAGNOSIS — Z9841 Cataract extraction status, right eye: Secondary | ICD-10-CM | POA: Diagnosis not present

## 2023-10-11 DIAGNOSIS — K5900 Constipation, unspecified: Secondary | ICD-10-CM | POA: Diagnosis present

## 2023-10-11 DIAGNOSIS — F0280 Dementia in other diseases classified elsewhere without behavioral disturbance: Secondary | ICD-10-CM | POA: Diagnosis present

## 2023-10-11 DIAGNOSIS — Z9049 Acquired absence of other specified parts of digestive tract: Secondary | ICD-10-CM

## 2023-10-11 DIAGNOSIS — Z9089 Acquired absence of other organs: Secondary | ICD-10-CM | POA: Diagnosis not present

## 2023-10-11 DIAGNOSIS — Z79899 Other long term (current) drug therapy: Secondary | ICD-10-CM

## 2023-10-11 DIAGNOSIS — Z960 Presence of urogenital implants: Secondary | ICD-10-CM | POA: Diagnosis present

## 2023-10-11 DIAGNOSIS — Z974 Presence of external hearing-aid: Secondary | ICD-10-CM

## 2023-10-11 DIAGNOSIS — G20A1 Parkinson's disease without dyskinesia, without mention of fluctuations: Secondary | ICD-10-CM | POA: Diagnosis present

## 2023-10-11 DIAGNOSIS — Z9842 Cataract extraction status, left eye: Secondary | ICD-10-CM | POA: Diagnosis not present

## 2023-10-11 DIAGNOSIS — C8339 Primary central nervous system lymphoma: Secondary | ICD-10-CM | POA: Diagnosis present

## 2023-10-11 DIAGNOSIS — G9349 Other encephalopathy: Secondary | ICD-10-CM | POA: Diagnosis present

## 2023-10-11 DIAGNOSIS — G40909 Epilepsy, unspecified, not intractable, without status epilepticus: Secondary | ICD-10-CM | POA: Diagnosis present

## 2023-10-11 DIAGNOSIS — Z888 Allergy status to other drugs, medicaments and biological substances status: Secondary | ICD-10-CM

## 2023-10-11 DIAGNOSIS — Z7952 Long term (current) use of systemic steroids: Secondary | ICD-10-CM

## 2023-10-11 DIAGNOSIS — D496 Neoplasm of unspecified behavior of brain: Secondary | ICD-10-CM | POA: Diagnosis present

## 2023-10-11 DIAGNOSIS — Z8249 Family history of ischemic heart disease and other diseases of the circulatory system: Secondary | ICD-10-CM

## 2023-10-11 DIAGNOSIS — Z8701 Personal history of pneumonia (recurrent): Secondary | ICD-10-CM

## 2023-10-11 DIAGNOSIS — F32A Depression, unspecified: Secondary | ICD-10-CM | POA: Diagnosis present

## 2023-10-11 DIAGNOSIS — Z9079 Acquired absence of other genital organ(s): Secondary | ICD-10-CM

## 2023-10-11 DIAGNOSIS — Z574 Occupational exposure to toxic agents in agriculture: Secondary | ICD-10-CM

## 2023-10-11 DIAGNOSIS — Z87442 Personal history of urinary calculi: Secondary | ICD-10-CM | POA: Diagnosis not present

## 2023-10-11 DIAGNOSIS — Z818 Family history of other mental and behavioral disorders: Secondary | ICD-10-CM | POA: Diagnosis not present

## 2023-10-11 DIAGNOSIS — Z8261 Family history of arthritis: Secondary | ICD-10-CM | POA: Diagnosis not present

## 2023-10-11 DIAGNOSIS — K219 Gastro-esophageal reflux disease without esophagitis: Secondary | ICD-10-CM | POA: Diagnosis present

## 2023-10-11 DIAGNOSIS — Z01812 Encounter for preprocedural laboratory examination: Secondary | ICD-10-CM

## 2023-10-11 DIAGNOSIS — Z85828 Personal history of other malignant neoplasm of skin: Secondary | ICD-10-CM

## 2023-10-11 DIAGNOSIS — Z809 Family history of malignant neoplasm, unspecified: Secondary | ICD-10-CM

## 2023-10-11 HISTORY — PX: APPLICATION OF CRANIAL NAVIGATION: SHX6578

## 2023-10-11 HISTORY — PX: PR DURAL GRAFT SPINAL: 63710

## 2023-10-11 LAB — MRSA NEXT GEN BY PCR, NASAL: MRSA by PCR Next Gen: NOT DETECTED

## 2023-10-11 LAB — ABO/RH: ABO/RH(D): A POS

## 2023-10-11 LAB — GLUCOSE, CAPILLARY
Glucose-Capillary: 133 mg/dL — ABNORMAL HIGH (ref 70–99)
Glucose-Capillary: 133 mg/dL — ABNORMAL HIGH (ref 70–99)

## 2023-10-11 SURGERY — FRAMELESS STEREOTACTIC BIOPSY
Anesthesia: General | Laterality: Left

## 2023-10-11 MED ORDER — ONDANSETRON HCL 4 MG/2ML IJ SOLN: 4.0000 mg | Freq: Once | INTRAMUSCULAR | Status: DC | PRN

## 2023-10-11 MED ORDER — LIDOCAINE-EPINEPHRINE 1 %-1:100000 IJ SOLN
INTRAMUSCULAR | Status: DC | PRN
  Administered 2023-10-11: 3 mL

## 2023-10-11 MED ORDER — CEFAZOLIN SODIUM-DEXTROSE 2-4 GM/100ML-% IV SOLN
2.0000 g | INTRAVENOUS | Status: AC
  Administered 2023-10-11: 2 g via INTRAVENOUS

## 2023-10-11 MED ORDER — LIDOCAINE-EPINEPHRINE 1 %-1:100000 IJ SOLN
INTRAMUSCULAR | Status: AC
  Filled 2023-10-11: qty 1

## 2023-10-11 MED ORDER — ONDANSETRON HCL 4 MG/2ML IJ SOLN
INTRAMUSCULAR | Status: DC | PRN
  Administered 2023-10-11: 4 mg via INTRAVENOUS

## 2023-10-11 MED ORDER — LABETALOL HCL 5 MG/ML IV SOLN
10.0000 mg | INTRAVENOUS | Status: DC | PRN
  Administered 2023-10-12: 10 mg via INTRAVENOUS
  Filled 2023-10-11 (×2): qty 4

## 2023-10-11 MED ORDER — DEXAMETHASONE SODIUM PHOSPHATE 10 MG/ML IJ SOLN
INTRAMUSCULAR | Status: DC | PRN
  Administered 2023-10-11: 8 mg via INTRAVENOUS

## 2023-10-11 MED ORDER — PROPOFOL 10 MG/ML IV BOLUS
INTRAVENOUS | Status: DC | PRN
  Administered 2023-10-11: 50 mg via INTRAVENOUS
  Administered 2023-10-11: 100 mg via INTRAVENOUS

## 2023-10-11 MED ORDER — LIDOCAINE 2% (20 MG/ML) 5 ML SYRINGE
INTRAMUSCULAR | Status: DC | PRN
  Administered 2023-10-11: 100 mg via INTRAVENOUS

## 2023-10-11 MED ORDER — CEFAZOLIN SODIUM-DEXTROSE 2-4 GM/100ML-% IV SOLN
INTRAVENOUS | Status: AC
  Filled 2023-10-11: qty 100

## 2023-10-11 MED ORDER — 0.9 % SODIUM CHLORIDE (POUR BTL) OPTIME
TOPICAL | Status: DC | PRN
  Administered 2023-10-11: 1000 mL

## 2023-10-11 MED ORDER — FENTANYL CITRATE (PF) 100 MCG/2ML IJ SOLN: 25.0000 ug | INTRAMUSCULAR | Status: DC | PRN

## 2023-10-11 MED ORDER — BACITRACIN ZINC 500 UNIT/GM EX OINT
TOPICAL_OINTMENT | CUTANEOUS | Status: DC | PRN
  Administered 2023-10-11: 1 via TOPICAL

## 2023-10-11 MED ORDER — ONDANSETRON HCL 4 MG PO TABS: 4.0000 mg | ORAL_TABLET | ORAL | Status: DC | PRN

## 2023-10-11 MED ORDER — ROCURONIUM BROMIDE 10 MG/ML (PF) SYRINGE
PREFILLED_SYRINGE | INTRAVENOUS | Status: DC | PRN
  Administered 2023-10-11: 70 mg via INTRAVENOUS
  Administered 2023-10-11: 5 mg via INTRAVENOUS

## 2023-10-11 MED ORDER — FENTANYL CITRATE (PF) 250 MCG/5ML IJ SOLN
INTRAMUSCULAR | Status: DC | PRN
  Administered 2023-10-11: 100 ug via INTRAVENOUS

## 2023-10-11 MED ORDER — CARBIDOPA-LEVODOPA ER 50-200 MG PO TBCR
1.0000 | EXTENDED_RELEASE_TABLET | Freq: Every day | ORAL | Status: DC
  Administered 2023-10-11: 1 via ORAL
  Filled 2023-10-11 (×2): qty 1

## 2023-10-11 MED ORDER — ACETAMINOPHEN 650 MG RE SUPP
650.0000 mg | RECTAL | Status: DC | PRN
Start: 2023-10-11 — End: 2023-10-12

## 2023-10-11 MED ORDER — THROMBIN 5000 UNITS EX KIT
PACK | CUTANEOUS | Status: AC
  Filled 2023-10-11: qty 1

## 2023-10-11 MED ORDER — LEVODOPA 42 MG IN CAPS: 1.0000 | ORAL_CAPSULE | RESPIRATORY_TRACT | Status: DC | PRN

## 2023-10-11 MED ORDER — ORAL CARE MOUTH RINSE: 15.0000 mL | Freq: Once | OROMUCOSAL | Status: AC

## 2023-10-11 MED ORDER — PANTOPRAZOLE SODIUM 40 MG PO TBEC
40.0000 mg | DELAYED_RELEASE_TABLET | Freq: Every day | ORAL | Status: DC
  Administered 2023-10-12: 40 mg via ORAL
  Filled 2023-10-11: qty 1

## 2023-10-11 MED ORDER — CHLORHEXIDINE GLUCONATE CLOTH 2 % EX PADS: 6.0000 | MEDICATED_PAD | Freq: Once | CUTANEOUS | Status: DC

## 2023-10-11 MED ORDER — CARBIDOPA-LEVODOPA 25-100 MG PO TABS
2.0000 | ORAL_TABLET | ORAL | Status: DC
  Administered 2023-10-11 – 2023-10-12 (×4): 2 via ORAL
  Filled 2023-10-11 (×6): qty 2

## 2023-10-11 MED ORDER — BUPIVACAINE HCL (PF) 0.5 % IJ SOLN
INTRAMUSCULAR | Status: DC | PRN
  Administered 2023-10-11: 3 mL

## 2023-10-11 MED ORDER — PROPOFOL 500 MG/50ML IV EMUL
INTRAVENOUS | Status: DC | PRN
  Administered 2023-10-11: 150 ug/kg/min via INTRAVENOUS

## 2023-10-11 MED ORDER — OXYCODONE HCL 5 MG/5ML PO SOLN: 5.0000 mg | Freq: Once | ORAL | Status: DC | PRN

## 2023-10-11 MED ORDER — ONDANSETRON HCL 4 MG/2ML IJ SOLN: 4.0000 mg | INTRAMUSCULAR | Status: DC | PRN

## 2023-10-11 MED ORDER — THROMBIN 5000 UNITS EX SOLR: CUTANEOUS | Status: DC | PRN

## 2023-10-11 MED ORDER — SODIUM CHLORIDE 0.9 % IV SOLN: INTRAVENOUS | Status: DC

## 2023-10-11 MED ORDER — ACETAMINOPHEN 10 MG/ML IV SOLN
1000.0000 mg | Freq: Once | INTRAVENOUS | Status: DC | PRN
Start: 2023-10-11 — End: 2023-10-11

## 2023-10-11 MED ORDER — THROMBIN (RECOMBINANT) 5000 UNITS EX SOLR: CUTANEOUS | Status: DC | PRN

## 2023-10-11 MED ORDER — INSULIN ASPART 100 UNIT/ML IJ SOLN: 0.0000 [IU] | Freq: Three times a day (TID) | INTRAMUSCULAR | Status: DC

## 2023-10-11 MED ORDER — BUPIVACAINE HCL (PF) 0.5 % IJ SOLN
INTRAMUSCULAR | Status: AC
  Filled 2023-10-11: qty 30

## 2023-10-11 MED ORDER — DEXAMETHASONE 4 MG PO TABS
4.0000 mg | ORAL_TABLET | Freq: Every day | ORAL | Status: DC
  Administered 2023-10-12: 4 mg via ORAL
  Filled 2023-10-11: qty 1

## 2023-10-11 MED ORDER — FENTANYL CITRATE (PF) 250 MCG/5ML IJ SOLN
INTRAMUSCULAR | Status: AC
  Filled 2023-10-11: qty 5

## 2023-10-11 MED ORDER — CHLORHEXIDINE GLUCONATE 0.12 % MT SOLN
15.0000 mL | Freq: Once | OROMUCOSAL | Status: AC
  Administered 2023-10-11: 15 mL via OROMUCOSAL
  Filled 2023-10-11: qty 15

## 2023-10-11 MED ORDER — HYDROCODONE-ACETAMINOPHEN 5-325 MG PO TABS: 1.0000 | ORAL_TABLET | ORAL | Status: DC | PRN

## 2023-10-11 MED ORDER — LEVETIRACETAM 500 MG PO TABS
500.0000 mg | ORAL_TABLET | Freq: Two times a day (BID) | ORAL | Status: DC
  Administered 2023-10-11 – 2023-10-12 (×3): 500 mg via ORAL
  Filled 2023-10-11 (×3): qty 1

## 2023-10-11 MED ORDER — OXYCODONE HCL 5 MG PO TABS: 5.0000 mg | ORAL_TABLET | Freq: Once | ORAL | Status: DC | PRN

## 2023-10-11 MED ORDER — ACETAMINOPHEN 325 MG PO TABS: 650.0000 mg | ORAL_TABLET | ORAL | Status: DC | PRN

## 2023-10-11 MED ORDER — SUGAMMADEX SODIUM 200 MG/2ML IV SOLN
INTRAVENOUS | Status: DC | PRN
  Administered 2023-10-11: 200 mg via INTRAVENOUS

## 2023-10-11 MED ORDER — SODIUM CHLORIDE 0.9 % IV SOLN
0.0125 ug/kg/min | INTRAVENOUS | Status: AC
  Administered 2023-10-11: .1 ug/kg/min via INTRAVENOUS
  Filled 2023-10-11: qty 2000

## 2023-10-11 MED ORDER — VITAMIN B-12 1000 MCG PO TABS
1000.0000 ug | ORAL_TABLET | Freq: Every day | ORAL | Status: DC
  Administered 2023-10-12: 1000 ug via ORAL
  Filled 2023-10-11 (×2): qty 1

## 2023-10-11 MED ORDER — CHLORHEXIDINE GLUCONATE CLOTH 2 % EX PADS
6.0000 | MEDICATED_PAD | Freq: Every day | CUTANEOUS | Status: DC
  Administered 2023-10-11 – 2023-10-12 (×2): 6 via TOPICAL

## 2023-10-11 MED ORDER — CLEVIDIPINE BUTYRATE 0.5 MG/ML IV EMUL
INTRAVENOUS | Status: DC | PRN
  Administered 2023-10-11: 1 mg/h via INTRAVENOUS

## 2023-10-11 MED ORDER — SODIUM CHLORIDE 0.9 % IV SOLN: INTRAVENOUS | Status: DC | PRN

## 2023-10-11 MED ORDER — BACITRACIN ZINC 500 UNIT/GM EX OINT
TOPICAL_OINTMENT | CUTANEOUS | Status: AC
  Filled 2023-10-11: qty 28.35

## 2023-10-11 SURGICAL SUPPLY — 42 items
BAG COUNTER SPONGE SURGICOUNT (BAG) ×1 IMPLANT
BATTERY IQ STERILE (MISCELLANEOUS) ×1 IMPLANT
BLADE CLIPPER SURG (BLADE) IMPLANT
BNDG ADH 1X3 SHEER STRL LF (GAUZE/BANDAGES/DRESSINGS) IMPLANT
BUR 14 MATCH 3 (BUR) IMPLANT
CANISTER SUCTION 3000ML PPV (SUCTIONS) ×1 IMPLANT
CNTNR URN SCR LID CUP LEK RST (MISCELLANEOUS) ×3 IMPLANT
DRAPE NEUROLOGICAL W/INCISE (DRAPES) ×1 IMPLANT
DRSG TELFA 3X8 NADH STRL (GAUZE/BANDAGES/DRESSINGS) ×1 IMPLANT
DURAPREP 26ML APPLICATOR (WOUND CARE) IMPLANT
ELECTRODE REM PT RTRN 9FT ADLT (ELECTROSURGICAL) ×1 IMPLANT
FEE COVERAGE SUPPORT O-ARM (MISCELLANEOUS) ×1 IMPLANT
FORCEPS BIPOLAR SPETZLER 8 1.0 (NEUROSURGERY SUPPLIES) ×1 IMPLANT
GAUZE 4X4 16PLY ~~LOC~~+RFID DBL (SPONGE) IMPLANT
GLOVE BIOGEL PI IND STRL 7.0 (GLOVE) IMPLANT
GLOVE BIOGEL PI IND STRL 7.5 (GLOVE) ×2 IMPLANT
GLOVE ECLIPSE 7.0 STRL STRAW (GLOVE) ×1 IMPLANT
GLOVE EXAM NITRILE XL STR (GLOVE) IMPLANT
GOWN STRL REUS W/ TWL LRG LVL3 (GOWN DISPOSABLE) IMPLANT
GOWN STRL REUS W/ TWL XL LVL3 (GOWN DISPOSABLE) IMPLANT
GOWN STRL REUS W/TWL 2XL LVL3 (GOWN DISPOSABLE) IMPLANT
KIT BASIN OR (CUSTOM PROCEDURE TRAY) ×1 IMPLANT
KIT GUIDE INT PASS TRAJECTORY (MISCELLANEOUS) IMPLANT
KIT NDL BIOPSY PASSIVE (NEEDLE) IMPLANT
KIT NEEDLE BIOPSY PASSIVE (NEEDLE) ×1 IMPLANT
KIT TRAJECTORY GUIDE EXTERNAL (MISCELLANEOUS) IMPLANT
KIT TURNOVER KIT B (KITS) ×1 IMPLANT
MARKER SPHERE PSV REFLC NDI (MISCELLANEOUS) ×3 IMPLANT
NDL HYPO 25X1 1.5 SAFETY (NEEDLE) ×1 IMPLANT
NEEDLE HYPO 25X1 1.5 SAFETY (NEEDLE) ×1 IMPLANT
NS IRRIG 1000ML POUR BTL (IV SOLUTION) ×1 IMPLANT
PACK CRANIOTOMY CUSTOM (CUSTOM PROCEDURE TRAY) ×1 IMPLANT
PAD ARMBOARD POSITIONER FOAM (MISCELLANEOUS) ×3 IMPLANT
PERFORATOR LRG 14-11MM (BIT) ×1 IMPLANT
PIN MAYFIELD SKULL DISP (PIN) ×1 IMPLANT
SPONGE SURGIFOAM ABS GEL SZ50 (HEMOSTASIS) IMPLANT
STAPLER SKIN PROX 35W (STAPLE) ×1 IMPLANT
SUT VIC AB 3-0 SH 8-18 (SUTURE) ×1 IMPLANT
SYR 5ML LL (SYRINGE) ×2 IMPLANT
TOWEL GREEN STERILE (TOWEL DISPOSABLE) ×1 IMPLANT
TOWEL GREEN STERILE FF (TOWEL DISPOSABLE) ×1 IMPLANT
WATER STERILE IRR 1000ML POUR (IV SOLUTION) ×1 IMPLANT

## 2023-10-11 NOTE — Plan of Care (Signed)
  Problem: Clinical Measurements: Goal: Will remain free from infection Outcome: Progressing Goal: Respiratory complications will improve Outcome: Progressing Goal: Cardiovascular complication will be avoided Outcome: Progressing   Problem: Activity: Goal: Risk for activity intolerance will decrease Outcome: Progressing   Problem: Nutrition: Goal: Adequate nutrition will be maintained Outcome: Progressing   Problem: Coping: Goal: Level of anxiety will decrease Outcome: Progressing   Problem: Elimination: Goal: Will not experience complications related to urinary retention Outcome: Progressing   Problem: Pain Managment: Goal: General experience of comfort will improve and/or be controlled Outcome: Progressing   Problem: Safety: Goal: Ability to remain free from injury will improve Outcome: Progressing   Problem: Skin Integrity: Goal: Risk for impaired skin integrity will decrease Outcome: Progressing   Problem: Education: Goal: Knowledge of the prescribed therapeutic regimen will improve Outcome: Progressing   Problem: Clinical Measurements: Goal: Usual level of consciousness will be regained or maintained. Outcome: Progressing Goal: Neurologic status will improve Outcome: Progressing   Problem: Skin Integrity: Goal: Demonstration of wound healing without infection will improve Outcome: Progressing

## 2023-10-11 NOTE — H&P (Signed)
 Chief Complaint   Tumor  History of Present Illness  [name] [name] is a 78 y.o. 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 After a lengthy discussion with neuro-oncology, he has elected to proceed with biopsy for tissue diagnosis.  Past Medical History   Past Medical History:  Diagnosis Date   Agent orange exposure    Anemia    BPH (benign prostatic hypertrophy)    Cancer (HCC)    basal cell temple   Complication of anesthesia    confusion and combative when waking up from last kidney procedure   Constipation    Depression    Fatigue    d/t Parkinson's   GERD (gastroesophageal reflux disease)    occasional   History of kidney stones    Left ureteral calculus    Lower urinary tract symptoms (LUTS)    Nephrolithiasis    Neuromuscular disorder (HCC)     parkinson's   Orthostatic hypotension    d/t Parkinson's disease   Orthostatic hypotension    Parkinson's disease (HCC)    Peyronie disease    Pneumonia 2018   Renal cyst, left    S/P deep brain stimulator placement    11-29-2012   Seizure (HCC)    pt and wife report that he has had seizures and is now on seizure medicine   Sleep behavior disorder, REM    Wears glasses    Wears hearing aid    BILATERAL-- INTERMITTANT WEARS    Past Surgical History   Past Surgical History:  Procedure Laterality Date   CATARACT EXTRACTION Bilateral    CHOLECYSTECTOMY  12/21/2014   Procedure: LAPAROSCOPIC CHOLECYSTECTOMY;  Surgeon: Lynda Leos, MD;  Location: [facility];  Service: General;;   COLONOSCOPY     CYSTO/  LEFT URETEROSOCPY/   LEFT URETERAL STENT PLACEMENT/  LASER BLADDER STONES AND EXTRACTION  09/25/2010   CYSTOSCOPY WITH INSERTION OF UROLIFT N/A 03/11/2017   Procedure: CYSTOSCOPY WITH INSERTION OF UROLIFT, LITHOPAXY,STONE OBTAINED;  Surgeon: Matilda Senior, MD;  Location: [facility] [place];  Service: Urology;  Laterality: N/A;    CYSTOSCOPY WITH LITHOLAPAXY N/A 09/12/2020   Procedure: CYSTOSCOPY WITH LITHOLAPAXY, LEFT RETROGRADE PYELOGRAMS, LEFT URETEROSCOPY, AND STENT PLACEMENT;  Surgeon: Cam Morene ORN, MD;  Location: [facility];  Service: Urology;  Laterality: N/A;  REQUESTING 2 HRS   CYSTOSCOPY WITH LITHOLAPAXY Right 07/14/2023   Procedure: CYSTOSCOPY, WITH BLADDER CALCULUS LITHOLAPAXY;  Surgeon: Cam Morene ORN, MD;  Location: [facility];  Service: Urology;  Laterality: Right;  CYSTOLITHOLAPAXY, RIGHT URETEROSCOPY, LASER LITHOTRIPSY, STONE EXTRACTION, RIGHT URETERAL STENT PLACEMENT   CYSTOSCOPY WITH RETROGRADE PYELOGRAM, URETEROSCOPY AND STENT PLACEMENT Left 01/06/2013   Procedure: CYSTOSCOPY WITH RETROGRADE PYELOGRAM, URETEROSCOPY AND STENT PLACEMENT;  Surgeon: Thomasine Oiler, MD;  Location: [facility] [place];  Service: Urology;  Laterality: Left;   CYSTOSCOPY WITH RETROGRADE PYELOGRAM, URETEROSCOPY AND STENT PLACEMENT Left 09/11/2014   Procedure: CYSTOSCOPY WITH LEFT RETROGRADE PYELOGRAM, URETEROSCOPY AND STENT PLACEMENT;  Surgeon: Oliva Oiler, MD;  Location: [facility];  Service: Urology;  Laterality: Left;   CYSTOSCOPY/RETROGRADE/URETEROSCOPY/STONE EXTRACTION WITH BASKET Right 07/14/2023   Procedure: CYSTOSCOPY, WITH CALCULUS REMOVAL USING BASKET;  Surgeon: Cam Morene ORN, MD;  [facility];  Service: Urology;  Laterality: Right;   CYSTOSCOPY/URETEROSCOPY/HOLMIUM LASER/STENT PLACEMENT Right 07/14/2023   Procedure: CYSTOSCOPY/URETEROSCOPY/HOLMIUM LASER/STENT PLACEMENT;  Surgeon: Cam Morene ORN, MD;  [facility];  Service: Urology;  Laterality: Right;   DEEP  BRAIN STIMULATOR PLACEMENT  11/29/2012   genertor device at left pectoral area-   EXTRACORPOREAL SHOCK WAVE LITHOTRIPSY Left 10-11-2010  &  01-05-2011   HOLMIUM LASER APPLICATION Left 01/06/2013   Procedure: HOLMIUM LASER APPLICATION;  Surgeon: Thomasine Oiler, MD;  Location: [facility] [place];   Service: Urology;  Laterality: Left;   HOLMIUM LASER APPLICATION Left 09/11/2014   Procedure: HOLMIUM LASER APPLICATION;  Surgeon: Oliva Oiler, MD;  Location: [facility];  Service: Urology;  Laterality: Left;   HOLMIUM LASER APPLICATION N/A 03/11/2017   Procedure: HOLMIUM LASER APPLICATION;  Surgeon: Matilda Senior, MD;  Location: [facility];  Service: Urology;  Laterality: N/A;   NEGATIVE SLEEP STUDY  2013  per pt   NEPHROLITHOTOMY Right 08/13/2022   Procedure: RIGHT NEPHROLITHOTOMY PERCUTANEOUS;  Surgeon: Cam Morene ORN, MD;  [facility];  Service: Urology;  Laterality: Right;  210 MINUTES   SP PERC NEPHROSTOMY Left 12/16/2012   STONE EXTRACTION WITH BASKET Left 01/06/2013   Procedure: STONE EXTRACTION WITH BASKET;  Surgeon: Thomasine Oiler, MD;  Location: [facility];  Service: Urology;  Laterality: Left;   TONSILLECTOMY     TRANSURETHRAL RESECTION OF PROSTATE N/A 09/12/2020   Procedure: TRANSURETHRAL RESECTION OF THE PROSTATE (TURP);  Surgeon: Cam Morene ORN, MD;  Location: [facility];  Service: Urology;  Laterality: N/A;    Social History   Social History   Tobacco Use   Smoking status: Never   Smokeless tobacco: Never  Vaping Use   Vaping status: Never Used  Substance Use Topics   Alcohol  use: Not Currently    Comment: rarely beer   Drug use: No    Medications   Prior to Admission medications   Medication Sig Start Date End Date Taking? Authorizing Provider  carbidopa -levodopa  (SINEMET  CR) 50-200 MG tablet Take 1 tablet by mouth at bedtime.   Yes [provider]  carbidopa -levodopa  (SINEMET  IR) 25-100 MG tablet 2-2.5 tablets 4 times per day at 6am/10:30am/2pm/6pm Patient taking differently: Take 2 tablets by mouth 4 (four) times daily. 6am/10:30am/2pm/6pm 09/20/23  Yes Johnson, Clanford L, MD  cyanocobalamin  1000 MCG tablet Take 1 tablet (1,000 mcg total) by mouth daily. 07/11/23  Yes  Rizwan, Saima, MD  dexamethasone  (DECADRON ) 4 MG tablet Take 4 mg by mouth daily.   Yes [provider]  levETIRAcetam  (KEPPRA ) 500 MG tablet Take 1 tablet (500 mg total) by mouth 2 (two) times daily. 09/25/23  Yes Elgergawy, Brayton RAMAN, MD  pantoprazole  (PROTONIX ) 40 MG tablet Take 1 tablet (40 mg total) by mouth daily. 09/26/23  Yes Elgergawy, Brayton RAMAN, MD  fludrocortisone  (FLORINEF ) 0.1 MG tablet Take 2 tablets (0.2 mg total) by mouth 3 (three) times daily with meals. Patient not taking: Reported on 09/28/2023 07/10/23   Rizwan, Saima, MD  Levodopa  (INBRIJA ) 42 MG CAPS We are going to start inbrija .  Remember that TWO capsules is ONE dosage (never inhale just one capsule).  You can inhale the capsules as needed up to 5 times per day, separated by 2 hour intervals. Patient taking differently: Place 1 capsule into inhaler and inhale as needed (can't walk, muscle rigidedness). We are going to start inbrija .  Remember that TWO capsules is ONE dosage (never inhale just one capsule).  You can inhale the capsules as needed up to 5 times per day, separated by 2 hour intervals. 08/30/23   Tat, Asberry RAMAN, DO  midodrine  (PROAMATINE ) 5 MG tablet Take 1 tablet (5 mg total) by  mouth 3 (three) times daily with meals. Patient not taking: Reported on 09/28/2023 08/30/23   Tat, Asberry RAMAN, DO    Allergies   Allergies  Allergen Reactions   Alfuzosin  Other (See Comments)    Hypotension    Rivastigmine Tartrate Nausea And Vomiting     Can tolerate the patch but can not tolerate the pills   Tamsulosin Other (See Comments)    Severe joint pain    Review of Systems  ROS  Neurologic Exam  Awake, alert, oriented Speech fluent, appropriate, hypophonic CN grossly intact MAE, generalized weakness Sensation grossly intact to LT   Impression  - 78 y.o. 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 We have also reviewed in detail the risks, benefits, and alternatives to the procedure. All questions were answered and Jolee ONEIDA Pesa provided informed consent to proceed.  Gerldine Maizes, MD [facility]

## 2023-10-11 NOTE — Consult Note (Signed)
 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 Case unremarkable. Admitted to ICU post op as per pre op plan  PCCM consulted in this setting   Pertinent  Medical History  Parkinsons Seizure  BPH  Orthostatic hypotension  Significant Hospital Events: Including procedures, antibiotic start and stop dates in addition to other pertinent events   9/8 L brain biopsy, ICU admission, PCCM consult  Interim History / Subjective:  POD 0 brain biopsy   Asking if this is a dream   Objective    Blood pressure (!) 105/59, pulse 74, temperature (!) 97.4 F (36.3 C), temperature source Oral, resp. rate 13, height 5' 10 (1.778 m), weight 70 kg, SpO2 97%.        Intake/Output Summary (Last 24 hours) at 10/11/2023 1152 Last data filed at 10/11/2023 1001 Gross per 24 hour  Intake 750 ml  Output 110 ml  Net 640 ml   Filed Weights   10/11/23 0558  Weight: 70 kg    Examination: General: chronically ill M NAD  Neuro: BLE tremor. Awake, following commands, 5/5 strength. Disoriented.  HENT: Surgical site is clean. Anicteric sclera. Pink mmm  Lungs: even unlabored on RA  Cardiovascular: rr cap refill brisk  Abdomen: soft  Extremities: no obvious acute joint deformity  GU: condom catheter   Resolved problem list   Assessment and Plan   L brain mass s/p stereotactic brain biopsy under GA Acute encephalopathy, presumed emergence delirium  -has hx of emergence delirium taking multiple days to fully clear Parkinsons s/p DBS Seizure  Hx orthostatic hypotension  P -post op per NSGY, follow path  -DBS management/considerations per NSGY -delirium precautions. Discussed w family  re-orientation strategies  -adv diet as tolerated (family notes he is vegetarian) -resume hope keppra  -resume home sinemet   -cont decadron   -multimodal analgesia, hopefully can be pretty opiate sparing  -home meds reviewed -- at some point was Rx florinef  and midodrine  but reported not taking PTA so are not ordered. If we run into low BP issues, would resume.   Labs   CBC: No results for input(s): WBC, NEUTROABS, HGB, HCT, MCV, PLT in the last 168 hours.  Basic Metabolic Panel: No results for input(s): NA, K, CL, CO2, GLUCOSE, BUN, CREATININE, CALCIUM , MG, PHOS in the last 168 hours. GFR: Estimated Creatinine Clearance: 61.8 mL/min (by C-G formula based on SCr of 0.96 mg/dL). No results for input(s): PROCALCITON, WBC, LATICACIDVEN in the last 168 hours.  Liver Function Tests: No results for input(s): AST, ALT, ALKPHOS, BILITOT, PROT, ALBUMIN in the last 168 hours. No results for input(s): LIPASE, AMYLASE in the last 168 hours. No results for input(s): AMMONIA in the last 168 hours.  ABG No results found for: PHART, PCO2ART, PO2ART, HCO3, TCO2, ACIDBASEDEF, O2SAT   Coagulation Profile: No results for input(s): INR, PROTIME in the last 168 hours.  Cardiac Enzymes: No results for input(s): CKTOTAL, CKMB, CKMBINDEX, TROPONINI in the last 168 hours.  HbA1C: Hgb A1c MFr Bld  Date/Time Value Ref Range Status  09/17/2023 04:54 AM 5.0 4.8 - 5.6 % Final    Comment:    (NOTE) Diagnosis of Diabetes The following HbA1c ranges recommended by  the American Diabetes Association (ADA) may be used as an aid in the diagnosis of diabetes mellitus.  Hemoglobin             Suggested A1C NGSP%              Diagnosis  <5.7                   Non Diabetic  5.7-6.4                Pre-Diabetic  >6.4                   Diabetic  <7.0                   Glycemic control for                       adults with  diabetes.      CBG: No results for input(s): GLUCAP in the last 168 hours.  Review of Systems:   Limited due to encephalopathy  Denies pain Endorses thirst hunger + confusion   Past Medical History:  He,  has a past medical history of Agent orange exposure, Anemia, BPH (benign prostatic hypertrophy), Cancer (HCC), Complication of anesthesia, Constipation, Depression, Fatigue, GERD (gastroesophageal reflux disease), History of kidney stones, Left ureteral calculus, Lower urinary tract symptoms (LUTS), Nephrolithiasis, Neuromuscular disorder (HCC), Orthostatic hypotension, Orthostatic hypotension, Parkinson's disease (HCC), Peyronie disease, Pneumonia (2018), Renal cyst, left, S/P deep brain stimulator placement, Seizure (HCC), Sleep behavior disorder, REM, Wears glasses, and Wears hearing aid.   Surgical History:   Past Surgical History:  Procedure Laterality Date   CATARACT EXTRACTION Bilateral    CHOLECYSTECTOMY  12/21/2014   Procedure: LAPAROSCOPIC CHOLECYSTECTOMY;  Surgeon: Lynda Leos, MD;  Location: [facility];  Service: General;;   COLONOSCOPY     CYSTO/  LEFT URETEROSOCPY/   LEFT URETERAL STENT PLACEMENT/  LASER BLADDER STONES AND EXTRACTION  09/25/2010   CYSTOSCOPY WITH INSERTION OF UROLIFT N/A 03/11/2017   Procedure: CYSTOSCOPY WITH INSERTION OF UROLIFT, LITHOPAXY,STONE OBTAINED;  Surgeon: Matilda Senior, MD;  Location: [facility];  Service: Urology;  Laterality: N/A;   CYSTOSCOPY WITH LITHOLAPAXY N/A 09/12/2020   Procedure: CYSTOSCOPY WITH LITHOLAPAXY, LEFT RETROGRADE PYELOGRAMS, LEFT URETEROSCOPY, AND STENT PLACEMENT;  Surgeon: Cam Morene ORN, MD;  [facility];  Service: Urology;  Laterality: N/A;  REQUESTING 2 HRS   CYSTOSCOPY WITH LITHOLAPAXY Right 07/14/2023   Procedure: CYSTOSCOPY, WITH BLADDER CALCULUS LITHOLAPAXY;  Surgeon: Cam Morene ORN, MD;  [facility];  Service: Urology;  Laterality: Right;   CYSTOLITHOLAPAXY, RIGHT URETEROSCOPY, LASER LITHOTRIPSY, STONE EXTRACTION, RIGHT URETERAL STENT PLACEMENT   CYSTOSCOPY WITH RETROGRADE PYELOGRAM, URETEROSCOPY AND STENT PLACEMENT Left 01/06/2013   Procedure: CYSTOSCOPY WITH RETROGRADE PYELOGRAM, URETEROSCOPY AND STENT PLACEMENT;  Surgeon: Thomasine Oiler, MD;  Location: [facility];  Service: Urology;  Laterality: Left;   CYSTOSCOPY WITH RETROGRADE PYELOGRAM, URETEROSCOPY AND STENT PLACEMENT Left 09/11/2014   Procedure: CYSTOSCOPY WITH LEFT RETROGRADE PYELOGRAM, URETEROSCOPY AND STENT PLACEMENT;  Surgeon: Oliva Oiler, MD;  Location: [facility];  Service: Urology;  Laterality: Left;   CYSTOSCOPY/RETROGRADE/URETEROSCOPY/STONE EXTRACTION WITH BASKET Right 07/14/2023   Procedure: CYSTOSCOPY, WITH CALCULUS REMOVAL USING BASKET;  Surgeon: Cam Morene ORN, MD;  [facility];  Service: Urology;  Laterality: Right;   CYSTOSCOPY/URETEROSCOPY/HOLMIUM LASER/STENT PLACEMENT Right 07/14/2023   Procedure: CYSTOSCOPY/URETEROSCOPY/HOLMIUM LASER/STENT PLACEMENT;  Surgeon: Cam Morene ORN, MD;  [facility];  Service: Urology;  Laterality: Right;   DEEP BRAIN STIMULATOR PLACEMENT  11/29/2012   genertor device at left pectoral area-   EXTRACORPOREAL SHOCK WAVE LITHOTRIPSY Left 10-11-2010  &  01-05-2011   HOLMIUM LASER APPLICATION Left 01/06/2013   Procedure: HOLMIUM LASER APPLICATION;  Surgeon: Thomasine Oiler, MD;  Location: [facility];  Service: Urology;  Laterality: Left;   HOLMIUM LASER APPLICATION Left 09/11/2014   Procedure: HOLMIUM LASER APPLICATION;  Surgeon: Oliva Oiler, MD;  Location: [facility];  Service: Urology;  Laterality: Left;   HOLMIUM LASER APPLICATION N/A 03/11/2017   Procedure: HOLMIUM LASER APPLICATION;  Surgeon: Matilda Senior, MD;  Location: [facility];  Service: Urology;  Laterality: N/A;   NEGATIVE SLEEP STUDY  2013  per pt   NEPHROLITHOTOMY Right  08/13/2022   Procedure: RIGHT NEPHROLITHOTOMY PERCUTANEOUS;  Surgeon: Cam Morene ORN, MD;  Location[facility];  Service: Urology;  Laterality: Right;  210 MINUTES   SP PERC NEPHROSTOMY Left 12/16/2012   STONE EXTRACTION WITH BASKET Left 01/06/2013   Procedure: STONE EXTRACTION WITH BASKET;  Surgeon: Thomasine Oiler, MD;  Location: [facility] [place];  Service: Urology;  Laterality: Left;   TONSILLECTOMY     TRANSURETHRAL RESECTION OF PROSTATE N/A 09/12/2020   Procedure: TRANSURETHRAL RESECTION OF THE PROSTATE (TURP);  Surgeon: Cam Morene ORN, MD;  Location: [facility];  Service: Urology;  Laterality: N/A;     Social History:   reports that he has never smoked. He has never used smokeless tobacco. He reports that he does not currently use alcohol . He reports that he does not use drugs.   Family History:  His family history includes Arthritis/Rheumatoid in his sister; Cancer in his father; Dementia in his father; Heart attack in his sister; Hypertension in his mother.   Allergies Allergies  Allergen Reactions   Alfuzosin  Other (See Comments)    Hypotension    Rivastigmine Tartrate Nausea And Vomiting     Can tolerate the patch but can not tolerate the pills   Tamsulosin Other (See Comments)    Severe joint pain     Home Medications  Prior to Admission medications   Medication Sig Start Date End Date Taking? Authorizing Provider  carbidopa -levodopa  (SINEMET  CR) 50-200 MG tablet Take 1 tablet by mouth at bedtime.   Yes [provider]  carbidopa -levodopa  (SINEMET  IR) 25-100 MG tablet 2-2.5 tablets 4 times per day at 6am/10:30am/2pm/6pm Patient taking differently: Take 2 tablets by mouth 4 (four) times daily. 6am/10:30am/2pm/6pm 09/20/23  Yes Johnson, Clanford L, MD  cyanocobalamin  1000 MCG tablet Take 1 tablet (1,000 mcg total) by mouth daily. 07/11/23  Yes Rizwan, Saima, MD  dexamethasone  (DECADRON ) 4 MG tablet Take 4 mg by mouth daily.   Yes  [provider]  levETIRAcetam  (KEPPRA ) 500 MG tablet Take 1 tablet (500 mg total) by mouth 2 (two) times daily. 09/25/23  Yes Elgergawy, Brayton RAMAN, MD  pantoprazole  (PROTONIX ) 40 MG tablet Take 1 tablet (40 mg total) by mouth daily. 09/26/23  Yes Elgergawy, Brayton RAMAN, MD  fludrocortisone  (FLORINEF ) 0.1 MG tablet Take 2 tablets (0.2 mg total) by mouth 3 (three) times daily with meals. Patient not taking: Reported on 09/28/2023 07/10/23   Rizwan, Saima, MD  Levodopa  (INBRIJA ) 42 MG CAPS We are going to start inbrija .  Remember that TWO capsules is ONE dosage (never inhale just one capsule).  You can inhale the capsules as needed up to 5 times per day, separated by 2 hour intervals.  Patient taking differently: Place 1 capsule into inhaler and inhale as needed (can't walk, muscle rigidedness). We are going to start inbrija .  Remember that TWO capsules is ONE dosage (never inhale just one capsule).  You can inhale the capsules as needed up to 5 times per day, separated by 2 hour intervals. 08/30/23   Tat, Asberry RAMAN, DO  midodrine  (PROAMATINE ) 5 MG tablet Take 1 tablet (5 mg total) by mouth 3 (three) times daily with meals. Patient not taking: Reported on 09/28/2023 08/30/23   Tat, Asberry RAMAN, DO     Critical care time: n/a      Low MDM    Ronnald Gave MSN, AGACNP-BC [facility] Pulmonary/Critical Care Medicine Amion for pager  10/11/2023, 11:52 AM

## 2023-10-11 NOTE — Anesthesia Preprocedure Evaluation (Signed)
 Anesthesia Evaluation  Patient identified by MRN, date of birth, ID band Patient awake    Reviewed: Allergy & Precautions, NPO status , Patient's Chart, lab work & pertinent test results, reviewed documented beta blocker date and time   History of Anesthesia Complications (+) Emergence Delirium and history of anesthetic complications  Airway Mallampati: II  TM Distance: >3 FB     Dental no notable dental hx.    Pulmonary pneumonia, neg COPD   Pulmonary exam normal        Cardiovascular (-) angina (-) CAD, (-) Past MI and (-) Cardiac Stents  Rhythm:Regular Rate:Normal     Neuro/Psych Seizures -, Well Controlled,  PSYCHIATRIC DISORDERS  Depression     Neuromuscular disease    GI/Hepatic ,GERD  Medicated,,(+) neg Cirrhosis        Endo/Other    Renal/GU Renal disease     Musculoskeletal   Abdominal   Peds  Hematology  (+) Blood dyscrasia, anemia   Anesthesia Other Findings   Reproductive/Obstetrics                              Anesthesia Physical Anesthesia Plan  ASA: 3  Anesthesia Plan: General   Post-op Pain Management:    Induction: Intravenous  PONV Risk Score and Plan: 2 and Ondansetron  and TIVA  Airway Management Planned: Oral ETT  Additional Equipment:   Intra-op Plan:   Post-operative Plan: Extubation in OR  Informed Consent: I have reviewed the patients History and Physical, chart, labs and discussed the procedure including the risks, benefits and alternatives for the proposed anesthesia with the patient or authorized representative who has indicated his/her understanding and acceptance.     Dental advisory given  Plan Discussed with: CRNA  Anesthesia Plan Comments:         Anesthesia Quick Evaluation

## 2023-10-11 NOTE — Anesthesia Procedure Notes (Signed)
 Arterial Line Insertion Start/End9/09/2023 7:15 AM, 10/11/2023 7:30 AM Performed by: CRNA  Patient location: Pre-op. Preanesthetic checklist: patient identified, IV checked, site marked, risks and benefits discussed, surgical consent, monitors and equipment checked, pre-op evaluation, timeout performed and anesthesia consent Lidocaine  1% used for infiltration radial was placed Catheter size: 20 G Hand hygiene performed  and maximum sterile barriers used   Attempts: 1 Procedure performed without using ultrasound guided technique. Following insertion, dressing applied. Post procedure assessment: normal and unchanged  Patient tolerated the procedure well with no immediate complications.

## 2023-10-11 NOTE — Transfer of Care (Signed)
 Immediate Anesthesia Transfer of Care Note  Patient: [name] [name]  Procedure(s) Performed: FRAMELESS STEREOTACTIC BIOPSY (Left) COMPUTER-ASSISTED NAVIGATION, FOR CRANIAL PROCEDURE (Left)  Patient Location: PACU  Anesthesia Type:General  Level of Consciousness: drowsy, patient cooperative, and responds to stimulation  Airway & Oxygen  Therapy: Patient Spontanous Breathing and Patient connected to face mask oxygen   Post-op Assessment: Report given to RN and Post -op Vital signs reviewed and stable  Post vital signs: Reviewed and stable  Last Vitals:  Vitals Value Taken Time  BP 144/83 10/11/23 09:57  Temp    Pulse 65 10/11/23 10:00  Resp 19 10/11/23 10:01  SpO2 100 % 10/11/23 10:00  Vitals shown include unfiled device data.  Last Pain:  Vitals:   10/11/23 0617  TempSrc:   PainSc: 0-No pain      Patients Stated Pain Goal: 0 (10/11/23 0617)  Complications: No notable events documented.

## 2023-10-11 NOTE — Op Note (Signed)
  NEUROSURGERY OPERATIVE NOTE   PREOP DIAGNOSIS:   1. Brain tumor   POSTOP DIAGNOSIS: Same  PROCEDURE: 1. Stereotactic left frontal brain biopsy  SURGEON: Dr. Gerldine Maizes, MD  ASSISTANT: None  ANESTHESIA: General Endotracheal  EBL: Minimal  SPECIMENS: tumor for permanent pathology  DRAINS: None  COMPLICATIONS: None immediate  CONDITION: Hemdynamically stable to PACU  HISTORY: [name] [name] is a 78 y.o. 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  After lengthy discussion with our neuro oncologist, patient and family have elected to proceed with stereotactic biopsy.  The risks, benefits, and alternatives to the procedure as well as expected postprocedural course were all reviewed in detail with the patient and his family.  After all their questions were answered informed consent was obtained and witnessed.  PROCEDURE IN DETAIL: The patient was brought to the operating room. After induction of general anesthesia, the patient was positioned on the operative table in the [place] head holder in the supine position.  Care was taken to avoid the previously placed DBS leads.  All pressure points were meticulously padded.  Utilizing the preoperative stereotactic MRI scan, surface markers were co-registered until an accuracy of approximately 3.5 mm was achieved due to significant motion in the preoperative MRI scan. The stereotactic scan was also used to identify a safe location for biopsy of the lesion anterior to the primary motor area, posterior and superficial to the sylvian fissure. Trajectory to access this lesion with the biopsy needle was then planned out and entry point was marked on the skin.  The area was then clipped prepped and draped in usual sterile fashion.    After timeout was conducted, skin incision was infiltrated with local anesthetic with epinephrine . Skin incision was then  made sharply, and Bovie electrocautery was used to dissect down to the periosteum which was elevated. Self-retaining retractor was then placed.  Drill was used to create a bur hole which was expanded using rongeurs. The dura was then coagulated, hemostasis was achieved on the pial surface of the brain which was coagulated and incised. The Vertex arm was then attached to the Mayfield head holder, and was positioned with acceptable accuracy <9mm.   Biopsy needle was then introduced, and samples taken and sent for permanent pathology. Biopsy needle was then removed, hemostasis was again confirmed. The wound was irrigated with copious amounts of normal saline irrigation. The bur hole was filled with Gelfoam and a bur hole cover placed and secured with titanium screws. The wound is then closed with interrupted 3-0 Vicryl stitches, and the skin closed with standard surgical skin staples. The patient was then transferred to the stretcher, Mayfield head holder removed, was extubated, and taken to the postanesthesia care unit in stable hemodynamic condition. At the end of the case all sponge, needle, instrument, and cottonoid counts were correct.   Gerldine Maizes, MD [facility]

## 2023-10-11 NOTE — Anesthesia Procedure Notes (Signed)
 Procedure Name: Intubation Date/Time: 10/11/2023 8:23 AM  Performed by: Virgil Ee, CRNAPre-anesthesia Checklist: Patient identified, Patient being monitored, Timeout performed, Emergency Drugs available and Suction available Patient Re-evaluated:Patient Re-evaluated prior to induction Oxygen  Delivery Method: Circle system utilized Preoxygenation: Pre-oxygenation with 100% oxygen  Induction Type: IV induction Ventilation: Mask ventilation without difficulty Laryngoscope Size: Mac and 4 Grade View: Grade II Tube type: Oral Tube size: 7.5 mm Number of attempts: 1 Airway Equipment and Method: Stylet Placement Confirmation: ETT inserted through vocal cords under direct vision, positive ETCO2 and breath sounds checked- equal and bilateral Secured at: 23 cm Tube secured with: Tape Dental Injury: Teeth and Oropharynx as per pre-operative assessment

## 2023-10-11 NOTE — Progress Notes (Signed)
 Patient has a deep brain stimulator. Anesthesia is aware. [employer] rep- Candace number is [phone] or [phone].

## 2023-10-12 ENCOUNTER — Encounter (HOSPITAL_COMMUNITY): Payer: Self-pay | Admitting: Neurosurgery

## 2023-10-12 DIAGNOSIS — D496 Neoplasm of unspecified behavior of brain: Secondary | ICD-10-CM | POA: Diagnosis not present

## 2023-10-12 LAB — GLUCOSE, CAPILLARY: Glucose-Capillary: 128 mg/dL — ABNORMAL HIGH (ref 70–99)

## 2023-10-12 MED ORDER — HYDROCODONE-ACETAMINOPHEN 5-325 MG PO TABS: 1.0000 | ORAL_TABLET | Freq: Four times a day (QID) | ORAL | 0 refills | Status: AC | PRN

## 2023-10-12 MED FILL — Thrombin For Soln 5000 Unit: CUTANEOUS | Qty: 2 | Status: AC

## 2023-10-12 NOTE — Progress Notes (Signed)
 Physical Therapy Quick Note  PT has completed initial evaluation.    Overall, patient at contact guard assistance level.   PT Follow up recommended: Home Health PT Equipment recommended:  None recommended Complete evaluation note to follow.     Bernardino JINNY Ruth, PT, DPT Acute Rehabilitation Office [phone]

## 2023-10-12 NOTE — Anesthesia Postprocedure Evaluation (Signed)
 Anesthesia Post Note  Patient: [name] [name]  Procedure(s) Performed: FRAMELESS STEREOTACTIC BIOPSY (Left) COMPUTER-ASSISTED NAVIGATION, FOR CRANIAL PROCEDURE (Left)     Patient location during evaluation: PACU Anesthesia Type: General Level of consciousness: awake and lethargic Pain management: pain level controlled Vital Signs Assessment: post-procedure vital signs reviewed and stable Respiratory status: spontaneous breathing, nonlabored ventilation, respiratory function stable and patient connected to nasal cannula oxygen  Cardiovascular status: blood pressure returned to baseline and stable Postop Assessment: no apparent nausea or vomiting Anesthetic complications: no   No notable events documented.         Lynwood MARLA Cornea

## 2023-10-12 NOTE — Progress Notes (Signed)
  NEUROSURGERY PROGRESS NOTE   Pt seen and examined. No issues overnight. No new complaints this am.  EXAM: Temp:  [97.4 F (36.3 C)-99.2 F (37.3 C)] 97.6 F (36.4 C) (09/09 0800) Pulse Rate:  [54-79] 77 (09/09 0800) Resp:  [0-24] 15 (09/09 0800) BP: (94-168)/(58-110) 130/70 (09/09 0800) SpO2:  [95 %-100 %] 98 % (09/09 0800) Arterial Line BP: (135-196)/(53-77) 155/61 (09/08 1100) Intake/Output      09/08 0701 09/09 0700 09/09 0701 09/10 0700   P.O. 480    I.V. (mL/kg) 650 (9.3)    IV Piggyback 100    Total Intake(mL/kg) 1230 (17.6)    Urine (mL/kg/hr) 1950 (1.2)    Blood 10    Total Output 1960    Net -730          Awake, alert, oriented Speech fluent, hypophonic MAE good strength Wound c/d/i  LABS: Lab Results  Component Value Date   CREATININE 0.96 09/25/2023   BUN 33 (H) 09/25/2023   NA 134 (L) 09/25/2023   K 4.6 09/25/2023   CL 108 09/25/2023   CO2 25 09/25/2023   Lab Results  Component Value Date   WBC 14.6 (H) 09/25/2023   HGB 13.0 09/25/2023   HCT 37.5 (L) 09/25/2023   MCV 93.8 09/25/2023   PLT 370 09/25/2023    IMPRESSION: - 78 y.o. male POD#1 left frontal stereotactic biopsy, doing well - Baseline Parkinson's dementia  PLAN: - OOB this am - d/c home   Gerldine Maizes, MD [facility]

## 2023-10-12 NOTE — Discharge Summary (Signed)
 Physician Discharge Summary  Patient ID: [name] [name] MRN: [id number] DOB/AGE: 07/03/1945 78 y.o.  Admit date: 10/11/2023 Discharge date: 10/12/2023  Admission Diagnoses:  Left frontotemporal brain tumor  Discharge Diagnoses:  Same Principal Problem:   Brain tumor [facility])   Discharged Condition: Stable  Hospital Course:  [name] [name] is a 78 y.o. male admitted after uncomplicated stereotactic left frontal biopsy. He was at baseline postop monitored in the ICU. He was ambulating with assistive device at baseline and was discharged home in stable condition.  Treatments: Surgery - Stereotactic left frontal biopsy  Discharge Exam: Blood pressure 130/70, pulse 77, temperature 97.6 F (36.4 C), temperature source Axillary, resp. rate 15, height 5' 10 (1.778 m), weight 70 kg, SpO2 98%. Awake, alert, oriented Speech fluent, appropriate CN grossly intact 5/5 BUE/BLE Wound c/d/i  Disposition: Discharge disposition: 01-Home or Self Care       Discharge Instructions     Call MD for:  redness, tenderness, or signs of infection (pain, swelling, redness, odor or green/yellow discharge around incision site)   Complete by: As directed    Call MD for:  temperature >100.4   Complete by: As directed    Diet - low sodium heart healthy   Complete by: As directed    Discharge instructions   Complete by: As directed    Walk at home as much as possible, at least 4 times / day   Increase activity slowly   Complete by: As directed    Lifting restrictions   Complete by: As directed    No lifting > 10 lbs   May shower / Bathe   Complete by: As directed    48 hours after surgery   May walk up steps   Complete by: As directed    No dressing needed   Complete by: As directed    Other Restrictions   Complete by: As directed    No bending/twisting at waist      Allergies as of 10/12/2023       Reactions   Alfuzosin  Other (See Comments)   Hypotension    Rivastigmine Tartrate  Nausea And Vomiting    Can tolerate the patch but can not tolerate the pills   Tamsulosin Other (See Comments)   Severe joint pain        Medication List     TAKE these medications    carbidopa -levodopa  50-200 MG tablet Commonly known as: SINEMET  CR Take 1 tablet by mouth at bedtime. What changed: Another medication with the same name was changed. Make sure you understand how and when to take each.   carbidopa -levodopa  25-100 MG tablet Commonly known as: SINEMET  IR 2-2.5 tablets 4 times per day at 6am/10:30am/2pm/6pm What changed:  how much to take how to take this when to take this additional instructions   cyanocobalamin  1000 MCG tablet Take 1 tablet (1,000 mcg total) by mouth daily.   dexamethasone  4 MG tablet Commonly known as: DECADRON  Take 4 mg by mouth daily.   fludrocortisone  0.1 MG tablet Commonly known as: FLORINEF  Take 2 tablets (0.2 mg total) by mouth 3 (three) times daily with meals.   HYDROcodone -acetaminophen  5-325 MG tablet Commonly known as: NORCO/VICODIN Take 1 tablet by mouth every 6 (six) hours as needed for up to 3 days for moderate pain (pain score 4-6).   Inbrija  42 MG Caps Generic drug: Levodopa  We are going to start inbrija .  Remember that TWO capsules is ONE dosage (never inhale just one capsule).  You  can inhale the capsules as needed up to 5 times per day, separated by 2 hour intervals. What changed:  how much to take how to take this when to take this reasons to take this   levETIRAcetam  500 MG tablet Commonly known as: Keppra  Take 1 tablet (500 mg total) by mouth 2 (two) times daily.   midodrine  5 MG tablet Commonly known as: PROAMATINE  Take 1 tablet (5 mg total) by mouth 3 (three) times daily with meals.   pantoprazole  40 MG tablet Commonly known as: PROTONIX  Take 1 tablet (40 mg total) by mouth daily.               Discharge Care Instructions  (From admission, onward)           Start     Ordered   10/12/23  0000  No dressing needed        10/12/23 9083            Follow-up Information     Lanis Pupa, MD Follow up in 2 week(s).   Specialty: Neurosurgery Why: For staple removal Contact information: [address] [place] [address] [phone]                 Signed: Pupa JAYSON Lanis 10/12/2023, 9:16 AM

## 2023-10-12 NOTE — Evaluation (Signed)
 Occupational Therapy Evaluation Patient Details Name: [name] [name] MRN: [id number] DOB: [birth year] Today's Date: 10/12/2023   History of Present Illness   78 y.o. male presents to [facility] hospital on 10/11/2023 for L brain mass biopsy. PMHx: brain mass, Parkinson's, seizures, BPH.     Clinical Impressions [name] [name] was evaluated s/p the above admission list. He has assist for ADLs and mobility at baseline. Upon evaluation the pt was limited by baseline PD symptoms, generalized weakness and limited activity tolerance. Overall he needs min A for most aspects of ADLs and mobility. Pt's wife expressed concern with his ability to use his dominant RUE during functional tasks, pt and wife educated to try a 1-2lb weight to reduce tremor. Pt will benefit from continued acute OT services and HHOT.      If plan is discharge home, recommend the following:   A lot of help with walking and/or transfers;A lot of help with bathing/dressing/bathroom;Assistance with cooking/housework;Assistance with feeding;Direct supervision/assist for medications management;Assist for transportation;Help with stairs or ramp for entrance     Functional Status Assessment   Patient has had a recent decline in their functional status and demonstrates the ability to make significant improvements in function in a reasonable and predictable amount of time.     Equipment Recommendations   None recommended by OT      Precautions/Restrictions   Precautions Precautions: Fall Recall of Precautions/Restrictions: Intact Precaution/Restrictions Comments: seizure Restrictions Weight Bearing Restrictions Per Provider Order: No     Mobility Bed Mobility Overal bed mobility: Needs Assistance             General bed mobility comments: pt OOB on arrival    Transfers Overall transfer level: Needs assistance Equipment used: Rolling walker (2 wheels) Transfers: Sit to/from Stand Sit to Stand: Contact guard assist                   Balance Overall balance assessment: Needs assistance Sitting-balance support: No upper extremity supported, Feet supported Sitting balance-Leahy Scale: Fair     Standing balance support: Bilateral upper extremity supported, Reliant on assistive device for balance Standing balance-Leahy Scale: Poor           ADL either performed or assessed with clinical judgement   ADL Overall ADL's : Needs assistance/impaired Eating/Feeding: Set up;Sitting   Grooming: Minimal assistance;Sitting   Upper Body Bathing: Minimal assistance;Moderate assistance;Sitting   Lower Body Bathing: Minimal assistance;Sitting/lateral leans   Upper Body Dressing : Minimal assistance;Sitting   Lower Body Dressing: Minimal assistance;Sitting/lateral leans;Contact guard assist   Toilet Transfer: Minimal assistance;Stand-pivot   Toileting- Clothing Manipulation and Hygiene: Moderate assistance;Maximal assistance;Sit to/from stand;Sitting/lateral lean       Functional mobility during ADLs: Minimal assistance General ADL Comments: pt is likely near his functional baseline     Vision Baseline Vision/History: 4 Cataracts Ability to See in Adequate Light: 1 Impaired Patient Visual Report: No change from baseline Vision Assessment?: No apparent visual deficits     Perception Perception: Not tested       Praxis Praxis: Impaired       Pertinent Vitals/Pain Pain Assessment Pain Assessment: No/denies pain Pain Intervention(s): Monitored during session     Extremity/Trunk Assessment Upper Extremity Assessment Upper Extremity Assessment: RUE deficits/detail;LUE deficits/detail RUE Deficits / Details: ROM WFL, pt with resting tremor of R hand at times during session. educated pt and his wife that a 1-2lb wrist weight may help tremors during functional tasks RUE Coordination: decreased fine motor;decreased gross motor LUE Deficits / Details: Overall  WFL. no tremor noted, generally weak    Lower Extremity Assessment Lower Extremity Assessment: Defer to PT evaluation   Cervical / Trunk Assessment Cervical / Trunk Assessment: Kyphotic   Communication Communication Communication: Impaired Factors Affecting Communication: Reduced clarity of speech   Cognition Arousal: Alert Behavior During Therapy: Flat affect Cognition: No apparent impairments             OT - Cognition Comments: overall WFL for basic command following and orientation. Pt's wife did most of the talking on evaluation, pt with limited verbalizations                 Following commands: Intact       Cueing  General Comments   Cueing Techniques: Verbal cues  VSS, increased time spent on family education and answreing questions           Home Living Family/patient expects to be discharged to:: Private residence Living Arrangements: Spouse/significant other;Other relatives Available Help at Discharge: Family Type of Home: House Home Access: Stairs to enter;Other (comment) Entrance Stairs-Number of Steps: level from garage to elevator Entrance Stairs-Rails: Can reach both Home Layout: Two level Alternate Level Stairs-Number of Steps: elevator   Bathroom Shower/Tub: Producer, television/film/video: Standard Bathroom Accessibility: Yes   Home Equipment: Shower seat - built in;Grab bars - tub/shower;Wheelchair - Careers adviser (comment);Wheelchair - power;Rollator (4 wheels)          Prior Functioning/Environment Prior Level of Function : Needs assist             Mobility Comments: [place] short distance ambulation at times with parkinson's metronome rollator. Uses w/c as well depending on how he is feeling. Assistance needed at times. pt's wife states pt is have a hard time learning how to navigate his PWC ADLs Comments: Assisted for all ADL's by family.    OT Problem List: Decreased strength;Decreased range of motion;Decreased activity tolerance;Impaired balance (sitting  and/or standing);Decreased cognition;Decreased coordination   OT Treatment/Interventions: Self-care/ADL training;Therapeutic exercise;Neuromuscular education;DME and/or AE instruction;Energy conservation;Therapeutic activities;Patient/family education;Balance training;Cognitive remediation/compensation      OT Goals(Current goals can be found in the care plan section)   Acute Rehab OT Goals Patient Stated Goal: to go home today OT Goal Formulation: With patient/family Time For Goal Achievement: 09/30/23 Potential to Achieve Goals: Fair   OT Frequency:  Min 3X/week       AM-PAC OT 6 Clicks Daily Activity     Outcome Measure Help from another person eating meals?: A Little Help from another person taking care of personal grooming?: A Little Help from another person toileting, which includes using toliet, bedpan, or urinal?: A Little Help from another person bathing (including washing, rinsing, drying)?: A Little Help from another person to put on and taking off regular upper body clothing?: A Little Help from another person to put on and taking off regular lower body clothing?: A Little 6 Click Score: 18   End of Session    Activity Tolerance: Patient tolerated treatment well Patient left: in chair;with call bell/phone within reach;with family/visitor present  OT Visit Diagnosis: Unsteadiness on feet (R26.81);Other abnormalities of gait and mobility (R26.89);Muscle weakness (generalized) (M62.81);History of falling (Z91.81);Other symptoms and signs involving cognitive function;Cognitive communication deficit (R41.841)                Time: 8874-8855 OT Time Calculation (min): 19 min Charges:  OT General Charges $OT Visit: 1 Visit OT Evaluation $OT Eval Moderate Complexity: 1 Mod  Lucie Kendall, OTR/L Acute Rehabilitation  Services Office [phone] Secure Chat Communication Preferred   Lucie JONETTA Kendall 10/12/2023, 1:04 PM

## 2023-10-12 NOTE — TOC Transition Note (Signed)
 Transition of Care [facility]) - Discharge Note   Patient Details  Name: [name] [name] MRN: [id number] Date of Birth: March 07, 1945  [facility]) CM/SW Contact:  Sanja Elizardo M, RN Phone Number: 10/12/2023, 10:10 AM   Clinical Narrative:    [name] [name] is a 78 y.o. male admitted after uncomplicated stereotactic left frontal biopsy. He was at baseline postop monitored in the ICU.   PTA, pt resided at home with spouse, and is active with [facility] and PT.  Resumption orders of care obtained for continuation of services at discharge.    Final next level of care: Home w Home Health Services Barriers to Discharge: Barriers Resolved   Patient Goals and CMS Choice   CMS Medicare.gov Compare Post Acute Care list provided to:: Patient Choice offered to / list presented to : Patient, Spouse                            Discharge Plan and Services Additional resources added to the After Visit Summary for     Discharge Planning Services: CM Consult Post Acute Care Choice: Home Health, Resumption of Svcs/PTA Provider                    HH Arranged: RN, PT [facility] Agency: [facility] Date [facility] Agency Contacted: 10/12/23 Time HH Agency Contacted: 1010 Representative spoke with at [facility] Agency: Darleene Gowda  Social Drivers of Health (SDOH) Interventions SDOH Screenings   Food Insecurity: No Food Insecurity (10/11/2023)  Housing: Low Risk  (10/11/2023)  Transportation Needs: No Transportation Needs (10/11/2023)  Utilities: Not At Risk (10/11/2023)  Depression (PHQ2-9): Low Risk  (09/28/2023)  Social Connections: Moderately Isolated (10/11/2023)  Tobacco Use: Low Risk  (10/11/2023)     Readmission Risk Interventions    09/20/2023   10:17 AM 07/10/2023   12:35 PM  Readmission Risk Prevention Plan  Post Dischage Appt  Complete  Medication Screening  Complete  Transportation Screening Complete Complete  HRI or Home Care Consult Complete   Social Work  Consult for Recovery Care Planning/Counseling Complete   Palliative Care Screening Complete   Medication Review (RN Care Manager) Complete    Mliss MICAEL Fass, RN, BSN  Trauma/Neuro ICU Case Manager [phone]

## 2023-10-12 NOTE — Progress Notes (Signed)
 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 Case unremarkable. Admitted to ICU post op as per pre op plan  PCCM consulted in this setting   Pertinent  Medical History  Parkinsons Seizure  BPH  Orthostatic hypotension  Significant Hospital Events: Including procedures, antibiotic start and stop dates in addition to other pertinent events   9/8 L brain biopsy, ICU admission, PCCM consult 9/9 PT/OT likely dc   Interim History / Subjective:  NAEO   CBGs are fine  No other labs this morning   Objective    Blood pressure (!) 116/57, pulse 67, temperature 97.6 F (36.4 C), temperature source Axillary, resp. rate 15, height 5' 10 (1.778 m), weight 70 kg, SpO2 97%.        Intake/Output Summary (Last 24 hours) at 10/12/2023 0928 Last data filed at 10/12/2023 0600 Gross per 24 hour  Intake 880 ml  Output 1960 ml  Net -1080 ml   Filed Weights   10/11/23 0558  Weight: 70 kg    Examination: General: chronically ill elderly M NAD  Neuro: Awake, following commands. No focal deficit  HENT: Surgical site dressing intact  Lungs: even unlabored on RA  Cardiovascular: rr brisk cap refill  Abdomen: soft ndnt  Extremities: no obvious acute deformity  GU: condom cath   Resolved problem list  Emergence delirium  Assessment and Plan   L brain mass s/p stereotactic brain bx Hx Parkinsons s/p DBS Seizure P -Seen/discussed w Dr. Lanis at the bedside w pt/wife -- doing very well POD1 and likely can dc home pending PT/OT eval  -post op dc instructions as per Dr Lanis & will follow up w Dr. Lanis outpt for staple removal  -path is pending  -cont decadron   -cont  sinemet , keppra      Labs   CBC: No results for input(s): WBC, NEUTROABS, HGB, HCT, MCV, PLT in the last 168 hours.  Basic Metabolic Panel: No results for input(s): NA, K, CL, CO2, GLUCOSE, BUN, CREATININE, CALCIUM , MG, PHOS in the last 168 hours. GFR: Estimated Creatinine Clearance: 61.8 mL/min (by C-G formula based on SCr of 0.96 mg/dL). No results for input(s): PROCALCITON, WBC, LATICACIDVEN in the last 168 hours.  Liver Function Tests: No results for input(s): AST, ALT, ALKPHOS, BILITOT, PROT, ALBUMIN in the last 168 hours. No results for input(s): LIPASE, AMYLASE in the last 168 hours. No results for input(s): AMMONIA in the last 168 hours.  ABG No results found for: PHART, PCO2ART, PO2ART, HCO3, TCO2, ACIDBASEDEF, O2SAT   Coagulation Profile: No results for input(s): INR, PROTIME in the last 168 hours.  Cardiac Enzymes: No results for input(s): CKTOTAL, CKMB, CKMBINDEX, TROPONINI in the last 168 hours.  HbA1C: Hgb A1c MFr Bld  Date/Time Value Ref Range Status  09/17/2023 04:54 AM 5.0 4.8 - 5.6 % Final    Comment:    (NOTE) Diagnosis of Diabetes The following HbA1c ranges recommended by the American Diabetes Association (ADA) may be used as an aid in the diagnosis of diabetes mellitus.  Hemoglobin             Suggested A1C NGSP%  Diagnosis  <5.7                   Non Diabetic  5.7-6.4                Pre-Diabetic  >6.4                   Diabetic  <7.0                   Glycemic control for                       adults with diabetes.      CBG: Recent Labs  Lab 10/11/23 1650 10/11/23 2122 10/12/23 0758  GLUCAP 133* 133* 128*   Low MDM    Ronnald Gave MSN, AGACNP-BC [facility] Pulmonary/Critical [facility] Medicine Amion for pager  10/12/2023, 9:28 AM

## 2023-10-12 NOTE — Evaluation (Signed)
 Physical Therapy Evaluation Patient Details Name: [name] [name] MRN: [id number] DOB: 03/23/1945 Today's Date: 10/12/2023  History of Present Illness  78 y.o. male presents to [facility] hospital on 10/11/2023 for L brain mass biopsy. PMHx: brain mass, Parkinson's, seizures, BPH.  Clinical Impression  Pt presents to PT with deficits in functional mobility, gait, balance, power, endurance. Pt is able to transfer and ambulate with support of RW for short household distances. Pt does experience possible pre-syncopal symptoms during 2nd bout of ambulation, leaning forward onto RW which he typically does when experiencing these symptoms at home. Pt is able to recover and return to chair. PT will follow during admission for progression of mobility. Discharge home with HHPT is recommended when medically appropriate.        If plan is discharge home, recommend the following: A little help with walking and/or transfers;A lot of help with bathing/dressing/bathroom;Assistance with cooking/housework;Assist for transportation;Help with stairs or ramp for entrance   Can travel by private vehicle   Yes    Equipment Recommendations None recommended by PT  Recommendations for Other Services       Functional Status Assessment Patient has had a recent decline in their functional status and demonstrates the ability to make significant improvements in function in a reasonable and predictable amount of time.     Precautions / Restrictions Precautions Precautions: Fall Recall of Precautions/Restrictions: Intact Restrictions Weight Bearing Restrictions Per Provider Order: No      Mobility  Bed Mobility Overal bed mobility: Needs Assistance Bed Mobility: Supine to Sit     Supine to sit: Contact guard, HOB elevated          Transfers Overall transfer level: Needs assistance Equipment used: Rolling walker (2 wheels) Transfers: Sit to/from Stand Sit to Stand: Contact guard assist                 Ambulation/Gait Ambulation/Gait assistance: Min assist Gait Distance (Feet): 70 Feet (additional trial of 25') Assistive device: Rolling walker (2 wheels) Gait Pattern/deviations: Step-through pattern, Shuffle Gait velocity: reduced Gait velocity interpretation: <1.8 ft/sec, indicate of risk for recurrent falls   General Gait Details: pt with steady step-through gait when ambulating in a straight path, shuffling steps with changes in direction or when encouteringt obstacles in path. During 2nd bout of ambulation pt stops walking for brief period leaning forward onto RW. PT presumes pt experiencing pre-syncopal symptoms however difficult to hear pt reports based on low volume of speech  Stairs            Wheelchair Mobility     Tilt Bed    Modified Rankin (Stroke Patients Only)       Balance Overall balance assessment: Needs assistance Sitting-balance support: No upper extremity supported, Feet supported Sitting balance-Leahy Scale: Fair     Standing balance support: Bilateral upper extremity supported, Reliant on assistive device for balance Standing balance-Leahy Scale: Poor                               Pertinent Vitals/Pain Pain Assessment Pain Assessment: No/denies pain    Home Living Family/patient expects to be discharged to:: Private residence Living Arrangements: Spouse/significant other;Other relatives Available Help at Discharge: Family Type of Home: House Home Access: Stairs to enter;Other (comment) Entrance Stairs-Rails: Can reach both Entrance Stairs-Number of Steps: level from garage to elevator Alternate Level Stairs-Number of Steps: elevator Home Layout: Two level Home Equipment: Shower seat - built in;Grab bars -  tub/shower;Wheelchair - Careers adviser (comment);Wheelchair - power;Rollator (4 wheels)      Prior Function Prior Level of Function : Needs assist             Mobility Comments: [place] short distance ambulation at  times with parkinson's metronome rollator. Uses w/c as well depending on how he is feeling. Assistance needed at times. ADLs Comments: Assisted for all ADL's by family.     Extremity/Trunk Assessment   Upper Extremity Assessment Upper Extremity Assessment: RUE deficits/detail RUE Deficits / Details: ROM WFL, pt with resting tremor of R hand at times during session    Lower Extremity Assessment Lower Extremity Assessment: Generalized weakness (ROM WFL)    Cervical / Trunk Assessment Cervical / Trunk Assessment: Kyphotic  Communication   Communication Communication: Impaired Factors Affecting Communication: Reduced clarity of speech (hypophonia)    Cognition Arousal: Alert Behavior During Therapy: Flat affect   PT - Cognitive impairments: History of cognitive impairments                       PT - Cognition Comments: wife reports hallucinations at night with some anxiety (was in [place]) Following commands: Intact       Cueing Cueing Techniques: Verbal cues     General Comments General comments (skin integrity, edema, etc.): BP after initial bout of ambulation is 118/55. BP after 2nd bout of mobility with potential pre-syncopal symptoms is 97/67.    Exercises     Assessment/Plan    PT Assessment Patient needs continued PT services  PT Problem List Decreased strength;Decreased activity tolerance;Decreased balance;Decreased mobility;Decreased knowledge of use of DME       PT Treatment Interventions DME instruction;Gait training;Stair training;Functional mobility training;Therapeutic activities;Balance training;Therapeutic exercise;Neuromuscular re-education;Patient/family education    PT Goals (Current goals can be found in the Care Plan section)  Acute Rehab PT Goals Patient Stated Goal: to return home PT Goal Formulation: With patient/family Time For Goal Achievement: 10/26/23 Potential to Achieve Goals: Good Additional Goals Additional Goal #1: Pt  will mobilize in a manual wheelchair for >150' with supervision to demonstrate improved ability to navigate around the home without assistance of caregivers    Frequency Min 2X/week     Co-evaluation               AM-PAC PT 6 Clicks Mobility  Outcome Measure Help needed turning from your back to your side while in a flat bed without using bedrails?: A Little Help needed moving from lying on your back to sitting on the side of a flat bed without using bedrails?: A Little Help needed moving to and from a bed to a chair (including a wheelchair)?: A Little Help needed standing up from a chair using your arms (e.g., wheelchair or bedside chair)?: A Little Help needed to walk in hospital room?: A Little Help needed climbing 3-5 steps with a railing? : A Lot 6 Click Score: 17    End of Session Equipment Utilized During Treatment: Gait belt Activity Tolerance: Patient tolerated treatment well Patient left: in chair;with call bell/phone within reach;with chair alarm set;with family/visitor present Nurse Communication: Mobility status PT Visit Diagnosis: Other abnormalities of gait and mobility (R26.89);Muscle weakness (generalized) (M62.81)    Time: 9083-9047 PT Time Calculation (min) (ACUTE ONLY): 36 min   Charges:   PT Evaluation $PT Eval Low Complexity: 1 Low PT Treatments $Therapeutic Activity: 8-22 mins PT General Charges $$ ACUTE PT VISIT: 1 Visit         Landon Bassford  JINNY Ruth, PT, DPT Acute Rehabilitation Office [phone]   Bernardino JINNY Ruth 10/12/2023, 11:13 AM

## 2023-10-13 ENCOUNTER — Other Ambulatory Visit: Payer: Self-pay | Admitting: Radiation Therapy

## 2023-10-18 ENCOUNTER — Other Ambulatory Visit: Payer: Self-pay | Admitting: Internal Medicine

## 2023-10-18 ENCOUNTER — Telehealth: Payer: Self-pay | Admitting: *Deleted

## 2023-10-18 ENCOUNTER — Inpatient Hospital Stay: Attending: Neurosurgery

## 2023-10-18 DIAGNOSIS — D496 Neoplasm of unspecified behavior of brain: Secondary | ICD-10-CM | POA: Insufficient documentation

## 2023-10-18 DIAGNOSIS — R531 Weakness: Secondary | ICD-10-CM | POA: Insufficient documentation

## 2023-10-18 DIAGNOSIS — G9389 Other specified disorders of brain: Secondary | ICD-10-CM

## 2023-10-18 NOTE — Telephone Encounter (Signed)
 PC to patient, no answer, left VM - informed patient his case was discussed in Tumor Board this morning & it was recommended he have an updated MRI brain.  MRI has been scheduled on 10/19/23 at 5:30 at [facility].  He also has a F/U appointment scheduled with Dr [name] to discuss results on 10/21/23 at 9:30.  Instructed patient to contact this office if either appointment needs to be changed, [phone].

## 2023-10-19 ENCOUNTER — Ambulatory Visit (HOSPITAL_COMMUNITY)
Admission: RE | Admit: 2023-10-19 | Discharge: 2023-10-19 | Disposition: A | Discharge status: Home / Self Care | Source: Ambulatory Visit | Attending: Internal Medicine | Admitting: Internal Medicine

## 2023-10-19 DIAGNOSIS — G9389 Other specified disorders of brain: Secondary | ICD-10-CM

## 2023-10-20 ENCOUNTER — Ambulatory Visit (HOSPITAL_COMMUNITY)
Admission: RE | Admit: 2023-10-20 | Discharge: 2023-10-20 | Disposition: A | Discharge status: Home / Self Care | Source: Ambulatory Visit | Attending: Internal Medicine | Admitting: Internal Medicine

## 2023-10-20 DIAGNOSIS — G9389 Other specified disorders of brain: Secondary | ICD-10-CM | POA: Diagnosis present

## 2023-10-20 MED ORDER — GADOBUTROL 1 MMOL/ML IV SOLN
7.0000 mL | Freq: Once | INTRAVENOUS | Status: AC | PRN
  Administered 2023-10-20: 7 mL via INTRAVENOUS

## 2023-10-21 ENCOUNTER — Inpatient Hospital Stay (HOSPITAL_BASED_OUTPATIENT_CLINIC_OR_DEPARTMENT_OTHER): Admitting: Internal Medicine

## 2023-10-21 VITALS — BP 128/70 | HR 71 | Temp 97.7°F | Resp 18

## 2023-10-21 DIAGNOSIS — D496 Neoplasm of unspecified behavior of brain: Secondary | ICD-10-CM | POA: Diagnosis present

## 2023-10-21 DIAGNOSIS — R531 Weakness: Secondary | ICD-10-CM | POA: Diagnosis not present

## 2023-10-21 NOTE — Progress Notes (Signed)
 [facility] [address]  [place], [place] [address] [phone]   Interval Evaluation  Date of Service: 10/21/23 Patient Name: [name] [name] Patient MRN: [id number] Patient DOB: 10-06-1945 Provider: Arthea MARLA Manns, MD  Identifying Statement:  [name] [name] is a 78 y.o. male with left frontal mass   Oncologic History: 10/11/23: Stereotactic biopsy with Dr. Lanis; path is indeterminate  Biomarkers:  MGMT Unknown.  IDH 1/2 Unknown.  EGFR Unknown  TERT Unknown   Interval History: [name] [name] presents today for follow up after recent brain biopsy, MRI study.  He tolerated procedure well without complication.  He and his family describe improvement in function since prior visit.  He is doing some assisted walking around the home, closer to his prior baseline with Parkinsons.  His right arm is stronger and the hand is more functional.  Energy level and appetite are strong overall.  No seizures, headaches.  No other medication changes.  H+P (09/28/23) Patient presented to neurologic attention this month with several days history of slurred speech and right sided weakness.  He also had seizure episode described as staring head turned to left, followed by 1-2 days of confusion.  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  Was started on decadron  4mg  twice per day, which has ultimately not led to improvement in his symptoms.  He has baseline mobility issues from Parkinson's disease, had a deep brain stimulator placed in 2014.  Keppra  remains at 500mg  twice per day without issue.  Medications: Current Outpatient Medications on File Prior to Visit  Medication Sig Dispense Refill   carbidopa -levodopa  (SINEMET  CR) 50-200 MG tablet Take 1 tablet by mouth at bedtime.     carbidopa -levodopa  (SINEMET  IR) 25-100 MG tablet 2-2.5 tablets 4 times per day at 6am/10:30am/2pm/6pm (Patient taking differently: Take 2 tablets by  mouth 4 (four) times daily. 6am/10:30am/2pm/6pm)     cyanocobalamin  1000 MCG tablet Take 1 tablet (1,000 mcg total) by mouth daily. 30 tablet 0   dexamethasone  (DECADRON ) 4 MG tablet Take 4 mg by mouth daily.     fludrocortisone  (FLORINEF ) 0.1 MG tablet Take 2 tablets (0.2 mg total) by mouth 3 (three) times daily with meals. (Patient not taking: Reported on 09/28/2023) 180 tablet 0   levETIRAcetam  (KEPPRA ) 500 MG tablet Take 1 tablet (500 mg total) by mouth 2 (two) times daily. 60 tablet 0   Levodopa  (INBRIJA ) 42 MG CAPS We are going to start inbrija .  Remember that TWO capsules is ONE dosage (never inhale just one capsule).  You can inhale the capsules as needed up to 5 times per day, separated by 2 hour intervals. (Patient taking differently: Place 1 capsule into inhaler and inhale as needed (can't walk, muscle rigidedness). We are going to start inbrija .  Remember that TWO capsules is ONE dosage (never inhale just one capsule).  You can inhale the capsules as needed up to 5 times per day, separated by 2 hour intervals.)     midodrine  (PROAMATINE ) 5 MG tablet Take 1 tablet (5 mg total) by mouth 3 (three) times daily with meals. (Patient not taking: Reported on 09/28/2023) 270 tablet 1   pantoprazole  (PROTONIX ) 40 MG tablet Take 1 tablet (40 mg total) by mouth daily. 30 tablet 0   No current facility-administered medications on file prior to visit.    Allergies:  Allergies  Allergen Reactions   Alfuzosin  Other (See Comments)    Hypotension    Rivastigmine Tartrate  Nausea And Vomiting     Can tolerate the patch but can not tolerate the pills   Tamsulosin Other (See Comments)    Severe joint pain   Past Medical History:  Past Medical History:  Diagnosis Date   Agent orange exposure    Anemia    BPH (benign prostatic hypertrophy)    Cancer (HCC)    basal cell temple   Complication of anesthesia    confusion and combative when waking up from last kidney procedure   Constipation     Depression    Fatigue    d/t Parkinson's   GERD (gastroesophageal reflux disease)    occasional   History of kidney stones    Left ureteral calculus    Lower urinary tract symptoms (LUTS)    Nephrolithiasis    Neuromuscular disorder (HCC)     parkinson's   Orthostatic hypotension    d/t Parkinson's disease   Orthostatic hypotension    Parkinson's disease (HCC)    Peyronie disease    Pneumonia 2018   Renal cyst, left    S/P deep brain stimulator placement    11-29-2012   Seizure (HCC)    pt and wife report that he has had seizures and is now on seizure medicine   Sleep behavior disorder, REM    Wears glasses    Wears hearing aid    BILATERAL-- INTERMITTANT WEARS   Past Surgical History:  Past Surgical History:  Procedure Laterality Date   APPLICATION OF CRANIAL NAVIGATION Left 10/11/2023   Procedure: COMPUTER-ASSISTED NAVIGATION, FOR CRANIAL PROCEDURE;  Surgeon: Lanis Pupa, MD;  Location: [facility];  Service: Neurosurgery;  Laterality: Left;   CATARACT EXTRACTION Bilateral    CHOLECYSTECTOMY  12/21/2014   Procedure: LAPAROSCOPIC CHOLECYSTECTOMY;  Surgeon: Lynda Leos, MD;  Location: [facility] OR;  Service: General;;   COLONOSCOPY     CYSTO/  LEFT URETEROSOCPY/   LEFT URETERAL STENT PLACEMENT/  LASER BLADDER STONES AND EXTRACTION  09/25/2010   CYSTOSCOPY WITH INSERTION OF UROLIFT N/A 03/11/2017   Procedure: CYSTOSCOPY WITH INSERTION OF UROLIFT, LITHOPAXY,STONE OBTAINED;  Surgeon: Matilda Senior, MD;  Location: [facility];  Service: Urology;  Laterality: N/A;   CYSTOSCOPY WITH LITHOLAPAXY N/A 09/12/2020   Procedure: CYSTOSCOPY WITH LITHOLAPAXY, LEFT RETROGRADE PYELOGRAMS, LEFT URETEROSCOPY, AND STENT PLACEMENT;  Surgeon: Cam Morene ORN, MD;  Location: [facility];  Service: Urology;  Laterality: N/A;  REQUESTING 2 HRS   CYSTOSCOPY WITH LITHOLAPAXY Right 07/14/2023   Procedure: CYSTOSCOPY, WITH BLADDER CALCULUS LITHOLAPAXY;  Surgeon:  Cam Morene ORN, MD;  Location: [facility];  Service: Urology;  Laterality: Right;  CYSTOLITHOLAPAXY, RIGHT URETEROSCOPY, LASER LITHOTRIPSY, STONE EXTRACTION, RIGHT URETERAL STENT PLACEMENT   CYSTOSCOPY WITH RETROGRADE PYELOGRAM, URETEROSCOPY AND STENT PLACEMENT Left 01/06/2013   Procedure: CYSTOSCOPY WITH RETROGRADE PYELOGRAM, URETEROSCOPY AND STENT PLACEMENT;  Surgeon: Thomasine Oiler, MD;  Location: [facility];  Service: Urology;  Laterality: Left;   CYSTOSCOPY WITH RETROGRADE PYELOGRAM, URETEROSCOPY AND STENT PLACEMENT Left 09/11/2014   Procedure: CYSTOSCOPY WITH LEFT RETROGRADE PYELOGRAM, URETEROSCOPY AND STENT PLACEMENT;  Surgeon: Oliva Oiler, MD;  Location: [facility];  Service: Urology;  Laterality: Left;   CYSTOSCOPY/RETROGRADE/URETEROSCOPY/STONE EXTRACTION WITH BASKET Right 07/14/2023   Procedure: CYSTOSCOPY, WITH CALCULUS REMOVAL USING BASKET;  Surgeon: Cam Morene ORN, MD;  Location: [facility];  Service: Urology;  Laterality: Right;   CYSTOSCOPY/URETEROSCOPY/HOLMIUM LASER/STENT PLACEMENT Right 07/14/2023   Procedure: CYSTOSCOPY/URETEROSCOPY/HOLMIUM LASER/STENT PLACEMENT;  Surgeon: Cam Morene ORN, MD;  Location: [facility];  Service: Urology;  Laterality: Right;   DEEP BRAIN STIMULATOR PLACEMENT  11/29/2012   genertor device at left pectoral area-   EXTRACORPOREAL SHOCK WAVE LITHOTRIPSY Left 10-11-2010  &  01-05-2011   HOLMIUM LASER APPLICATION Left 01/06/2013   Procedure: HOLMIUM LASER APPLICATION;  Surgeon: Thomasine Oiler, MD;  Location: [facility];  Service: Urology;  Laterality: Left;   HOLMIUM LASER APPLICATION Left 09/11/2014   Procedure: HOLMIUM LASER APPLICATION;  Surgeon: Oliva Oiler, MD;  Location: [facility];  Service: Urology;  Laterality: Left;   HOLMIUM LASER APPLICATION N/A 03/11/2017   Procedure: HOLMIUM LASER APPLICATION;  Surgeon: Matilda Senior, MD;  Location: [facility];  Service: Urology;   Laterality: N/A;   NEGATIVE SLEEP STUDY  2013  per pt   NEPHROLITHOTOMY Right 08/13/2022   Procedure: RIGHT NEPHROLITHOTOMY PERCUTANEOUS;  Surgeon: Cam Morene ORN, MD;  Location: [facility];  Service: Urology;  Laterality: Right;  210 MINUTES   PR DURAL GRAFT SPINAL Left 10/11/2023   Procedure: FRAMELESS STEREOTACTIC BIOPSY;  Surgeon: Lanis Pupa, MD;  Location: [facility] OR;  Service: Neurosurgery;  Laterality: Left;  STEREOTACTIC LEFT BRAIN BX   SP PERC NEPHROSTOMY Left 12/16/2012   STONE EXTRACTION WITH BASKET Left 01/06/2013   Procedure: STONE EXTRACTION WITH BASKET;  Surgeon: Thomasine Oiler, MD;  Location: [facility];  Service: Urology;  Laterality: Left;   TONSILLECTOMY     TRANSURETHRAL RESECTION OF PROSTATE N/A 09/12/2020   Procedure: TRANSURETHRAL RESECTION OF THE PROSTATE (TURP);  Surgeon: Cam Morene ORN, MD;  Location: [facility];  Service: Urology;  Laterality: N/A;   Social History:  Social History   Socioeconomic History   Marital status: Married    Spouse name: Not on file   Number of children: Not on file   Years of education: Not on file   Highest education level: Not on file  Occupational History   Not on file  Tobacco Use   Smoking status: Never   Smokeless tobacco: Never  Vaping Use   Vaping status: Never Used  Substance and Sexual Activity   Alcohol  use: Not Currently    Comment: rarely beer   Drug use: No   Sexual activity: Not Currently  Other Topics Concern   Not on file  Social History Narrative   Not on file   Social Drivers of Health   Financial Resource Strain: Not on file  Food Insecurity: No Food Insecurity (10/11/2023)   Hunger Vital Sign    Worried About Running Out of Food in the Last Year: Never true    Ran Out of Food in the Last Year: Never true  Transportation Needs: No Transportation Needs (10/11/2023)   PRAPARE - Administrator, Civil Service (Medical): No    Lack of Transportation  (Non-Medical): No  Physical Activity: Not on file  Stress: Not on file  Social Connections: Moderately Isolated (10/11/2023)   Social Connection and Isolation Panel    Frequency of Communication with Friends and Family: Three times a week    Frequency of Social Gatherings with Friends and Family: Three times a week    Attends Religious Services: Never    Active Member of Clubs or Organizations: No    Attends Banker Meetings: Never    Marital Status: Married  Catering manager Violence: Not At Risk (10/11/2023)   Humiliation, Afraid, Rape, and Kick questionnaire    Fear of Current or Ex-Partner: No    Emotionally Abused: No  Physically Abused: No    Sexually Abused: No   Family History:  Family History  Problem Relation Age of Onset   Hypertension Mother    Dementia Father    Cancer Father    Heart attack Sister    Arthritis/Rheumatoid Sister     Review of Systems: Constitutional: Doesn't report fevers, chills or abnormal weight loss Eyes: Doesn't report blurriness of vision Ears, nose, mouth, throat, and face: Doesn't report sore throat Respiratory: Doesn't report cough, dyspnea or wheezes Cardiovascular: Doesn't report palpitation, chest discomfort  Gastrointestinal:  Doesn't report nausea, constipation, diarrhea GU: Doesn't report incontinence Skin: Doesn't report skin rashes Neurological: Per HPI Musculoskeletal: Doesn't report joint pain Behavioral/Psych: Doesn't report anxiety  Physical Exam: There were no vitals filed for this visit.  KPS: 70. General: Alert, cooperative, pleasant, in no acute distress Head: Normal EENT: No conjunctival injection or scleral icterus.  Lungs: Resp effort normal Cardiac: Regular rate Abdomen: Non-distended abdomen Skin: No rashes cyanosis or petechiae. Extremities: No clubbing or edema  Neurologic Exam: Mental Status: Awake, alert, attentive to examiner. Oriented to self and environment. Language is fluent with  intact comprehension.  Pyschomotor slowing. Hypophonic Cranial Nerves: Visual acuity is grossly normal. Visual fields are full. Extra-ocular movements intact. No ptosis. Face is symmetric Motor: Parkinsonian tremor with cogwheeling. Power is 4/5 in right arm and leg. Reflexes are symmetric, no pathologic reflexes present.  Sensory: Intact to light touch Gait: Assisted   Labs: I have reviewed the data as listed    Component Value Date/Time   NA 134 (L) 09/25/2023 0527   K 4.6 09/25/2023 0527   CL 108 09/25/2023 0527   CO2 25 09/25/2023 0527   GLUCOSE 129 (H) 09/25/2023 0527   BUN 33 (H) 09/25/2023 0527   CREATININE 0.96 09/25/2023 0527   CALCIUM  8.3 (L) 09/25/2023 0527   PROT 5.6 (L) 09/23/2023 1714   ALBUMIN 2.9 (L) 09/23/2023 1714   AST 21 09/23/2023 1714   ALT 10 09/23/2023 1714   ALKPHOS 58 09/23/2023 1714   BILITOT 0.8 09/23/2023 1714   GFRNONAA >60 09/25/2023 0527   GFRAA >60 12/12/2014 1056   Lab Results  Component Value Date   WBC 14.6 (H) 09/25/2023   NEUTROABS 12.0 (H) 09/23/2023   HGB 13.0 09/25/2023   HCT 37.5 (L) 09/25/2023   MCV 93.8 09/25/2023   PLT 370 09/25/2023   Imaging:  [facility] Clinician Interpretation: I have personally reviewed the CNS images as listed.  My interpretation, in the context of the patient's clinical presentation, is decreased enhancing burden  MR BRAIN W WO CONTRAST Result Date: 10/20/2023 CLINICAL DATA:  Brain/CNS neoplasm, assess treatment response. Status post left frontal brain biopsy with preliminary pathology demonstrating abnormal hypercellularity suspicious for tumor, pending outside consultation. EXAM: MRI HEAD WITHOUT AND WITH CONTRAST TECHNIQUE: Multiplanar, multiecho pulse sequences of the brain and surrounding structures were obtained without and with intravenous contrast. CONTRAST:  7mL GADAVIST  GADOBUTROL  1 MMOL/ML IV SOLN COMPARISON:  Head CT 09/24/2023 and MRI 09/15/2023 FINDINGS: Brain: There is persistent extensive,  confluent T2 FLAIR hyperintensity in the left cerebral hemispheric white matter, greatest in the frontoparietal region but extending inferiorly into the temporal lobe and deep white matter tracts at the posterior left basal ganglia as well. T2 FLAIR hyperintensity also extends across the corpus callosum into the right frontoparietal and to a lesser extent temporal white matter. T2 hyperintensity has overall mildly improved from the prior motion degraded MRI, most notably the more inferiorly located components in the left  hemisphere, and mild mass effect on the prior MRI has resolved. There are scattered small nodular foci of enhancement within these areas of T2 signal abnormality in both cerebral hemispheres, however the overall volume of enhancement has greatly decreased from the prior MRI. There is persistent mild trace diffusion weighted signal hyperintensity in the left greater than right cerebral hemispheres which appears to reflect a combination of T2 shine through as well as some areas of truly mildly restricted diffusion. No progressive findings are evident. A new small focus of blood products in the posterior left frontal lobe is related to the recent biopsy. Bilateral deep brain stimulators remain in place. Mild cerebral atrophy is within normal limits for age. No midline shift, hydrocephalus, or extra-axial fluid collection is identified. Vascular: Major intracranial vascular flow voids are preserved. Skull and upper cervical spine: Unremarkable bone marrow signal. Sinuses/Orbits: Bilateral cataract extraction. Paranasal sinuses and mastoid air cells are clear. Other: None. IMPRESSION: Greatly decreased enhancement and mildly improved T2 signal abnormality of the bihemispheric abnormality, potentially related to steroid therapy. 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 Electronically Signed   By: Dasie Hamburg M.D.   On: 10/20/2023 17:07   EEG  adult Result Date: 09/24/2023 Shelton Arlin KIDD, MD     09/24/2023  2:31 PM Patient Name: DAQUANN MERRIOTT MRN: [id number] Epilepsy Attending: Arlin KIDD Shelton Referring Physician/Provider: Pearlean Tully BRAVO, MD Date: 09/24/2023 Duration: 23.20 mins Patient history: 78yo M with new onset seizures. EEG to evaluate for seizure. Level of alertness: Awake, drowsy AEDs during EEG study: LEV Technical aspects: This EEG study was done with scalp electrodes positioned according to the 10-20 International system of electrode placement. Electrical activity was reviewed with band pass filter of 1-70Hz , sensitivity of 7 uV/mm, display speed of 88mm/sec with a 60Hz  notched filter applied as appropriate. EEG data were recorded continuously and digitally stored.  Video monitoring was available and reviewed as appropriate. Description: The posterior dominant rhythm consists of 7Hz  activity of moderate voltage (25-35 uV) seen predominantly in posterior head regions, symmetric and reactive to eye opening and eye closing. Drowsiness was characterized by attenuation of the posterior background rhythm. EEG showed continuous generalized 3 to 7 Hz theta-delta slowing. Hyperventilation and photic stimulation were not performed.   ABNORMALITY - Continuous slow, generalized IMPRESSION: This study is suggestive of moderate diffuse encephalopathy. No seizures or epileptiform discharges were seen throughout the recording. Priyanka KIDD Shelton   CT HEAD WO CONTRAST ( ) Result Date: 09/24/2023 CLINICAL DATA:  78 year old male code stroke presentation yesterday. Chronic deep brain stimulator. Abnormal MRI, bihemispheric abnormal signal and enhancement suspicious for a high-grade tumor. EXAM: CT HEAD WITHOUT CONTRAST TECHNIQUE: Contiguous axial images were obtained from the base of the skull through the vertex without intravenous contrast. RADIATION DOSE REDUCTION: This exam was performed according to the departmental dose-optimization program  which includes automated exposure control, adjustment of the mA and/or kV according to patient size and/or use of iterative reconstruction technique. COMPARISON:  Head CT 09/23/2023 and earlier. FINDINGS: Brain: Chronic bilateral subthalamic deep brain stimulator leads, present on the left side since at least 2014 and stable recently. Superimposed confluent and masslike hypodensity in the posterior left frontal lobe, appears not significantly changed in size or configuration since the MRI 09/15/2023. no significant intracranial mass effect. Stable gray-white matter differentiation throughout the brain. No midline shift, ventriculomegaly, intracranial hemorrhage or evidence of cortically based acute infarction. Vascular: Calcified atherosclerosis at the skull base. No suspicious intracranial vascular  hyperdensity. Skull: Stable postoperative changes related to bilateral deep brain stimulator. No acute osseous abnormality identified. Sinuses/Orbits: Visualized paranasal sinuses and mastoids are stable and well aerated. Other: Chronic DBS stimulator leads, present on the left side since at least 2014. No acute orbit or scalp soft tissue finding. IMPRESSION: 1. Grossly stable since the MRI 09/15/2023 left > right bihemispheric abnormality most suspicious for an infiltrative tumor, top differential consideration CNS lymphoma and high-grade glioma. No significant intracranial mass effect at this time. 2. Underlying chronic bilateral deep brain stimulators. 3. No new intracranial abnormality identified. Electronically Signed   By: VEAR Hurst M.D.   On: 09/24/2023 13:43   CT HEAD CODE STROKE WO CONTRAST Result Date: 09/23/2023 CLINICAL DATA:  Code stroke. Neuro deficit, acute, stroke suspected. Right-sided weakness. Slurred speech. EXAM: CT HEAD WITHOUT CONTRAST TECHNIQUE: Contiguous axial images were obtained from the base of the skull through the vertex without intravenous contrast. RADIATION DOSE REDUCTION: This exam was  performed according to the departmental dose-optimization program which includes automated exposure control, adjustment of the mA and/or kV according to patient size and/or use of iterative reconstruction technique. COMPARISON:  Brain MRI 09/15/2023.  Head CT 09/15/2023. FINDINGS: Brain: Generalized cerebral atrophy. Deep brain stimulator leads terminating in the region of the subthalamic nuclei bilaterally. Persistent region of abnormal edema within the left cerebral hemisphere as described on the prior head CT of 09/15/2023 and better characterized on the brain MRI of 09/15/2023. Background mild patchy and ill-defined hypoattenuation within the cerebral white matter, nonspecific but compatible chronic small vessel disease. There is no acute intracranial hemorrhage. No acute demarcated cortical infarct. No extra-axial fluid collection. No midline shift. Vascular: No hyperdense vessel. Skull: No acute calvarial fracture. Bilateral frontoparietal burr holes with traversing deep brain stimulator leads. Sinuses/Orbits: No orbital mass or acute orbital finding. No significant paranasal sinus disease at the imaged levels. ASPECTS (Alberta Stroke Program Early CT Score) - Ganglionic level infarction (caudate, lentiform nuclei, internal capsule, insula, M1-M3 cortex): 7 - Supraganglionic infarction (M4-M6 cortex): 3 Total score (0-10 with 10 being normal): 10 No evidence of interval acute intracranially as compared to the prior brain MRI of 09/15/2023. These results were communicated to Dr. Voncile at 5:22 pmon 8/21/2025by text page via the [facility] messaging system. IMPRESSION: 1. Persistent region of abnormal edema within the left cerebral hemisphere as described on the recent prior head CT of 09/15/2023 and better characterized on the recent prior brain MRI of 09/15/2023. Differential considerations include primary CNS neoplasm, lymphoma, an inflammatory process and recent demyelination, among others. 2. No evidence of an  interval acute intracranial abnormality. 3. Background parenchymal atrophy and chronic small ischemic disease. Electronically Signed   By: Rockey Childs D.O.   On: 09/23/2023 17:22   Pathology:  SURGICAL PATHOLOGY CASE: [id number] PATIENT: Tamarick Latorre Surgical Pathology Report     Clinical History: brain tumor (cm)     FINAL MICROSCOPIC DIAGNOSIS:  A. BRAIN TUMOR, BIOPSY: Abnormal hypercellular neuropil suspicious for tumor, pending outside consultation  COMMENT:  Sections show cores of brain exhibiting increased cellularity and a focally prominent vasculature.  There is a patchy perivascular cuffing of small mature appearing lymphocytes.  Focally these lymphocytes have associated hemosiderin laden macrophages.  Otherwise, the background neuropil appears vacuolated.  No mitotic activity or necrosis is seen. This case will be forwarded to [facility] neuropathology for outside consultation and their expert opinion.  An amended diagnosis will be issued upon the receipt of this consult.  Status of the case is  conveyed to Dr. Lanis by Dr. Reed via [employer] on 10/13/2023.  GROSS DESCRIPTION:  The specimen is received fresh and consists of multiple core and core fragments of tan-white softened tissue, ranging from 0.2 cm to 0.8 cm in length by 0.2 cm in diameter.  The specimen is entirely submitted in 1 cassette.  ([facility] 10/12/2023)  Final Diagnosis performed by Prentice Reed, MD.   Electronically signed 10/13/2023    Assessment/Plan Brain tumor [facility])  Right sided weakness  KYREL LEIGHTON is clinically stable today, now having completed brain biopsy, updated MRI study.  Path report was indeterminate but remained suspicious for neoplastic process.  Slides were sent out to [facility] and we are awaiting final updates.    The improvement in the MRI following steroids is encouraging but certainly does not rule out CNS lymphoma here.   We discussed and  recommended actually proceeding with some staging workup given likelihood of lymphoma.  We will move forward with FDG-PET and defer CSF sampling, slit lamp, MRI spine per patient preference.     In meantime, he can continue Keppra  500mg  BID and should continue decadron  to 4mg  daily if tolerated.  We will check in 1-2 weeks via phone to carefully adjust steroid dosage.  We may have path update to review at that time as well.    Next MRI should be in 6-8 weeks, pending further updates from histology, staging.  We ask that Jolee ONEIDA Pesa return to clinic in 2 weeks, or sooner as needed.  All questions were answered. The patient knows to call the clinic with any problems, questions or concerns. No barriers to learning were detected.  The total time spent in the encounter was 40 minutes and more than 50% was on counseling and review of test results   Arthea MARLA Manns, MD Medical Director of Neuro-Oncology [facility] 10/21/23 9:45 AM

## 2023-10-26 ENCOUNTER — Other Ambulatory Visit (HOSPITAL_COMMUNITY): Payer: Self-pay

## 2023-10-26 ENCOUNTER — Telehealth: Payer: Self-pay | Admitting: Neurology

## 2023-10-26 ENCOUNTER — Other Ambulatory Visit (HOSPITAL_BASED_OUTPATIENT_CLINIC_OR_DEPARTMENT_OTHER): Payer: Self-pay

## 2023-10-26 ENCOUNTER — Ambulatory Visit
Admission: RE | Admit: 2023-10-26 | Discharge: 2023-10-26 | Disposition: A | Discharge status: Home / Self Care | Source: Ambulatory Visit | Attending: Nurse Practitioner | Admitting: Nurse Practitioner

## 2023-10-26 VITALS — BP 97/63 | HR 85 | Temp 98.2°F | Resp 18

## 2023-10-26 DIAGNOSIS — J069 Acute upper respiratory infection, unspecified: Secondary | ICD-10-CM | POA: Diagnosis not present

## 2023-10-26 LAB — POC COVID19/FLU A&B COMBO
Covid Antigen, POC: NEGATIVE
Influenza A Antigen, POC: NEGATIVE
Influenza B Antigen, POC: NEGATIVE

## 2023-10-26 MED ORDER — BENZONATATE 100 MG PO CAPS: 100.0000 mg | ORAL_CAPSULE | Freq: Three times a day (TID) | ORAL | 0 refills | Status: DC

## 2023-10-26 MED ORDER — FLUTICASONE PROPIONATE 50 MCG/ACT NA SUSP: 2.0000 | Freq: Every day | NASAL | 0 refills | Status: AC

## 2023-10-26 MED ORDER — CETIRIZINE HCL 10 MG PO TABS: 10.0000 mg | ORAL_TABLET | Freq: Every day | ORAL | 0 refills | Status: DC

## 2023-10-26 NOTE — Telephone Encounter (Signed)
 Pt. Needs refill but did not leave what type of Rx

## 2023-10-26 NOTE — ED Triage Notes (Signed)
 Wife reports pt has a cough, nasal drainage, and lethargic  x 4 days

## 2023-10-26 NOTE — Discharge Instructions (Addendum)
 The COVID test was negative. Take medication as prescribed. Increase fluids and allow for plenty of rest. You may take over-the-counter Tylenol  as needed for pain, fever, or general discomfort. Recommend use of normal saline nasal spray throughout the day for nasal congestion and runny nose. For the cough, recommend use of a humidifier in the bedroom at nighttime during sleep and sleeping elevated on pillows while symptoms persist. Symptoms should begin to improve over the next 5 to 7 days.  If symptoms fail to improve, or begin to worsen, you may follow-up in this clinic or with your primary care physician for further evaluation. Follow-up as needed.

## 2023-10-26 NOTE — ED Provider Notes (Signed)
 [facility]    CSN: [id number] Arrival date & time: 10/26/23  1105      History   Chief Complaint Chief Complaint  Patient presents with   Cough    Seems to be upper chest. Low grade fever sometimes.Sinus drainage. - Entered by patient    HPI [name] [name] is a 78 y.o. male.   The history is provided by the patient.   Patient brought in by his spouse for complaints of cough, nasal congestion, and generalized fatigue.  She also reports low-grade temperature. Symptoms have been present for the past 4 days.  Denies fever, chills, headache, ear pain, ear drainage, wheezing, difficulty breathing, chest pain, abdominal pain, nausea, vomiting, diarrhea, or rash.  So far, patient has been taking over-the-counter medications for his symptoms.  Spouse states she is sick with the same or similar symptoms.  Past Medical History:  Diagnosis Date   Agent orange exposure    Anemia    BPH (benign prostatic hypertrophy)    Cancer (HCC)    basal cell temple   Complication of anesthesia    confusion and combative when waking up from last kidney procedure   Constipation    Depression    Fatigue    d/t Parkinson's   GERD (gastroesophageal reflux disease)    occasional   History of kidney stones    Left ureteral calculus    Lower urinary tract symptoms (LUTS)    Nephrolithiasis    Neuromuscular disorder (HCC)     parkinson's   Orthostatic hypotension    d/t Parkinson's disease   Orthostatic hypotension    Parkinson's disease (HCC)    Peyronie disease    Pneumonia 2018   Renal cyst, left    S/P deep brain stimulator placement    11-29-2012   Seizure (HCC)    pt and wife report that he has had seizures and is now on seizure medicine   Sleep behavior disorder, REM    Wears glasses    Wears hearing aid    BILATERAL-- INTERMITTANT WEARS    Patient Active Problem List   Diagnosis Date Noted   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   Right sided weakness 09/23/2023   Altered  mental status 09/16/2023   Brain mass 09/15/2023   Hypokalemia 09/15/2023   Fever 09/15/2023   Hypophonia 07/11/2023   Anemia 07/06/2023   Acute metabolic encephalopathy 07/06/2023   Nephrolithiasis 08/13/2022   Bladder stones 09/13/2020   Gross hematuria 09/12/2020   Orthostatic hypotension 10/27/2019   Acute pyelonephritis 12/16/2012   Sepsis (HCC) 12/15/2012   Parkinson's disease (HCC) 12/15/2012    Past Surgical History:  Procedure Laterality Date   APPLICATION OF CRANIAL NAVIGATION Left 10/11/2023   Procedure: COMPUTER-ASSISTED NAVIGATION, FOR CRANIAL PROCEDURE;  Surgeon: Lanis Pupa, MD;  Location: [facility];  Service: Neurosurgery;  Laterality: Left;   CATARACT EXTRACTION Bilateral    CHOLECYSTECTOMY  12/21/2014   Procedure: LAPAROSCOPIC CHOLECYSTECTOMY;  Surgeon: Lynda Leos, MD;  Location: [facility];  Service: General;;   COLONOSCOPY     CYSTO/  LEFT URETEROSOCPY/   LEFT URETERAL STENT PLACEMENT/  LASER BLADDER STONES AND EXTRACTION  09/25/2010   CYSTOSCOPY WITH INSERTION OF UROLIFT N/A 03/11/2017   Procedure: CYSTOSCOPY WITH INSERTION OF UROLIFT, LITHOPAXY,STONE OBTAINED;  Surgeon: Matilda Senior, MD;  Location: [facility];  Service: Urology;  Laterality: N/A;   CYSTOSCOPY WITH LITHOLAPAXY N/A 09/12/2020   Procedure: CYSTOSCOPY WITH LITHOLAPAXY, LEFT RETROGRADE PYELOGRAMS, LEFT URETEROSCOPY, AND STENT PLACEMENT;  Surgeon:  Cam Morene ORN, MD;  Location: [facility];  Service: Urology;  Laterality: N/A;  REQUESTING 2 HRS   CYSTOSCOPY WITH LITHOLAPAXY Right 07/14/2023   Procedure: CYSTOSCOPY, WITH BLADDER CALCULUS LITHOLAPAXY;  Surgeon: Cam Morene ORN, MD;  Location: [facility];  Service: Urology;  Laterality: Right;  CYSTOLITHOLAPAXY, RIGHT URETEROSCOPY, LASER LITHOTRIPSY, STONE EXTRACTION, RIGHT URETERAL STENT PLACEMENT   CYSTOSCOPY WITH RETROGRADE PYELOGRAM, URETEROSCOPY AND STENT PLACEMENT Left 01/06/2013   Procedure: CYSTOSCOPY WITH  RETROGRADE PYELOGRAM, URETEROSCOPY AND STENT PLACEMENT;  Surgeon: Thomasine Oiler, MD;  Location: [facility];  Service: Urology;  Laterality: Left;   CYSTOSCOPY WITH RETROGRADE PYELOGRAM, URETEROSCOPY AND STENT PLACEMENT Left 09/11/2014   Procedure: CYSTOSCOPY WITH LEFT RETROGRADE PYELOGRAM, URETEROSCOPY AND STENT PLACEMENT;  Surgeon: Oliva Oiler, MD;  Location: [facility];  Service: Urology;  Laterality: Left;   CYSTOSCOPY/RETROGRADE/URETEROSCOPY/STONE EXTRACTION WITH BASKET Right 07/14/2023   Procedure: CYSTOSCOPY, WITH CALCULUS REMOVAL USING BASKET;  Surgeon: Cam Morene ORN, MD;  Location: [facility];  Service: Urology;  Laterality: Right;   CYSTOSCOPY/URETEROSCOPY/HOLMIUM LASER/STENT PLACEMENT Right 07/14/2023   Procedure: CYSTOSCOPY/URETEROSCOPY/HOLMIUM LASER/STENT PLACEMENT;  Surgeon: Cam Morene ORN, MD;  Location: [facility];  Service: Urology;  Laterality: Right;   DEEP BRAIN STIMULATOR PLACEMENT  11/29/2012   genertor device at left pectoral area-   EXTRACORPOREAL SHOCK WAVE LITHOTRIPSY Left 10-11-2010  &  01-05-2011   HOLMIUM LASER APPLICATION Left 01/06/2013   Procedure: HOLMIUM LASER APPLICATION;  Surgeon: Thomasine Oiler, MD;  Location: [facility];  Service: Urology;  Laterality: Left;   HOLMIUM LASER APPLICATION Left 09/11/2014   Procedure: HOLMIUM LASER APPLICATION;  Surgeon: Oliva Oiler, MD;  Location: [facility];  Service: Urology;  Laterality: Left;   HOLMIUM LASER APPLICATION N/A 03/11/2017   Procedure: HOLMIUM LASER APPLICATION;  Surgeon: Matilda Senior, MD;  Location: [facility];  Service: Urology;  Laterality: N/A;   NEGATIVE SLEEP STUDY  2013  per pt   NEPHROLITHOTOMY Right 08/13/2022   Procedure: RIGHT NEPHROLITHOTOMY PERCUTANEOUS;  Surgeon: Cam Morene ORN, MD;  Location: [facility];  Service: Urology;  Laterality: Right;  210 MINUTES   PR DURAL GRAFT SPINAL Left 10/11/2023   Procedure:  FRAMELESS STEREOTACTIC BIOPSY;  Surgeon: Lanis Pupa, MD;  Location: [facility];  Service: Neurosurgery;  Laterality: Left;  STEREOTACTIC LEFT BRAIN BX   SP PERC NEPHROSTOMY Left 12/16/2012   STONE EXTRACTION WITH BASKET Left 01/06/2013   Procedure: STONE EXTRACTION WITH BASKET;  Surgeon: Thomasine Oiler, MD;  Location: [facility];  Service: Urology;  Laterality: Left;   TONSILLECTOMY     TRANSURETHRAL RESECTION OF PROSTATE N/A 09/12/2020   Procedure: TRANSURETHRAL RESECTION OF THE PROSTATE (TURP);  Surgeon: Cam Morene ORN, MD;  Location: [facility];  Service: Urology;  Laterality: N/A;       Home Medications    Prior to Admission medications   Medication Sig Start Date End Date Taking? Authorizing Provider  carbidopa -levodopa  (SINEMET  CR) 50-200 MG tablet Take 1 tablet by mouth at bedtime.    [provider]  carbidopa -levodopa  (SINEMET  IR) 25-100 MG tablet 2-2.5 tablets 4 times per day at 6am/10:30am/2pm/6pm Patient taking differently: Take 2 tablets by mouth 4 (four) times daily. 6am/10:30am/2pm/6pm 09/20/23   Vicci Afton CROME, MD  cyanocobalamin  1000 MCG tablet Take 1 tablet (1,000 mcg total) by mouth daily. Patient taking differently: Take 1,000 mcg by mouth daily. 4 times a week 07/11/23   Earley Saucer, MD  dexamethasone  (DECADRON ) 4 MG tablet Take  4 mg by mouth daily.    [provider]  fludrocortisone  (FLORINEF ) 0.1 MG tablet Take 2 tablets (0.2 mg total) by mouth 3 (three) times daily with meals. Patient not taking: Reported on 10/21/2023 07/10/23   Rizwan, Saima, MD  levETIRAcetam  (KEPPRA ) 500 MG tablet Take 1 tablet (500 mg total) by mouth 2 (two) times daily. 09/25/23   Elgergawy, Brayton RAMAN, MD  Levodopa  (INBRIJA ) 42 MG CAPS We are going to start inbrija .  Remember that TWO capsules is ONE dosage (never inhale just one capsule).  You can inhale the capsules as needed up to 5 times per day, separated by 2 hour  intervals. Patient taking differently: Place 1 capsule into inhaler and inhale as needed (can't walk, muscle rigidedness). We are going to start inbrija .  Remember that TWO capsules is ONE dosage (never inhale just one capsule).  You can inhale the capsules as needed up to 5 times per day, separated by 2 hour intervals. 08/30/23   Tat, Asberry RAMAN, DO  midodrine  (PROAMATINE ) 5 MG tablet Take 1 tablet (5 mg total) by mouth 3 (three) times daily with meals. Patient not taking: Reported on 10/21/2023 08/30/23   Tat, Asberry RAMAN, DO  pantoprazole  (PROTONIX ) 40 MG tablet Take 1 tablet (40 mg total) by mouth daily. 09/26/23   Elgergawy, Brayton RAMAN, MD    Family History Family History  Problem Relation Age of Onset   Hypertension Mother    Dementia Father    Cancer Father    Heart attack Sister    Arthritis/Rheumatoid Sister     Social History Social History   Tobacco Use   Smoking status: Never   Smokeless tobacco: Never  Vaping Use   Vaping status: Never Used  Substance Use Topics   Alcohol  use: Not Currently    Comment: rarely beer   Drug use: No     Allergies   Alfuzosin , Rivastigmine tartrate, and Tamsulosin   Review of Systems Review of Systems Per HPI  Physical Exam Triage Vital Signs ED Triage Vitals  Encounter Vitals Group     BP 10/26/23 1119 97/63     Girls Systolic BP Percentile --      Girls Diastolic BP Percentile --      Boys Systolic BP Percentile --      Boys Diastolic BP Percentile --      Pulse Rate 10/26/23 1119 85     Resp 10/26/23 1119 18     Temp 10/26/23 1119 98.2 F (36.8 C)     Temp Source 10/26/23 1119 Oral     SpO2 10/26/23 1119 93 %     Weight --      Height --      Head Circumference --      Peak Flow --      Pain Score 10/26/23 1122 0     Pain Loc --      Pain Education --      Exclude from Growth Chart --    No data found.  Updated Vital Signs BP 97/63 (BP Location: Right Arm)   Pulse 85   Temp 98.2 F (36.8 C) (Oral)   Resp 18    SpO2 93%   Visual Acuity Right Eye Distance:   Left Eye Distance:   Bilateral Distance:    Right Eye Near:   Left Eye Near:    Bilateral Near:     Physical Exam Vitals and nursing note reviewed.  Constitutional:      General:  He is not in acute distress.    Appearance: Normal appearance. He is well-developed.  HENT:     Head: Normocephalic and atraumatic.     Right Ear: Tympanic membrane, ear canal and external ear normal.     Left Ear: Tympanic membrane, ear canal and external ear normal.     Nose: Congestion present.     Right Turbinates: Enlarged and swollen.     Left Turbinates: Enlarged and swollen.     Right Sinus: No maxillary sinus tenderness or frontal sinus tenderness.     Left Sinus: No frontal sinus tenderness.     Mouth/Throat:     Lips: Pink.     Mouth: Mucous membranes are moist.     Pharynx: Postnasal drip present. No pharyngeal swelling, oropharyngeal exudate, posterior oropharyngeal erythema or uvula swelling.     Comments: Cobblestoning present to posterior oropharynx  Eyes:     Conjunctiva/sclera: Conjunctivae normal.     Pupils: Pupils are equal, round, and reactive to light.  Neck:     Thyroid : No thyromegaly.     Trachea: No tracheal deviation.  Cardiovascular:     Rate and Rhythm: Normal rate and regular rhythm.     Pulses: Normal pulses.     Heart sounds: Normal heart sounds.  Pulmonary:     Effort: Pulmonary effort is normal. No respiratory distress.     Breath sounds: Normal breath sounds. No stridor. No wheezing, rhonchi or rales.  Abdominal:     General: Bowel sounds are normal. There is no distension.     Palpations: Abdomen is soft.     Tenderness: There is no abdominal tenderness.  Musculoskeletal:     Cervical back: Normal range of motion and neck supple.  Skin:    General: Skin is warm and dry.  Neurological:     Mental Status: He is alert and oriented to person, place, and time.  Psychiatric:        Behavior: Behavior normal.         Thought Content: Thought content normal.        Judgment: Judgment normal.      UC Treatments / Results  Labs (all labs ordered are listed, but only abnormal results are displayed) Labs Reviewed  POC COVID19/FLU A&B COMBO    EKG   Radiology No results found.  Procedures Procedures (including critical care time)  Medications Ordered in UC Medications - No data to display  Initial Impression / Assessment and Plan / UC Course  I have reviewed the triage vital signs and the nursing notes.  Pertinent labs & imaging results that were available during my care of the patient were reviewed by me and considered in my medical decision making (see chart for details).  On exam, the patient's lung sounds are clear throughout, room air sats at 93%.  He is well-appearing, is in no acute distress, and vital signs are stable.  COVID test is negative.  Symptoms consistent with a viral URI with cough.  Will provide symptomatic treatment with fluticasone  50 mcg nasal spray and Tessalon  100 mg.  Supportive care recommendations were provided discussed with the patient's spouse to include fluids, rest, over-the-counter analgesics, use of normal saline nasal spray, and use of a humidifier during sleep.  Discussed indications with patient and spouse regarding follow-up.  Spouse was in agreement with this plan of care and verbalizes understanding.  All questions were answered.  Patient stable for discharge.   Final Clinical Impressions(s) / UC Diagnoses  Final diagnoses:  None   Discharge Instructions   None    ED Prescriptions   None    PDMP not reviewed this encounter.   Gilmer Etta PARAS, NP 10/26/23 [phone]

## 2023-10-28 ENCOUNTER — Telehealth: Payer: Self-pay | Admitting: *Deleted

## 2023-10-28 ENCOUNTER — Other Ambulatory Visit: Payer: Self-pay | Admitting: *Deleted

## 2023-10-28 ENCOUNTER — Encounter (HOSPITAL_COMMUNITY)
Admission: RE | Admit: 2023-10-28 | Discharge: 2023-10-28 | Disposition: A | Discharge status: Home / Self Care | Source: Ambulatory Visit | Attending: Internal Medicine | Admitting: Internal Medicine

## 2023-10-28 DIAGNOSIS — D496 Neoplasm of unspecified behavior of brain: Secondary | ICD-10-CM | POA: Diagnosis present

## 2023-10-28 LAB — GLUCOSE, CAPILLARY: Glucose-Capillary: 92 mg/dL (ref 70–99)

## 2023-10-28 MED ORDER — LEVETIRACETAM 500 MG PO TABS: 500.0000 mg | ORAL_TABLET | Freq: Two times a day (BID) | ORAL | 0 refills | Status: DC

## 2023-10-28 MED ORDER — FLUDEOXYGLUCOSE F - 18 (FDG) INJECTION
7.6000 | Freq: Once | INTRAVENOUS | Status: AC
  Administered 2023-10-28: 7.6 via INTRAVENOUS

## 2023-10-28 MED ORDER — PANTOPRAZOLE SODIUM 40 MG PO TBEC: 40.0000 mg | DELAYED_RELEASE_TABLET | Freq: Every day | ORAL | 0 refills | Status: DC

## 2023-10-28 NOTE — Telephone Encounter (Signed)
 PC to patient regarding appointment change on 11/04/23 - spoke with his wife [name].  Informed her we need to change phone appointment to 9:00 on 11/04/23, she verbalizes understanding.

## 2023-10-29 NOTE — Telephone Encounter (Signed)
 Called patients wife and she was wanting to know about the Keppra  but she has a refill

## 2023-11-03 LAB — SURGICAL PATHOLOGY

## 2023-11-04 ENCOUNTER — Inpatient Hospital Stay: Attending: Neurosurgery | Admitting: Internal Medicine

## 2023-11-04 ENCOUNTER — Telehealth: Admitting: Internal Medicine

## 2023-11-04 ENCOUNTER — Inpatient Hospital Stay: Admitting: Internal Medicine

## 2023-11-04 DIAGNOSIS — D496 Neoplasm of unspecified behavior of brain: Secondary | ICD-10-CM | POA: Diagnosis not present

## 2023-11-04 DIAGNOSIS — C8339 Primary central nervous system lymphoma: Secondary | ICD-10-CM | POA: Insufficient documentation

## 2023-11-04 MED ORDER — DEXAMETHASONE 1 MG PO TABS: ORAL_TABLET | ORAL | 0 refills | Status: DC

## 2023-11-04 NOTE — Progress Notes (Signed)
 I connected with [name] [name] on 11/04/23 at 11:45 AM EDT by telephone visit and verified that I am speaking with the correct person using two identifiers.  I discussed the limitations, risks, security and privacy concerns of performing an evaluation and management service by telemedicine and the availability of in-person appointments. I also discussed with the patient that there may be a patient responsible charge related to this service. The patient expressed understanding and agreed to proceed.   Other persons participating in the visit and their role in the encounter:  spouse  Patient's location:  Home Provider's location:  Office Chief Complaint:  Brain tumor [facility])  History of Present Ilness: [name] [name] reports no clinical changes today.  [name] [name]  4mg  daily.  No seizures or headaches.  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 EXAM: NUCLEAR MEDICINE PET WHOLE BODY TECHNIQUE: 7.6 mCi F-18 FDG was injected intravenously. Full-ring PET imaging was performed from the head to foot after the radiotracer. CT data was obtained and used for attenuation correction and anatomic localization. Fasting blood glucose: 92 mg/dl COMPARISON:  Brain MRI 10/20/2023 FINDINGS: HEAD/NECK: Rounded foci of metabolic activity in the deep white matter of the LEFT and RIGHT cerebral hemispheres corresponds to the MRI findings. This is a nonspecific finding. (Image 19) Incidental CT findings: none CHEST: Focus airspace density consolidation in the posterior RIGHT upper lobe measuring 2.3 x 1.4 cm has associated metabolic activity SUV max equal 4.4. Peribronchial thickening in the LEFT infrahilar lung is also mildly metabolic with SUV max equal 4.0 on image 118. No hypermetabolic mediastinal lymph nodes. Incidental CT findings: none  ABDOMEN/PELVIS: Low-density lesion posterior RIGHT hepatic lobe (image 134) without metabolic activity. No hypermetabolic abdominopelvic lymph nodes. Incidental CT findings: Multiple bilateral nonobstructing renal calculi. Enlarged prostate gland. SKELETON: No focal hypermetabolic activity to suggest skeletal metastasis. Incidental CT findings: none EXTREMITIES: No abnormal hypermetabolic activity in the lower extremities. Incidental CT findings: none IMPRESSION: 1. Metabolic activity associated with the bihemispheric findings on comparison MRI. This is nonspecific finding. 2. No evidence of extracranial lymphoma. 3. Metabolic activity associated with airspace disease in the RIGHT upper lobe and peribronchial thickening in LEFT lower lobe is favored infectious or inflammatory. Electronically Signed   By: Jackquline Boxer M.D.   On: 10/31/2023 13:30   MR BRAIN W WO CONTRAST Result Date: 10/20/2023 CLINICAL DATA:  Brain/CNS neoplasm, assess treatment response. Status post left frontal brain biopsy with preliminary pathology demonstrating abnormal hypercellularity suspicious for tumor, pending outside consultation. EXAM: MRI HEAD WITHOUT AND WITH CONTRAST TECHNIQUE: Multiplanar, multiecho pulse sequences of the brain and surrounding structures were obtained without and with intravenous contrast. CONTRAST:  7mL GADAVIST  GADOBUTROL  1 MMOL/ML IV SOLN COMPARISON:  Head CT 09/24/2023 and MRI 09/15/2023 FINDINGS: Brain: There is persistent extensive, confluent T2 FLAIR hyperintensity in the left cerebral hemispheric white matter, greatest in the frontoparietal region but extending inferiorly into the temporal lobe and deep white matter tracts at the posterior left basal ganglia as well. T2 FLAIR hyperintensity also extends across the corpus callosum into the right frontoparietal and to a lesser extent temporal white matter. T2 hyperintensity has overall mildly improved from the prior motion degraded MRI, most notably the  more inferiorly located components in the left hemisphere, and mild mass effect on the prior MRI has resolved. There are scattered small nodular  foci of enhancement within these areas of T2 signal abnormality in both cerebral hemispheres, however the overall volume of enhancement has greatly decreased from the prior MRI. There is persistent mild trace diffusion weighted signal hyperintensity in the left greater than right cerebral hemispheres which appears to reflect a combination of T2 shine through as well as some areas of truly mildly restricted diffusion. No progressive findings are evident. A new small focus of blood products in the posterior left frontal lobe is related to the recent biopsy. Bilateral deep brain stimulators remain in place. Mild cerebral atrophy is within normal limits for age. No midline shift, hydrocephalus, or extra-axial fluid collection is identified. Vascular: Major intracranial vascular flow voids are preserved. Skull and upper cervical spine: Unremarkable bone marrow signal. Sinuses/Orbits: Bilateral cataract extraction. Paranasal sinuses and mastoid air cells are clear. Other: None. IMPRESSION: Greatly decreased enhancement and mildly improved T2 signal abnormality of the bihemispheric abnormality, potentially related to steroid therapy. 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 Electronically Signed   By: Dasie Hamburg M.D.   On: 10/20/2023 17:07    PATHOLOGY SURGICAL PATHOLOGY * THIS IS AN AMENDED REPORT * CASE: [id number] PATIENT: [name] [name] Surgical Pathology Report * Amendment*  Reason for Amendment #1: Outside consultation  Clinical History: brain tumor (cm)     FINAL MICROSCOPIC DIAGNOSIS:  A. BRAIN TUMOR, BIOPSY: Abnormal hypercellular neuropil with a diffuse lymphohistiocytic infiltrate involving gray and white matter (see comment #2)  COMMENT #1:  Sections show cores of brain exhibiting increased  cellularity and a focally prominent vasculature.  There is a patchy perivascular cuffing of small mature appearing lymphocytes.  Focally these lymphocytes have associated hemosiderin laden macrophages.  Otherwise, the background neuropil appears vacuolated.  No mitotic activity or necrosis is seen. This case will be forwarded to [facility] neuropathology for outside consultation and their expert opinion.  An amended diagnosis will be issued upon the receipt of this consult.  Status of the case is conveyed to Dr. Lanis by Dr. Reed via [employer] on 10/13/2023.  COMMENT #2:  This biopsy was reviewed at [facility] by Dr. 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  [name] describes a diffuse lymphohistiocytic infiltrate involving gray-white matter with features as described above.  Immunohistochemical stains performed at [facility] reveals scattered CD20 PAX5 positive B cells, a few of which appeared atypical.  The cells were associated with an increased proliferation index.  The adjacent glial cells show mild nuclear atypia compatible with reactive changes.  Multiple stains for infectious etiologies including toxoplasma, syphilis, Whipple disease, adenovirus, HSV 1/2 and CMV in addition to traditional Gram, GMS and acid-fast bacilli stains were performed and were all negative. As noted, [name] said the findings are nonspecific, but the presence of atypical B cells is suspicious for a possible lymphoproliferative disorder that is masked by corticosteroid therapy.  Otherwise, this is a destructive macrophage rich inflammatory lesion that could be due to an infectious, autoimmune/inflammatory or paraneoplastic process.  Also, [name] could not exclude these changes are adjacent to an unsampled primary neoplastic process.  [name] recommends a liquid biopsy or resampling post steroid  therapy with microbiologic testing. A summary of the consultative report is conveyed to Ds. Nundkumar and Rhett Najera by Dr. Reed via [employer] on 11/03/2023.   Assessment and Plan: Brain tumor (HCC)  Clinically stable.  Reviewed PET results which were negative.  Pathology report from [facility] was reviewed; it was non-diagnostic but did not rule out false negative CNS lymphoma from steroid effect, as discussed previously.  Discussed and recommended just repeating an MRI brain in 1 month, using imaging to guide treatment planning in absence of definitive pathology specimen.  They are agreeable with this.  Recommended decreasing [name]  by 1mg  each week, starting with 3mg  daily tomorrow.  Dose may be modified if focal symptoms recur.  Follow Up Instructions: RTC in 1 month following next MRI study.  I discussed the assessment and treatment plan with the patient.  The patient was provided an opportunity to ask questions and all were answered.  The patient agreed with the plan and demonstrated understanding of the instructions.    The patient was advised to call back or seek an in-person evaluation if the symptoms worsen or if the condition fails to improve as anticipated.    Oziah Vitanza K Melba Araki, MD   I provided 20 minutes of non face-to-face telephone visit time during this encounter, and > 50% was spent counseling as documented under my assessment & plan.

## 2023-11-11 ENCOUNTER — Telehealth: Payer: Self-pay | Admitting: Internal Medicine

## 2023-11-11 NOTE — Telephone Encounter (Signed)
 Scheduled patient for next appointment. Called and spoke with the patients wife, she is aware.

## 2023-11-18 ENCOUNTER — Telehealth: Payer: Self-pay | Admitting: *Deleted

## 2023-11-18 ENCOUNTER — Other Ambulatory Visit: Payer: Self-pay | Admitting: Internal Medicine

## 2023-11-18 MED ORDER — TRAZODONE HCL 50 MG PO TABS: 50.0000 mg | ORAL_TABLET | Freq: Every day | ORAL | 1 refills | Status: DC

## 2023-11-18 NOTE — Telephone Encounter (Signed)
 PC to patient's wife, [name] [name] - informed her Dr [name] has sent in rx for trazadone 50 mg to be taken at bedtime.  She verbalizes understanding.

## 2023-11-18 NOTE — Telephone Encounter (Signed)
-----   Message from Arthea MARLA Manns sent at 11/18/2023  1:39 PM EDT ----- Can try trazodone 50mg  hs ----- Message ----- From: Marget Rudell ORN, RN Sent: 11/18/2023   1:35 PM EDT To: Zachary K Vaslow, MD  This patient's wife called, She is asking for some help, [name] [name] is not sleeping at all, she says he seems to be awake every 10-30 minutes, if he is sleeping at all.  She says he yells out for help & she thinks he may be having hallucinations.  This has been happening the last 3 nights so she is asking if he can have anything for sleep, they are both currently very sleep deprived.  She reports he is currently down to 1 mg of dex daily & his R leg weakness has increased, he is quite unsteady.  Please advise.

## 2023-11-20 ENCOUNTER — Other Ambulatory Visit: Payer: Self-pay | Admitting: Internal Medicine

## 2023-11-20 DIAGNOSIS — D496 Neoplasm of unspecified behavior of brain: Secondary | ICD-10-CM

## 2023-11-22 ENCOUNTER — Telehealth: Payer: Self-pay | Admitting: *Deleted

## 2023-11-22 ENCOUNTER — Other Ambulatory Visit: Payer: Self-pay | Admitting: Internal Medicine

## 2023-11-22 MED ORDER — DEXAMETHASONE 4 MG PO TABS: 4.0000 mg | ORAL_TABLET | Freq: Every day | ORAL | 1 refills | Status: DC

## 2023-11-22 NOTE — Telephone Encounter (Signed)
-----   Message from Arthea MARLA Manns sent at 11/22/2023  2:10 PM EDT ----- Yes let's get scan and visit moved up.  We can increase decadron  to 4mg  daily in the interim ----- Message ----- From: Marget Rudell ORN, RN Sent: 11/22/2023  11:25 AM EDT To: Arthea MARLA Manns, MD  His wife called, she says she & the family have seen some changes with [name] [name].  He is more confused, they are having trouble getting him up - her son has been helping with that as much as possible. He is taking the trazadone for sleep which has helped some but she says he wakes up frequently & is scared.  He is currently taking dex 1 mg daily.  He has MRI & appt with you the first week of November, she is asking if he needs to be seen sooner.

## 2023-11-22 NOTE — Telephone Encounter (Signed)
 PC to patient's wife, [name] - informed her of Dr Eward advice below.  He has sent in rx for Decadron  4 mg daily.  MRI appointment changed to 11/25/23 at 12:00, MD appointment changed to 11/29/23 at 11:30.  She verbalizes understanding.

## 2023-11-25 ENCOUNTER — Ambulatory Visit (HOSPITAL_COMMUNITY)
Admission: RE | Admit: 2023-11-25 | Discharge: 2023-11-25 | Disposition: A | Discharge status: Home / Self Care | Source: Ambulatory Visit | Attending: Internal Medicine | Admitting: Internal Medicine

## 2023-11-25 DIAGNOSIS — D496 Neoplasm of unspecified behavior of brain: Secondary | ICD-10-CM | POA: Diagnosis present

## 2023-11-25 MED ORDER — GADOBUTROL 1 MMOL/ML IV SOLN
7.0000 mL | Freq: Once | INTRAVENOUS | Status: AC | PRN
  Administered 2023-11-25: 7 mL via INTRAVENOUS

## 2023-11-28 ENCOUNTER — Other Ambulatory Visit: Payer: Self-pay | Admitting: Internal Medicine

## 2023-11-28 DIAGNOSIS — D496 Neoplasm of unspecified behavior of brain: Secondary | ICD-10-CM

## 2023-11-29 ENCOUNTER — Other Ambulatory Visit: Payer: Self-pay | Admitting: Radiation Therapy

## 2023-11-29 ENCOUNTER — Inpatient Hospital Stay (HOSPITAL_BASED_OUTPATIENT_CLINIC_OR_DEPARTMENT_OTHER): Admitting: Internal Medicine

## 2023-11-29 VITALS — BP 113/83 | HR 73 | Temp 97.3°F | Resp 18

## 2023-11-29 DIAGNOSIS — C8339 Primary central nervous system lymphoma: Secondary | ICD-10-CM | POA: Diagnosis present

## 2023-11-29 MED ORDER — DEXAMETHASONE 4 MG PO TABS: 4.0000 mg | ORAL_TABLET | Freq: Every day | ORAL | 1 refills | Status: DC

## 2023-11-29 MED ORDER — LAMOTRIGINE 100 MG PO TABS: 100.0000 mg | ORAL_TABLET | Freq: Every day | ORAL | 1 refills | Status: DC

## 2023-11-29 MED ORDER — LAMOTRIGINE 25 MG PO TABS: ORAL_TABLET | ORAL | 0 refills | Status: DC

## 2023-11-29 NOTE — Progress Notes (Signed)
 [facility] [address]  [place], [place] [address] [phone]   Interval Evaluation  Date of Service: 11/29/23 Patient Name: [name] [name] Patient MRN: [id number] Patient DOB: 14-Feb-1945 Provider: Arthea MARLA Manns, MD  Identifying Statement:  [name] [name] is a 78 y.o. male with left frontal primary CNS lymphoma  Oncologic History: 10/11/23: Stereotactic biopsy with Dr. Lanis; path is indeterminate but suggestive of lymphoma   Biomarkers:  MGMT Unknown.  IDH 1/2 Unknown.  EGFR Unknown  TERT Unknown   Interval History: [name] [name] presents today for follow up after recent MRI brain.  He and his family describe overall decline in function since prior visit.  He is no longer using the walker to walk due to poor balance, only ambulating with a wheelchair.  Family has reported some visual hallucinations, and irritability.  Sleep volume has increased overall but is irregular at night time.  Right side remains somewhat weak.  No frank seizures, headaches.  Continues on Keppra  500mg  twice per day, decadron  4mg  in the morning.  No other medication changes.  H+P (09/28/23) Patient presented to neurologic attention this month with several days history of slurred speech and right sided weakness.  He also had seizure episode described as staring head turned to left, followed by 1-2 days of confusion.  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  Was started on decadron  4mg  twice per day, which has ultimately not led to improvement in his symptoms.  He has baseline mobility issues from Parkinson's disease, had a deep brain stimulator placed in 2014.  Keppra  remains at 500mg  twice per day without issue.  Medications: Current Outpatient Medications on File Prior to Visit  Medication Sig Dispense Refill   benzonatate  (TESSALON ) 100 MG capsule Take 1 capsule (100 mg total) by mouth every 8 (eight) hours. 21 capsule 0    carbidopa -levodopa  (SINEMET  CR) 50-200 MG tablet Take 1 tablet by mouth at bedtime.     carbidopa -levodopa  (SINEMET  IR) 25-100 MG tablet 2-2.5 tablets 4 times per day at 6am/10:30am/2pm/6pm (Patient taking differently: Take 2 tablets by mouth 4 (four) times daily. 6am/10:30am/2pm/6pm)     cetirizine  (ZYRTEC ) 10 MG tablet Take 1 tablet (10 mg total) by mouth daily. 30 tablet 0   cyanocobalamin  1000 MCG tablet Take 1 tablet (1,000 mcg total) by mouth daily. (Patient taking differently: Take 1,000 mcg by mouth daily. 4 times a week) 30 tablet 0   dexamethasone  (DECADRON ) 4 MG tablet Take 1 tablet (4 mg total) by mouth daily. 60 tablet 1   fludrocortisone  (FLORINEF ) 0.1 MG tablet Take 2 tablets (0.2 mg total) by mouth 3 (three) times daily with meals. (Patient not taking: Reported on 10/21/2023) 180 tablet 0   fluticasone  (FLONASE ) 50 MCG/ACT nasal spray Place 2 sprays into both nostrils daily. 16 g 0   levETIRAcetam  (KEPPRA ) 500 MG tablet Take 1 tablet (500 mg total) by mouth 2 (two) times daily. 60 tablet 0   Levodopa  (INBRIJA ) 42 MG CAPS We are going to start inbrija .  Remember that TWO capsules is ONE dosage (never inhale just one capsule).  You can inhale the capsules as needed up to 5 times per day, separated by 2 hour intervals. (Patient taking differently: Place 1 capsule into inhaler and inhale as needed (can't walk, muscle rigidedness). We are going to start inbrija .  Remember that TWO capsules is ONE dosage (never inhale just one capsule).  You can inhale the capsules as needed  up to 5 times per day, separated by 2 hour intervals.)     midodrine  (PROAMATINE ) 5 MG tablet Take 1 tablet (5 mg total) by mouth 3 (three) times daily with meals. (Patient not taking: Reported on 10/21/2023) 270 tablet 1   pantoprazole  (PROTONIX ) 40 MG tablet TAKE 1 TABLET BY MOUTH EVERY DAY 90 tablet 1   traZODone (DESYREL) 50 MG tablet Take 1 tablet (50 mg total) by mouth at bedtime. 30 tablet 1   No current  facility-administered medications on file prior to visit.    Allergies:  Allergies  Allergen Reactions   Alfuzosin  Other (See Comments)    Hypotension    Rivastigmine Tartrate Nausea And Vomiting     Can tolerate the patch but can not tolerate the pills   Tamsulosin Other (See Comments)    Severe joint pain   Past Medical History:  Past Medical History:  Diagnosis Date   Agent orange exposure    Anemia    BPH (benign prostatic hypertrophy)    Cancer (HCC)    basal cell temple   Complication of anesthesia    confusion and combative when waking up from last kidney procedure   Constipation    Depression    Fatigue    d/t Parkinson's   GERD (gastroesophageal reflux disease)    occasional   History of kidney stones    Left ureteral calculus    Lower urinary tract symptoms (LUTS)    Nephrolithiasis    Neuromuscular disorder (HCC)     parkinson's   Orthostatic hypotension    d/t Parkinson's disease   Orthostatic hypotension    Parkinson's disease (HCC)    Peyronie disease    Pneumonia 2018   Renal cyst, left    S/P deep brain stimulator placement    11-29-2012   Seizure (HCC)    pt and wife report that he has had seizures and is now on seizure medicine   Sleep behavior disorder, REM    Wears glasses    Wears hearing aid    BILATERAL-- INTERMITTANT WEARS   Past Surgical History:  Past Surgical History:  Procedure Laterality Date   APPLICATION OF CRANIAL NAVIGATION Left 10/11/2023   Procedure: COMPUTER-ASSISTED NAVIGATION, FOR CRANIAL PROCEDURE;  Surgeon: Lanis Pupa, MD;  Location: [facility];  Service: Neurosurgery;  Laterality: Left;   CATARACT EXTRACTION Bilateral    CHOLECYSTECTOMY  12/21/2014   Procedure: LAPAROSCOPIC CHOLECYSTECTOMY;  Surgeon: Lynda Leos, MD;  Location: [facility] OR;  Service: General;;   COLONOSCOPY     CYSTO/  LEFT URETEROSOCPY/   LEFT URETERAL STENT PLACEMENT/  LASER BLADDER STONES AND EXTRACTION  09/25/2010   CYSTOSCOPY WITH INSERTION OF  UROLIFT N/A 03/11/2017   Procedure: CYSTOSCOPY WITH INSERTION OF UROLIFT, LITHOPAXY,STONE OBTAINED;  Surgeon: Matilda Senior, MD;  Location: [facility];  Service: Urology;  Laterality: N/A;   CYSTOSCOPY WITH LITHOLAPAXY N/A 09/12/2020   Procedure: CYSTOSCOPY WITH LITHOLAPAXY, LEFT RETROGRADE PYELOGRAMS, LEFT URETEROSCOPY, AND STENT PLACEMENT;  Surgeon: Cam Morene ORN, MD;  Location: [facility];  Service: Urology;  Laterality: N/A;  REQUESTING 2 HRS   CYSTOSCOPY WITH LITHOLAPAXY Right 07/14/2023   Procedure: CYSTOSCOPY, WITH BLADDER CALCULUS LITHOLAPAXY;  Surgeon: Cam Morene ORN, MD;  Location: [facility];  Service: Urology;  Laterality: Right;  CYSTOLITHOLAPAXY, RIGHT URETEROSCOPY, LASER LITHOTRIPSY, STONE EXTRACTION, RIGHT URETERAL STENT PLACEMENT   CYSTOSCOPY WITH RETROGRADE PYELOGRAM, URETEROSCOPY AND STENT PLACEMENT Left 01/06/2013   Procedure: CYSTOSCOPY WITH RETROGRADE PYELOGRAM, URETEROSCOPY AND STENT PLACEMENT;  Surgeon:  Thomasine Oiler, MD;  Location: [facility];  Service: Urology;  Laterality: Left;   CYSTOSCOPY WITH RETROGRADE PYELOGRAM, URETEROSCOPY AND STENT PLACEMENT Left 09/11/2014   Procedure: CYSTOSCOPY WITH LEFT RETROGRADE PYELOGRAM, URETEROSCOPY AND STENT PLACEMENT;  Surgeon: Oliva Oiler, MD;  Location: [facility];  Service: Urology;  Laterality: Left;   CYSTOSCOPY/RETROGRADE/URETEROSCOPY/STONE EXTRACTION WITH BASKET Right 07/14/2023   Procedure: CYSTOSCOPY, WITH CALCULUS REMOVAL USING BASKET;  Surgeon: Cam Morene ORN, MD;  Location: [facility];  Service: Urology;  Laterality: Right;   CYSTOSCOPY/URETEROSCOPY/HOLMIUM LASER/STENT PLACEMENT Right 07/14/2023   Procedure: CYSTOSCOPY/URETEROSCOPY/HOLMIUM LASER/STENT PLACEMENT;  Surgeon: Cam Morene ORN, MD;  Location: [facility];  Service: Urology;  Laterality: Right;   DEEP BRAIN STIMULATOR PLACEMENT  11/29/2012   genertor device at left pectoral area-    EXTRACORPOREAL SHOCK WAVE LITHOTRIPSY Left 10-11-2010  &  01-05-2011   HOLMIUM LASER APPLICATION Left 01/06/2013   Procedure: HOLMIUM LASER APPLICATION;  Surgeon: Thomasine Oiler, MD;  Location: [facility] [place];  Service: Urology;  Laterality: Left;   HOLMIUM LASER APPLICATION Left 09/11/2014   Procedure: HOLMIUM LASER APPLICATION;  Surgeon: Oliva Oiler, MD;  Location: [facility];  Service: Urology;  Laterality: Left;   HOLMIUM LASER APPLICATION N/A 03/11/2017   Procedure: HOLMIUM LASER APPLICATION;  Surgeon: Matilda Senior, MD;  Location: [facility];  Service: Urology;  Laterality: N/A;   NEGATIVE SLEEP STUDY  2013  per pt   NEPHROLITHOTOMY Right 08/13/2022   Procedure: RIGHT NEPHROLITHOTOMY PERCUTANEOUS;  Surgeon: Cam Morene ORN, MD;  Location: [facility];  Service: Urology;  Laterality: Right;  210 MINUTES   PR DURAL GRAFT SPINAL Left 10/11/2023   Procedure: FRAMELESS STEREOTACTIC BIOPSY;  Surgeon: Lanis Pupa, MD;  Location: [facility];  Service: Neurosurgery;  Laterality: Left;  STEREOTACTIC LEFT BRAIN BX   SP PERC NEPHROSTOMY Left 12/16/2012   STONE EXTRACTION WITH BASKET Left 01/06/2013   Procedure: STONE EXTRACTION WITH BASKET;  Surgeon: Thomasine Oiler, MD;  Location: [facility];  Service: Urology;  Laterality: Left;   TONSILLECTOMY     TRANSURETHRAL RESECTION OF PROSTATE N/A 09/12/2020   Procedure: TRANSURETHRAL RESECTION OF THE PROSTATE (TURP);  Surgeon: Cam Morene ORN, MD;  Location: [facility];  Service: Urology;  Laterality: N/A;   Social History:  Social History   Socioeconomic History   Marital status: Married    Spouse name: Not on file   Number of children: Not on file   Years of education: Not on file   Highest education level: Not on file  Occupational History   Not on file  Tobacco Use   Smoking status: Never   Smokeless tobacco: Never  Vaping Use   Vaping status: Never Used   Substance and Sexual Activity   Alcohol  use: Not Currently    Comment: rarely beer   Drug use: No   Sexual activity: Not Currently  Other Topics Concern   Not on file  Social History Narrative   Not on file   Social Drivers of Health   Financial Resource Strain: Not on file  Food Insecurity: No Food Insecurity (10/11/2023)   Hunger Vital Sign    Worried About Running Out of Food in the Last Year: Never true    Ran Out of Food in the Last Year: Never true  Transportation Needs: No Transportation Needs (10/11/2023)   PRAPARE - Administrator, Civil Service (Medical): No    Lack of Transportation (Non-Medical): No  Physical Activity:  Not on file  Stress: Not on file  Social Connections: Moderately Isolated (10/11/2023)   Social Connection and Isolation Panel    Frequency of Communication with Friends and Family: Three times a week    Frequency of Social Gatherings with Friends and Family: Three times a week    Attends Religious Services: Never    Active Member of Clubs or Organizations: No    Attends Banker Meetings: Never    Marital Status: Married  Catering Manager Violence: Not At Risk (10/11/2023)   Humiliation, Afraid, Rape, and Kick questionnaire    Fear of Current or Ex-Partner: No    Emotionally Abused: No    Physically Abused: No    Sexually Abused: No   Family History:  Family History  Problem Relation Age of Onset   Hypertension Mother    Dementia Father    Cancer Father    Heart attack Sister    Arthritis/Rheumatoid Sister     Review of Systems: Constitutional: Doesn't report fevers, chills or abnormal weight loss Eyes: Doesn't report blurriness of vision Ears, nose, mouth, throat, and face: Doesn't report sore throat Respiratory: Doesn't report cough, dyspnea or wheezes Cardiovascular: Doesn't report palpitation, chest discomfort  Gastrointestinal:  Doesn't report nausea, constipation, diarrhea GU: Doesn't report incontinence Skin:  Doesn't report skin rashes Neurological: Per HPI Musculoskeletal: Doesn't report joint pain Behavioral/Psych: Doesn't report anxiety  Physical Exam: Vitals:   11/29/23 1114  BP: 113/83  Pulse: 73  Resp: 18  Temp: (!) 97.3 F (36.3 C)  SpO2: 98%    KPS: 60. General: Alert, cooperative, pleasant, in no acute distress Head: Normal EENT: No conjunctival injection or scleral icterus.  Lungs: Resp effort normal Cardiac: Regular rate Abdomen: Non-distended abdomen Skin: No rashes cyanosis or petechiae. Extremities: No clubbing or edema  Neurologic Exam: Mental Status: Awake, alert, attentive to examiner. Oriented to self and environment. Language is fluent with intact comprehension.  Pyschomotor slowing. Hypophonic Cranial Nerves: Visual acuity is grossly normal. Visual fields are full. Extra-ocular movements intact. No ptosis. Face is symmetric Motor: Parkinsonian tremor with cogwheeling. Power is 4/5 in right arm and leg. Reflexes are symmetric, no pathologic reflexes present.  Sensory: Intact to light touch Gait: Non ambulatory   Labs: I have reviewed the data as listed    Component Value Date/Time   NA 134 (L) 09/25/2023 0527   K 4.6 09/25/2023 0527   CL 108 09/25/2023 0527   CO2 25 09/25/2023 0527   GLUCOSE 129 (H) 09/25/2023 0527   BUN 33 (H) 09/25/2023 0527   CREATININE 0.96 09/25/2023 0527   CALCIUM  8.3 (L) 09/25/2023 0527   PROT 5.6 (L) 09/23/2023 1714   ALBUMIN 2.9 (L) 09/23/2023 1714   AST 21 09/23/2023 1714   ALT 10 09/23/2023 1714   ALKPHOS 58 09/23/2023 1714   BILITOT 0.8 09/23/2023 1714   GFRNONAA >60 09/25/2023 0527   GFRAA >60 12/12/2014 1056   Lab Results  Component Value Date   WBC 14.6 (H) 09/25/2023   NEUTROABS 12.0 (H) 09/23/2023   HGB 13.0 09/25/2023   HCT 37.5 (L) 09/25/2023   MCV 93.8 09/25/2023   PLT 370 09/25/2023   Imaging:  [facility] Clinician Interpretation: I have personally reviewed the CNS images as listed.  My interpretation, in  the context of the patient's clinical presentation, is progressive disease  MR BRAIN W WO CONTRAST Result Date: 11/25/2023 EXAM: MRI BRAIN WITH AND WITHOUT CONTRAST 11/25/2023 12:38:35 PM TECHNIQUE: Multiplanar multisequence MRI of the head/brain was performed  with and without the administration of 7 mL of gadobutrol  (GADAVIST ) 1 MMOL/ML injection 7 mL GADOBUTROL  1 MMOL/ML IV SOLN. COMPARISON: Head MRI 10/20/2023 and 09/15/2023. CLINICAL HISTORY: Brain/CNS neoplasm, assess treatment response. FINDINGS: LIMITATIONS: The examination is mildly motion degraded. BRAIN AND VENTRICLES: Extensive, confluent T2 FLAIR hyperintensity is again seen in the left greater than right cerebral hemispheric white matter extending across the corpus callosum. This has progressed from 10/20/2023, most notably in the high frontoparietal regions and corpus callosum, however it remains improved from 09/15/2023. Likewise, numerous areas of patchy and nodular enhancement within the T2 signal abnormality have also progressed from 10/20/2023 but overall remain less than on 09/15/2023. Associated mild diffusion weighted signal abnormality remains. There is no midline shift or other significant mass effect. A small focus of encephalomalacia and chronic blood products are again noted in the posterior left frontal lobe at a biopsy site. No acute infarct, hydrocephalus, or extra axial fluid collection is evident. Deep brain stimulators remain in place. There is mild cerebral atrophy. Major intracranial vascular flow voids are preserved. ORBITS: Bilateral cataract extraction. SINUSES: Small bilateral mastoid effusions. Clear paranasal sinuses. BONES AND SOFT TISSUES: Normal bone marrow signal and enhancement. No acute soft tissue abnormality. IMPRESSION: 1. Widespread bihemispheric T2 signal abnormality and patchy enhancement, worsened from 10/20/2023 though remaining improved from 09/15/2023. Electronically signed by: Dasie Hamburg MD 11/25/2023  01:29 PM EDT RP Workstation: [id number]   Pathology:  This biopsy was reviewed at [facility] by Dr. Duwaine who following a consult rendered a diagnosis of atypical B-cell infiltrate; however, in his summary he states the findings are nonspecific.  He describes a diffuse lymphohistiocytic infiltrate involving gray-white matter with features as described above.  Immunohistochemical stains performed at [facility] reveals scattered CD20 PAX5 positive B cells, a few of which appeared atypical.  The cells were associated with an increased proliferation index.  The adjacent glial cells show mild nuclear atypia compatible with reactive changes.  Multiple stains for infectious etiologies including toxoplasma, syphilis, Whipple disease, adenovirus, HSV 1/2 and CMV in addition to traditional Gram, GMS and acid-fast bacilli stains were performed and were all negative. As noted, he said the findings are nonspecific, but the presence of atypical B cells is suspicious for a possible lymphoproliferative disorder that is masked by corticosteroid therapy.  Otherwise, this is a destructive macrophage rich inflammatory lesion that could be due to an infectious, autoimmune/inflammatory or paraneoplastic process.  Also, he could not exclude these changes are adjacent to an unsampled primary neoplastic process.  He recommends a liquid biopsy or resampling post steroid therapy with microbiologic testing. A summary of the consultative report is conveyed to Ds. Nundkumar and Zyheir Daft by Dr. Reed via [employer] on 11/03/2023.    Assessment/Plan Primary CNS lymphoma (HCC)  [name] [name] is clinically progressive today, with decline in motor function and some cognitive changes.  Brain MRI demonstrates progression of disease, with increased burden of enhancement, T2/FLAIR signal abnormality within the left hemisphere.  These findings, when combined with pathology report from [name], as well as other  staging, are most consistent with primary CNS lymphoma.  It is likely that biopsy histology was subject to sampling error from corticosteroid use.    Discussed and recommended treatment with whole brain radiation therapy given his poor functional status, age.    We will refer to radiation oncology ASAP, would anticipate 40-50Gy treatment dose likely delivered over 3 weeks.  Also recommended swapping out Keppra  for Lamictal given hallucinations, irritability.  Recommended dosing  25mg  daily x7 days, then 50mg  daily x7 days, then 100mg  daily.  Once at 100mg  dose level, can stop the Keppra .    Family feels he was a little better when on the 8mg  daily decadron .  We are agreeable with a trial of higher dose steroids for a time, given his functional decline.  They are agreeable also with consult/visit with palliative care team, given extensive needs at home.  We ask that [name] [name] return to clinic in 2- weeks during middle of radiation therapy, or sooner as needed.  All questions were answered. The patient knows to call the clinic with any problems, questions or concerns. No barriers to learning were detected.  The total time spent in the encounter was 40 minutes and more than 50% was on counseling and review of test results   Arthea MARLA Manns, MD Medical Director of Neuro-Oncology [facility] 11/29/23 11:14 AM

## 2023-12-01 ENCOUNTER — Telehealth: Payer: Self-pay | Admitting: Radiation Therapy

## 2023-12-01 ENCOUNTER — Telehealth: Payer: Self-pay | Admitting: Nurse Practitioner

## 2023-12-01 NOTE — Telephone Encounter (Signed)
 [facility] to request a Radiation Oncology referral be sent for this patient to be seen in our clinic. A message was sent to the nurse and we are awaiting a call back with the status update of that request.   Devere Perch R.T(R)(T) Radiation Special Procedures Lead

## 2023-12-01 NOTE — Telephone Encounter (Signed)
 I spoke with patient's spouse to schedule New Patient Palliative appointment. Patient aware of date/time.

## 2023-12-02 ENCOUNTER — Telehealth: Payer: Self-pay | Admitting: *Deleted

## 2023-12-02 NOTE — Telephone Encounter (Signed)
 PC to patient's wife, [name] - informed her of Dr Eward recommendation below.  She verbalizes understanding.

## 2023-12-02 NOTE — Telephone Encounter (Signed)
-----   Message from Arthea MARLA Manns sent at 12/02/2023 12:13 PM EDT ----- No concern with Gemtesa.  I was hesitant to add any additional sedating medications because he was sleeping a lot during the daytime.  We discussed this in clinic.  We could consider increasing the Trazodone to 100mg  at night, if interested.  Zachary K Vaslow, MD ----- Message ----- From: Marget Rudell ORN, RN Sent: 12/01/2023   2:44 PM EDT To: Zachary K Vaslow, MD  Mr Plaskett's wife [name] called.  She wanted to verify his doses of lamictal until he stops the keppra  so we reviewed those instructions.  He had a urology appointment today, he was prescribed Gemtesa but she wants your approval for him to be on this medication before they start it.  [name] is also asking if he can have something different for sleep, the trazadone is not helping very much.

## 2023-12-06 ENCOUNTER — Encounter: Payer: Self-pay | Admitting: *Deleted

## 2023-12-06 ENCOUNTER — Inpatient Hospital Stay: Attending: Neurosurgery

## 2023-12-06 NOTE — Progress Notes (Signed)
 [facility] Provider-Medical Request for Service (for radiation oncology) and last MD progress note from 11/29/23 signed by Dr [name] & emailed to [email or web address]

## 2023-12-08 ENCOUNTER — Telehealth: Payer: Self-pay | Admitting: Radiation Oncology

## 2023-12-08 NOTE — Telephone Encounter (Signed)
 11/5 @ 11:02 am Left voicemail for patient to call our office to be sch for consult.

## 2023-12-09 ENCOUNTER — Other Ambulatory Visit (HOSPITAL_COMMUNITY)

## 2023-12-10 NOTE — Progress Notes (Signed)
 Location/Histology of Brain Tumor:  Left Frontotemporal Brain Tumor  Patient presented with symptoms of:   10/21/2023 [name], MD Slurred speech and right sided weakness and R sided facial droop. He also had a seizure episode described as staring head turned to left followed by 1-2 days of confusion.   09/15/2023 MRI Brain W and Wo Contrast    Past or anticipated interventions, if any, per neurosurgery:   Past or anticipated interventions, if any, per medical oncology:   Dose of Decadron , if applicable: Decadron  4mg  Daily  Recent neurologic symptoms, if any:  Seizures: Seizure like episodes while standing up and sitting. Has staring episodes Headaches: None Nausea: None Dizziness/ataxia: Yes, while standing up Difficulty with hand coordination:  Focal numbness/weakness: Right hand is numb and hard to grip certain items. Grabs his left hand Visual deficits/changes: Had cataract surgery, no vision Confusion/Memory deficits: Yes, having transient confusion and gets worse at night.  Painful bone metastases at present, if any: N/A  SAFETY ISSUES: Prior radiation? None Pacemaker/ICD? None.  Has a deep brain stimulator  Possible current pregnancy? None Is the patient on methotrexate? None  Additional Complaints / other details: None

## 2023-12-11 ENCOUNTER — Other Ambulatory Visit: Payer: Self-pay | Admitting: Internal Medicine

## 2023-12-11 ENCOUNTER — Other Ambulatory Visit: Payer: Self-pay | Admitting: Oncology

## 2023-12-11 MED ORDER — TRAZODONE HCL 50 MG PO TABS
100.0000 mg | ORAL_TABLET | Freq: Every day | ORAL | 1 refills | Status: AC
Start: 2023-12-11 — End: ?

## 2023-12-13 ENCOUNTER — Inpatient Hospital Stay: Admitting: Internal Medicine

## 2023-12-13 ENCOUNTER — Telehealth: Payer: Self-pay | Admitting: *Deleted

## 2023-12-13 NOTE — Progress Notes (Signed)
 Radiation Oncology         [phone] ________________________________  Initial outpatient Consultation  Name: [name] [name] MRN: [id number]  Date: 12/14/2023  DOB: 02/11/1945  RR:Jjmnw, Louann ONEIDA, MD  [name] Arthea POUR, MD   REFERRING PHYSICIAN: [name] Arthea POUR, MD  DIAGNOSIS:  No diagnosis found.   HISTORY OF PRESENT ILLNESS::[name] [name] is a 78 y.o. male who presented to the ED on 09/15/23 complaining of right side weakness. Patient's medical history is significant for Parkinson's disease, for which he had a deep brain stimulator placed in 2014.     During his inpatient care, a brain MRI on 09/15/23 showed a large ill-defined region of T2/FLAIR hyperintensity involving the left frontal lobe, high left parietal lobe, left corona radiata, posterior superior left temporal lobe, and partially the left basal ganglia, with irregular peripheral enhancement and mild local mass effect. No midline shift. 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 Scan also noted additional mild T2/FLAIR signal abnormality within the right centrum semiovale with irregular enhancement.  Subsequently, he was referred to Dr. [name] on 09/28/23 with complains of slurred speech and right side weakness along with episodes of seizures and confusion. Upon discussion, he opted to proceed with a minimally invasive stereotactic biopsy which he underwent on 10/11/23 under the care of Dr. Smitty. 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 Report was non-diagnostic but did not rule out false negative CNS lymphoma from steroid effect.      Brain MRI on 10/20/23 showed greatly decreased enhancement and mildly improved T2 signal abnormality of the bihemispheric abnormality, potentially related to steroid therapy. PET scan performed on 10/28/23 showed metabolic activity associated with the bihemispheric findings on comparison MRI. Scan  also noted metabolic activity associated with airspace disease in the right upper lobe and peribronchial thickening in left lower lobe is favored infectious or inflammatory.   MRI of the brain on 11/25/23 demonstrated widespread bihemispheric T2 signal abnormality and patchy enhancement, worsened from 10/20/2023 though remaining improved from 09/15/2023.  During is most recent visit with Dr. [name] on 11/29/23, patient reported overall decline in neurological function including inability to walk due to poor balance, e visual hallucinations, and irritability. Patient was switched from Keppra  500mg  twice per day to Lamictal given hallucinations and irritability. Decadron  was increased from 4 mg to 8 mg  in the morning.     PREVIOUS RADIATION THERAPY: {EXAM; YES/NO:19492::No}  PAST MEDICAL HISTORY:  has a past medical history of Agent orange exposure, Anemia, BPH (benign prostatic hypertrophy), Cancer (HCC), Complication of anesthesia, Constipation, Depression, Fatigue, GERD (gastroesophageal reflux disease), History of kidney stones, Left ureteral calculus, Lower urinary tract symptoms (LUTS), Nephrolithiasis, Neuromuscular disorder (HCC), Orthostatic hypotension, Orthostatic hypotension, Parkinson's disease (HCC), Peyronie disease, Pneumonia (2018), Renal cyst, left, S/P deep brain stimulator placement, Seizure (HCC), Sleep behavior disorder, REM, Wears glasses, and Wears hearing aid.    PAST SURGICAL HISTORY: Past Surgical History:  Procedure Laterality Date   APPLICATION OF CRANIAL NAVIGATION Left 10/11/2023   Procedure: COMPUTER-ASSISTED NAVIGATION, FOR CRANIAL PROCEDURE;  Surgeon: Lanis Pupa, MD;  Location: [facility];  Service: Neurosurgery;  Laterality: Left;   CATARACT EXTRACTION Bilateral    CHOLECYSTECTOMY  12/21/2014   Procedure: LAPAROSCOPIC CHOLECYSTECTOMY;  Surgeon: Lynda Leos, MD;  Location: [facility] OR;  Service: General;;   COLONOSCOPY     CYSTO/  LEFT URETEROSOCPY/   LEFT  URETERAL STENT PLACEMENT/  LASER BLADDER STONES AND EXTRACTION  09/25/2010   CYSTOSCOPY  WITH INSERTION OF UROLIFT N/A 03/11/2017   Procedure: CYSTOSCOPY WITH INSERTION OF UROLIFT, LITHOPAXY,STONE OBTAINED;  Surgeon: Matilda Senior, MD;  Location: [facility];  Service: Urology;  Laterality: N/A;   CYSTOSCOPY WITH LITHOLAPAXY N/A 09/12/2020   Procedure: CYSTOSCOPY WITH LITHOLAPAXY, LEFT RETROGRADE PYELOGRAMS, LEFT URETEROSCOPY, AND STENT PLACEMENT;  Surgeon: Cam Morene ORN, MD;  Location: [facility];  Service: Urology;  Laterality: N/A;  REQUESTING 2 HRS   CYSTOSCOPY WITH LITHOLAPAXY Right 07/14/2023   Procedure: CYSTOSCOPY, WITH BLADDER CALCULUS LITHOLAPAXY;  Surgeon: Cam Morene ORN, MD;  Location: [facility];  Service: Urology;  Laterality: Right;  CYSTOLITHOLAPAXY, RIGHT URETEROSCOPY, LASER LITHOTRIPSY, STONE EXTRACTION, RIGHT URETERAL STENT PLACEMENT   CYSTOSCOPY WITH RETROGRADE PYELOGRAM, URETEROSCOPY AND STENT PLACEMENT Left 01/06/2013   Procedure: CYSTOSCOPY WITH RETROGRADE PYELOGRAM, URETEROSCOPY AND STENT PLACEMENT;  Surgeon: Thomasine Oiler, MD;  Location: [facility];  Service: Urology;  Laterality: Left;   CYSTOSCOPY WITH RETROGRADE PYELOGRAM, URETEROSCOPY AND STENT PLACEMENT Left 09/11/2014   Procedure: CYSTOSCOPY WITH LEFT RETROGRADE PYELOGRAM, URETEROSCOPY AND STENT PLACEMENT;  Surgeon: Oliva Oiler, MD;  Location: [facility];  Service: Urology;  Laterality: Left;   CYSTOSCOPY/RETROGRADE/URETEROSCOPY/STONE EXTRACTION WITH BASKET Right 07/14/2023   Procedure: CYSTOSCOPY, WITH CALCULUS REMOVAL USING BASKET;  Surgeon: Cam Morene ORN, MD;  Location: [facility];  Service: Urology;  Laterality: Right;   CYSTOSCOPY/URETEROSCOPY/HOLMIUM LASER/STENT PLACEMENT Right 07/14/2023   Procedure: CYSTOSCOPY/URETEROSCOPY/HOLMIUM LASER/STENT PLACEMENT;  Surgeon: Cam Morene ORN, MD;  Location: [facility];  Service: Urology;  Laterality:  Right;   DEEP BRAIN STIMULATOR PLACEMENT  11/29/2012   genertor device at left pectoral area-   EXTRACORPOREAL SHOCK WAVE LITHOTRIPSY Left 10-11-2010  &  01-05-2011   HOLMIUM LASER APPLICATION Left 01/06/2013   Procedure: HOLMIUM LASER APPLICATION;  Surgeon: Thomasine Oiler, MD;  Location: [facility];  Service: Urology;  Laterality: Left;   HOLMIUM LASER APPLICATION Left 09/11/2014   Procedure: HOLMIUM LASER APPLICATION;  Surgeon: Oliva Oiler, MD;  Location: [facility];  Service: Urology;  Laterality: Left;   HOLMIUM LASER APPLICATION N/A 03/11/2017   Procedure: HOLMIUM LASER APPLICATION;  Surgeon: Matilda Senior, MD;  Location: [facility];  Service: Urology;  Laterality: N/A;   NEGATIVE SLEEP STUDY  2013  per pt   NEPHROLITHOTOMY Right 08/13/2022   Procedure: RIGHT NEPHROLITHOTOMY PERCUTANEOUS;  Surgeon: Cam Morene ORN, MD;  Location: [facility];  Service: Urology;  Laterality: Right;  210 MINUTES   PR DURAL GRAFT SPINAL Left 10/11/2023   Procedure: FRAMELESS STEREOTACTIC BIOPSY;  Surgeon: Lanis Pupa, MD;  Location: [facility] OR;  Service: Neurosurgery;  Laterality: Left;  STEREOTACTIC LEFT BRAIN BX   SP PERC NEPHROSTOMY Left 12/16/2012   STONE EXTRACTION WITH BASKET Left 01/06/2013   Procedure: STONE EXTRACTION WITH BASKET;  Surgeon: Thomasine Oiler, MD;  Location: [facility];  Service: Urology;  Laterality: Left;   TONSILLECTOMY     TRANSURETHRAL RESECTION OF PROSTATE N/A 09/12/2020   Procedure: TRANSURETHRAL RESECTION OF THE PROSTATE (TURP);  Surgeon: Cam Morene ORN, MD;  Location: [facility];  Service: Urology;  Laterality: N/A;    FAMILY HISTORY: family history includes Arthritis/Rheumatoid in his sister; Cancer in his father; Dementia in his father; Heart attack in his sister; Hypertension in his mother.  SOCIAL HISTORY:  reports that he has never smoked. He has never used smokeless tobacco. He  reports that he does not currently use alcohol . He reports that he does not use drugs.  ALLERGIES: Alfuzosin ,  Rivastigmine tartrate, and Tamsulosin  MEDICATIONS:  Current Outpatient Medications  Medication Sig Dispense Refill   benzonatate  (TESSALON ) 100 MG capsule Take 1 capsule (100 mg total) by mouth every 8 (eight) hours. (Patient not taking: Reported on 11/29/2023) 21 capsule 0   carbidopa -levodopa  (SINEMET  CR) 50-200 MG tablet Take 1 tablet by mouth at bedtime.     carbidopa -levodopa  (SINEMET  IR) 25-100 MG tablet 2-2.5 tablets 4 times per day at 6am/10:30am/2pm/6pm (Patient taking differently: Take 2 tablets by mouth 4 (four) times daily. 6am/10:30am/2pm/6pm)     cyanocobalamin  1000 MCG tablet Take 1 tablet (1,000 mcg total) by mouth daily. (Patient taking differently: Take 1,000 mcg by mouth daily. 4 times a week) 30 tablet 0   dexamethasone  (DECADRON ) 4 MG tablet Take 1 tablet (4 mg total) by mouth daily. 60 tablet 1   fludrocortisone  (FLORINEF ) 0.1 MG tablet Take 2 tablets (0.2 mg total) by mouth 3 (three) times daily with meals. (Patient not taking: Reported on 11/29/2023) 180 tablet 0   fluticasone  (FLONASE ) 50 MCG/ACT nasal spray Place 2 sprays into both nostrils daily. (Patient not taking: Reported on 11/29/2023) 16 g 0   lamoTRIgine (LAMICTAL) 100 MG tablet Take 1 tablet (100 mg total) by mouth daily. 60 tablet 1   lamoTRIgine (LAMICTAL) 25 MG tablet Take 1 tablet (25 mg total) by mouth daily for 7 days, THEN 2 tablets (50 mg total) daily for 7 days. 21 tablet 0   levETIRAcetam  (KEPPRA ) 500 MG tablet TAKE 1 TABLET BY MOUTH TWICE A DAY 60 tablet 0   Levodopa  (INBRIJA ) 42 MG CAPS We are going to start inbrija .  Remember that TWO capsules is ONE dosage (never inhale just one capsule).  You can inhale the capsules as needed up to 5 times per day, separated by 2 hour intervals. (Patient not taking: Reported on 11/29/2023)     midodrine  (PROAMATINE ) 5 MG tablet Take 1 tablet (5 mg total) by  mouth 3 (three) times daily with meals. (Patient not taking: Reported on 11/29/2023) 270 tablet 1   pantoprazole  (PROTONIX ) 40 MG tablet TAKE 1 TABLET BY MOUTH EVERY DAY 90 tablet 1   traZODone (DESYREL) 50 MG tablet TAKE 1 TABLET BY MOUTH EVERYDAY AT BEDTIME 90 tablet 1   traZODone (DESYREL) 50 MG tablet Take 2 tablets (100 mg total) by mouth at bedtime. 60 tablet 1   No current facility-administered medications for this visit.    REVIEW OF SYSTEMS:  As above.   PHYSICAL EXAM:  vitals were not taken for this visit.   General: Alert and oriented, in no acute distress *** HEENT: Head is normocephalic. Extraocular movements are intact. Oropharynx is clear. Neck: Neck is supple, no palpable cervical or supraclavicular lymphadenopathy. Heart: Regular in rate and rhythm with no murmurs, rubs, or gallops. Chest: Clear to auscultation bilaterally, with no rhonchi, wheezes, or rales. Abdomen: Soft, nontender, nondistended, with no rigidity or guarding. Extremities: No cyanosis or edema. Lymphatics: see Neck Exam Skin: No concerning lesions. Musculoskeletal: symmetric strength and muscle tone throughout. Neurologic: Cranial nerves II through XII are grossly intact. No obvious focalities. Speech is fluent. Coordination is intact. Psychiatric: Judgment and insight are intact. Affect is appropriate.   LABORATORY DATA:  Lab Results  Component Value Date   WBC 14.6 (H) 09/25/2023   HGB 13.0 09/25/2023   HCT 37.5 (L) 09/25/2023   MCV 93.8 09/25/2023   PLT 370 09/25/2023   CMP     Component Value Date/Time   NA 134 (L) 09/25/2023 9472  K 4.6 09/25/2023 0527   CL 108 09/25/2023 0527   CO2 25 09/25/2023 0527   GLUCOSE 129 (H) 09/25/2023 0527   BUN 33 (H) 09/25/2023 0527   CREATININE 0.96 09/25/2023 0527   CALCIUM  8.3 (L) 09/25/2023 0527   PROT 5.6 (L) 09/23/2023 1714   ALBUMIN 2.9 (L) 09/23/2023 1714   AST 21 09/23/2023 1714   ALT 10 09/23/2023 1714   ALKPHOS 58 09/23/2023 1714    BILITOT 0.8 09/23/2023 1714   GFRNONAA >60 09/25/2023 0527         RADIOGRAPHY: MR BRAIN W WO CONTRAST Result Date: 11/25/2023 EXAM: MRI BRAIN WITH AND WITHOUT CONTRAST 11/25/2023 12:38:35 PM TECHNIQUE: Multiplanar multisequence MRI of the head/brain was performed with and without the administration of 7 mL of gadobutrol  (GADAVIST ) 1 MMOL/ML injection 7 mL GADOBUTROL  1 MMOL/ML IV SOLN. COMPARISON: Head MRI 10/20/2023 and 09/15/2023. CLINICAL HISTORY: Brain/CNS neoplasm, assess treatment response. FINDINGS: LIMITATIONS: The examination is mildly motion degraded. BRAIN AND VENTRICLES: Extensive, confluent T2 FLAIR hyperintensity is again seen in the left greater than right cerebral hemispheric white matter extending across the corpus callosum. This has progressed from 10/20/2023, most notably in the high frontoparietal regions and corpus callosum, however it remains improved from 09/15/2023. Likewise, numerous areas of patchy and nodular enhancement within the T2 signal abnormality have also progressed from 10/20/2023 but overall remain less than on 09/15/2023. Associated mild diffusion weighted signal abnormality remains. There is no midline shift or other significant mass effect. A small focus of encephalomalacia and chronic blood products are again noted in the posterior left frontal lobe at a biopsy site. No acute infarct, hydrocephalus, or extra axial fluid collection is evident. Deep brain stimulators remain in place. There is mild cerebral atrophy. Major intracranial vascular flow voids are preserved. ORBITS: Bilateral cataract extraction. SINUSES: Small bilateral mastoid effusions. Clear paranasal sinuses. BONES AND SOFT TISSUES: Normal bone marrow signal and enhancement. No acute soft tissue abnormality. IMPRESSION: 1. Widespread bihemispheric T2 signal abnormality and patchy enhancement, worsened from 10/20/2023 though remaining improved from 09/15/2023. Electronically signed by: Dasie Hamburg MD  11/25/2023 01:29 PM EDT RP Workstation: [id number]      IMPRESSION/PLAN: This is a very pleasant *** year old *** with metastatic disease to the brain.  I had a lengthy discussion with the patient after reviewing their MRI results with them.  We spoke about whole brain radiotherapy versus stereotactic radiosurgery to the brain. We spoke about the differing risks benefits and side effects of both of these treatments. During part of our discussion, we spoke about the hair loss, fatigue and cognitive effects that can result from whole brain radiotherapy.  Additionally, we spoke about radionecrosis that can result from stereotactic radiosurgery. I explained that whole brain radiotherapy is more comprehensive and therefore can decrease the chance of recurrences elsewhere in the brain, while stereotactic radiosurgery only treats the areas of gross disease while sparing the rest of the brain parenchyma.  After lengthy discussion, the patient would like to proceed with stereotactic brain radiosurgery to their metastatic disease. They will meet with neurosurgery in the near future to discuss this further; a neurosurgeon will participate in their case.  CT simulation will take place on *** and treatment on ***.  I plan to deliver *** Gy in 1 fraction to ***.  On date of service, in total, I spent *** minutes on this encounter. Patient was seen in person.   __________________________________________   Lauraine Golden, MD  This document serves as a record of  services personally performed by Lauraine Golden, MD. It was created on her behalf by Reymundo Cartwright, a trained medical scribe. The creation of this record is based on the scribe's personal observations and the provider's statements to them. This document has been checked and approved by the attending provider.

## 2023-12-13 NOTE — Telephone Encounter (Signed)
-----   Message from Arthea MARLA Manns sent at 12/13/2023  3:29 PM EST ----- That is more likely convulsive syncope rather than seizure based on the description.  Make sure he takes extra time when transitioning to standing position.   Con't current seizure med schedule, we can also discuss it further when I see him next. ----- Message ----- From: Marget Rudell ORN, RN Sent: 12/13/2023   1:59 PM EST To: Zachary K Vaslow, MD  This patient's wife called, he has two more days to take Lamictal 50 mg, then he will be taking the 100mg  & stopping the Keppra .  She said he had two seizures recently - one on Friday & one today - both times PT was getting him up, from lying to sitting.  [name] said he just blanked out & they noted a small bit of jerking.  Please let me know if you want any medications changed.

## 2023-12-13 NOTE — Telephone Encounter (Signed)
 PC to patient's wife, [name] - informed her of Dr Eward opinion & medication recommendation as below, she verbalizes understanding.

## 2023-12-14 ENCOUNTER — Ambulatory Visit
Admission: RE | Admit: 2023-12-14 | Discharge: 2023-12-14 | Disposition: A | Discharge status: Home / Self Care | Source: Ambulatory Visit | Attending: Radiation Oncology | Admitting: Radiation Oncology

## 2023-12-14 ENCOUNTER — Other Ambulatory Visit: Payer: Self-pay

## 2023-12-14 VITALS — BP 129/81 | HR 73 | Temp 97.8°F | Resp 20 | Ht 70.0 in

## 2023-12-14 DIAGNOSIS — Z7739 Contact with and (suspected) exposure to other war theater: Secondary | ICD-10-CM | POA: Diagnosis not present

## 2023-12-14 DIAGNOSIS — Z7952 Long term (current) use of systemic steroids: Secondary | ICD-10-CM | POA: Insufficient documentation

## 2023-12-14 DIAGNOSIS — Z809 Family history of malignant neoplasm, unspecified: Secondary | ICD-10-CM | POA: Insufficient documentation

## 2023-12-14 DIAGNOSIS — I951 Orthostatic hypotension: Secondary | ICD-10-CM | POA: Diagnosis not present

## 2023-12-14 DIAGNOSIS — C8339 Primary central nervous system lymphoma: Secondary | ICD-10-CM | POA: Insufficient documentation

## 2023-12-14 DIAGNOSIS — G20A1 Parkinson's disease without dyskinesia, without mention of fluctuations: Secondary | ICD-10-CM | POA: Insufficient documentation

## 2023-12-14 DIAGNOSIS — Z87442 Personal history of urinary calculi: Secondary | ICD-10-CM | POA: Diagnosis not present

## 2023-12-14 DIAGNOSIS — K219 Gastro-esophageal reflux disease without esophagitis: Secondary | ICD-10-CM | POA: Diagnosis not present

## 2023-12-14 DIAGNOSIS — D496 Neoplasm of unspecified behavior of brain: Secondary | ICD-10-CM

## 2023-12-14 DIAGNOSIS — Z79899 Other long term (current) drug therapy: Secondary | ICD-10-CM | POA: Insufficient documentation

## 2023-12-14 DIAGNOSIS — G939 Disorder of brain, unspecified: Secondary | ICD-10-CM | POA: Insufficient documentation

## 2023-12-14 DIAGNOSIS — Z510 Encounter for antineoplastic radiation therapy: Secondary | ICD-10-CM | POA: Insufficient documentation

## 2023-12-14 DIAGNOSIS — K5900 Constipation, unspecified: Secondary | ICD-10-CM | POA: Diagnosis not present

## 2023-12-14 DIAGNOSIS — C711 Malignant neoplasm of frontal lobe: Secondary | ICD-10-CM | POA: Insufficient documentation

## 2023-12-14 DIAGNOSIS — N400 Enlarged prostate without lower urinary tract symptoms: Secondary | ICD-10-CM | POA: Diagnosis not present

## 2023-12-14 DIAGNOSIS — R110 Nausea: Secondary | ICD-10-CM | POA: Diagnosis not present

## 2023-12-14 MED ORDER — ONDANSETRON 4 MG PO TBDP: 8.0000 mg | ORAL_TABLET | Freq: Once | ORAL | Status: DC

## 2023-12-14 MED ORDER — ONDANSETRON 8 MG PO TBDP
8.0000 mg | ORAL_TABLET | Freq: Three times a day (TID) | ORAL | 3 refills | Status: AC | PRN
Start: 2023-12-14 — End: ?

## 2023-12-14 MED ORDER — ONDANSETRON 4 MG PO TBDP
8.0000 mg | ORAL_TABLET | Freq: Once | ORAL | Status: AC
  Administered 2023-12-14: 8 mg via ORAL

## 2023-12-20 ENCOUNTER — Ambulatory Visit
Admission: RE | Admit: 2023-12-20 | Discharge: 2023-12-20 | Disposition: A | Discharge status: Home / Self Care | Source: Ambulatory Visit | Attending: Radiation Oncology | Admitting: Radiation Oncology

## 2023-12-20 ENCOUNTER — Other Ambulatory Visit: Payer: Self-pay

## 2023-12-20 ENCOUNTER — Inpatient Hospital Stay: Admitting: Nurse Practitioner

## 2023-12-20 LAB — RAD ONC ARIA SESSION SUMMARY
Course Elapsed Days: 0
Plan Fractions Treated to Date: 1
Plan Prescribed Dose Per Fraction: 2.5 Gy
Plan Total Fractions Prescribed: 13
Plan Total Prescribed Dose: 32.5 Gy
Reference Point Dosage Given to Date: 2.5 Gy
Reference Point Session Dosage Given: 2.5 Gy
Session Number: 1

## 2023-12-21 ENCOUNTER — Other Ambulatory Visit: Payer: Self-pay

## 2023-12-21 ENCOUNTER — Ambulatory Visit
Admission: RE | Admit: 2023-12-21 | Discharge: 2023-12-21 | Disposition: A | Source: Ambulatory Visit | Attending: Radiation Oncology

## 2023-12-21 LAB — RAD ONC ARIA SESSION SUMMARY
Course Elapsed Days: 1
Plan Fractions Treated to Date: 2
Plan Prescribed Dose Per Fraction: 2.5 Gy
Plan Total Fractions Prescribed: 13
Plan Total Prescribed Dose: 32.5 Gy
Reference Point Dosage Given to Date: 5 Gy
Reference Point Session Dosage Given: 2.5 Gy
Session Number: 2

## 2023-12-22 ENCOUNTER — Other Ambulatory Visit: Payer: Self-pay

## 2023-12-22 ENCOUNTER — Telehealth: Payer: Self-pay | Admitting: Internal Medicine

## 2023-12-22 ENCOUNTER — Ambulatory Visit
Admission: RE | Admit: 2023-12-22 | Discharge: 2023-12-22 | Disposition: A | Source: Ambulatory Visit | Attending: Radiation Oncology

## 2023-12-22 LAB — RAD ONC ARIA SESSION SUMMARY
Course Elapsed Days: 2
Plan Fractions Treated to Date: 3
Plan Prescribed Dose Per Fraction: 2.5 Gy
Plan Total Fractions Prescribed: 13
Plan Total Prescribed Dose: 32.5 Gy
Reference Point Dosage Given to Date: 7.5 Gy
Reference Point Session Dosage Given: 2.5 Gy
Session Number: 3

## 2023-12-22 NOTE — Telephone Encounter (Signed)
 Scheduled patient for next appointment. Called and left a voicemail with the details.

## 2023-12-23 ENCOUNTER — Ambulatory Visit
Admission: RE | Admit: 2023-12-23 | Discharge: 2023-12-23 | Disposition: A | Discharge status: Home / Self Care | Source: Ambulatory Visit | Attending: Radiation Oncology | Admitting: Radiation Oncology

## 2023-12-23 ENCOUNTER — Inpatient Hospital Stay

## 2023-12-23 ENCOUNTER — Other Ambulatory Visit: Payer: Self-pay

## 2023-12-23 LAB — RAD ONC ARIA SESSION SUMMARY
Course Elapsed Days: 3
Plan Fractions Treated to Date: 4
Plan Prescribed Dose Per Fraction: 2.5 Gy
Plan Total Fractions Prescribed: 13
Plan Total Prescribed Dose: 32.5 Gy
Reference Point Dosage Given to Date: 10 Gy
Reference Point Session Dosage Given: 2.5 Gy
Session Number: 4

## 2023-12-24 ENCOUNTER — Ambulatory Visit
Admission: RE | Admit: 2023-12-24 | Discharge: 2023-12-24 | Disposition: A | Discharge status: Home / Self Care | Source: Ambulatory Visit | Attending: Radiation Oncology | Admitting: Radiation Oncology

## 2023-12-24 ENCOUNTER — Other Ambulatory Visit: Payer: Self-pay

## 2023-12-24 LAB — RAD ONC ARIA SESSION SUMMARY
Course Elapsed Days: 4
Plan Fractions Treated to Date: 5
Plan Prescribed Dose Per Fraction: 2.5 Gy
Plan Total Fractions Prescribed: 13
Plan Total Prescribed Dose: 32.5 Gy
Reference Point Dosage Given to Date: 12.5 Gy
Reference Point Session Dosage Given: 2.5 Gy
Session Number: 5

## 2023-12-27 ENCOUNTER — Other Ambulatory Visit: Payer: Self-pay

## 2023-12-27 ENCOUNTER — Ambulatory Visit
Admission: RE | Admit: 2023-12-27 | Discharge: 2023-12-27 | Disposition: A | Source: Ambulatory Visit | Attending: Radiation Oncology

## 2023-12-27 ENCOUNTER — Telehealth: Payer: Self-pay

## 2023-12-27 ENCOUNTER — Ambulatory Visit
Admission: RE | Admit: 2023-12-27 | Discharge: 2023-12-27 | Disposition: A | Discharge status: Home / Self Care | Source: Ambulatory Visit | Attending: Radiation Oncology | Admitting: Radiation Oncology

## 2023-12-27 LAB — RAD ONC ARIA SESSION SUMMARY
Course Elapsed Days: 7
Plan Fractions Treated to Date: 6
Plan Prescribed Dose Per Fraction: 2.5 Gy
Plan Total Fractions Prescribed: 13
Plan Total Prescribed Dose: 32.5 Gy
Reference Point Dosage Given to Date: 15 Gy
Reference Point Session Dosage Given: 2.5 Gy
Session Number: 6

## 2023-12-27 NOTE — Telephone Encounter (Signed)
 I spoke with patient's spouse to schedule palliative care appointment. Patient is aware of date/time.

## 2023-12-28 ENCOUNTER — Ambulatory Visit
Admission: RE | Admit: 2023-12-28 | Discharge: 2023-12-28 | Disposition: A | Source: Ambulatory Visit | Attending: Radiation Oncology

## 2023-12-28 ENCOUNTER — Other Ambulatory Visit: Payer: Self-pay

## 2023-12-28 LAB — RAD ONC ARIA SESSION SUMMARY
Course Elapsed Days: 8
Plan Fractions Treated to Date: 7
Plan Prescribed Dose Per Fraction: 2.5 Gy
Plan Total Fractions Prescribed: 13
Plan Total Prescribed Dose: 32.5 Gy
Reference Point Dosage Given to Date: 17.5 Gy
Reference Point Session Dosage Given: 2.5 Gy
Session Number: 7

## 2023-12-29 ENCOUNTER — Other Ambulatory Visit: Payer: Self-pay

## 2023-12-29 ENCOUNTER — Other Ambulatory Visit: Payer: Self-pay | Admitting: Internal Medicine

## 2023-12-29 ENCOUNTER — Ambulatory Visit
Admission: RE | Admit: 2023-12-29 | Discharge: 2023-12-29 | Disposition: A | Discharge status: Home / Self Care | Source: Ambulatory Visit | Attending: Radiation Oncology | Admitting: Radiation Oncology

## 2023-12-29 DIAGNOSIS — C8339 Primary central nervous system lymphoma: Secondary | ICD-10-CM

## 2023-12-29 DIAGNOSIS — C711 Malignant neoplasm of frontal lobe: Secondary | ICD-10-CM

## 2023-12-29 LAB — RAD ONC ARIA SESSION SUMMARY
Course Elapsed Days: 9
Plan Fractions Treated to Date: 8
Plan Prescribed Dose Per Fraction: 2.5 Gy
Plan Total Fractions Prescribed: 13
Plan Total Prescribed Dose: 32.5 Gy
Reference Point Dosage Given to Date: 20 Gy
Reference Point Session Dosage Given: 2.5 Gy
Session Number: 8

## 2023-12-29 NOTE — Telephone Encounter (Signed)
 Dexamethasone  refill updated and filled based on last OV note from Dr. [name] (11/29/23).

## 2024-01-03 ENCOUNTER — Inpatient Hospital Stay: Attending: Neurosurgery

## 2024-01-03 ENCOUNTER — Ambulatory Visit
Admission: RE | Admit: 2024-01-03 | Discharge: 2024-01-03 | Disposition: A | Source: Ambulatory Visit | Attending: Radiation Oncology

## 2024-01-03 ENCOUNTER — Inpatient Hospital Stay: Admitting: Internal Medicine

## 2024-01-03 ENCOUNTER — Other Ambulatory Visit: Payer: Self-pay | Admitting: Oncology

## 2024-01-03 ENCOUNTER — Ambulatory Visit
Admission: RE | Admit: 2024-01-03 | Discharge: 2024-01-03 | Disposition: A | Discharge status: Home / Self Care | Source: Ambulatory Visit | Attending: Radiation Oncology | Admitting: Radiation Oncology

## 2024-01-03 ENCOUNTER — Other Ambulatory Visit: Payer: Self-pay

## 2024-01-03 VITALS — BP 98/68 | HR 80 | Temp 98.1°F | Resp 17 | Ht 70.0 in

## 2024-01-03 DIAGNOSIS — Z809 Family history of malignant neoplasm, unspecified: Secondary | ICD-10-CM | POA: Insufficient documentation

## 2024-01-03 DIAGNOSIS — C8339 Primary central nervous system lymphoma: Secondary | ICD-10-CM | POA: Insufficient documentation

## 2024-01-03 DIAGNOSIS — G939 Disorder of brain, unspecified: Secondary | ICD-10-CM | POA: Insufficient documentation

## 2024-01-03 DIAGNOSIS — C711 Malignant neoplasm of frontal lobe: Secondary | ICD-10-CM

## 2024-01-03 DIAGNOSIS — G20A1 Parkinson's disease without dyskinesia, without mention of fluctuations: Secondary | ICD-10-CM | POA: Diagnosis not present

## 2024-01-03 DIAGNOSIS — Z510 Encounter for antineoplastic radiation therapy: Secondary | ICD-10-CM | POA: Diagnosis present

## 2024-01-03 DIAGNOSIS — Z79899 Other long term (current) drug therapy: Secondary | ICD-10-CM | POA: Insufficient documentation

## 2024-01-03 LAB — RAD ONC ARIA SESSION SUMMARY
Course Elapsed Days: 14
Plan Fractions Treated to Date: 9
Plan Prescribed Dose Per Fraction: 2.5 Gy
Plan Total Fractions Prescribed: 13
Plan Total Prescribed Dose: 32.5 Gy
Reference Point Dosage Given to Date: 22.5 Gy
Reference Point Session Dosage Given: 2.5 Gy
Session Number: 9

## 2024-01-03 LAB — CMP (CANCER CENTER ONLY)
ALT: 10 U/L (ref 0–44)
AST: 22 U/L (ref 15–41)
Albumin: 3.2 g/dL — ABNORMAL LOW (ref 3.5–5.0)
Alkaline Phosphatase: 58 U/L (ref 38–126)
Anion gap: 11 (ref 5–15)
BUN: 30 mg/dL — ABNORMAL HIGH (ref 8–23)
CO2: 22 mmol/L (ref 22–32)
Calcium: 8.2 mg/dL — ABNORMAL LOW (ref 8.9–10.3)
Chloride: 102 mmol/L (ref 98–111)
Creatinine: 0.98 mg/dL (ref 0.61–1.24)
GFR, Estimated: >60 mL/min (ref >60)
Glucose, Bld: 199 mg/dL — ABNORMAL HIGH (ref 70–99)
Potassium: 4.2 mmol/L (ref 3.5–5.1)
Sodium: 135 mmol/L (ref 135–145)
Total Bilirubin: 0.6 mg/dL (ref 0.0–1.2)
Total Protein: 5.8 g/dL — ABNORMAL LOW (ref 6.5–8.1)

## 2024-01-03 LAB — CBC WITH DIFFERENTIAL (CANCER CENTER ONLY)
Abs Immature Granulocytes: 0.71 K/uL — ABNORMAL HIGH (ref 0.00–0.07)
Basophils Absolute: 0.1 K/uL (ref 0.0–0.1)
Basophils Relative: 1 %
Eosinophils Absolute: 0 K/uL (ref 0.0–0.5)
Eosinophils Relative: 0 %
HCT: 40.5 % (ref 39.0–52.0)
Hemoglobin: 13.8 g/dL (ref 13.0–17.0)
Immature Granulocytes: 6 %
Lymphocytes Relative: 3 %
Lymphs Abs: 0.4 K/uL — ABNORMAL LOW (ref 0.7–4.0)
MCH: 31.5 pg (ref 26.0–34.0)
MCHC: 34.1 g/dL (ref 30.0–36.0)
MCV: 92.5 fL (ref 80.0–100.0)
Monocytes Absolute: 0.3 K/uL (ref 0.1–1.0)
Monocytes Relative: 2 %
Neutro Abs: 10.4 K/uL — ABNORMAL HIGH (ref 1.7–7.7)
Neutrophils Relative %: 88 %
Platelet Count: 261 K/uL (ref 150–400)
RBC: 4.38 MIL/uL (ref 4.22–5.81)
RDW: 13 % (ref 11.5–15.5)
Smear Review: NORMAL
WBC Count: 11.9 K/uL — ABNORMAL HIGH (ref 4.0–10.5)
nRBC: 0 % (ref 0.0–0.2)

## 2024-01-03 MED ORDER — LAMOTRIGINE 100 MG PO TABS: 100.0000 mg | ORAL_TABLET | Freq: Two times a day (BID) | ORAL | 2 refills | Status: AC

## 2024-01-03 MED ORDER — DEXAMETHASONE 4 MG PO TABS: 4.0000 mg | ORAL_TABLET | Freq: Every day | ORAL | Status: DC

## 2024-01-03 NOTE — Progress Notes (Signed)
 [facility] at [facility] [address]  [place], [place] [address] [phone]   Interval Evaluation  Date of Service: 01/03/24 Patient Name: [name] [name] Patient MRN: [id number] Patient DOB: Aug 27, 1945 Provider: Arthea MARLA Manns, MD  Identifying Statement:  [name] [name] is a 78 y.o. male with left frontal primary CNS lymphoma  Oncologic History: 10/11/23: Stereotactic biopsy with Dr. Lanis; path is indeterminate but suggestive of lymphoma  01/07/24: Completes 3 week course WBRT with Dr. Izell  Biomarkers:  MGMT Unknown.  IDH 1/2 Unknown.  EGFR Unknown  TERT Unknown   Interval History: [name] presents today for follow up, now in final week of radiation therapy.  He and his family describe some improvement initially during first week of radiation, but that has given way to increased fatigue and lethargy in recent days.  He is again no longer using the walker to walk due to poor balance, only ambulating with a wheelchair.  They do describe a focal seizure on Thanksgiving, he has been dosing Lamictal  100mg  daily.  Family has reported modest improvement in visual hallucinations and irritability since stopping the Keppra , however.  Sleep volume has increased overall but is irregular at night time.  Right side remains somewhat weak.  No frank seizures, headaches.  Continues on decadron  8mg  in the morning.  No other medication changes.  H+P (09/28/23) Patient presented to neurologic attention this month with several days history of slurred speech and right sided weakness.  He also had seizure episode described as staring head turned to left, followed by 1-2 days of confusion.  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  Was started on decadron  4mg  twice per day, which has ultimately not led to improvement in his symptoms.  He has baseline mobility issues from Parkinson's disease, had a deep brain stimulator placed in 2014.   Keppra  remains at 500mg  twice per day without issue.  Medications: Current Outpatient Medications on File Prior to Visit  Medication Sig Dispense Refill   carbidopa -levodopa  (SINEMET  CR) 50-200 MG tablet Take 1 tablet by mouth at bedtime.     carbidopa -levodopa  (SINEMET  IR) 25-100 MG tablet 2-2.5 tablets 4 times per day at 6am/10:30am/2pm/6pm     cyanocobalamin  1000 MCG tablet Take 1 tablet (1,000 mcg total) by mouth daily. 30 tablet 0   dexamethasone  (DECADRON ) 4 MG tablet Take 2 tablets (8 mg total) by mouth daily. 60 tablet 1   lamoTRIgine  (LAMICTAL ) 100 MG tablet Take 1 tablet (100 mg total) by mouth daily. 60 tablet 1   pantoprazole  (PROTONIX ) 40 MG tablet TAKE 1 TABLET BY MOUTH EVERY DAY 90 tablet 1   traZODone  (DESYREL ) 50 MG tablet Take 2 tablets (100 mg total) by mouth at bedtime. 60 tablet 1   fluticasone  (FLONASE ) 50 MCG/ACT nasal spray Place 2 sprays into both nostrils daily. (Patient not taking: Reported on 12/14/2023) 16 g 0   lamoTRIgine  (LAMICTAL ) 25 MG tablet Take 1 tablet (25 mg total) by mouth daily for 7 days, THEN 2 tablets (50 mg total) daily for 7 days. (Patient not taking: No sig reported) 21 tablet 0   midodrine  (PROAMATINE ) 5 MG tablet Take 1 tablet (5 mg total) by mouth 3 (three) times daily with meals. (Patient not taking: Reported on 01/03/2024) 270 tablet 1   ondansetron  (ZOFRAN -ODT) 8 MG disintegrating tablet Take 1 tablet (8 mg total) by mouth every 8 (eight) hours as needed for nausea or vomiting. (Patient not taking: Reported on 01/03/2024)  20 tablet 3   traZODone  (DESYREL ) 50 MG tablet TAKE 1 TABLET BY MOUTH EVERYDAY AT BEDTIME (Patient not taking: Reported on 12/14/2023) 90 tablet 1   No current facility-administered medications on file prior to visit.    Allergies:  Allergies  Allergen Reactions   Alfuzosin  Other (See Comments)    Hypotension    Rivastigmine Tartrate Nausea And Vomiting     Can tolerate the patch but can not tolerate the pills    Tamsulosin Other (See Comments)    Severe joint pain   Past Medical History:  Past Medical History:  Diagnosis Date   Agent orange exposure    Anemia    BPH (benign prostatic hypertrophy)    Cancer (HCC)    basal cell temple   Complication of anesthesia    confusion and combative when waking up from last kidney procedure   Constipation    Depression    Fatigue    d/t Parkinson's   GERD (gastroesophageal reflux disease)    occasional   History of kidney stones    Left ureteral calculus    Lower urinary tract symptoms (LUTS)    Nephrolithiasis    Neuromuscular disorder (HCC)     parkinson's   Orthostatic hypotension    d/t Parkinson's disease   Orthostatic hypotension    Parkinson's disease (HCC)    Peyronie disease    Pneumonia 2018   Renal cyst, left    S/P deep brain stimulator placement    11-29-2012   Seizure (HCC)    pt and wife report that he has had seizures and is now on seizure medicine   Sleep behavior disorder, REM    Wears glasses    Wears hearing aid    BILATERAL-- INTERMITTANT WEARS   Past Surgical History:  Past Surgical History:  Procedure Laterality Date   APPLICATION OF CRANIAL NAVIGATION Left 10/11/2023   Procedure: COMPUTER-ASSISTED NAVIGATION, FOR CRANIAL PROCEDURE;  Surgeon: Lanis Pupa, MD;  Location: [facility];  Service: Neurosurgery;  Laterality: Left;   CATARACT EXTRACTION Bilateral    CHOLECYSTECTOMY  12/21/2014   Procedure: LAPAROSCOPIC CHOLECYSTECTOMY;  Surgeon: Lynda Leos, MD;  Location: [facility] OR;  Service: General;;   COLONOSCOPY     CYSTO/  LEFT URETEROSOCPY/   LEFT URETERAL STENT PLACEMENT/  LASER BLADDER STONES AND EXTRACTION  09/25/2010   CYSTOSCOPY WITH INSERTION OF UROLIFT N/A 03/11/2017   Procedure: CYSTOSCOPY WITH INSERTION OF UROLIFT, LITHOPAXY,STONE OBTAINED;  Surgeon: Matilda Senior, MD;  Location: [facility];  Service: Urology;  Laterality: N/A;   CYSTOSCOPY WITH LITHOLAPAXY N/A 09/12/2020    Procedure: CYSTOSCOPY WITH LITHOLAPAXY, LEFT RETROGRADE PYELOGRAMS, LEFT URETEROSCOPY, AND STENT PLACEMENT;  Surgeon: Cam Morene ORN, MD;  Location: [facility];  Service: Urology;  Laterality: N/A;  REQUESTING 2 HRS   CYSTOSCOPY WITH LITHOLAPAXY Right 07/14/2023   Procedure: CYSTOSCOPY, WITH BLADDER CALCULUS LITHOLAPAXY;  Surgeon: Cam Morene ORN, MD;  Location: [facility];  Service: Urology;  Laterality: Right;  CYSTOLITHOLAPAXY, RIGHT URETEROSCOPY, LASER LITHOTRIPSY, STONE EXTRACTION, RIGHT URETERAL STENT PLACEMENT   CYSTOSCOPY WITH RETROGRADE PYELOGRAM, URETEROSCOPY AND STENT PLACEMENT Left 01/06/2013   Procedure: CYSTOSCOPY WITH RETROGRADE PYELOGRAM, URETEROSCOPY AND STENT PLACEMENT;  Surgeon: Thomasine Oiler, MD;  Location: [facility];  Service: Urology;  Laterality: Left;   CYSTOSCOPY WITH RETROGRADE PYELOGRAM, URETEROSCOPY AND STENT PLACEMENT Left 09/11/2014   Procedure: CYSTOSCOPY WITH LEFT RETROGRADE PYELOGRAM, URETEROSCOPY AND STENT PLACEMENT;  Surgeon: Oliva Oiler, MD;  Location: [facility];  Service: Urology;  Laterality: Left;   CYSTOSCOPY/RETROGRADE/URETEROSCOPY/STONE EXTRACTION WITH BASKET Right 07/14/2023   Procedure: CYSTOSCOPY, WITH CALCULUS REMOVAL USING BASKET;  Surgeon: Cam Morene ORN, MD;  Location: [facility];  Service: Urology;  Laterality: Right;   CYSTOSCOPY/URETEROSCOPY/HOLMIUM LASER/STENT PLACEMENT Right 07/14/2023   Procedure: CYSTOSCOPY/URETEROSCOPY/HOLMIUM LASER/STENT PLACEMENT;  Surgeon: Cam Morene ORN, MD;  Location: [facility];  Service: Urology;  Laterality: Right;   DEEP BRAIN STIMULATOR PLACEMENT  11/29/2012   genertor device at left pectoral area-   EXTRACORPOREAL SHOCK WAVE LITHOTRIPSY Left 10-11-2010  &  01-05-2011   HOLMIUM LASER APPLICATION Left 01/06/2013   Procedure: HOLMIUM LASER APPLICATION;  Surgeon: Thomasine Oiler, MD;  Location: [facility];  Service: Urology;  Laterality: Left;    HOLMIUM LASER APPLICATION Left 09/11/2014   Procedure: HOLMIUM LASER APPLICATION;  Surgeon: Oliva Oiler, MD;  Location: [facility];  Service: Urology;  Laterality: Left;   HOLMIUM LASER APPLICATION N/A 03/11/2017   Procedure: HOLMIUM LASER APPLICATION;  Surgeon: Matilda Senior, MD;  Location: [facility];  Service: Urology;  Laterality: N/A;   NEGATIVE SLEEP STUDY  2013  per pt   NEPHROLITHOTOMY Right 08/13/2022   Procedure: RIGHT NEPHROLITHOTOMY PERCUTANEOUS;  Surgeon: Cam Morene ORN, MD;  Location: [facility];  Service: Urology;  Laterality: Right;  210 MINUTES   PR DURAL GRAFT SPINAL Left 10/11/2023   Procedure: FRAMELESS STEREOTACTIC BIOPSY;  Surgeon: Lanis Pupa, MD;  Location: [facility];  Service: Neurosurgery;  Laterality: Left;  STEREOTACTIC LEFT BRAIN BX   SP PERC NEPHROSTOMY Left 12/16/2012   STONE EXTRACTION WITH BASKET Left 01/06/2013   Procedure: STONE EXTRACTION WITH BASKET;  Surgeon: Thomasine Oiler, MD;  Location: [facility];  Service: Urology;  Laterality: Left;   TONSILLECTOMY     TRANSURETHRAL RESECTION OF PROSTATE N/A 09/12/2020   Procedure: TRANSURETHRAL RESECTION OF THE PROSTATE (TURP);  Surgeon: Cam Morene ORN, MD;  Location: [facility];  Service: Urology;  Laterality: N/A;   Social History:  Social History   Socioeconomic History   Marital status: Married    Spouse name: Not on file   Number of children: Not on file   Years of education: Not on file   Highest education level: Not on file  Occupational History   Not on file  Tobacco Use   Smoking status: Never   Smokeless tobacco: Never  Vaping Use   Vaping status: Never Used  Substance and Sexual Activity   Alcohol  use: Not Currently    Comment: rarely beer   Drug use: No   Sexual activity: Not Currently  Other Topics Concern   Not on file  Social History Narrative   Not on file   Social Drivers of Health   Financial Resource  Strain: Not on file  Food Insecurity: No Food Insecurity (10/11/2023)   Hunger Vital Sign    Worried About Running Out of Food in the Last Year: Never true    Ran Out of Food in the Last Year: Never true  Transportation Needs: No Transportation Needs (10/11/2023)   PRAPARE - Administrator, Civil Service (Medical): No    Lack of Transportation (Non-Medical): No  Physical Activity: Not on file  Stress: Not on file  Social Connections: Moderately Isolated (10/11/2023)   Social Connection and Isolation Panel    Frequency of Communication with Friends and Family: Three times a week    Frequency of Social Gatherings with Friends and Family: Three times a week    Attends  Religious Services: Never    Active Member of Clubs or Organizations: No    Attends Banker Meetings: Never    Marital Status: Married  Catering Manager Violence: Not At Risk (10/11/2023)   Humiliation, Afraid, Rape, and Kick questionnaire    Fear of Current or Ex-Partner: No    Emotionally Abused: No    Physically Abused: No    Sexually Abused: No   Family History:  Family History  Problem Relation Age of Onset   Hypertension Mother    Dementia Father    Cancer Father    Heart attack Sister    Arthritis/Rheumatoid Sister     Review of Systems: Constitutional: Doesn't report fevers, chills or abnormal weight loss Eyes: Doesn't report blurriness of vision Ears, nose, mouth, throat, and face: Doesn't report sore throat Respiratory: Doesn't report cough, dyspnea or wheezes Cardiovascular: Doesn't report palpitation, chest discomfort  Gastrointestinal:  Doesn't report nausea, constipation, diarrhea GU: Doesn't report incontinence Skin: Doesn't report skin rashes Neurological: Per HPI Musculoskeletal: Doesn't report joint pain Behavioral/Psych: Doesn't report anxiety  Physical Exam: Vitals:   01/03/24 1452  BP: 98/68  Pulse: 80  Resp: 17  Temp: 98.1 F (36.7 C)  SpO2: 99%    KPS:  60. General: Alert, cooperative, pleasant, in no acute distress Head: Normal EENT: No conjunctival injection or scleral icterus.  Lungs: Resp effort normal Cardiac: Regular rate Abdomen: Non-distended abdomen Skin: No rashes cyanosis or petechiae. Extremities: No clubbing or edema  Neurologic Exam: Mental Status: Awake, alert, attentive to examiner. Oriented to self and environment. Language is fluent with intact comprehension.  Pyschomotor slowing. Hypophonic Cranial Nerves: Visual acuity is grossly normal. Visual fields are full. Extra-ocular movements intact. No ptosis. Face is symmetric Motor: Parkinsonian tremor with cogwheeling. Power is 4/5 in right arm and leg. Reflexes are symmetric, no pathologic reflexes present.  Sensory: Intact to light touch Gait: Non ambulatory   Labs: I have reviewed the data as listed    Component Value Date/Time   NA 135 01/03/2024 1422   K 4.2 01/03/2024 1422   CL 102 01/03/2024 1422   CO2 22 01/03/2024 1422   GLUCOSE 199 (H) 01/03/2024 1422   BUN 30 (H) 01/03/2024 1422   CREATININE 0.98 01/03/2024 1422   CALCIUM  8.2 (L) 01/03/2024 1422   PROT 5.8 (L) 01/03/2024 1422   ALBUMIN 3.2 (L) 01/03/2024 1422   AST 22 01/03/2024 1422   ALT 10 01/03/2024 1422   ALKPHOS 58 01/03/2024 1422   BILITOT 0.6 01/03/2024 1422   GFRNONAA >60 01/03/2024 1422   GFRAA >60 12/12/2014 1056   Lab Results  Component Value Date   WBC 11.9 (H) 01/03/2024   NEUTROABS 10.4 (H) 01/03/2024   HGB 13.8 01/03/2024   HCT 40.5 01/03/2024   MCV 92.5 01/03/2024   PLT 261 01/03/2024    Pathology:  This biopsy was reviewed at [facility] by Dr. Duwaine who following a consult rendered a diagnosis of atypical B-cell infiltrate; however, in his summary he states the findings are nonspecific.  He describes a diffuse lymphohistiocytic infiltrate involving gray-white matter with features as described above.  Immunohistochemical stains performed at [facility] reveals  scattered CD20 PAX5 positive B cells, a few of which appeared atypical.  The cells were associated with an increased proliferation index.  The adjacent glial cells show mild nuclear atypia compatible with reactive changes.  Multiple stains for infectious etiologies including toxoplasma, syphilis, Whipple disease, adenovirus, HSV 1/2 and CMV in addition to traditional Gram,  GMS and acid-fast bacilli stains were performed and were all negative. As noted, he said the findings are nonspecific, but the presence of atypical B cells is suspicious for a possible lymphoproliferative disorder that is masked by corticosteroid therapy.  Otherwise, this is a destructive macrophage rich inflammatory lesion that could be due to an infectious, autoimmune/inflammatory or paraneoplastic process.  Also, he could not exclude these changes are adjacent to an unsampled primary neoplastic process.  He recommends a liquid biopsy or resampling post steroid therapy with microbiologic testing. A summary of the consultative report is conveyed to Ds. Nundkumar and Lemon Whitacre by Dr. Reed via [employer] on 11/03/2023.    Assessment/Plan Primary CNS lymphoma (HCC)  ISSAM CARLYON is clinically stable today, now in final week of whole brain radiation.  He is doing well overall with treatment, with expected fatigue burden given RT dose.  Breakthrough seizure was apparently unprovoked in nature.  Recommended increasing Lamictal  to 100mg  BID.  Small lesion on his arm is not a drug rash.  Decadron  should be decreased to 6mg  daily x5 days, then 4mg  daily thereafter if tolerated.  He will con't to follow with radiation onocology and palliative care as well.  We ask that Jolee ONEIDA Pesa return to clinic in 1 month with post treatment brain MRI, or sooner as needed.  All questions were answered. The patient knows to call the clinic with any problems, questions or concerns. No barriers to learning were detected.  The  total time spent in the encounter was 40 minutes and more than 50% was on counseling and review of test results   Arthea MARLA Manns, MD Medical Director of Neuro-Oncology [facility] at [facility] 01/03/24 3:20 PM

## 2024-01-04 ENCOUNTER — Ambulatory Visit
Admission: RE | Admit: 2024-01-04 | Discharge: 2024-01-04 | Disposition: A | Source: Ambulatory Visit | Attending: Radiation Oncology

## 2024-01-04 ENCOUNTER — Other Ambulatory Visit: Payer: Self-pay

## 2024-01-04 DIAGNOSIS — Z510 Encounter for antineoplastic radiation therapy: Secondary | ICD-10-CM | POA: Diagnosis not present

## 2024-01-04 LAB — RAD ONC ARIA SESSION SUMMARY
Course Elapsed Days: 15
Plan Fractions Treated to Date: 10
Plan Prescribed Dose Per Fraction: 2.5 Gy
Plan Total Fractions Prescribed: 13
Plan Total Prescribed Dose: 32.5 Gy
Reference Point Dosage Given to Date: 25 Gy
Reference Point Session Dosage Given: 2.5 Gy
Session Number: 10

## 2024-01-05 ENCOUNTER — Other Ambulatory Visit: Payer: Self-pay

## 2024-01-05 ENCOUNTER — Ambulatory Visit
Admission: RE | Admit: 2024-01-05 | Discharge: 2024-01-05 | Disposition: A | Source: Ambulatory Visit | Attending: Radiation Oncology

## 2024-01-05 DIAGNOSIS — Z510 Encounter for antineoplastic radiation therapy: Secondary | ICD-10-CM | POA: Diagnosis not present

## 2024-01-05 LAB — RAD ONC ARIA SESSION SUMMARY
Course Elapsed Days: 16
Plan Fractions Treated to Date: 11
Plan Prescribed Dose Per Fraction: 2.5 Gy
Plan Total Fractions Prescribed: 13
Plan Total Prescribed Dose: 32.5 Gy
Reference Point Dosage Given to Date: 27.5 Gy
Reference Point Session Dosage Given: 2.5 Gy
Session Number: 11

## 2024-01-06 ENCOUNTER — Ambulatory Visit
Admission: RE | Admit: 2024-01-06 | Discharge: 2024-01-06 | Disposition: A | Source: Ambulatory Visit | Attending: Radiation Oncology

## 2024-01-06 ENCOUNTER — Other Ambulatory Visit: Payer: Self-pay

## 2024-01-06 DIAGNOSIS — Z510 Encounter for antineoplastic radiation therapy: Secondary | ICD-10-CM | POA: Diagnosis not present

## 2024-01-06 LAB — RAD ONC ARIA SESSION SUMMARY
Course Elapsed Days: 17
Plan Fractions Treated to Date: 12
Plan Prescribed Dose Per Fraction: 2.5 Gy
Plan Total Fractions Prescribed: 13
Plan Total Prescribed Dose: 32.5 Gy
Reference Point Dosage Given to Date: 30 Gy
Reference Point Session Dosage Given: 2.5 Gy
Session Number: 12

## 2024-01-07 ENCOUNTER — Ambulatory Visit
Admission: RE | Admit: 2024-01-07 | Discharge: 2024-01-07 | Disposition: A | Source: Ambulatory Visit | Attending: Radiation Oncology

## 2024-01-07 ENCOUNTER — Other Ambulatory Visit: Payer: Self-pay

## 2024-01-07 ENCOUNTER — Ambulatory Visit: Admission: RE | Admit: 2024-01-07 | Discharge: 2024-01-07 | Attending: Radiation Oncology

## 2024-01-07 DIAGNOSIS — Z510 Encounter for antineoplastic radiation therapy: Secondary | ICD-10-CM | POA: Diagnosis not present

## 2024-01-07 LAB — RAD ONC ARIA SESSION SUMMARY
Course Elapsed Days: 18
Plan Fractions Treated to Date: 13
Plan Prescribed Dose Per Fraction: 2.5 Gy
Plan Total Fractions Prescribed: 13
Plan Total Prescribed Dose: 32.5 Gy
Reference Point Dosage Given to Date: 32.5 Gy
Reference Point Session Dosage Given: 2.5 Gy
Session Number: 13

## 2024-01-11 NOTE — Radiation Completion Notes (Signed)
 Patient Name: [name] [name], [name] [name] MRN: [id number] Date of Birth: May 18, 1945 Referring Physician: ARTHEA MANNS, M.D. Date of Service: 2024-01-11 Radiation Oncologist: Lauraine Golden, M.D.  [facility]                             RADIATION ONCOLOGY END OF TREATMENT NOTE     Diagnosis: C71.1 Malignant neoplasm of frontal lobe Intent: Palliative     ==========DELIVERED PLANS==========  First Treatment Date: 2023-12-20 Last Treatment Date: 2024-01-07   Plan Name: Brain_Whole Site: Brain Technique: 3D Mode: Photon Dose Per Fraction: 2.5 Gy Prescribed Dose (Delivered / Prescribed): 32.5 Gy / 32.5 Gy Prescribed Fxs (Delivered / Prescribed): 13 / 13     ==========ON TREATMENT VISIT DATES========== 2023-12-20, 2023-12-27, 2024-01-03, 2024-01-07     ==========UPCOMING VISITS========== 03/02/2024 [facility]UR[facility] [place] MOVEMENT DISORDERS 60 Tat, Asberry RAMAN, DO  02/08/2024 [facility] ONCOLOGY EST PT 30 Manns Arthea POUR, MD  02/02/2024 [facility] MR BRAIN W WO CONTRAST [facility] 1  01/20/2024 [facility]RADIATION ONC FOLLOW UP 20 Golden Lauraine, MD  01/19/2024 [facility] ONCOL[facility]Y NEW PALLIATIVE CARE [facility] PALLIATIVE CARE        ==========APPENDIX - ON TREATMENT VISIT NOTES==========   See weekly On Treatment Notes in Epic for details in the Media tab (listed as Progress notes on the On Treatment Visit Dates listed above).

## 2024-01-12 NOTE — Progress Notes (Signed)
*  pt name* presents today for follow up after completing   Recent neurologic symptoms, if any:  Seizures: {:18581} Headaches: {:18581} Nausea: {:18581} Dizziness/ataxia: {:18581} Difficulty with hand coordination: {:18581} Focal numbness/weakness: {:18581} Visual deficits/changes: {:18581} Confusion/Memory deficits: {:18581}  Other issues of note:

## 2024-01-19 ENCOUNTER — Inpatient Hospital Stay

## 2024-01-20 ENCOUNTER — Ambulatory Visit
Admission: RE | Admit: 2024-01-20 | Discharge: 2024-01-20 | Disposition: A | Discharge status: Home / Self Care | Source: Ambulatory Visit | Attending: Radiation Oncology | Admitting: Radiation Oncology

## 2024-02-02 ENCOUNTER — Ambulatory Visit (HOSPITAL_COMMUNITY)
Admission: RE | Admit: 2024-02-02 | Discharge: 2024-02-02 | Disposition: A | Discharge status: Home / Self Care | Source: Ambulatory Visit | Attending: Internal Medicine | Admitting: Internal Medicine

## 2024-02-02 DIAGNOSIS — C8339 Primary central nervous system lymphoma: Secondary | ICD-10-CM | POA: Insufficient documentation

## 2024-02-02 MED ORDER — GADOBUTROL 1 MMOL/ML IV SOLN
7.0000 mL | Freq: Once | INTRAVENOUS | Status: AC | PRN
  Administered 2024-02-02: 7 mL via INTRAVENOUS

## 2024-02-08 ENCOUNTER — Inpatient Hospital Stay: Attending: Neurosurgery | Admitting: Internal Medicine

## 2024-02-08 VITALS — BP 126/94 | HR 108 | Temp 97.0°F | Resp 18

## 2024-02-08 DIAGNOSIS — Z923 Personal history of irradiation: Secondary | ICD-10-CM | POA: Insufficient documentation

## 2024-02-08 DIAGNOSIS — Z809 Family history of malignant neoplasm, unspecified: Secondary | ICD-10-CM | POA: Diagnosis not present

## 2024-02-08 DIAGNOSIS — Z79899 Other long term (current) drug therapy: Secondary | ICD-10-CM | POA: Diagnosis not present

## 2024-02-08 DIAGNOSIS — R569 Unspecified convulsions: Secondary | ICD-10-CM | POA: Diagnosis not present

## 2024-02-08 DIAGNOSIS — C8339 Primary central nervous system lymphoma: Secondary | ICD-10-CM | POA: Diagnosis not present

## 2024-02-08 MED ORDER — DEXAMETHASONE 1 MG PO TABS: ORAL_TABLET | ORAL | 0 refills | Status: AC

## 2024-02-08 NOTE — Progress Notes (Signed)
 "  [facility] at [facility] [address]  [place], [place] [address] [phone]   Interval Evaluation  Date of Service: 02/08/2024 Patient Name: [name] [name] Patient MRN: [id number] Patient DOB: 03/20/1945 Provider: Arthea MARLA Manns, MD  Identifying Statement:  [name] [name] is a 79 y.o. male with left frontal primary CNS lymphoma  Oncologic History: 10/11/23: Stereotactic biopsy with Dr. Lanis; path is indeterminate but suggestive of lymphoma  01/07/24: Completes 3 week course WBRT with Dr. Izell  Interval History: [name] [name] presents today for follow up, now having completed radiation therapy, recent MRI brain.  He and his family describe worsening weakness of both legs over the past several weeks.  It is very difficult for him to stand from a seated position, like getting up from the commode.  It seems to affect both legs equally.  Otherwise there are no changes with regards to issues with expressive language, balance, fatigue.  He does have some periods of confusion or disconnection when standing at times, but these are self limited and do not lead to post-event confusion.  Continues on decadron  4mg  in the morning.  Prior (01/03/24) He and his family describe some improvement initially during first week of radiation, but that has given way to increased fatigue and lethargy in recent days.  He is again no longer using the walker to walk due to poor balance, only ambulating with a wheelchair.  They do describe a focal seizure on Thanksgiving, he has been dosing Lamictal  100mg  daily.  Family has reported modest improvement in visual hallucinations and irritability since stopping the Keppra , however.  Sleep volume has increased overall but is irregular at night time.  Right side remains somewhat weak.  No frank seizures, headaches.  Continues on decadron  8mg  in the morning.  No other medication changes.  H+P (09/28/23) Patient presented to neurologic  attention this month with several days history of slurred speech and right sided weakness.  He also had seizure episode described as staring head turned to left, followed by 1-2 days of confusion.  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  Was started on decadron  4mg  twice per day, which has ultimately not led to improvement in his symptoms.  He has baseline mobility issues from Parkinson's disease, had a deep brain stimulator placed in 2014.  Keppra  remains at 500mg  twice per day without issue.  Medications: Current Outpatient Medications on File Prior to Visit  Medication Sig Dispense Refill   carbidopa -levodopa  (SINEMET  CR) 50-200 MG tablet Take 1 tablet by mouth at bedtime.     carbidopa -levodopa  (SINEMET  IR) 25-100 MG tablet 2-2.5 tablets 4 times per day at 6am/10:30am/2pm/6pm     cyanocobalamin  1000 MCG tablet Take 1 tablet (1,000 mcg total) by mouth daily. 30 tablet 0   dexamethasone  (DECADRON ) 4 MG tablet Take 1 tablet (4 mg total) by mouth daily.     fluticasone  (FLONASE ) 50 MCG/ACT nasal spray Place 2 sprays into both nostrils daily. (Patient not taking: Reported on 12/14/2023) 16 g 0   lamoTRIgine  (LAMICTAL ) 100 MG tablet Take 1 tablet (100 mg total) by mouth 2 (two) times daily. 60 tablet 2   midodrine  (PROAMATINE ) 5 MG tablet Take 1 tablet (5 mg total) by mouth 3 (three) times daily with meals. (Patient not taking: Reported on 01/03/2024) 270 tablet 1   ondansetron  (ZOFRAN -ODT) 8 MG disintegrating tablet Take 1 tablet (8 mg total) by mouth every 8 (eight) hours as needed for nausea or  vomiting. (Patient not taking: Reported on 01/03/2024) 20 tablet 3   pantoprazole  (PROTONIX ) 40 MG tablet TAKE 1 TABLET BY MOUTH EVERY DAY 90 tablet 1   traZODone  (DESYREL ) 50 MG tablet TAKE 1 TABLET BY MOUTH EVERYDAY AT BEDTIME (Patient not taking: Reported on 12/14/2023) 90 tablet 1   traZODone  (DESYREL ) 50 MG tablet Take 2 tablets (100 mg total) by mouth at bedtime. 60 tablet  1   No current facility-administered medications on file prior to visit.    Allergies:  Allergies  Allergen Reactions   Alfuzosin  Other (See Comments)    Hypotension    Rivastigmine Tartrate Nausea And Vomiting     Can tolerate the patch but can not tolerate the pills   Tamsulosin Other (See Comments)    Severe joint pain   Past Medical History:  Past Medical History:  Diagnosis Date   Agent orange exposure    Anemia    BPH (benign prostatic hypertrophy)    Cancer (HCC)    basal cell temple   Complication of anesthesia    confusion and combative when waking up from last kidney procedure   Constipation    Depression    Fatigue    d/t Parkinson's   GERD (gastroesophageal reflux disease)    occasional   History of kidney stones    Left ureteral calculus    Lower urinary tract symptoms (LUTS)    Nephrolithiasis    Neuromuscular disorder (HCC)     parkinson's   Orthostatic hypotension    d/t Parkinson's disease   Orthostatic hypotension    Parkinson's disease (HCC)    Peyronie disease    Pneumonia 2018   Renal cyst, left    S/P deep brain stimulator placement    11-29-2012   Seizure (HCC)    pt and wife report that he has had seizures and is now on seizure medicine   Sleep behavior disorder, REM    Wears glasses    Wears hearing aid    BILATERAL-- INTERMITTANT WEARS   Past Surgical History:  Past Surgical History:  Procedure Laterality Date   APPLICATION OF CRANIAL NAVIGATION Left 10/11/2023   Procedure: COMPUTER-ASSISTED NAVIGATION, FOR CRANIAL PROCEDURE;  Surgeon: Lanis Pupa, MD;  Location: [facility] OR;  Service: Neurosurgery;  Laterality: Left;   CATARACT EXTRACTION Bilateral    CHOLECYSTECTOMY  12/21/2014   Procedure: LAPAROSCOPIC CHOLECYSTECTOMY;  Surgeon: Lynda Leos, MD;  Location: [facility] OR;  Service: General;;   COLONOSCOPY     CYSTO/  LEFT URETEROSOCPY/   LEFT URETERAL STENT PLACEMENT/  LASER BLADDER STONES AND EXTRACTION  09/25/2010   CYSTOSCOPY  WITH INSERTION OF UROLIFT N/A 03/11/2017   Procedure: CYSTOSCOPY WITH INSERTION OF UROLIFT, LITHOPAXY,STONE OBTAINED;  Surgeon: Matilda Senior, MD;  Location: [facility];  Service: Urology;  Laterality: N/A;   CYSTOSCOPY WITH LITHOLAPAXY N/A 09/12/2020   Procedure: CYSTOSCOPY WITH LITHOLAPAXY, LEFT RETROGRADE PYELOGRAMS, LEFT URETEROSCOPY, AND STENT PLACEMENT;  Surgeon: Cam Morene ORN, MD;  Location: [facility];  Service: Urology;  Laterality: N/A;  REQUESTING 2 HRS   CYSTOSCOPY WITH LITHOLAPAXY Right 07/14/2023   Procedure: CYSTOSCOPY, WITH BLADDER CALCULUS LITHOLAPAXY;  Surgeon: Cam Morene ORN, MD;  Location: [facility];  Service: Urology;  Laterality: Right;  CYSTOLITHOLAPAXY, RIGHT URETEROSCOPY, LASER LITHOTRIPSY, STONE EXTRACTION, RIGHT URETERAL STENT PLACEMENT   CYSTOSCOPY WITH RETROGRADE PYELOGRAM, URETEROSCOPY AND STENT PLACEMENT Left 01/06/2013   Procedure: CYSTOSCOPY WITH RETROGRADE PYELOGRAM, URETEROSCOPY AND STENT PLACEMENT;  Surgeon: Thomasine Oiler, MD;  Location: [facility];  Service: Urology;  Laterality: Left;   CYSTOSCOPY WITH RETROGRADE PYELOGRAM, URETEROSCOPY AND STENT PLACEMENT Left 09/11/2014   Procedure: CYSTOSCOPY WITH LEFT RETROGRADE PYELOGRAM, URETEROSCOPY AND STENT PLACEMENT;  Surgeon: Oliva Oiler, MD;  Location: [facility];  Service: Urology;  Laterality: Left;   CYSTOSCOPY/RETROGRADE/URETEROSCOPY/STONE EXTRACTION WITH BASKET Right 07/14/2023   Procedure: CYSTOSCOPY, WITH CALCULUS REMOVAL USING BASKET;  Surgeon: Cam Morene ORN, MD;  Location: [facility];  Service: Urology;  Laterality: Right;   CYSTOSCOPY/URETEROSCOPY/HOLMIUM LASER/STENT PLACEMENT Right 07/14/2023   Procedure: CYSTOSCOPY/URETEROSCOPY/HOLMIUM LASER/STENT PLACEMENT;  Surgeon: Cam Morene ORN, MD;  Location: [facility];  Service: Urology;  Laterality: Right;   DEEP BRAIN STIMULATOR PLACEMENT  11/29/2012   genertor device at left pectoral  area-   EXTRACORPOREAL SHOCK WAVE LITHOTRIPSY Left 10-11-2010  &  01-05-2011   HOLMIUM LASER APPLICATION Left 01/06/2013   Procedure: HOLMIUM LASER APPLICATION;  Surgeon: Thomasine Oiler, MD;  Location: [facility];  Service: Urology;  Laterality: Left;   HOLMIUM LASER APPLICATION Left 09/11/2014   Procedure: HOLMIUM LASER APPLICATION;  Surgeon: Oliva Oiler, MD;  Location: [facility];  Service: Urology;  Laterality: Left;   HOLMIUM LASER APPLICATION N/A 03/11/2017   Procedure: HOLMIUM LASER APPLICATION;  Surgeon: Matilda Senior, MD;  Location: [facility];  Service: Urology;  Laterality: N/A;   NEGATIVE SLEEP STUDY  2013  per pt   NEPHROLITHOTOMY Right 08/13/2022   Procedure: RIGHT NEPHROLITHOTOMY PERCUTANEOUS;  Surgeon: Cam Morene ORN, MD;  Location: [facility];  Service: Urology;  Laterality: Right;  210 MINUTES   PR DURAL GRAFT SPINAL Left 10/11/2023   Procedure: FRAMELESS STEREOTACTIC BIOPSY;  Surgeon: Lanis Pupa, MD;  Location: [facility] OR;  Service: Neurosurgery;  Laterality: Left;  STEREOTACTIC LEFT BRAIN BX   SP PERC NEPHROSTOMY Left 12/16/2012   STONE EXTRACTION WITH BASKET Left 01/06/2013   Procedure: STONE EXTRACTION WITH BASKET;  Surgeon: Thomasine Oiler, MD;  Location: [facility];  Service: Urology;  Laterality: Left;   TONSILLECTOMY     TRANSURETHRAL RESECTION OF PROSTATE N/A 09/12/2020   Procedure: TRANSURETHRAL RESECTION OF THE PROSTATE (TURP);  Surgeon: Cam Morene ORN, MD;  Location: [facility];  Service: Urology;  Laterality: N/A;   Social History:  Social History   Socioeconomic History   Marital status: Married    Spouse name: Not on file   Number of children: Not on file   Years of education: Not on file   Highest education level: Not on file  Occupational History   Not on file  Tobacco Use   Smoking status: Never   Smokeless tobacco: Never  Vaping Use   Vaping status: Never  Used  Substance and Sexual Activity   Alcohol  use: Not Currently    Comment: rarely beer   Drug use: No   Sexual activity: Not Currently  Other Topics Concern   Not on file  Social History Narrative   Not on file   Social Drivers of Health   Tobacco Use: Low Risk (12/14/2023)   Patient History    Smoking Tobacco Use: Never    Smokeless Tobacco Use: Never    Passive Exposure: Not on file  Financial Resource Strain: Not on file  Food Insecurity: No Food Insecurity (10/11/2023)   Epic    Worried About Programme Researcher, Broadcasting/film/video in the Last Year: Never true    Ran Out of Food in the Last Year: Never true  Transportation Needs: No Transportation Needs (10/11/2023)   Epic  Lack of Transportation (Medical): No    Lack of Transportation (Non-Medical): No  Physical Activity: Not on file  Stress: Not on file  Social Connections: Moderately Isolated (10/11/2023)   Social Connection and Isolation Panel    Frequency of Communication with Friends and Family: Three times a week    Frequency of Social Gatherings with Friends and Family: Three times a week    Attends Religious Services: Never    Active Member of Clubs or Organizations: No    Attends Banker Meetings: Never    Marital Status: Married  Catering Manager Violence: Not At Risk (10/11/2023)   Epic    Fear of Current or Ex-Partner: No    Emotionally Abused: No    Physically Abused: No    Sexually Abused: No  Depression (PHQ2-9): Low Risk (01/03/2024)   Depression (PHQ2-9)    PHQ-2 Score: 0  Alcohol  Screen: Not on file  Housing: Low Risk (10/11/2023)   Epic    Unable to Pay for Housing in the Last Year: No    Number of Times Moved in the Last Year: 0    Homeless in the Last Year: No  Utilities: Not At Risk (10/11/2023)   Epic    Threatened with loss of utilities: No  Health Literacy: Not on file   Family History:  Family History  Problem Relation Age of Onset   Hypertension Mother    Dementia Father    Cancer Father     Heart attack Sister    Arthritis/Rheumatoid Sister     Review of Systems: Constitutional: Doesn't report fevers, chills or abnormal weight loss Eyes: Doesn't report blurriness of vision Ears, nose, mouth, throat, and face: Doesn't report sore throat Respiratory: Doesn't report cough, dyspnea or wheezes Cardiovascular: Doesn't report palpitation, chest discomfort  Gastrointestinal:  Doesn't report nausea, constipation, diarrhea GU: Doesn't report incontinence Skin: Doesn't report skin rashes Neurological: Per HPI Musculoskeletal: Doesn't report joint pain Behavioral/Psych: Doesn't report anxiety  Physical Exam: Vitals:   02/08/24 1150  BP: (!) 126/94  Pulse: (!) 108  Resp: 18  Temp: (!) 97 F (36.1 C)  SpO2: 98%   KPS: 60. General: Alert, cooperative, pleasant, in no acute distress Head: Normal EENT: No conjunctival injection or scleral icterus.  Lungs: Resp effort normal Cardiac: Regular rate Abdomen: Non-distended abdomen Skin: No rashes cyanosis or petechiae. Extremities: No clubbing or edema  Neurologic Exam: Mental Status: Awake, alert, attentive to examiner. Oriented to self and environment. Language is fluent with intact comprehension.  Pyschomotor slowing. Hypophonic Cranial Nerves: Visual acuity is grossly normal. Visual fields are full. Extra-ocular movements intact. No ptosis. Face is symmetric Motor: Parkinsonian tremor with cogwheeling. Power is 4/5 in both legs, proximal greater than distal. Reflexes are symmetric, no pathologic reflexes present.  Sensory: Intact to light touch Gait: Non ambulatory   Labs: I have reviewed the data as listed    Component Value Date/Time   NA 135 01/03/2024 1422   K 4.2 01/03/2024 1422   CL 102 01/03/2024 1422   CO2 22 01/03/2024 1422   GLUCOSE 199 (H) 01/03/2024 1422   BUN 30 (H) 01/03/2024 1422   CREATININE 0.98 01/03/2024 1422   CALCIUM  8.2 (L) 01/03/2024 1422   PROT 5.8 (L) 01/03/2024 1422   ALBUMIN 3.2 (L)  01/03/2024 1422   AST 22 01/03/2024 1422   ALT 10 01/03/2024 1422   ALKPHOS 58 01/03/2024 1422   BILITOT 0.6 01/03/2024 1422   GFRNONAA >60 01/03/2024 1422   GFRAA >60  12/12/2014 1056   Lab Results  Component Value Date   WBC 11.9 (H) 01/03/2024   NEUTROABS 10.4 (H) 01/03/2024   HGB 13.8 01/03/2024   HCT 40.5 01/03/2024   MCV 92.5 01/03/2024   PLT 261 01/03/2024    Imaging:  [facility] Clinician Interpretation: I have personally reviewed the CNS images as listed.  My interpretation, in the context of the patient's clinical presentation, is stable disease  MR BRAIN W WO CONTRAST Result Date: 02/02/2024 EXAM: MRI BRAIN WITH AND WITHOUT CONTRAST 02/02/2024 12:34:52 PM TECHNIQUE: Multiplanar multisequence MRI of the head/brain was performed with and without the administration of intravenous contrast. CONTRAST: 7 mL of Gadavist  (gadobutrol  1 MMOL/ML injection). COMPARISON: MR Head without then with IV contrast 11/25/2023. CLINICAL HISTORY: Brain/CNS neoplasm, assess treatment response. Primary CNS lymphoma. FINDINGS: BRAIN AND VENTRICLES: No acute infarct. No acute intracranial hemorrhage. No mass effect or midline shift. No hydrocephalus. The sella is unremarkable. Normal flow voids. No mass or abnormal enhancement. No restricted diffusion or focal enhancement is present to suggest recurrent tumor. Bilateral deep brain stimulators are stable. Moderate atrophy is present. The confluent periventricular and subcortical T2 and FLAIR hyperintensities demonstrate some progression since the prior exams, consistent with sequelae of whole brain radiation. ORBITS: No acute abnormality. SINUSES: No acute abnormality. BONES AND SOFT TISSUES: Normal bone marrow signal and enhancement. No acute soft tissue abnormality. IMPRESSION: 1. No restricted diffusion or focal enhancement to suggest recurrent primary CNS lymphoma. 2. Bilateral deep brain stimulators are stable. 3. Confluent periventricular and subcortical T2  and FLAIR hyperintensities demonstrate some progression since the prior exams, consistent with sequelae of whole brain radiation. Electronically signed by: Lonni Necessary MD 02/02/2024 02:10 PM EST RP Workstation: [id number]    Assessment/Plan Primary CNS lymphoma (HCC)  [name] [name] is clinically stable today from focal standpoint, now having completed whole brain radiation, recent MRI brain.  MRI demonstrates very good response to therapy, with significant decrease in burden of enhancing disease within the left hemisphere.  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  No further breakthrough seizures since increasing Lamictal  to 100mg  BID.    Recommended decreasing decadron  by 1mg  each week, starting with 3mg  daily tomorrow.  Dose may be modified if focal symptoms recur.  Will need to continue to work aggressively with PT to regain strength in his legs.    He will con't to follow with radiation onocology, palliative care, as well as Dr. Evonnie for Parkinsons.  We ask that [name] [name] return to clinic in 2 months following next brain MRI, or sooner as needed.  All questions were answered. The patient knows to call the clinic with any problems, questions or concerns. No barriers to learning were detected.  The total time spent in the encounter was 40 minutes and more than 50% was on counseling and review of test results   Arthea MARLA Manns, MD Medical Director of Neuro-Oncology [facility] [place] [facility] 02/08/2024 11:36 AM "

## 2024-02-09 ENCOUNTER — Telehealth: Payer: Self-pay | Admitting: Internal Medicine

## 2024-02-09 NOTE — Telephone Encounter (Signed)
 Scheduled patient for next appointment. Called and spoke with the patients wife, she is aware.

## 2024-02-24 ENCOUNTER — Encounter: Payer: Self-pay | Admitting: Nurse Practitioner

## 2024-02-24 ENCOUNTER — Inpatient Hospital Stay (HOSPITAL_BASED_OUTPATIENT_CLINIC_OR_DEPARTMENT_OTHER): Admitting: Nurse Practitioner

## 2024-02-24 VITALS — BP 95/59 | HR 95 | Temp 97.6°F | Resp 17 | Ht 70.0 in

## 2024-02-24 DIAGNOSIS — C8339 Primary central nervous system lymphoma: Secondary | ICD-10-CM | POA: Diagnosis not present

## 2024-02-24 DIAGNOSIS — Z515 Encounter for palliative care: Secondary | ICD-10-CM | POA: Diagnosis not present

## 2024-02-24 DIAGNOSIS — G4709 Other insomnia: Secondary | ICD-10-CM | POA: Diagnosis not present

## 2024-02-24 DIAGNOSIS — Z7189 Other specified counseling: Secondary | ICD-10-CM

## 2024-02-24 DIAGNOSIS — C711 Malignant neoplasm of frontal lobe: Secondary | ICD-10-CM

## 2024-02-24 DIAGNOSIS — G20A1 Parkinson's disease without dyskinesia, without mention of fluctuations: Secondary | ICD-10-CM

## 2024-02-24 DIAGNOSIS — R531 Weakness: Secondary | ICD-10-CM | POA: Diagnosis not present

## 2024-02-24 DIAGNOSIS — D496 Neoplasm of unspecified behavior of brain: Secondary | ICD-10-CM

## 2024-02-24 DIAGNOSIS — N390 Urinary tract infection, site not specified: Secondary | ICD-10-CM | POA: Diagnosis not present

## 2024-02-24 DIAGNOSIS — G9341 Metabolic encephalopathy: Secondary | ICD-10-CM

## 2024-02-24 NOTE — Progress Notes (Signed)
 Updated orders placed for [facility] PT

## 2024-02-24 NOTE — Progress Notes (Unsigned)
 "    Palliative Medicine [facility]  Telephone:([phone]) [phone] Fax:([phone]) [phone]   Name: [name] [name] Date: 02/24/2024 MRN: [id number]  DOB: 10-07-1945  Patient Care Team: Beverley Louann ONEIDA, MD as PCP - General (Internal Medicine) Court Dorn PARAS, MD as PCP - Cardiology (Cardiology) Tat, Asberry RAMAN, DO as Consulting Physician (Neurology)    REASON FOR CONSULTATION: [name] [name] is a 79 y.o. male with oncologic medical history including left frontal CNS lymphoma s/p WBRT, Parkinson's.  Palliative is seeing patient for symptom management and goals of care.    SOCIAL HISTORY:     reports that he has never smoked. He has never used smokeless tobacco. He reports that he does not currently use alcohol . He reports that he does not use drugs.  ADVANCE DIRECTIVES:  None on file.  Patient reports he has documented directives identifying his wife [name] [name] as primary museum/gallery exhibitions officer.  CODE STATUS: Full code  PAST MEDICAL HISTORY: Past Medical History:  Diagnosis Date   Agent orange exposure    Anemia    BPH (benign prostatic hypertrophy)    Cancer (HCC)    basal cell temple   Complication of anesthesia    confusion and combative when waking up from last kidney procedure   Constipation    Depression    Fatigue    d/t Parkinson's   GERD (gastroesophageal reflux disease)    occasional   History of kidney stones    Left ureteral calculus    Lower urinary tract symptoms (LUTS)    Nephrolithiasis    Neuromuscular disorder (HCC)     parkinson's   Orthostatic hypotension    d/t Parkinson's disease   Orthostatic hypotension    Parkinson's disease (HCC)    Peyronie disease    Pneumonia 2018   Renal cyst, left    S/P deep brain stimulator placement    11-29-2012   Seizure (HCC)    pt and wife report that he has had seizures and is now on seizure medicine   Sleep behavior disorder, REM    Wears glasses    Wears hearing aid    BILATERAL-- INTERMITTANT WEARS     PAST SURGICAL HISTORY:  Past Surgical History:  Procedure Laterality Date   APPLICATION OF CRANIAL NAVIGATION Left 10/11/2023   Procedure: COMPUTER-ASSISTED NAVIGATION, FOR CRANIAL PROCEDURE;  Surgeon: Lanis Pupa, MD;  Location: [facility];  Service: Neurosurgery;  Laterality: Left;   CATARACT EXTRACTION Bilateral    CHOLECYSTECTOMY  12/21/2014   Procedure: LAPAROSCOPIC CHOLECYSTECTOMY;  Surgeon: Lynda Leos, MD;  Location: [facility] OR;  Service: General;;   COLONOSCOPY     CYSTO/  LEFT URETEROSOCPY/   LEFT URETERAL STENT PLACEMENT/  LASER BLADDER STONES AND EXTRACTION  09/25/2010   CYSTOSCOPY WITH INSERTION OF UROLIFT N/A 03/11/2017   Procedure: CYSTOSCOPY WITH INSERTION OF UROLIFT, LITHOPAXY,STONE OBTAINED;  Surgeon: Matilda Senior, MD;  Location: [facility];  Service: Urology;  Laterality: N/A;   CYSTOSCOPY WITH LITHOLAPAXY N/A 09/12/2020   Procedure: CYSTOSCOPY WITH LITHOLAPAXY, LEFT RETROGRADE PYELOGRAMS, LEFT URETEROSCOPY, AND STENT PLACEMENT;  Surgeon: Cam Morene ORN, MD;  Location: [facility];  Service: Urology;  Laterality: N/A;  REQUESTING 2 HRS   CYSTOSCOPY WITH LITHOLAPAXY Right 07/14/2023   Procedure: CYSTOSCOPY, WITH BLADDER CALCULUS LITHOLAPAXY;  Surgeon: Cam Morene ORN, MD;  Location: [facility];  Service: Urology;  Laterality: Right;  CYSTOLITHOLAPAXY, RIGHT URETEROSCOPY, LASER LITHOTRIPSY, STONE EXTRACTION, RIGHT URETERAL STENT PLACEMENT   CYSTOSCOPY WITH RETROGRADE PYELOGRAM, URETEROSCOPY AND STENT  PLACEMENT Left 01/06/2013   Procedure: CYSTOSCOPY WITH RETROGRADE PYELOGRAM, URETEROSCOPY AND STENT PLACEMENT;  Surgeon: Thomasine Oiler, MD;  Location[facility];  Service: Urology;  Laterality: Left;   CYSTOSCOPY WITH RETROGRADE PYELOGRAM, URETEROSCOPY AND STENT PLACEMENT Left 09/11/2014   Procedure: CYSTOSCOPY WITH LEFT RETROGRADE PYELOGRAM, URETEROSCOPY AND STENT PLACEMENT;  Surgeon: Oliva Oiler, MD;  Location: [facility];  Service: Urology;  Laterality: Left;   CYSTOSCOPY/RETROGRADE/URETEROSCOPY/STONE EXTRACTION WITH BASKET Right 07/14/2023   Procedure: CYSTOSCOPY, WITH CALCULUS REMOVAL USING BASKET;  Surgeon: Cam Morene ORN, MD;  Location: [facility];  Service: Urology;  Laterality: Right;   CYSTOSCOPY/URETEROSCOPY/HOLMIUM LASER/STENT PLACEMENT Right 07/14/2023   Procedure: CYSTOSCOPY/URETEROSCOPY/HOLMIUM LASER/STENT PLACEMENT;  Surgeon: Cam Morene ORN, MD;  Location: [facility];  Service: Urology;  Laterality: Right;   DEEP BRAIN STIMULATOR PLACEMENT  11/29/2012   genertor device at left pectoral area-   EXTRACORPOREAL SHOCK WAVE LITHOTRIPSY Left 10-11-2010  &  01-05-2011   HOLMIUM LASER APPLICATION Left 01/06/2013   Procedure: HOLMIUM LASER APPLICATION;  Surgeon: Thomasine Oiler, MD;  Location: [facility];  Service: Urology;  Laterality: Left;   HOLMIUM LASER APPLICATION Left 09/11/2014   Procedure: HOLMIUM LASER APPLICATION;  Surgeon: Oliva Oiler, MD;  Location: [facility];  Service: Urology;  Laterality: Left;   HOLMIUM LASER APPLICATION N/A 03/11/2017   Procedure: HOLMIUM LASER APPLICATION;  Surgeon: Matilda Senior, MD;  Location[facility];  Service: Urology;  Laterality: N/A;   NEGATIVE SLEEP STUDY  2013  per pt   NEPHROLITHOTOMY Right 08/13/2022   Procedure: RIGHT NEPHROLITHOTOMY PERCUTANEOUS;  Surgeon: Cam Morene ORN, MD;  Location: [facility];  Service: Urology;  Laterality: Right;  210 MINUTES   PR DURAL GRAFT SPINAL Left 10/11/2023   Procedure: FRAMELESS STEREOTACTIC BIOPSY;  Surgeon: Lanis Pupa, MD;  Location: [facility];  Service: Neurosurgery;  Laterality: Left;  STEREOTACTIC LEFT BRAIN BX   SP PERC NEPHROSTOMY Left 12/16/2012   STONE EXTRACTION WITH BASKET Left 01/06/2013   Procedure: STONE EXTRACTION WITH BASKET;  Surgeon: Thomasine Oiler, MD;  Location: [facility];  Service: Urology;  Laterality: Left;    TONSILLECTOMY     TRANSURETHRAL RESECTION OF PROSTATE N/A 09/12/2020   Procedure: TRANSURETHRAL RESECTION OF THE PROSTATE (TURP);  Surgeon: Cam Morene ORN, MD;  Location: [facility];  Service: Urology;  Laterality: N/A;    HEMATOLOGY/ONCOLOGY HISTORY:  Oncology History   No problem history exists.    ALLERGIES:  is allergic to alfuzosin , rivastigmine tartrate, and tamsulosin.  MEDICATIONS:  Current Outpatient Medications  Medication Sig Dispense Refill   carbidopa -levodopa  (SINEMET  CR) 50-200 MG tablet Take 1 tablet by mouth at bedtime.     carbidopa -levodopa  (SINEMET  IR) 25-100 MG tablet 2-2.5 tablets 4 times per day at 6am/10:30am/2pm/6pm     cyanocobalamin  1000 MCG tablet Take 1 tablet (1,000 mcg total) by mouth daily. 30 tablet 0   dexamethasone  (DECADRON ) 1 MG tablet Take 3 tablets (3 mg total) by mouth daily with breakfast for 7 days, THEN 2 tablets (2 mg total) daily with breakfast for 7 days, THEN 1 tablet (1 mg total) daily with breakfast for 7 days. 42 tablet 0   fluticasone  (FLONASE ) 50 MCG/ACT nasal spray Place 2 sprays into both nostrils daily. (Patient not taking: Reported on 12/14/2023) 16 g 0   lamoTRIgine  (LAMICTAL ) 100 MG tablet Take 1 tablet (100 mg total) by mouth 2 (two) times daily. 60 tablet 2   midodrine  (PROAMATINE ) 5 MG tablet Take  1 tablet (5 mg total) by mouth 3 (three) times daily with meals. (Patient not taking: Reported on 01/03/2024) 270 tablet 1   ondansetron  (ZOFRAN -ODT) 8 MG disintegrating tablet Take 1 tablet (8 mg total) by mouth every 8 (eight) hours as needed for nausea or vomiting. (Patient not taking: Reported on 01/03/2024) 20 tablet 3   pantoprazole  (PROTONIX ) 40 MG tablet TAKE 1 TABLET BY MOUTH EVERY DAY 90 tablet 1   traZODone  (DESYREL ) 50 MG tablet TAKE 1 TABLET BY MOUTH EVERYDAY AT BEDTIME (Patient not taking: Reported on 12/14/2023) 90 tablet 1   traZODone  (DESYREL ) 50 MG tablet Take 2 tablets (100 mg total) by mouth at  bedtime. 60 tablet 1   No current facility-administered medications for this visit.    VITAL SIGNS: BP (!) 95/59 (BP Location: Left Arm, Patient Position: Sitting)   Pulse 95   Temp 97.6 F (36.4 C) (Temporal)   Resp 17   Ht 5' 10 (1.778 m)   SpO2 97%   BMI 22.14 kg/m  There were no vitals filed for this visit.  Estimated body mass index is 22.14 kg/m as calculated from the following:   Height as of this encounter: 5' 10 (1.778 m).   Weight as of 10/11/23: 154 lb 5.2 oz (70 kg).  LABS: CBC:    Component Value Date/Time   WBC 11.9 (H) 01/03/2024 1422   WBC 14.6 (H) 09/25/2023 0527   HGB 13.8 01/03/2024 1422   HCT 40.5 01/03/2024 1422   PLT 261 01/03/2024 1422   MCV 92.5 01/03/2024 1422   NEUTROABS 10.4 (H) 01/03/2024 1422   LYMPHSABS 0.4 (L) 01/03/2024 1422   MONOABS 0.3 01/03/2024 1422   EOSABS 0.0 01/03/2024 1422   BASOSABS 0.1 01/03/2024 1422   Comprehensive Metabolic Panel:    Component Value Date/Time   NA 135 01/03/2024 1422   K 4.2 01/03/2024 1422   CL 102 01/03/2024 1422   CO2 22 01/03/2024 1422   BUN 30 (H) 01/03/2024 1422   CREATININE 0.98 01/03/2024 1422   GLUCOSE 199 (H) 01/03/2024 1422   CALCIUM  8.2 (L) 01/03/2024 1422   AST 22 01/03/2024 1422   ALT 10 01/03/2024 1422   ALKPHOS 58 01/03/2024 1422   BILITOT 0.6 01/03/2024 1422   PROT 5.8 (L) 01/03/2024 1422   ALBUMIN 3.2 (L) 01/03/2024 1422    RADIOGRAPHIC STUDIES: MR BRAIN W WO CONTRAST Result Date: 02/02/2024 EXAM: MRI BRAIN WITH AND WITHOUT CONTRAST 02/02/2024 12:34:52 PM TECHNIQUE: Multiplanar multisequence MRI of the head/brain was performed with and without the administration of intravenous contrast. CONTRAST: 7 mL of Gadavist  (gadobutrol  1 MMOL/ML injection). COMPARISON: MR Head without then with IV contrast 11/25/2023. CLINICAL HISTORY: Brain/CNS neoplasm, assess treatment response. Primary CNS lymphoma. FINDINGS: BRAIN AND VENTRICLES: No acute infarct. No acute intracranial hemorrhage. No  mass effect or midline shift. No hydrocephalus. The sella is unremarkable. Normal flow voids. No mass or abnormal enhancement. No restricted diffusion or focal enhancement is present to suggest recurrent tumor. Bilateral deep brain stimulators are stable. Moderate atrophy is present. The confluent periventricular and subcortical T2 and FLAIR hyperintensities demonstrate some progression since the prior exams, consistent with sequelae of whole brain radiation. ORBITS: No acute abnormality. SINUSES: No acute abnormality. BONES AND SOFT TISSUES: Normal bone marrow signal and enhancement. No acute soft tissue abnormality. IMPRESSION: 1. No restricted diffusion or focal enhancement to suggest recurrent primary CNS lymphoma. 2. Bilateral deep brain stimulators are stable. 3. Confluent periventricular and subcortical T2 and FLAIR hyperintensities demonstrate some progression  since the prior exams, consistent with sequelae of whole brain radiation. Electronically signed by: Lonni Necessary MD 02/02/2024 02:10 PM EST RP Workstation: [id number]    PERFORMANCE STATUS (ECOG) : 2 - Symptomatic, <50% confined to bed  Review of Systems  Constitutional:  Positive for activity change and fatigue.  Genitourinary:  Positive for dysuria and frequency.  Neurological:  Positive for weakness.  Psychiatric/Behavioral:  Positive for hallucinations.   Unless otherwise noted, a complete review of systems is negative.  Physical Exam General: NAD, speaks with soft whisper, Weak Cardiovascular: regular rate and rhythm Pulmonary: clear ant fields Abdomen: soft, nontender, + bowel sounds Extremities: no edema, no joint deformities, tremors, leg weakness Skin: no rashes Neurological: Slow to speak, hallucinations  Discussed the use of AI scribe software for clinical note transcription with the patient, who gave verbal consent to proceed.  History of Present Illness ILIAN [name] [name] is a 79 year old male with left frontal  CNS lymphoma and Parkinson's disease who presents to clinic today for initial palliative visit. Patient sitting in wheelchair comfortably. No acute distress. Speaks in whisper. Able to engage in discussions at times. Some confusion and hallucinations noted however family reports this is known baseline recently. He is accompanied   I introduced myself, Maygan RN, and Palliative's role in collaboration with the oncology team. Concept of Palliative Care was introduced as specialized medical care for people and their families living with serious illness.  It focuses on providing relief from the symptoms and stress of a serious illness.  The goal is to improve quality of life for both the patient and the family. Values and goals of care important to patient and family were attempted to be elicited.  Mr. Boliver lives in the home with his wife of more than 50 years. They have two children who live out of town but come in regularly to be of support. Their grandson, Selinda lives in the home with them. He retired from working with the telephone company. Patient served in the [employer] and [place] [employer].   At home Mr. Jacquin is able to assist with some ADLs however limited due to cognitive decline and fatigue. He has caregiver support during the day to assist. He has also been experiencing weakness in his legs and is receiving physical therapy once a week. He is unable to stand independently due to leg weakness and requires assistance for transfers. Family is awaiting hoyer lift from [place]. Sleeps in a hospital bed however [facility] that has ability to go lower to the floor as patient attempts to get out of bed or will intentionally slide out of bed to the floor during vivid military PTSD moments. 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 Wife is hopeful for increase in days with PT to allow him an opportunity to see if he will regain some lower strength as she is  limited in her ability to provide care. Wife is in an mining engineer wheelchair.   His appetite has decreased since tapering off steroids. He is currently taking Sinemet  multiple times a day, but has run out of the extended-release version. He also takes Lamictal  twice a day for seizures and has been using Trazodone  as needed for sleep, though its effectiveness has been inconsistent recently. He started antibiotic treatment for the UTI yesterday and has taken three doses so far.  He has a history of PTSD from his [employer] service in the [employer],  which contributes to his vivid dreams and nighttime disturbances. He has been receiving home health aid services five days a week for assistance with daily activities.  Mr. Gabrielle has a history of vivid dreams and frequent nighttime awakenings that at times causes him distress, which have worsened recently. He sometimes wakes up frequently at night, needing to use the urinal multiple times. His son Selinda assists at night to help calm him during these episodes. His blood pressure is typically low, around 117/60 mmHg, and tends to drop further with activity.  He has been experiencing significant confusion. He was recently diagnosed with a urinary tract infection (UTI). The UTI was identified after a clean catch urine test was performed following the insertion of a catheter. Currently on Keflex  for 10 days.   He has undergone three catheterizations over the past three weeks. The catheters were in place from Tuesday to Friday each week per wife. He has been experiencing hallucinations, which are new since the onset of his current medical issues, and these were previously discussed with providers, some of patients increased confusion and cognitive changes were thought to be related to UTI per medical team.  He is currently tapering off steroids, reducing his dose from four to one milligram, starting this taper on Tuesday.   Denies uncontrolled pain or discomfort.   We  discussed at length safety measures and contacting medical team if condition changes.  Goals of Care We discussed Mr. Robling current illness and what it means in the larger context of his on-going co-morbidities. Natural disease trajectory and expectations were discussed.  Family is realistic in their understanding of incurable cancer and plan of care. They are remaining hopeful for some time to see stability in symptoms. They are hopeful for the best but also preparing for the worst case.   I empathetically approached discussions regarding advanced directives. Wife and son shares patient has a documented AD in the home. His wife and children are medical decision makers. Family plans to bring in at follow-up visit.   Mrs. Nakanishi is clear in expressed wishes to continue to treat the treatable at this time.   I discussed the importance of continued conversation with family and their medical providers regarding overall plan of care and treatment options, ensuring decisions are within the context of the patients values and GOCs.  Assessment & Plan Established therapeutic relationship. Education provided on palliative's role in collaboration with their Oncology/Radiation team.  Parkinson's disease and CNS lymphoma with hallucinations and impaired mobility CNS lymphoma and Parkinson's disease with recent exacerbation of symptoms, including increased confusion and hallucinations, increase in symptoms attributed to a urinary tract infection. Impaired mobility with weakness in legs and difficulty standing. Tapering off steroids due to weakness and hallucinations. Physical therapy is limited to once a week, which may not be sufficient to allow time for improvement. Home health aid provides assistance five days a week. Caregiver reports increased restlessness and vivid dreams, possibly related to UTI. - Continue tapering off steroids as planned and managed by Dr. [name]. - Requested physical therapy  three times a week through [facility]. Allow time for outcomes. - Continue home health aid services five days a week. - Monitor for changes in confusion and hallucinations. - Ensure safety measures are in place to prevent falls, including the use of a Hoyer lift (pending delivery by [place]) and bed alarms.  Urinary tract infection Recent onset of urinary tract infection confirmed by clean catch urine test. Symptoms include increased confusion, increased urination, and  burning sensation during urination. Antibiotic treatment started yesterday. Confusion and restlessness likely related to UTI. - Continue antibiotic treatment (Keflex ) twice a day for ten days as managed by primary team. - Monitor for improvement in confusion and urination frequency. - Use AZO as needed for burning sensation during urination as directed by team.  Goals of Care Wife and son are realistic in their understanding of cancer diagnosis, prognosis, and plan of care. Goals are clearly expressed to continue treating the treatable allowing patient and opportunity to show some stability.  -Advanced Directives have been completed in the home. Wife to bring in copy.  -Wife and children are reported documented decision makers.  -Quality of life is most important.   I will plan to see patient back in 2-3 weeks. Sooner if needed.   Patient expressed understanding and was in agreement with this plan. He also understands that He can call the clinic at any time with any questions, concerns, or complaints.   Thank you for your referral and allowing Palliative to assist in Mr. Klyde Banka Nobbe's care.   Number and complexity of problems addressed: HIGH - 1 or more chronic illnesses with SEVERE exacerbation, progression, or side effects of treatment - advanced cancer, pain. Any controlled substances utilized were prescribed in the context of palliative care.  I personally spent a total of 75 minutes in the care of the patient today including  preparing to see the patient, getting/reviewing separately obtained history, performing a medically appropriate exam/evaluation, counseling and educating, placing orders, referring and communicating with other health care professionals, documenting clinical information in the EHR, and coordinating care. Visit consisted of counseling and education dealing with the complex and emotionally intense issues of symptom management and palliative care in the setting of serious and potentially life-threatening illness.  Signed by: Levon Borer, AGPCNP-BC Palliative Medicine Team/[facility]    "

## 2024-03-01 ENCOUNTER — Other Ambulatory Visit: Payer: Self-pay | Admitting: Nurse Practitioner

## 2024-03-01 ENCOUNTER — Telehealth: Payer: Self-pay

## 2024-03-01 DIAGNOSIS — C8339 Primary central nervous system lymphoma: Secondary | ICD-10-CM

## 2024-03-01 MED ORDER — QUETIAPINE FUMARATE 25 MG PO TABS: 25.0000 mg | ORAL_TABLET | Freq: Every day | ORAL | 0 refills | Status: AC

## 2024-03-01 MED ORDER — QUETIAPINE FUMARATE 25 MG PO TABS: 25.0000 mg | ORAL_TABLET | Freq: Every day | ORAL | 0 refills | Status: DC

## 2024-03-01 NOTE — Progress Notes (Unsigned)
 "   Assessment/Plan:   1.  Parkinsons disease, with dyskinesia with motor fluctuations, diagnosed 2006             -Status post left STN DBS at [facility] in 2014 and status post right STN DBS at [facility] in 2017.  He had his IPGs changed in December, 2022, and both were EOS at the time.  These were changed out by Dr. Leellen.  Patient with [employer] device.  Current batteries are not EOS.  He would like to consider a rechargeable battery in the future.             -[facility] notes that speech difficulties persisted even with changing DBS settings.  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  Would likely do best with outpatient speech therapy, along with his speech device.             -[facility] notes indicate that patient had dystonic posturing of the right arm and hands, even when DBS was off/EOS, but patient notes that he sometimes turns the DBS off just to get relief from the dystonic posturing and pulling of the right arm and then turns it back on.  His appointment was quite lengthy today, but we may need to look into programming of that, although patient was generally happy with the programming of the device.             -[facility] is noted that patient has never had control of right lower extremity tremor, although I did not see much tremor today in the right leg any time I have seen him.             - Continue carbidopa /levodopa , 2-2.5 tablets 4 times per day at 7am/10:30am/2pm/5:30pm for now, but hoping we can get him Vyalev given amount of off time             -He will continue carbidopa /levodopa  50/200 CR at bedtime             -He has been on rasagiline  in the past, but is currently on selegiline, 5 mg twice per day.  I may discontinue that in the future.  I do not think it is particularly helpful, nor does the patient.             - We previously discussed Vyalev and thought he might be a good candidate, but given all of the other things he has going on right now, I  would not recommend it.             -He does have a U step at home.  He also walked on our upright U step actually did quite well with that compared to the regular walkers.  - Patient apparently has [facility] approval to have a home health aide.  They asked me about agencies and we discussed these today.   2.  Neurogenic Orthostatic Hypotension             -Sounds like syncope has been a big issue for him and he follows with cardiology for this.             - ***Continue Florinef  0.2 mg 3 times per day.  This is being RX at [facility].  He is taking KCL with this             -Sounds like he was on midodrine  in the past for just a few pills and he thought  it caused more anxiety.  I told him today that I don't think that it was likely the midodrine , but rather the low blood pressure.  We are going to restart it today.  He was told to take midodrine , 5 mg 3 times per day with meals.  He will keep me updated on his blood pressure.   3.  Nephrolithiasis - Status post lithotripsy - Follows with urology for this and urinary retention  4.  Primary CNS lymphoma  - Patient following with Dr. [name] and Dr. Izell  - Patient has pleated radiation  5.  Possible seizure  - It is unclear if patient had a possible seizure around the time of diagnosis of his CNS lymphoma.  He had some increasing right sided facial drooping and trouble finding his words.  EEG was negative.  He was placed on Keppra , which caused irritability and some possible hallucinations and this was changed to Lamictal .  Management is via neuro oncology, Dr. [name].  6.  Eyelid opening apraxia  - Discussed value of Botox.  He wants to hold on that.  Subjective:   [name] [name] was seen today in follow up for Parkinsons disease.  Pt with wife who supplements hx.  My previous records were reviewed prior to todays visit as well as outside records available to me.  Numerous records have been reviewed.  Much has happened since our last visit.   After deteriorating speech and right-sided weakness and increasing confusion, the patient was taken to the hospital and I had suspected a urinary tract infection, but ultimately after a significant workup the patient was diagnosed with a brain mass that turned out to be primary CNS lymphoma.  Patient is following with Dr. [name] from neuro-oncology and Dr. Izell from radiation oncology.  He completed radiation therapy.  He is still on Decadron .  Just around the time of diagnosis (known brain mass but unknown type) the patient presented back to the emergency room in August for trouble with words and right-sided facial drooping.  He was drooling more.  Arm and leg appeared to be more weak than usual.  Ultimately, because of the brain mass it was felt that this possibly could be seizure.  EEG was negative at the time.  Patient was placed on Keppra .  Ultimately, this was changed to Lamictal  2 months later because of irritability and hallucinations.  In regards to his Parkinson's, his medications are stable.  We had talked about Vyalev last visit, but given the mild changes since last visit, we decided to hold.  Current prescribed movement disorder medications:  Carbidopa /levodopa  25/100, 2.5 tablets 4 times per day  Carbidopa /levodopa  50/200 at bedtime Inbrija  as needed ( Selegiline 5 mg twice per day Florinef  0.2 mg tid (6am/11am/5pm) -(off now)   PREVIOUS MEDICATIONS: carbidopa  levodopa ; rotigotine  patch (compulsive behavior); clonazepam  (stopped because of increased falls, but he was on a large dose of up to 2 mg); rasagiline ; selegiline; midodrine  (sounds like he tried it once and thought he had some anxiety and jitteriness with it)   ALLERGIES:   Allergies  Allergen Reactions   Alfuzosin  Other (See Comments)    Hypotension    Rivastigmine Tartrate Nausea And Vomiting     Can tolerate the patch but can not tolerate the pills   Tamsulosin Other (See Comments)    Severe joint pain    CURRENT  MEDICATIONS:  No outpatient medications have been marked as taking for the 03/02/24 encounter (Appointment) with Chaniqua Brisby, Asberry RAMAN, DO.  Objective:   PHYSICAL EXAMINATION:    VITALS:   There were no vitals filed for this visit.   GEN:  The patient appears stated age and is in NAD. HEENT:  Normocephalic, atraumatic.  The mucous membranes are moist. The superficial temporal arteries are without ropiness or tenderness. CV:  RRR Lungs:  CTAB Neck/HEME:  There are no carotid bruits bilaterally.  Neurological examination:  Orientation: The patient is alert and oriented x3.  Cranial nerves: There is good facial symmetry. Extraocular muscles are intact. The visual fields are full to confrontational testing. The speech is fluent and very hypophonic (virtually untelligible at times and always at a whisper - same as prior). Soft palate rises symmetrically and there is no tongue deviation. Hearing is intact to conversational tone. Sensation: Sensation is intact to light and pinprick throughout (facial, trunk, extremities). Vibration is intact at the bilateral big toe. There is no extinction with double simultaneous stimulation. There is no sensory dermatomal level identified. Motor: Strength is 5/5 in the bilateral upper and lower extremities.        Movement examination: Tone: There is mild increased tone in the right upper extremity.  The right upper extremity is mildly dystonic.  Otherwise, tone is normal. Abnormal movements: No dyskinesia today Coordination:  There is mild decremation with RAM's, with finger taps on the right Gait and Station: Not tested today due to blood pressure being very low and patient felt nauseated at beginning of exam.  Last visit we tried him on several walkers, but ultimately he does festinate and has freezing and drags the right leg.   I have reviewed and interpreted the following labs independently    Chemistry      Component Value Date/Time   NA 135  01/03/2024 1422   K 4.2 01/03/2024 1422   CL 102 01/03/2024 1422   CO2 22 01/03/2024 1422   BUN 30 (H) 01/03/2024 1422   CREATININE 0.98 01/03/2024 1422      Component Value Date/Time   CALCIUM  8.2 (L) 01/03/2024 1422   ALKPHOS 58 01/03/2024 1422   AST 22 01/03/2024 1422   ALT 10 01/03/2024 1422   BILITOT 0.6 01/03/2024 1422       Lab Results  Component Value Date   WBC 11.9 (H) 01/03/2024   HGB 13.8 01/03/2024   HCT 40.5 01/03/2024   MCV 92.5 01/03/2024   PLT 261 01/03/2024    Lab Results  Component Value Date   TSH 1.255 09/15/2023     Total time spent on today's visit was *** minutes, including both face-to-face time and nonface-to-face time.  Time included that spent on review of records (prior notes available to me/labs/imaging if pertinent), discussing treatment and goals, answering patient's questions and coordinating care.  It did not include the DBS time.  Cc:  Beverley Louann DASEN, MD  "

## 2024-03-01 NOTE — Telephone Encounter (Signed)
 RN received a call from pt wife, [name], who reports that pt had a rough night with more hallucinations and disturbances while sleeping. Rn discussed with Levon, NP, who also routed in Dr.Vaslow, and sent in Seroquel  for the pt to take.  RN provided education to Bicknell, verbalized understanding, no further needs at this time.

## 2024-03-02 ENCOUNTER — Encounter: Payer: Self-pay | Admitting: Neurology

## 2024-03-02 ENCOUNTER — Encounter: Admitting: Neurology

## 2024-03-03 ENCOUNTER — Emergency Department (HOSPITAL_COMMUNITY)

## 2024-03-03 ENCOUNTER — Other Ambulatory Visit: Payer: Self-pay

## 2024-03-03 ENCOUNTER — Encounter (HOSPITAL_COMMUNITY): Payer: Self-pay

## 2024-03-03 ENCOUNTER — Inpatient Hospital Stay (HOSPITAL_COMMUNITY): Admission: EM | Admit: 2024-03-03 | Source: Home / Self Care | Attending: Family Medicine | Admitting: Family Medicine

## 2024-03-03 DIAGNOSIS — E43 Unspecified severe protein-calorie malnutrition: Secondary | ICD-10-CM | POA: Insufficient documentation

## 2024-03-03 DIAGNOSIS — R7989 Other specified abnormal findings of blood chemistry: Secondary | ICD-10-CM

## 2024-03-03 DIAGNOSIS — G20A1 Parkinson's disease without dyskinesia, without mention of fluctuations: Secondary | ICD-10-CM | POA: Diagnosis present

## 2024-03-03 DIAGNOSIS — G9340 Encephalopathy, unspecified: Principal | ICD-10-CM

## 2024-03-03 DIAGNOSIS — N390 Urinary tract infection, site not specified: Secondary | ICD-10-CM

## 2024-03-03 DIAGNOSIS — G9341 Metabolic encephalopathy: Secondary | ICD-10-CM | POA: Diagnosis present

## 2024-03-03 DIAGNOSIS — R451 Restlessness and agitation: Secondary | ICD-10-CM

## 2024-03-03 DIAGNOSIS — C8339 Primary central nervous system lymphoma: Secondary | ICD-10-CM | POA: Diagnosis present

## 2024-03-03 DIAGNOSIS — A419 Sepsis, unspecified organism: Secondary | ICD-10-CM | POA: Diagnosis present

## 2024-03-03 DIAGNOSIS — I4891 Unspecified atrial fibrillation: Secondary | ICD-10-CM

## 2024-03-03 DIAGNOSIS — R69 Illness, unspecified: Secondary | ICD-10-CM

## 2024-03-03 LAB — CBC WITH DIFFERENTIAL/PLATELET
Abs Immature Granulocytes: 0.18 10*3/uL — ABNORMAL HIGH (ref 0.00–0.07)
Basophils Absolute: 0.1 10*3/uL (ref 0.0–0.1)
Basophils Relative: 0 %
Eosinophils Absolute: 0.1 10*3/uL (ref 0.0–0.5)
Eosinophils Relative: 1 %
HCT: 41.2 % (ref 39.0–52.0)
Hemoglobin: 13.2 g/dL (ref 13.0–17.0)
Immature Granulocytes: 1 %
Lymphocytes Relative: 5 %
Lymphs Abs: 0.8 10*3/uL (ref 0.7–4.0)
MCH: 31.4 pg (ref 26.0–34.0)
MCHC: 32 g/dL (ref 30.0–36.0)
MCV: 97.9 fL (ref 80.0–100.0)
Monocytes Absolute: 1 10*3/uL (ref 0.1–1.0)
Monocytes Relative: 7 %
Neutro Abs: 13.2 10*3/uL — ABNORMAL HIGH (ref 1.7–7.7)
Neutrophils Relative %: 86 %
Platelets: 345 10*3/uL (ref 150–400)
RBC: 4.21 MIL/uL — ABNORMAL LOW (ref 4.22–5.81)
RDW: 13.1 % (ref 11.5–15.5)
WBC: 15.3 10*3/uL — ABNORMAL HIGH (ref 4.0–10.5)
nRBC: 0 % (ref 0.0–0.2)

## 2024-03-03 LAB — BLOOD GAS, VENOUS
Acid-Base Excess: 1.5 mmol/L (ref 0.0–2.0)
Bicarbonate: 27.2 mmol/L (ref 20.0–28.0)
O2 Saturation: 45 %
Patient temperature: 37
pCO2, Ven: 46 mmHg (ref 44–60)
pH, Ven: 7.38 (ref 7.25–7.43)
pO2, Ven: <31 mmHg — CL (ref 32–45)

## 2024-03-03 LAB — TROPONIN T, HIGH SENSITIVITY
Troponin T High Sensitivity: 33 ng/L — ABNORMAL HIGH (ref 0–19)
Troponin T High Sensitivity: 33 ng/L — ABNORMAL HIGH (ref 0–19)

## 2024-03-03 LAB — COMPREHENSIVE METABOLIC PANEL WITH GFR
ALT: <5 U/L (ref 0–44)
AST: 29 U/L (ref 15–41)
Albumin: 3.4 g/dL — ABNORMAL LOW (ref 3.5–5.0)
Alkaline Phosphatase: 80 U/L (ref 38–126)
Anion gap: 12 (ref 5–15)
BUN: 28 mg/dL — ABNORMAL HIGH (ref 8–23)
CO2: 26 mmol/L (ref 22–32)
Calcium: 9.3 mg/dL (ref 8.9–10.3)
Chloride: 106 mmol/L (ref 98–111)
Creatinine, Ser: 1.19 mg/dL (ref 0.61–1.24)
GFR, Estimated: >60 mL/min
Glucose, Bld: 118 mg/dL — ABNORMAL HIGH (ref 70–99)
Potassium: 4 mmol/L (ref 3.5–5.1)
Sodium: 143 mmol/L (ref 135–145)
Total Bilirubin: 0.7 mg/dL (ref 0.0–1.2)
Total Protein: 6.3 g/dL — ABNORMAL LOW (ref 6.5–8.1)

## 2024-03-03 LAB — URINALYSIS, ROUTINE W REFLEX MICROSCOPIC
Bacteria, UA: NONE SEEN
Bilirubin Urine: NEGATIVE
Glucose, UA: NEGATIVE mg/dL
Ketones, ur: 20 mg/dL — AB
Nitrite: NEGATIVE
Protein, ur: 30 mg/dL — AB
Specific Gravity, Urine: 1.023 (ref 1.005–1.030)
WBC, UA: >50 WBC/hpf (ref 0–5)
pH: 5 (ref 5.0–8.0)

## 2024-03-03 LAB — CBG MONITORING, ED: Glucose-Capillary: 95 mg/dL (ref 70–99)

## 2024-03-03 LAB — MAGNESIUM: Magnesium: 2.3 mg/dL (ref 1.7–2.4)

## 2024-03-03 LAB — LIPASE, BLOOD: Lipase: 22 U/L (ref 11–51)

## 2024-03-03 MED ORDER — LACTATED RINGERS IV BOLUS
500.0000 mL | Freq: Once | INTRAVENOUS | Status: AC
  Administered 2024-03-03: 500 mL via INTRAVENOUS

## 2024-03-03 NOTE — H&P (Signed)
 " History and Physical    [name] [name] FMW:[id number] DOB: 08-16-1945 DOA: 03/03/2024  PCP: Beverley Louann ONEIDA, MD  Patient coming from: Home  Chief Complaint: AMS  HPI: [name] [name] is a 79 y.o. male with medical history significant of primary CNS lymphoma status post WBRT, seizures, Parkinson's disease, orthostatic hypotension, BPH, depression, GERD.  Patient was recently seen by palliative care on 1/22 for increased confusion/hallucinations and impaired mobility.  His symptoms were attributed to a urinary tract infection and was treated with a 10-day course of Keflex .  Also steroids were being tapered off by Dr. [name].  He was recently started on Seroquel  for hallucinations and sleep disturbance.  Patient presents to the ED today from home for evaluation of altered mental status/confusion/agitation.  Triage note mentions that patient had a seizure on arrival to the ED but I was informed by ED physician that patient did not have any obvious seizure-like activity in the ED.  Afebrile.  Labs notable for WBC count 15.3, glucose 118, BUN 28, creatinine 1.19, normal lipase and LFTs, troponin 33> 33, VBG with pH 7.38 and pCO2 46, magnesium  2.3.  UA with negative nitrite, large leukocytes, 21-50 RBCs, >50 WBCs, and no bacteria.  Urine culture in process.  Blood cultures in process.  Chest x-ray showing no acute cardiopulmonary abnormality.  CT head pending***  Patient was given 500 mL LR.  ED Course: Afebrile.  Patient was given  Review of Systems:  ROS  Past Medical History:  Diagnosis Date   Agent orange exposure    Anemia    BPH (benign prostatic hypertrophy)    Cancer (HCC)    basal cell temple   Complication of anesthesia    confusion and combative when waking up from last kidney procedure   Constipation    Depression    Fatigue    d/t Parkinson's   GERD (gastroesophageal reflux disease)    occasional   History of kidney stones    Left ureteral calculus    Lower urinary tract  symptoms (LUTS)    Nephrolithiasis    Neuromuscular disorder (HCC)     parkinson's   Orthostatic hypotension    d/t Parkinson's disease   Orthostatic hypotension    Parkinson's disease (HCC)    Peyronie disease    Pneumonia 2018   Renal cyst, left    S/P deep brain stimulator placement    11-29-2012   Seizure (HCC)    pt and wife report that he has had seizures and is now on seizure medicine   Sleep behavior disorder, REM    Wears glasses    Wears hearing aid    BILATERAL-- INTERMITTANT WEARS    Past Surgical History:  Procedure Laterality Date   APPLICATION OF CRANIAL NAVIGATION Left 10/11/2023   Procedure: COMPUTER-ASSISTED NAVIGATION, FOR CRANIAL PROCEDURE;  Surgeon: Lanis Pupa, MD;  Location: [facility];  Service: Neurosurgery;  Laterality: Left;   CATARACT EXTRACTION Bilateral    CHOLECYSTECTOMY  12/21/2014   Procedure: LAPAROSCOPIC CHOLECYSTECTOMY;  Surgeon: Lynda Leos, MD;  Location: [facility] OR;  Service: General;;   COLONOSCOPY     CYSTO/  LEFT URETEROSOCPY/   LEFT URETERAL STENT PLACEMENT/  LASER BLADDER STONES AND EXTRACTION  09/25/2010   CYSTOSCOPY WITH INSERTION OF UROLIFT N/A 03/11/2017   Procedure: CYSTOSCOPY WITH INSERTION OF UROLIFT, LITHOPAXY,STONE OBTAINED;  Surgeon: Matilda Senior, MD;  Location: [facility];  Service: Urology;  Laterality: N/A;   CYSTOSCOPY WITH LITHOLAPAXY N/A 09/12/2020   Procedure: CYSTOSCOPY WITH  LITHOLAPAXY, LEFT RETROGRADE PYELOGRAMS, LEFT URETEROSCOPY, AND STENT PLACEMENT;  Surgeon: Cam Morene ORN, MD;  Location: [facility];  Service: Urology;  Laterality: N/A;  REQUESTING 2 HRS   CYSTOSCOPY WITH LITHOLAPAXY Right 07/14/2023   Procedure: CYSTOSCOPY, WITH BLADDER CALCULUS LITHOLAPAXY;  Surgeon: Cam Morene ORN, MD;  Location: [facility];  Service: Urology;  Laterality: Right;  CYSTOLITHOLAPAXY, RIGHT URETEROSCOPY, LASER LITHOTRIPSY, STONE EXTRACTION, RIGHT URETERAL STENT PLACEMENT   CYSTOSCOPY WITH  RETROGRADE PYELOGRAM, URETEROSCOPY AND STENT PLACEMENT Left 01/06/2013   Procedure: CYSTOSCOPY WITH RETROGRADE PYELOGRAM, URETEROSCOPY AND STENT PLACEMENT;  Surgeon: Thomasine Oiler, MD;  Location: [facility];  Service: Urology;  Laterality: Left;   CYSTOSCOPY WITH RETROGRADE PYELOGRAM, URETEROSCOPY AND STENT PLACEMENT Left 09/11/2014   Procedure: CYSTOSCOPY WITH LEFT RETROGRADE PYELOGRAM, URETEROSCOPY AND STENT PLACEMENT;  Surgeon: Oliva Oiler, MD;  Location: [facility];  Service: Urology;  Laterality: Left;   CYSTOSCOPY/RETROGRADE/URETEROSCOPY/STONE EXTRACTION WITH BASKET Right 07/14/2023   Procedure: CYSTOSCOPY, WITH CALCULUS REMOVAL USING BASKET;  Surgeon: Cam Morene ORN, MD;  Location: [facility];  Service: Urology;  Laterality: Right;   CYSTOSCOPY/URETEROSCOPY/HOLMIUM LASER/STENT PLACEMENT Right 07/14/2023   Procedure: CYSTOSCOPY/URETEROSCOPY/HOLMIUM LASER/STENT PLACEMENT;  Surgeon: Cam Morene ORN, MD;  Location: [facility];  Service: Urology;  Laterality: Right;   DEEP BRAIN STIMULATOR PLACEMENT  11/29/2012   genertor device at left pectoral area-   EXTRACORPOREAL SHOCK WAVE LITHOTRIPSY Left 10-11-2010  &  01-05-2011   HOLMIUM LASER APPLICATION Left 01/06/2013   Procedure: HOLMIUM LASER APPLICATION;  Surgeon: Thomasine Oiler, MD;  Location: [facility];  Service: Urology;  Laterality: Left;   HOLMIUM LASER APPLICATION Left 09/11/2014   Procedure: HOLMIUM LASER APPLICATION;  Surgeon: Oliva Oiler, MD;  Location: [facility];  Service: Urology;  Laterality: Left;   HOLMIUM LASER APPLICATION N/A 03/11/2017   Procedure: HOLMIUM LASER APPLICATION;  Surgeon: Matilda Senior, MD;  Location: [facility];  Service: Urology;  Laterality: N/A;   NEGATIVE SLEEP STUDY  2013  per pt   NEPHROLITHOTOMY Right 08/13/2022   Procedure: RIGHT NEPHROLITHOTOMY PERCUTANEOUS;  Surgeon: Cam Morene ORN, MD;  Location: [facility];  Service:  Urology;  Laterality: Right;  210 MINUTES   PR DURAL GRAFT SPINAL Left 10/11/2023   Procedure: FRAMELESS STEREOTACTIC BIOPSY;  Surgeon: Lanis Pupa, MD;  Location: [facility];  Service: Neurosurgery;  Laterality: Left;  STEREOTACTIC LEFT BRAIN BX   SP PERC NEPHROSTOMY Left 12/16/2012   STONE EXTRACTION WITH BASKET Left 01/06/2013   Procedure: STONE EXTRACTION WITH BASKET;  Surgeon: Thomasine Oiler, MD;  Location: [facility] [place];  Service: Urology;  Laterality: Left;   TONSILLECTOMY     TRANSURETHRAL RESECTION OF PROSTATE N/A 09/12/2020   Procedure: TRANSURETHRAL RESECTION OF THE PROSTATE (TURP);  Surgeon: Cam Morene ORN, MD;  Location: [facility];  Service: Urology;  Laterality: N/A;     reports that he has never smoked. He has never used smokeless tobacco. He reports that he does not currently use alcohol . He reports that he does not use drugs.  Allergies[1]  Family History  Problem Relation Age of Onset   Hypertension Mother    Dementia Father    Cancer Father    Heart attack Sister    Arthritis/Rheumatoid Sister     Prior to Admission medications  Medication Sig Start Date End Date Taking? Authorizing Provider  carbidopa -levodopa  (SINEMET  CR) 50-200 MG tablet Take 1 tablet by mouth at bedtime.    [provider]  carbidopa -levodopa  (SINEMET   IR) 25-100 MG tablet 2-2.5 tablets 4 times per day at 6am/10:30am/2pm/6pm 09/20/23   Vicci Afton CROME, MD  cyanocobalamin  1000 MCG tablet Take 1 tablet (1,000 mcg total) by mouth daily. 07/11/23   Rizwan, Saima, MD  fluticasone  (FLONASE ) 50 MCG/ACT nasal spray Place 2 sprays into both nostrils daily. Patient not taking: Reported on 12/14/2023 10/26/23   Leath-Warren, Etta PARAS, NP  lamoTRIgine  (LAMICTAL ) 100 MG tablet Take 1 tablet (100 mg total) by mouth 2 (two) times daily. 01/03/24   Vaslow, Zachary K, MD  midodrine  (PROAMATINE ) 5 MG tablet Take 1 tablet (5 mg total) by mouth 3 (three) times daily with  meals. Patient not taking: Reported on 01/03/2024 08/30/23   Tat, Asberry RAMAN, DO  ondansetron  (ZOFRAN -ODT) 8 MG disintegrating tablet Take 1 tablet (8 mg total) by mouth every 8 (eight) hours as needed for nausea or vomiting. Patient not taking: Reported on 01/03/2024 12/14/23   Izell Domino, MD  pantoprazole  (PROTONIX ) 40 MG tablet TAKE 1 TABLET BY MOUTH EVERY DAY 11/22/23   Vaslow, Zachary K, MD  QUEtiapine  (SEROQUEL ) 25 MG tablet Take 1 tablet (25 mg total) by mouth at bedtime. 03/01/24   Pickenpack-Cousar, Athena N, NP  traZODone  (DESYREL ) 50 MG tablet TAKE 1 TABLET BY MOUTH EVERYDAY AT BEDTIME Patient not taking: Reported on 12/14/2023 12/13/23   Vaslow, Zachary K, MD  traZODone  (DESYREL ) 50 MG tablet Take 2 tablets (100 mg total) by mouth at bedtime. 12/11/23   Geofm Delon BRAVO, NP    Physical Exam: Vitals:   03/03/24 1635 03/03/24 1640 03/03/24 1950 03/03/24 2100  BP:   91/63 115/66  Pulse: (!) 103  96 (!) 101  Resp: (!) 26  16 19   Temp:  98.3 F (36.8 C) 98.2 F (36.8 C)   TempSrc:  Oral Axillary   SpO2: 95%  96% 95%    Physical Exam  Labs on Admission: I have personally reviewed following labs and imaging studies  CBC: Recent Labs  Lab 03/03/24 1630  WBC 15.3*  NEUTROABS 13.2*  HGB 13.2  HCT 41.2  MCV 97.9  PLT 345   Basic Metabolic Panel: Recent Labs  Lab 03/03/24 1630  NA 143  K 4.0  CL 106  CO2 26  GLUCOSE 118*  BUN 28*  CREATININE 1.19  CALCIUM  9.3  MG 2.3   GFR: CrCl cannot be calculated (Unknown ideal weight.). Liver Function Tests: Recent Labs  Lab 03/03/24 1630  AST 29  ALT <5  ALKPHOS 80  BILITOT 0.7  PROT 6.3*  ALBUMIN 3.4*   Recent Labs  Lab 03/03/24 1630  LIPASE 22   No results for input(s): AMMONIA in the last 168 hours. Coagulation Profile: No results for input(s): INR, PROTIME in the last 168 hours. Cardiac Enzymes: No results for input(s): CKTOTAL, CKMB, CKMBINDEX, TROPONINI in the last 168 hours. BNP (last  3 results) No results for input(s): PROBNP in the last 8760 hours. HbA1C: No results for input(s): HGBA1C in the last 72 hours. CBG: Recent Labs  Lab 03/03/24 1935  GLUCAP 95   Lipid Profile: No results for input(s): CHOL, HDL, LDLCALC, TRIG, CHOLHDL, LDLDIRECT in the last 72 hours. Thyroid  Function Tests: No results for input(s): TSH, T4TOTAL, FREET4, T3FREE, THYROIDAB in the last 72 hours. Anemia Panel: No results for input(s): VITAMINB12, FOLATE, FERRITIN, TIBC, IRON, RETICCTPCT in the last 72 hours. Urine analysis:    Component Value Date/Time   COLORURINE YELLOW 09/15/2023 1032   APPEARANCEUR HAZY (A) 09/15/2023 1032   LABSPEC 1.015  09/15/2023 1032   PHURINE 7.5 09/15/2023 1032   GLUCOSEU 100 (A) 09/15/2023 1032   HGBUR NEGATIVE 09/15/2023 1032   BILIRUBINUR NEGATIVE 09/15/2023 1032   KETONESUR NEGATIVE 09/15/2023 1032   PROTEINUR NEGATIVE 09/15/2023 1032   UROBILINOGEN 0.2 12/15/2012 1201   NITRITE NEGATIVE 09/15/2023 1032   LEUKOCYTESUR NEGATIVE 09/15/2023 1032    Radiological Exams on Admission: DG Chest Port 1 View Result Date: 03/03/2024 EXAM: 1 VIEW(S) XRAY OF THE CHEST 03/03/2024 04:54:00 PM COMPARISON: 09/15/2023 CLINICAL HISTORY: Multiple sclerosis. FINDINGS: LINES, TUBES AND DEVICES: Bilateral battery packs over upper chest. Cardiac leads noted. LUNGS AND PLEURA: Low lung volumes. Left basilar atelectasis. No focal pulmonary opacity. No pleural effusion. No pneumothorax. HEART AND MEDIASTINUM: Aortic atherosclerosis. No acute abnormality of the cardiac and mediastinal silhouettes. BONES AND SOFT TISSUES: Thoracic spondylosis. IMPRESSION: 1. No acute cardiopulmonary abnormality. Electronically signed by: Norman Gatlin MD 03/03/2024 05:09 PM EST RP Workstation: [id number]    EKG: Independently reviewed.  Sinus tachycardia, artifact.  Assessment and Plan  DVT prophylaxis: {Blank single:19197::Lovenox ,SQ Heparin ,IV  heparin  gtt,Xarelto,Eliquis,Coumadin,SCDs,***} Code Status: {Blank single:19197::Full Code,DNR,DNR/DNI,Comfort Care,***} Family Communication: ***  Consults called: ***  Level of care: {Blank single:19197::Med-Surg,Telemetry bed,Progressive Care Unit,Step Down Unit} Admission status: *** Time Spent: 75+ minutes***  Editha Ram MD [facility]  If 7PM-7AM, please contact night-coverage [email or web address]  03/03/2024, 11:11 PM       [1]  Allergies Allergen Reactions   Alfuzosin  Other (See Comments)    Hypotension    Rivastigmine Tartrate Nausea And Vomiting     Can tolerate the patch but can not tolerate the pills   Tamsulosin Other (See Comments)    Severe joint pain   "

## 2024-03-03 NOTE — ED Triage Notes (Signed)
 Pt BIB wife from Home, wife prompt by doctor to bring pt to hospital due to AMS and agitation. On arrival pt had seizure, pt bring directly back to room. Pt is confused at baseline however pt AMS has increased over the past few days. Pt has parkinson non-verbal, non ambulatory. Hx of brain cancer, seizures

## 2024-03-03 NOTE — ED Provider Notes (Signed)
 " [place] EMERGENCY DEPARTMENT AT [facility] Provider Note   CSN: [id number] Arrival date & time: 03/03/24  1552     Patient presents with: Seizures and Altered Mental Status   [name] [name] is a 79 y.o. male.   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 it is noted in the 1\22\2026 note from palliative medicine that the patient is in a wheelchair and speaks in a whisper.  There is notation of some confusion and hallucinations occurring at baseline more recently.  Noted that patient can assist in some ADLs but limited due to cognitive decline and fatigue.  Current baseline is unable to stand unassisted but can get to standing position for transfers with assistance.  Patient takes Lamictal  twice daily for seizures.  Patient recently been treated with a 10-day course of Keflex  for UTI identified on a clean-catch urine test at urology.  Patient's wife reports he just has 2 more doses.  Patient's wife reports that he had had catheters previously but ended up pulling them out and they did not work.  Now, he wears a diaper.  Nonetheless, she reports he has been more agitated for about 3 days.  The confusion seemed worse for about 10 days but the past 2 to 3 days have been worse.  He has started to get out of bed and they cannot keep him from working his way onto the floor and then he crawls around and has got a lot of rug burns.  She reports that his appetite has really decreased in the past couple days and he only probably drank about 4 ounces of water  in the past 24 hours.  She reports she is concerned that he is getting dehydrated.  She has not documented any fever but reports that once when he was septic, he also had confusion.  She reports that he has been treated with radiation therapy for the CNS lymphoma and is being weaned off of steroids.  She reports has been inconclusive whether or not the steroids were either masking the  symptoms or possibly making them worse.    Prior to Admission medications  Medication Sig Start Date End Date Taking? Authorizing Provider  carbidopa -levodopa  (SINEMET  CR) 50-200 MG tablet Take 1 tablet by mouth at bedtime.    [provider]  carbidopa -levodopa  (SINEMET  IR) 25-100 MG tablet 2-2.5 tablets 4 times per day at 6am/10:30am/2pm/6pm 09/20/23   Vicci Afton CROME, MD  cyanocobalamin  1000 MCG tablet Take 1 tablet (1,000 mcg total) by mouth daily. 07/11/23   Rizwan, Saima, MD  fluticasone  (FLONASE ) 50 MCG/ACT nasal spray Place 2 sprays into both nostrils daily. Patient not taking: Reported on 12/14/2023 10/26/23   Leath-Warren, Etta PARAS, NP  lamoTRIgine  (LAMICTAL ) 100 MG tablet Take 1 tablet (100 mg total) by mouth 2 (two) times daily. 01/03/24   Vaslow, Zachary K, MD  midodrine  (PROAMATINE ) 5 MG tablet Take 1 tablet (5 mg total) by mouth 3 (three) times daily with meals. Patient not taking: Reported on 01/03/2024 08/30/23   Tat, Asberry RAMAN, DO  ondansetron  (ZOFRAN -ODT) 8 MG disintegrating tablet Take 1 tablet (8 mg total) by mouth every 8 (eight) hours as needed for nausea or vomiting. Patient not taking: Reported on 01/03/2024 12/14/23   Izell Domino, MD  pantoprazole  (PROTONIX ) 40 MG tablet TAKE 1 TABLET BY MOUTH EVERY DAY 11/22/23   Vaslow, Zachary K, MD  QUEtiapine  (SEROQUEL ) 25 MG tablet Take 1 tablet (  25 mg total) by mouth at bedtime. 03/01/24   Pickenpack-Cousar, Athena N, NP  traZODone  (DESYREL ) 50 MG tablet TAKE 1 TABLET BY MOUTH EVERYDAY AT BEDTIME Patient not taking: Reported on 12/14/2023 12/13/23   Vaslow, Zachary K, MD  traZODone  (DESYREL ) 50 MG tablet Take 2 tablets (100 mg total) by mouth at bedtime. 12/11/23   Geofm Delon BRAVO, NP    Allergies: Alfuzosin , Rivastigmine tartrate, and Tamsulosin    Review of Systems  Updated Vital Signs BP 115/66 (BP Location: Right Arm)   Pulse (!) 101   Temp 98.2 F (36.8 C) (Axillary)   Resp 19   SpO2 95%   Physical  Exam Constitutional:      Comments: Patient is debilitated in appearance.  He is cachectic.  He is not exhibiting respiratory distress.  He is disheveled.  HENT:     Head: Normocephalic and atraumatic.     Mouth/Throat:     Mouth: Mucous membranes are dry.     Pharynx: Oropharynx is clear.  Cardiovascular:     Rate and Rhythm: Normal rate and regular rhythm.  Pulmonary:     Effort: Pulmonary effort is normal.     Breath sounds: Normal breath sounds.  Abdominal:     Comments: Abdomen is nondistended.  Patient is not Dors any tenderness to palpation.  Musculoskeletal:     Comments: Extremities are fairly cachectic with muscular atrophy.  No apparent deformity.  Many superficial abrasions.  Skin:    Comments: Skin is slightly pale.  There are numerous superficial abrasions consistent with rug burns that the patient's wife describes.  Neurological:     Comments: Patient is awake.  He is not exhibiting any active seizure activity.  Very difficult to understand.  Sometimes he can respond in a whispering voice but is fairly unintelligible.  Patient is mostly staying somewhat fetal position with knees bent and arms bent.     (all labs ordered are listed, but only abnormal results are displayed) Labs Reviewed  COMPREHENSIVE METABOLIC PANEL WITH GFR - Abnormal; Notable for the following components:      Result Value   Glucose, Bld 118 (*)    BUN 28 (*)    Total Protein 6.3 (*)    Albumin 3.4 (*)    All other components within normal limits  CBC WITH DIFFERENTIAL/PLATELET - Abnormal; Notable for the following components:   WBC 15.3 (*)    RBC 4.21 (*)    Neutro Abs 13.2 (*)    Abs Immature Granulocytes 0.18 (*)    All other components within normal limits  BLOOD GAS, VENOUS - Abnormal; Notable for the following components:   pO2, Ven <31 (*)    All other components within normal limits  TROPONIN T, HIGH SENSITIVITY - Abnormal; Notable for the following components:   Troponin T High  Sensitivity 33 (*)    All other components within normal limits  TROPONIN T, HIGH SENSITIVITY - Abnormal; Notable for the following components:   Troponin T High Sensitivity 33 (*)    All other components within normal limits  URINE CULTURE  CULTURE, BLOOD (ROUTINE X 2)  CULTURE, BLOOD (ROUTINE X 2)  LIPASE, BLOOD  MAGNESIUM   URINALYSIS, ROUTINE W REFLEX MICROSCOPIC  CBG MONITORING, ED    EKG: EKG Interpretation Date/Time:  Friday March 03 2024 16:02:47 EST Ventricular Rate:  101 PR Interval:  139 QRS Duration:  102 QT Interval:  337 QTC Calculation: 437 R Axis:   -33  Text Interpretation: Sinus tachycardia  no sig chnage from previous Confirmed by Armenta Canning [phone]) on 03/03/2024 11:10:05 PM  Radiology: [facility] Chest Port 1 View Result Date: 03/03/2024 EXAM: 1 VIEW(S) XRAY OF THE CHEST 03/03/2024 04:54:00 PM COMPARISON: 09/15/2023 CLINICAL HISTORY: Multiple sclerosis. FINDINGS: LINES, TUBES AND DEVICES: Bilateral battery packs over upper chest. Cardiac leads noted. LUNGS AND PLEURA: Low lung volumes. Left basilar atelectasis. No focal pulmonary opacity. No pleural effusion. No pneumothorax. HEART AND MEDIASTINUM: Aortic atherosclerosis. No acute abnormality of the cardiac and mediastinal silhouettes. BONES AND SOFT TISSUES: Thoracic spondylosis. IMPRESSION: 1. No acute cardiopulmonary abnormality. Electronically signed by: Norman Gatlin MD 03/03/2024 05:09 PM EST RP Workstation: [id number]     Procedures   Medications Ordered in the ED  lactated ringers  bolus 500 mL (500 mLs Intravenous New Bag/Given 03/03/24 2301)                                    Medical Decision Making Amount and/or Complexity of Data Reviewed Labs: ordered. Radiology: ordered.   Patient presents as outlined.  He has complex medical history with combined Parkinson's and a CNS lymphoma.  Is difficult to assess patient's baseline.  Patient's wife is present and describes more rapidly deteriorating  mental status, confusion and agitation.  She has concern for possible sepsis or UTI.  He is currently just finishing treatment for UTI with Keflex .  Troponin 33 blood gas pCO2 46 pH 7.3 magnesium  lipase normal GFR greater than 60 white count 15.3  UA pending  Chest x-ray interpreted radiology no acute findings.  CT head radiology read pending.  I have personally examined and do not appreciate any acute bleed or acute appearing change.  Pending radiology read.  At this time patient presents with encephalopathic appearance poor oral intake and multiple potential comorbid conditions exacerbating mental status deterioration.  Patient is recently tapering off of steroids.  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  Patient also has known Parkinson's and some equivocal form of CNS lymphoma.  Reviewing EMR indicates it is a CNS lymphoma but MRI was showing may be stable or possibly improved appearance.  I gather this is still in the process of being managed and definitively diagnosed by Dr. [name].  Patient's wife reports the plan was to taper off the steroids and then reassess MRI to see if it unmask some more pronounced appearance of the lymphoma.  Given constellation of symptoms with decreased oral intake and very limited fluid intake, increasing agitation that may be steroid either induced or from withdrawal of steroids we will plan for observation admission.  Will begin some rehydration with Lact ringer.  Consult: Dr. Alfornia for admission.  Requests she be messaged with CT head results.  I have personally reviewed and did not see acute appearance.       Final diagnoses:  Encephalopathy, unspecified type  Agitation  Severe comorbid illness    ED Discharge Orders     None          Armenta Canning, MD 03/03/24 2328  "

## 2024-03-04 DIAGNOSIS — R7989 Other specified abnormal findings of blood chemistry: Secondary | ICD-10-CM

## 2024-03-04 DIAGNOSIS — N390 Urinary tract infection, site not specified: Secondary | ICD-10-CM

## 2024-03-04 LAB — BLOOD CULTURE ID PANEL (REFLEXED) - BCID2

## 2024-03-04 LAB — CBC
HCT: 38.9 % — ABNORMAL LOW (ref 39.0–52.0)
Hemoglobin: 12.1 g/dL — ABNORMAL LOW (ref 13.0–17.0)
MCH: 31.2 pg (ref 26.0–34.0)
MCHC: 31.1 g/dL (ref 30.0–36.0)
MCV: 100.3 fL — ABNORMAL HIGH (ref 80.0–100.0)
Platelets: 321 10*3/uL (ref 150–400)
RBC: 3.88 MIL/uL — ABNORMAL LOW (ref 4.22–5.81)
RDW: 13.2 % (ref 11.5–15.5)
WBC: 20.1 10*3/uL — ABNORMAL HIGH (ref 4.0–10.5)
nRBC: 0 % (ref 0.0–0.2)

## 2024-03-04 LAB — AMMONIA: Ammonia: 33 umol/L (ref 9–35)

## 2024-03-04 LAB — VITAMIN B12: Vitamin B-12: 1052 pg/mL — ABNORMAL HIGH (ref 180–914)

## 2024-03-04 LAB — TSH: TSH: 0.809 u[IU]/mL (ref 0.350–4.500)

## 2024-03-04 MED ORDER — SODIUM CHLORIDE 0.9 % IV SOLN
2.0000 g | INTRAVENOUS | Status: DC
  Administered 2024-03-05: 2 g via INTRAVENOUS
  Filled 2024-03-04: qty 20

## 2024-03-04 MED ORDER — SODIUM CHLORIDE 0.9 % IV SOLN
2.0000 g | Freq: Once | INTRAVENOUS | Status: AC
  Administered 2024-03-04: 2 g via INTRAVENOUS
  Filled 2024-03-04: qty 20

## 2024-03-04 MED ORDER — LORAZEPAM 2 MG/ML IJ SOLN
0.5000 mg | Freq: Three times a day (TID) | INTRAMUSCULAR | Status: DC | PRN
  Administered 2024-03-04 – 2024-03-07 (×6): 0.5 mg via INTRAVENOUS
  Filled 2024-03-04 (×6): qty 1

## 2024-03-04 MED ORDER — LAMOTRIGINE 100 MG PO TABS
100.0000 mg | ORAL_TABLET | Freq: Two times a day (BID) | ORAL | Status: DC
  Administered 2024-03-04 – 2024-03-07 (×8): 100 mg via ORAL
  Filled 2024-03-04 (×9): qty 1

## 2024-03-04 MED ORDER — CARBIDOPA-LEVODOPA ER 50-200 MG PO TBCR
1.0000 | EXTENDED_RELEASE_TABLET | Freq: Every day | ORAL | Status: DC
  Administered 2024-03-04 – 2024-03-08 (×5): 1 via ORAL
  Filled 2024-03-04 (×6): qty 1

## 2024-03-04 MED ORDER — ACETAMINOPHEN 650 MG RE SUPP: 650.0000 mg | Freq: Four times a day (QID) | RECTAL | Status: DC | PRN

## 2024-03-04 MED ORDER — SODIUM CHLORIDE 0.9 % IV SOLN: 1.0000 g | INTRAVENOUS | Status: DC

## 2024-03-04 MED ORDER — ACETAMINOPHEN 325 MG PO TABS
650.0000 mg | ORAL_TABLET | Freq: Four times a day (QID) | ORAL | Status: DC | PRN
  Administered 2024-03-05 – 2024-03-07 (×2): 650 mg via ORAL
  Filled 2024-03-04 (×2): qty 2

## 2024-03-04 MED ORDER — ENSURE PLUS HIGH PROTEIN PO LIQD
237.0000 mL | Freq: Two times a day (BID) | ORAL | Status: DC
  Administered 2024-03-04 – 2024-03-05 (×3): 237 mL via ORAL

## 2024-03-04 MED ORDER — SODIUM CHLORIDE 0.9 % IV SOLN: INTRAVENOUS | Status: AC

## 2024-03-04 MED ORDER — CARBIDOPA-LEVODOPA 25-100 MG PO TABS
2.0000 | ORAL_TABLET | Freq: Four times a day (QID) | ORAL | Status: DC
  Administered 2024-03-04 – 2024-03-09 (×9): 2 via ORAL
  Filled 2024-03-04 (×18): qty 2

## 2024-03-04 MED ORDER — ENOXAPARIN SODIUM 40 MG/0.4ML IJ SOSY
40.0000 mg | PREFILLED_SYRINGE | INTRAMUSCULAR | Status: DC
  Administered 2024-03-05 – 2024-03-08 (×4): 40 mg via SUBCUTANEOUS
  Filled 2024-03-04 (×5): qty 0.4

## 2024-03-04 NOTE — Consult Note (Signed)
 WOC Nurse Consult Note: Reason for Consult: wounds  Wound type: 1.  Partial thickness L 2nd digit red moist  2.  Full thickness L leg scattered abrasions r/t trauma brown/red dry tissue  3.  Full thickness R lateral leg and knee red dry  4.  L hand partial thickness skin tears dry hemorrhagic tissue  5.  L elbow full thickness with dry brown tissue  Pressure Injury POA: NA  Measurement: see nursing flowsheet  Wound bed: as above  Drainage (amount, consistency, odor) dry  Periwound:  Dressing procedure/placement/frequency:  Paint L lower leg, L hand and L arm/elbow wounds with Betadine daily and allow to air dry. Cover with silicone foam or leave open to air whichever is preferred.  Cleanse R leg and L 2nd digit wounds with NS, apply Xeroform gauze (WD#[id number]/[employer] #[id number]) to wound beds daily and secure with silicone foam.  May lift foam daily to replace Xeroform. Change foam q3 days and prn soiling.   POC discussed with bedside nurse. WOC team will not follow. Reconsult if further needs arise.   Thank you,    Powell Bar MSN, RN-BC, [employer]

## 2024-03-04 NOTE — ED Notes (Signed)
Pt keeps removing tele leads.

## 2024-03-04 NOTE — Progress Notes (Signed)
 PHARMACY - PHYSICIAN COMMUNICATION CRITICAL VALUE ALERT - BLOOD CULTURE IDENTIFICATION (BCID)  [name] [name] is an 79 y.o. male who presented to [facility] on 03/03/2024 with acute metabolic encephalopathy likely due to UTI and dehydration.  Assessment:  BCID + Staph species (no methicillin resistance) in 1 out of 4 bottles - likely contaminant.  Micro lab also reports GNR however no organism has been identified to date.  Name of physician (or Provider) Contacted: A. Alto, NP  Current antibiotics: Ceftriaxone  2g IV q24h  Changes to prescribed antibiotics recommended:  Patient is on recommended antibiotics - No changes needed  Results for orders placed or performed during the hospital encounter of 03/03/24  Blood Culture ID Panel (Reflexed) (Collected: 03/03/2024 10:57 PM)  Result Value Ref Range   Enterococcus faecalis NOT DETECTED NOT DETECTED   Enterococcus Faecium NOT DETECTED NOT DETECTED   Listeria monocytogenes NOT DETECTED NOT DETECTED   Staphylococcus species DETECTED (A) NOT DETECTED   Staphylococcus aureus (BCID) NOT DETECTED NOT DETECTED   Staphylococcus epidermidis NOT DETECTED NOT DETECTED   Staphylococcus lugdunensis NOT DETECTED NOT DETECTED   Streptococcus species NOT DETECTED NOT DETECTED   Streptococcus agalactiae NOT DETECTED NOT DETECTED   Streptococcus pneumoniae NOT DETECTED NOT DETECTED   Streptococcus pyogenes NOT DETECTED NOT DETECTED   A.calcoaceticus-baumannii NOT DETECTED NOT DETECTED   Bacteroides fragilis NOT DETECTED NOT DETECTED   Enterobacterales NOT DETECTED NOT DETECTED   Enterobacter cloacae complex NOT DETECTED NOT DETECTED   Escherichia coli NOT DETECTED NOT DETECTED   Klebsiella aerogenes NOT DETECTED NOT DETECTED   Klebsiella oxytoca NOT DETECTED NOT DETECTED   Klebsiella pneumoniae NOT DETECTED NOT DETECTED   Proteus species NOT DETECTED NOT DETECTED   Salmonella species NOT DETECTED NOT DETECTED   Serratia marcescens NOT DETECTED NOT  DETECTED   Haemophilus influenzae NOT DETECTED NOT DETECTED   Neisseria meningitidis NOT DETECTED NOT DETECTED   Pseudomonas aeruginosa NOT DETECTED NOT DETECTED   Stenotrophomonas maltophilia NOT DETECTED NOT DETECTED   Candida albicans NOT DETECTED NOT DETECTED   Candida auris NOT DETECTED NOT DETECTED   Candida glabrata NOT DETECTED NOT DETECTED   Candida krusei NOT DETECTED NOT DETECTED   Candida parapsilosis NOT DETECTED NOT DETECTED   Candida tropicalis NOT DETECTED NOT DETECTED   Cryptococcus neoformans/gattii NOT DETECTED NOT DETECTED    Arvin Gauss, PharmD 03/04/2024  9:50 PM

## 2024-03-04 NOTE — Progress Notes (Signed)

## 2024-03-04 NOTE — Progress Notes (Signed)
 " PROGRESS NOTE    [name] [name]  FMW:[id number] DOB: 07/16/1945 DOA: 03/03/2024 PCP: Beverley Louann ONEIDA, MD    Brief Narrative:  This 79 y.o. male with PMH significant for primary CNS lymphoma status post WBRT, seizures, Parkinson's disease, orthostatic hypotension, BPH, depression, GERD.  He has recently seen  palliative care on 1/22 for increased confusion / hallucinations and impaired mobility.  His symptoms were attributed to urinary tract infection and he was prescribed a 10-day course of Keflex .  He was recently started on Seroquel  2 days ago for hallucinations and sleep disturbance.  Dr. Vaslow has recently tapered off his steroids.  Patient presented in the ED further evaluation of altered mental status / confusion / agitation.  Triage note mentions that patient had seizure on arrival in the ED.  Labs notable for WBC count 15.3, glucose 118, BUN 28, creatinine 1.19, normal lipase and LFTs, troponin 33> 33, UA with negative nitrite, large leukocytes, 21-50 RBCs, >50 WBCs, and no bacteria.  Urine culture blood cultures were obtained.  Chest x-ray no acute cardiopulmonary abnormality.  CT head without any acute intracranial finding.  Wife reports patient has multiple falls at home.  He sometimes crawling on the floor.  She also reports patient does have brief episodes where he starts staring and does not respond but there is no obvious tonic-clonic seizure-like activity reported.  Patient has been receiving Lamictal  100 mg twice daily.  Patient was admitted for further evaluation.  Assessment & Plan:   Principal Problem:   Acute metabolic encephalopathy Active Problems:   Parkinson's disease (HCC)   Primary CNS lymphoma (HCC)   UTI (urinary tract infection)   Elevated troponin   Acute metabolic encephalopathy: Likely secondary to UTI and and Dehydration. Wife reporting poor p.o. intake for few weeks. Continue IV hydration and IV antibiotics.   Follow up Urine and blood cultures. Steroids  have already been tapered off by neuro-oncology without any improvement of his symptoms.   CT head showing no acute intracranial finding.  No fever or meningeal signs.  TSH, vitamin B12 and ammonia level within normal limits.   Triage note mentions that the patient had a seizure on arrival to the ED. Wife reports him having staring spells at home although she confirms that he has been receiving his antiepileptic as prescribed.   Obtain  EEG ordered, seizure precautions.   Urinary tract infection: He was recently prescribed Keflex  which he has taken for 8 days without any improvement of his symptoms.  UA with evidence of pyuria.  Does have leukocytosis on labs.  No signs of sepsis at this time.  Initiated on ceftriaxone .  Follow-up urine and blood cultures.  Trend WBC count.   Mild troponin elevation: Troponin 33> 33, not consistent with ACS.   Patient is not endorsing chest pain. EKG showed Sinus tachycardia .    History of primary CNS lymphoma status post WBRT: Follows with Dr. [name].  He was previously on steroids which have now been tapered off due to concern for altered mental status / Hallucinations.  CT head today showing no acute findings.   Seizure disorder: Obtain EEG , seizure precautions.  Continue Lamictal .   Parkinson's disease: Continue Sinemet .   History of orthostatic hypotension: Wife states patient is no longer on midodrine .   Blood pressure currently stable.   DVT prophylaxis: Lovenox  Code Status: Full code Family Communication:Wife at bed side Disposition Plan:   Status is: Observation The patient remains OBS appropriate and will d/c before 2  midnights.  Patient admitted for altered mental status likely multifactorial in the setting of UTI and dehydration.   Patient is not medically ready for discharge.    Consultants:  None  Procedures: CT head  Antimicrobials:  Anti-infectives (From admission, onward)    Start     Dose/Rate Route Frequency  Ordered Stop   03/05/24 0000  cefTRIAXone  (ROCEPHIN ) 1 g in sodium chloride  0.9 % 100 mL IVPB  Status:  Discontinued        1 g 200 mL/hr over 30 Minutes Intravenous Every 24 hours 03/04/24 0051 03/04/24 0059   03/05/24 0000  cefTRIAXone  (ROCEPHIN ) 2 g in sodium chloride  0.9 % 100 mL IVPB        2 g 200 mL/hr over 30 Minutes Intravenous Every 24 hours 03/04/24 0059     03/04/24 0015  cefTRIAXone  (ROCEPHIN ) 2 g in sodium chloride  0.9 % 100 mL IVPB        2 g 200 mL/hr over 30 Minutes Intravenous  Once 03/04/24 0001 03/04/24 0052      Subjective: Patient was seen and examined at bedside.  Overnight events noted. Patient is alert and oriented,  following commands but he speaks in a husky voice which is his baseline.  Objective: Vitals:   03/04/24 0400 03/04/24 0500 03/04/24 0613 03/04/24 0620  BP: 122/63 116/70 (!) 139/95   Pulse:  98 98   Resp:  18 16   Temp:   97.7 F (36.5 C)   TempSrc:   Oral   SpO2:  99% 96%   Weight:    65.4 kg  Height:    5' 10 (1.778 m)   No intake or output data in the 24 hours ending 03/04/24 1005 Filed Weights   03/04/24 0620  Weight: 65.4 kg    Examination:  General exam: Appears calm and comfortable, deconditioned, not in any acute distress. Respiratory system: CTA Bilaterally . Respiratory effort normal.  RR16 Cardiovascular system: S1 & S2 heard, RRR. No JVD, murmurs, rubs, gallops or clicks.  Gastrointestinal system: Abdomen is non distended, soft and non tender.  Normal bowel sounds heard. Central nervous system: Alert and oriented x 3. No focal neurological deficits. Extremities: No edema, No cyanosis, No clubbing  Skin: No rashes, lesions or ulcers Psychiatry: Judgement and insight appear normal. Mood & affect appropriate.   Data Reviewed: I have personally reviewed following labs and imaging studies  CBC: Recent Labs  Lab 03/03/24 1630 03/04/24 0814  WBC 15.3* 20.1*  NEUTROABS 13.2*  --   HGB 13.2 12.1*  HCT 41.2 38.9*  MCV  97.9 100.3*  PLT 345 321   Basic Metabolic Panel: Recent Labs  Lab 03/03/24 1630  NA 143  K 4.0  CL 106  CO2 26  GLUCOSE 118*  BUN 28*  CREATININE 1.19  CALCIUM  9.3  MG 2.3   GFR: Estimated Creatinine Clearance: 46.6 mL/min (by C-G formula based on SCr of 1.19 mg/dL). Liver Function Tests: Recent Labs  Lab 03/03/24 1630  AST 29  ALT <5  ALKPHOS 80  BILITOT 0.7  PROT 6.3*  ALBUMIN 3.4*   Recent Labs  Lab 03/03/24 1630  LIPASE 22   Recent Labs  Lab 03/04/24 0111  AMMONIA 33   Coagulation Profile: No results for input(s): INR, PROTIME in the last 168 hours. Cardiac Enzymes: No results for input(s): CKTOTAL, CKMB, CKMBINDEX, TROPONINI in the last 168 hours. BNP (last 3 results) No results for input(s): PROBNP in the last 8760 hours. HbA1C: No results  for input(s): HGBA1C in the last 72 hours. CBG: Recent Labs  Lab 03/03/24 1935  GLUCAP 95   Lipid Profile: No results for input(s): CHOL, HDL, LDLCALC, TRIG, CHOLHDL, LDLDIRECT in the last 72 hours. Thyroid  Function Tests: Recent Labs    03/04/24 0111  TSH 0.809   Anemia Panel: Recent Labs    03/04/24 0111  VITAMINB12 1,052*   Sepsis Labs: No results for input(s): PROCALCITON, LATICACIDVEN in the last 168 hours.  Recent Results (from the past 240 hours)  Culture, blood (routine x 2)     Status: None (Preliminary result)   Collection Time: 03/03/24 10:57 PM   Specimen: BLOOD  Result Value Ref Range Status   Specimen Description   Final    BLOOD SITE NOT SPECIFIED Performed at [facility], [address], [place], [place] [address]    Special Requests   Final    BOTTLES DRAWN AEROBIC AND ANAEROBIC Blood Culture results may not be optimal due to an inadequate volume of blood received in culture bottles Performed at [facility], [address] [place], [place] [address]    Culture   Final    NO GROWTH < 12 HOURS Performed  at [facility], [address] [place], [place] [address]    Report Status PENDING  Incomplete  Culture, blood (routine x 2)     Status: None (Preliminary result)   Collection Time: 03/03/24 11:10 PM   Specimen: BLOOD  Result Value Ref Range Status   Specimen Description   Final    BLOOD SITE NOT SPECIFIED Performed at [facility], [address] [place], [place] [address]    Special Requests   Final    BOTTLES DRAWN AEROBIC AND ANAEROBIC Blood Culture results may not be optimal due to an inadequate volume of blood received in culture bottles Performed at [facility], [address] [place], [place] [address]    Culture   Final    NO GROWTH < 12 HOURS Performed at [facility], [address], [place], [place] [address]    Report Status PENDING  Incomplete    Radiology Studies: CT Head Wo Contrast Result Date: 03/04/2024 EXAM: CT HEAD WITHOUT CONTRAST 03/03/2024 10:51:04 PM TECHNIQUE: CT of the head was performed without the administration of intravenous contrast. Automated exposure control, iterative reconstruction, and/or weight based adjustment of the mA/kV was utilized to reduce the radiation dose to as low as reasonably achievable. COMPARISON: MRI brain 02/02/2024, head CT 09/24/2023. CLINICAL HISTORY: History of primary CNS lymphoma, increased agitation today, seizure on arrival, history of Parkinson's disease with deep brain stimulator wiring in place. FINDINGS: BRAIN AND VENTRICLES: No acute hemorrhage. No evidence of acute infarct. No hydrocephalus. No extra-axial collection. No mass effect or midline shift. Bifrontal approach deep brain stimulator wires terminating in the thalami with metallic artifacts. A small cortical biopsy defect in the left frontal lobe is again noted underlying a burr hole craniotomy site. There is mild-to-moderate global volume loss in the brain. Extensive hypoattenuating disease of the cerebral white matter most  likely due to residua of whole brain radiation. No hyperdense vessel is seen. There are no appreciable interval changes. ORBITS: Old lens replacements are again noted. Otherwise, the orbits are unremarkable. SINUSES: The sinuses are clear. Small posterior mastoid effusions are again present bilaterally. SOFT TISSUES AND SKULL: There is embedded wiring on both sides of the scalp. No acute soft tissue abnormality. No skull fracture. IMPRESSION:  1. No acute intracranial CT finding. 2. Extensive cerebral white matter hypoattenuation, most consistent with residua of whole brain radiation. 3. Bilateral frontal approach deep brain stimulator wires terminating in the thalami with associated metallic artifact. Electronically signed by: Francis Quam MD 03/04/2024 12:22 AM EST RP Workstation: [id number]   DG Chest Port 1 View Result Date: 03/03/2024 EXAM: 1 VIEW(S) XRAY OF THE CHEST 03/03/2024 04:54:00 PM COMPARISON: 09/15/2023 CLINICAL HISTORY: Multiple sclerosis. FINDINGS: LINES, TUBES AND DEVICES: Bilateral battery packs over upper chest. Cardiac leads noted. LUNGS AND PLEURA: Low lung volumes. Left basilar atelectasis. No focal pulmonary opacity. No pleural effusion. No pneumothorax. HEART AND MEDIASTINUM: Aortic atherosclerosis. No acute abnormality of the cardiac and mediastinal silhouettes. BONES AND SOFT TISSUES: Thoracic spondylosis. IMPRESSION: 1. No acute cardiopulmonary abnormality. Electronically signed by: Norman Gatlin MD 03/03/2024 05:09 PM EST RP Workstation: [id number]   Scheduled Meds:  carbidopa -levodopa   1 tablet Oral QHS   carbidopa -levodopa   2 tablet Oral QID   enoxaparin  (LOVENOX ) injection  40 mg Subcutaneous Q24H   feeding supplement  237 mL Oral BID BM   lamoTRIgine   100 mg Oral BID   Continuous Infusions:  sodium chloride  100 mL/hr at 03/04/24 0631   [START ON 03/05/2024] cefTRIAXone  (ROCEPHIN )  IV       LOS: 0 days    Time spent: 50 mins    Darcel Dawley, MD [facility]   If 7PM-7AM, please contact night-coverage  "

## 2024-03-04 NOTE — ED Notes (Signed)
Pt cleaned and new brief placed. 

## 2024-03-05 DIAGNOSIS — G9341 Metabolic encephalopathy: Secondary | ICD-10-CM

## 2024-03-05 LAB — CBC
HCT: 35.4 % — ABNORMAL LOW (ref 39.0–52.0)
Hemoglobin: 11.1 g/dL — ABNORMAL LOW (ref 13.0–17.0)
MCH: 31.9 pg (ref 26.0–34.0)
MCHC: 31.4 g/dL (ref 30.0–36.0)
MCV: 101.7 fL — ABNORMAL HIGH (ref 80.0–100.0)
Platelets: 282 10*3/uL (ref 150–400)
RBC: 3.48 MIL/uL — ABNORMAL LOW (ref 4.22–5.81)
RDW: 13.2 % (ref 11.5–15.5)
WBC: 17.3 10*3/uL — ABNORMAL HIGH (ref 4.0–10.5)
nRBC: 0 % (ref 0.0–0.2)

## 2024-03-05 LAB — BASIC METABOLIC PANEL WITH GFR
Anion gap: 8 (ref 5–15)
BUN: 23 mg/dL (ref 8–23)
CO2: 26 mmol/L (ref 22–32)
Calcium: 8.6 mg/dL — ABNORMAL LOW (ref 8.9–10.3)
Chloride: 111 mmol/L (ref 98–111)
Creatinine, Ser: 0.98 mg/dL (ref 0.61–1.24)
GFR, Estimated: >60 mL/min
Glucose, Bld: 109 mg/dL — ABNORMAL HIGH (ref 70–99)
Potassium: 3.1 mmol/L — ABNORMAL LOW (ref 3.5–5.1)
Sodium: 145 mmol/L (ref 135–145)

## 2024-03-05 LAB — MAGNESIUM: Magnesium: 2.1 mg/dL (ref 1.7–2.4)

## 2024-03-05 LAB — PHOSPHORUS: Phosphorus: 2.4 mg/dL — ABNORMAL LOW (ref 2.5–4.6)

## 2024-03-05 MED ORDER — POTASSIUM PHOSPHATES 15 MMOLE/5ML IV SOLN
30.0000 mmol | Freq: Once | INTRAVENOUS | Status: AC
  Administered 2024-03-05: 30 mmol via INTRAVENOUS
  Filled 2024-03-05: qty 30

## 2024-03-05 MED ORDER — SODIUM CHLORIDE 0.9 % IV SOLN
2.0000 g | Freq: Two times a day (BID) | INTRAVENOUS | Status: DC
  Administered 2024-03-05 – 2024-03-06 (×3): 2 g via INTRAVENOUS
  Filled 2024-03-05 (×3): qty 12.5

## 2024-03-05 MED ORDER — SODIUM CHLORIDE 0.9 % IV SOLN: INTRAVENOUS | Status: DC

## 2024-03-05 NOTE — Progress Notes (Signed)
 " PROGRESS NOTE    [name] [name]  FMW:[id number] DOB: March 13, 1945 DOA: 03/03/2024 PCP: Beverley Louann ONEIDA, MD    Brief Narrative:  This 79 y.o. male with PMH significant for primary CNS lymphoma status post WBRT, seizures, Parkinson's disease, orthostatic hypotension, BPH, depression, GERD.  He has recently seen  palliative care on 1/22 for increased confusion / hallucinations and impaired mobility.  His symptoms were attributed to urinary tract infection and he was prescribed a 10-day course of Keflex .  He was recently started on Seroquel  2 days ago for hallucinations and sleep disturbance.  Dr. Vaslow has recently tapered off his steroids.  Patient presented in the ED further evaluation of altered mental status / confusion / agitation.  Triage note mentions that patient had seizure on arrival in the ED.  Labs notable for WBC count 15.3, glucose 118, BUN 28, creatinine 1.19, normal lipase and LFTs, troponin 33> 33, UA with negative nitrite, large leukocytes, 21-50 RBCs, >50 WBCs, and no bacteria.  Urine culture blood cultures were obtained.  Chest x-ray no acute cardiopulmonary abnormality.  CT head without any acute intracranial finding.  Wife reports patient has multiple falls at home.  He sometimes crawling on the floor.  She also reports patient does have brief episodes where he starts staring and does not respond but there is no obvious tonic-clonic seizure-like activity reported.  Patient has been receiving Lamictal  100 mg twice daily.  Patient was admitted for further evaluation.  Assessment & Plan:   Principal Problem:   Acute metabolic encephalopathy Active Problems:   Parkinson's disease (HCC)   Primary CNS lymphoma (HCC)   UTI (urinary tract infection)   Elevated troponin   Acute metabolic encephalopathy: Likely secondary to UTI and and Dehydration. Wife reporting poor p.o. intake for few weeks. Continue IV hydration and IV antibiotics.   1 of 4 blood culture bottle grew staph could be  contaminant. Steroids have already been tapered off by neuro-oncology without any improvement of his symptoms.   CT head showing no acute intracranial finding.  No fever or meningeal signs.  TSH, vitamin B12 and ammonia level within normal limits.   Triage note mentions that the patient had a seizure on arrival to the ED. Wife reports him having staring spells at home although she confirms that he has been receiving his antiepileptic as prescribed.   Obtain EEG , seizure precautions.   Urinary tract infection: He was recently prescribed Keflex  which he has taken for 8 days without any improvement of his symptoms.  UA with evidence of pyuria.  Does have leukocytosis on labs.  No signs of sepsis at this time.  Initiated on ceftriaxone .   Urine cultures grew 60K Pseudomonas aeruginosa. Antibiotics switched to cefepime .  Follow-up WBC trend  Mild troponin elevation: Troponin 33> 33, not consistent with ACS.   Patient is not endorsing chest pain. EKG showed Sinus tachycardia . Likely demand ischemia in the setting of above.  History of primary CNS lymphoma status post WBRT: Follows with Dr. [name].  He was previously on steroids which have now been tapered off due to concern for altered mental status / Hallucinations.  CT head today showing no acute findings.   Seizure disorder: Obtain EEG , seizure precautions.  Continue Lamictal .   Parkinson's disease: Continue Sinemet .   History of orthostatic hypotension: Wife states patient is no longer on midodrine .   Blood pressure currently stable.   DVT prophylaxis: Lovenox  Code Status: Full code Family Communication:Wife at bed side Disposition Plan:  Status is: Inpatient Remains inpatient appropriate because:     Patient admitted for altered mental status likely multifactorial in the setting of UTI and dehydration.    Urine cultures are growing Pseudomonas aeruginosa.   Patient is not medically ready for discharge.     Consultants:  None  Procedures: CT head  Antimicrobials:  Anti-infectives (From admission, onward)    Start     Dose/Rate Route Frequency Ordered Stop   03/05/24 0000  cefTRIAXone  (ROCEPHIN ) 1 g in sodium chloride  0.9 % 100 mL IVPB  Status:  Discontinued        1 g 200 mL/hr over 30 Minutes Intravenous Every 24 hours 03/04/24 0051 03/04/24 0059   03/05/24 0000  cefTRIAXone  (ROCEPHIN ) 2 g in sodium chloride  0.9 % 100 mL IVPB        2 g 200 mL/hr over 30 Minutes Intravenous Every 24 hours 03/04/24 0059     03/04/24 0015  cefTRIAXone  (ROCEPHIN ) 2 g in sodium chloride  0.9 % 100 mL IVPB        2 g 200 mL/hr over 30 Minutes Intravenous  Once 03/04/24 0001 03/04/24 0052      Subjective: Patient was seen and examined at bedside.  Overnight events noted. Patient speaks in a husky voice which is his baseline. Wife reports last night he was very agitated and was given clonazepam  which calmed him down.   Objective: Vitals:   03/04/24 1426 03/04/24 2017 03/04/24 2208 03/05/24 0630  BP: 124/69 113/65 123/66 (!) 93/55  Pulse: 96 (!) 106 (!) 106 94  Resp:  20 18 20   Temp:  (!) 97.3 F (36.3 C) 99.5 F (37.5 C) 99.5 F (37.5 C)  TempSrc:  Oral Oral Axillary  SpO2:  94% 91% 95%  Weight:      Height:        Intake/Output Summary (Last 24 hours) at 03/05/2024 1039 Last data filed at 03/05/2024 0631 Gross per 24 hour  Intake 220 ml  Output 250 ml  Net -30 ml   Filed Weights   03/04/24 0620  Weight: 65.4 kg    Examination:  General exam: Appears calm and comfortable, deconditioned, not in any acute distress. Respiratory system: CTA Bilaterally. Respiratory effort normal.  RR15 Cardiovascular system: S1 & S2 heard, RRR. No JVD, murmurs, rubs, gallops or clicks.  Gastrointestinal system: Abdomen is non distended, soft and non tender.  Normal bowel sounds heard. Central nervous system: Alert and oriented x 2. No focal neurological deficits. Extremities: No edema, No cyanosis, No  clubbing  Skin: No rashes, lesions or ulcers Psychiatry:Mood & affect appropriate.   Data Reviewed: I have personally reviewed following labs and imaging studies  CBC: Recent Labs  Lab 03/03/24 1630 03/04/24 0814 03/05/24 0428  WBC 15.3* 20.1* 17.3*  NEUTROABS 13.2*  --   --   HGB 13.2 12.1* 11.1*  HCT 41.2 38.9* 35.4*  MCV 97.9 100.3* 101.7*  PLT 345 321 282   Basic Metabolic Panel: Recent Labs  Lab 03/03/24 1630 03/05/24 0428  NA 143 145  K 4.0 3.1*  CL 106 111  CO2 26 26  GLUCOSE 118* 109*  BUN 28* 23  CREATININE 1.19 0.98  CALCIUM  9.3 8.6*  MG 2.3 2.1  PHOS  --  2.4*   GFR: Estimated Creatinine Clearance: 56.5 mL/min (by C-G formula based on SCr of 0.98 mg/dL). Liver Function Tests: Recent Labs  Lab 03/03/24 1630  AST 29  ALT <5  ALKPHOS 80  BILITOT 0.7  PROT 6.3*  ALBUMIN 3.4*   Recent Labs  Lab 03/03/24 1630  LIPASE 22   Recent Labs  Lab 03/04/24 0111  AMMONIA 33   Coagulation Profile: No results for input(s): INR, PROTIME in the last 168 hours. Cardiac Enzymes: No results for input(s): CKTOTAL, CKMB, CKMBINDEX, TROPONINI in the last 168 hours. BNP (last 3 results) No results for input(s): PROBNP in the last 8760 hours. HbA1C: No results for input(s): HGBA1C in the last 72 hours. CBG: Recent Labs  Lab 03/03/24 1935  GLUCAP 95   Lipid Profile: No results for input(s): CHOL, HDL, LDLCALC, TRIG, CHOLHDL, LDLDIRECT in the last 72 hours. Thyroid  Function Tests: Recent Labs    03/04/24 0111  TSH 0.809   Anemia Panel: Recent Labs    03/04/24 0111  VITAMINB12 1,052*   Sepsis Labs: No results for input(s): PROCALCITON, LATICACIDVEN in the last 168 hours.  Recent Results (from the past 240 hours)  Urine Culture     Status: Abnormal (Preliminary result)   Collection Time: 03/03/24 10:34 PM   Specimen: Urine, Clean Catch  Result Value Ref Range Status   Specimen Description   Final    URINE, CLEAN  CATCH Performed at [facility], [address] [place], [place] [address]    Special Requests   Final    NONE Performed at [facility], [address] [place], [place] [address]    Culture (A)  Final    60,000 COLONIES/mL PSEUDOMONAS AERUGINOSA SUSCEPTIBILITIES TO FOLLOW Performed at [facility], [address] [place], [place] [address]    Report Status PENDING  Incomplete  Culture, blood (routine x 2)     Status: None (Preliminary result)   Collection Time: 03/03/24 10:57 PM   Specimen: BLOOD  Result Value Ref Range Status   Specimen Description   Final    BLOOD SITE NOT SPECIFIED Performed at [facility], [address], [place], [place] [address]    Special Requests   Final    BOTTLES DRAWN AEROBIC AND ANAEROBIC Blood Culture results may not be optimal due to an inadequate volume of blood received in culture bottles Performed at [facility], [address], [place], [place] [address]    Culture  Setup Time   Final    VONNE NEGATIVE RODS GRAM POSITIVE COCCI AEROBIC BOTTLE ONLY CRITICAL RESULT CALLED TO, READ BACK BY AND VERIFIED WITH:  CEDRIC CAVA PHARMD 03/04/2024 BY DD @ 2120 Performed at [facility], [address] [place], [place] [address]    Culture GRAM POSITIVE COCCI  Final   Report Status PENDING  Incomplete  Blood Culture ID Panel (Reflexed)     Status: Abnormal   Collection Time: 03/03/24 10:57 PM  Result Value Ref Range Status   Enterococcus faecalis NOT DETECTED NOT DETECTED Final   Enterococcus Faecium NOT DETECTED NOT DETECTED Final   Listeria monocytogenes NOT DETECTED NOT DETECTED Final   Staphylococcus species DETECTED (A) NOT DETECTED Final    Comment: CRITICAL RESULT CALLED TO, READ BACK BY AND VERIFIED WITH:  CEDRIC CAVA PHARMD 03/04/2024 BY DD @ 2120    Staphylococcus aureus (BCID) NOT DETECTED NOT DETECTED Final   Staphylococcus epidermidis  NOT DETECTED NOT DETECTED Final   Staphylococcus lugdunensis NOT DETECTED NOT DETECTED Final   Streptococcus species NOT DETECTED NOT DETECTED Final   Streptococcus agalactiae NOT DETECTED NOT DETECTED Final   Streptococcus pneumoniae NOT DETECTED NOT DETECTED Final   Streptococcus pyogenes NOT DETECTED  NOT DETECTED Final   A.calcoaceticus-baumannii NOT DETECTED NOT DETECTED Final   Bacteroides fragilis NOT DETECTED NOT DETECTED Final   Enterobacterales NOT DETECTED NOT DETECTED Final   Enterobacter cloacae complex NOT DETECTED NOT DETECTED Final   Escherichia coli NOT DETECTED NOT DETECTED Final   Klebsiella aerogenes NOT DETECTED NOT DETECTED Final   Klebsiella oxytoca NOT DETECTED NOT DETECTED Final   Klebsiella pneumoniae NOT DETECTED NOT DETECTED Final   Proteus species NOT DETECTED NOT DETECTED Final   Salmonella species NOT DETECTED NOT DETECTED Final   Serratia marcescens NOT DETECTED NOT DETECTED Final   Haemophilus influenzae NOT DETECTED NOT DETECTED Final   Neisseria meningitidis NOT DETECTED NOT DETECTED Final   Pseudomonas aeruginosa NOT DETECTED NOT DETECTED Final   Stenotrophomonas maltophilia NOT DETECTED NOT DETECTED Final   Candida albicans NOT DETECTED NOT DETECTED Final   Candida auris NOT DETECTED NOT DETECTED Final   Candida glabrata NOT DETECTED NOT DETECTED Final   Candida krusei NOT DETECTED NOT DETECTED Final   Candida parapsilosis NOT DETECTED NOT DETECTED Final   Candida tropicalis NOT DETECTED NOT DETECTED Final   Cryptococcus neoformans/gattii NOT DETECTED NOT DETECTED Final    Comment: Performed at [facility], [address] [place], [place] [address]  Culture, blood (routine x 2)     Status: None (Preliminary result)   Collection Time: 03/03/24 11:10 PM   Specimen: BLOOD  Result Value Ref Range Status   Specimen Description   Final    BLOOD SITE NOT SPECIFIED Performed at [facility], [address] [place], [place] [address]     Special Requests   Final    BOTTLES DRAWN AEROBIC AND ANAEROBIC Blood Culture results may not be optimal due to an inadequate volume of blood received in culture bottles Performed at [facility], [address], [place], [place] [address]    Culture   Final    NO GROWTH 1 DAY Performed at [facility], [address], [place], [place] [address]    Report Status PENDING  Incomplete    Radiology Studies: CT Head Wo Contrast Result Date: 03/04/2024 EXAM: CT HEAD WITHOUT CONTRAST 03/03/2024 10:51:04 PM TECHNIQUE: CT of the head was performed without the administration of intravenous contrast. Automated exposure control, iterative reconstruction, and/or weight based adjustment of the mA/kV was utilized to reduce the radiation dose to as low as reasonably achievable. COMPARISON: MRI brain 02/02/2024, head CT 09/24/2023. CLINICAL HISTORY: History of primary CNS lymphoma, increased agitation today, seizure on arrival, history of Parkinson's disease with deep brain stimulator wiring in place. FINDINGS: BRAIN AND VENTRICLES: No acute hemorrhage. No evidence of acute infarct. No hydrocephalus. No extra-axial collection. No mass effect or midline shift. Bifrontal approach deep brain stimulator wires terminating in the thalami with metallic artifacts. A small cortical biopsy defect in the left frontal lobe is again noted underlying a burr hole craniotomy site. There is mild-to-moderate global volume loss in the brain. Extensive hypoattenuating disease of the cerebral white matter most likely due to residua of whole brain radiation. No hyperdense vessel is seen. There are no appreciable interval changes. ORBITS: Old lens replacements are again noted. Otherwise, the orbits are unremarkable. SINUSES: The sinuses are clear. Small posterior mastoid effusions are again present bilaterally. SOFT TISSUES AND SKULL: There is embedded wiring on both sides of the scalp. No acute soft tissue  abnormality. No skull fracture. IMPRESSION: 1. No acute intracranial CT finding. 2. Extensive cerebral white matter hypoattenuation,  most consistent with residua of whole brain radiation. 3. Bilateral frontal approach deep brain stimulator wires terminating in the thalami with associated metallic artifact. Electronically signed by: Francis Quam MD 03/04/2024 12:22 AM EST RP Workstation: [id number]   DG Chest Port 1 View Result Date: 03/03/2024 EXAM: 1 VIEW(S) XRAY OF THE CHEST 03/03/2024 04:54:00 PM COMPARISON: 09/15/2023 CLINICAL HISTORY: Multiple sclerosis. FINDINGS: LINES, TUBES AND DEVICES: Bilateral battery packs over upper chest. Cardiac leads noted. LUNGS AND PLEURA: Low lung volumes. Left basilar atelectasis. No focal pulmonary opacity. No pleural effusion. No pneumothorax. HEART AND MEDIASTINUM: Aortic atherosclerosis. No acute abnormality of the cardiac and mediastinal silhouettes. BONES AND SOFT TISSUES: Thoracic spondylosis. IMPRESSION: 1. No acute cardiopulmonary abnormality. Electronically signed by: Norman Gatlin MD 03/03/2024 05:09 PM EST RP Workstation: [id number]   Scheduled Meds:  carbidopa -levodopa   1 tablet Oral QHS   carbidopa -levodopa   2 tablet Oral QID   enoxaparin  (LOVENOX ) injection  40 mg Subcutaneous Q24H   feeding supplement  237 mL Oral BID BM   lamoTRIgine   100 mg Oral BID   Continuous Infusions:  sodium chloride  75 mL/hr at 03/05/24 1019   cefTRIAXone  (ROCEPHIN )  IV 2 g (03/05/24 0016)     LOS: 1 day    Time spent: 50 mins    Darcel Dawley, MD [facility]   If 7PM-7AM, please contact night-coverage  "

## 2024-03-05 NOTE — Plan of Care (Signed)
  Problem: Elimination: Goal: Will not experience complications related to bowel motility Outcome: Progressing Goal: Will not experience complications related to urinary retention Outcome: Progressing   

## 2024-03-05 NOTE — Progress Notes (Signed)
 Pharmacy Antibiotic Note  [name] [name] is a 79 y.o. male admitted on 03/03/2024 with urosepsis.  Pharmacy has been consulted for Cefepime  dosing.  Plan: Cefepime  2g IV q12h Follow up renal function, culture results, and clinical course.   Height: 5' 10 (177.8 cm) Weight: 65.4 kg (144 lb 2.9 oz) IBW/kg (Calculated) : 73  Temp (24hrs), Avg:98.5 F (36.9 C), Min:97.3 F (36.3 C), Max:99.5 F (37.5 C)  Recent Labs  Lab 03/03/24 1630 03/04/24 0814 03/05/24 0428  WBC 15.3* 20.1* 17.3*  CREATININE 1.19  --  0.98    Estimated Creatinine Clearance: 56.5 mL/min (by C-G formula based on SCr of 0.98 mg/dL).    Allergies[1]  Antimicrobials this admission: 1/31 Ceftriaxone  >> 2/1 2/1 Cefepime  >>   Microbiology results: 1/30 UCx: 60,000 Pseudomonas aeruginosa (sens pending) 1/30 BCx: GNR and GPC in aerobic bottle of one set.  NGTD in other set 1/31 BCID:  Staphylococcus species, GNR not yet identified   Thank you for allowing pharmacy to be a part of this patients care.  Wanda Hasting PharmD, BCPS [facility] main pharmacy [phone] 03/05/2024 10:45 AM      [1]  Allergies Allergen Reactions   Alfuzosin  Other (See Comments)    Hypotension    Rivastigmine Tartrate Nausea And Vomiting     Can tolerate the patch but can not tolerate the pills   Tamsulosin Other (See Comments)    Severe joint pain

## 2024-03-06 ENCOUNTER — Inpatient Hospital Stay (HOSPITAL_COMMUNITY)
Admit: 2024-03-06 | Discharge: 2024-03-06 | Disposition: A | Discharge status: Home / Self Care | Attending: Internal Medicine | Admitting: Internal Medicine

## 2024-03-06 DIAGNOSIS — G9341 Metabolic encephalopathy: Secondary | ICD-10-CM | POA: Diagnosis not present

## 2024-03-06 DIAGNOSIS — R569 Unspecified convulsions: Secondary | ICD-10-CM | POA: Diagnosis not present

## 2024-03-06 DIAGNOSIS — R4182 Altered mental status, unspecified: Secondary | ICD-10-CM

## 2024-03-06 LAB — CBC
HCT: 36.4 % — ABNORMAL LOW (ref 39.0–52.0)
Hemoglobin: 11.5 g/dL — ABNORMAL LOW (ref 13.0–17.0)
MCH: 31.1 pg (ref 26.0–34.0)
MCHC: 31.6 g/dL (ref 30.0–36.0)
MCV: 98.4 fL (ref 80.0–100.0)
Platelets: 270 10*3/uL (ref 150–400)
RBC: 3.7 MIL/uL — ABNORMAL LOW (ref 4.22–5.81)
RDW: 13.2 % (ref 11.5–15.5)
WBC: 11.1 10*3/uL — ABNORMAL HIGH (ref 4.0–10.5)
nRBC: 0 % (ref 0.0–0.2)

## 2024-03-06 LAB — BASIC METABOLIC PANEL WITH GFR
Anion gap: 8 (ref 5–15)
BUN: 23 mg/dL (ref 8–23)
CO2: 24 mmol/L (ref 22–32)
Calcium: 8.6 mg/dL — ABNORMAL LOW (ref 8.9–10.3)
Chloride: 114 mmol/L — ABNORMAL HIGH (ref 98–111)
Creatinine, Ser: 1.15 mg/dL (ref 0.61–1.24)
GFR, Estimated: >60 mL/min
Glucose, Bld: 101 mg/dL — ABNORMAL HIGH (ref 70–99)
Potassium: 3.9 mmol/L (ref 3.5–5.1)
Sodium: 146 mmol/L — ABNORMAL HIGH (ref 135–145)

## 2024-03-06 LAB — PHOSPHORUS: Phosphorus: 3.4 mg/dL (ref 2.5–4.6)

## 2024-03-06 LAB — URINE CULTURE: Culture: 60000 — AB

## 2024-03-06 LAB — MAGNESIUM: Magnesium: 2.2 mg/dL (ref 1.7–2.4)

## 2024-03-06 MED ORDER — SODIUM CHLORIDE 0.9 % IV SOLN
1.0000 g | Freq: Three times a day (TID) | INTRAVENOUS | Status: DC
  Administered 2024-03-06 – 2024-03-07 (×2): 1 g via INTRAVENOUS
  Filled 2024-03-06 (×3): qty 20

## 2024-03-06 NOTE — Progress Notes (Signed)
 EEG complete - results pending

## 2024-03-06 NOTE — Plan of Care (Signed)
  Problem: Clinical Measurements: Goal: Ability to maintain clinical measurements within normal limits will improve Outcome: Progressing Goal: Will remain free from infection Outcome: Progressing Goal: Diagnostic test results will improve Outcome: Progressing Goal: Respiratory complications will improve Outcome: Progressing Goal: Cardiovascular complication will be avoided Outcome: Progressing   Problem: Activity: Goal: Risk for activity intolerance will decrease Outcome: Progressing   Problem: Nutrition: Goal: Adequate nutrition will be maintained Outcome: Progressing   Problem: Coping: Goal: Level of anxiety will decrease Outcome: Progressing   Problem: Elimination: Goal: Will not experience complications related to bowel motility Outcome: Progressing Goal: Will not experience complications related to urinary retention Outcome: Progressing   Problem: Pain Managment: Goal: General experience of comfort will improve and/or be controlled Outcome: Progressing   Problem: Safety: Goal: Ability to remain free from injury will improve Outcome: Progressing   Problem: Skin Integrity: Goal: Risk for impaired skin integrity will decrease Outcome: Progressing   Problem: Safety: Goal: Non-violent Restraint(s) Outcome: Progressing

## 2024-03-07 DIAGNOSIS — G9341 Metabolic encephalopathy: Secondary | ICD-10-CM | POA: Diagnosis not present

## 2024-03-07 LAB — CBC
HCT: 36.3 % — ABNORMAL LOW (ref 39.0–52.0)
Hemoglobin: 11.4 g/dL — ABNORMAL LOW (ref 13.0–17.0)
MCH: 31.1 pg (ref 26.0–34.0)
MCHC: 31.4 g/dL (ref 30.0–36.0)
MCV: 98.9 fL (ref 80.0–100.0)
Platelets: 280 10*3/uL (ref 150–400)
RBC: 3.67 MIL/uL — ABNORMAL LOW (ref 4.22–5.81)
RDW: 12.9 % (ref 11.5–15.5)
WBC: 10.8 10*3/uL — ABNORMAL HIGH (ref 4.0–10.5)
nRBC: 0 % (ref 0.0–0.2)

## 2024-03-07 LAB — BASIC METABOLIC PANEL WITH GFR
Anion gap: 9 (ref 5–15)
BUN: 19 mg/dL (ref 8–23)
CO2: 24 mmol/L (ref 22–32)
Calcium: 8.4 mg/dL — ABNORMAL LOW (ref 8.9–10.3)
Chloride: 112 mmol/L — ABNORMAL HIGH (ref 98–111)
Creatinine, Ser: 0.79 mg/dL (ref 0.61–1.24)
GFR, Estimated: >60 mL/min
Glucose, Bld: 85 mg/dL (ref 70–99)
Potassium: 3.4 mmol/L — ABNORMAL LOW (ref 3.5–5.1)
Sodium: 145 mmol/L (ref 135–145)

## 2024-03-07 LAB — MAGNESIUM: Magnesium: 2.1 mg/dL (ref 1.7–2.4)

## 2024-03-07 LAB — PHOSPHORUS: Phosphorus: 2 mg/dL — ABNORMAL LOW (ref 2.5–4.6)

## 2024-03-07 MED ORDER — SODIUM CHLORIDE 0.9 % IV SOLN: INTRAVENOUS | Status: AC

## 2024-03-07 MED ORDER — POTASSIUM PHOSPHATES 15 MMOLE/5ML IV SOLN
30.0000 mmol | Freq: Once | INTRAVENOUS | Status: AC
  Administered 2024-03-07: 30 mmol via INTRAVENOUS
  Filled 2024-03-07: qty 30

## 2024-03-07 NOTE — Plan of Care (Signed)
  Problem: Clinical Measurements: Goal: Ability to maintain clinical measurements within normal limits will improve Outcome: Progressing Goal: Will remain free from infection Outcome: Progressing Goal: Diagnostic test results will improve Outcome: Progressing Goal: Respiratory complications will improve Outcome: Progressing Goal: Cardiovascular complication will be avoided Outcome: Progressing   Problem: Activity: Goal: Risk for activity intolerance will decrease Outcome: Progressing   Problem: Nutrition: Goal: Adequate nutrition will be maintained Outcome: Progressing   Problem: Coping: Goal: Level of anxiety will decrease Outcome: Progressing   Problem: Elimination: Goal: Will not experience complications related to bowel motility Outcome: Progressing Goal: Will not experience complications related to urinary retention Outcome: Progressing   Problem: Pain Managment: Goal: General experience of comfort will improve and/or be controlled Outcome: Progressing   Problem: Safety: Goal: Ability to remain free from injury will improve Outcome: Progressing   Problem: Skin Integrity: Goal: Risk for impaired skin integrity will decrease Outcome: Progressing   Problem: Safety: Goal: Non-violent Restraint(s) Outcome: Progressing

## 2024-03-08 ENCOUNTER — Inpatient Hospital Stay (HOSPITAL_COMMUNITY)

## 2024-03-08 ENCOUNTER — Other Ambulatory Visit: Payer: Self-pay

## 2024-03-08 ENCOUNTER — Inpatient Hospital Stay: Attending: Neurosurgery | Admitting: Nurse Practitioner

## 2024-03-08 DIAGNOSIS — G20A1 Parkinson's disease without dyskinesia, without mention of fluctuations: Secondary | ICD-10-CM

## 2024-03-08 DIAGNOSIS — N390 Urinary tract infection, site not specified: Secondary | ICD-10-CM

## 2024-03-08 DIAGNOSIS — I959 Hypotension, unspecified: Secondary | ICD-10-CM

## 2024-03-08 DIAGNOSIS — G9341 Metabolic encephalopathy: Secondary | ICD-10-CM | POA: Diagnosis not present

## 2024-03-08 DIAGNOSIS — C8339 Primary central nervous system lymphoma: Secondary | ICD-10-CM

## 2024-03-08 DIAGNOSIS — I480 Paroxysmal atrial fibrillation: Secondary | ICD-10-CM | POA: Diagnosis not present

## 2024-03-08 DIAGNOSIS — I4891 Unspecified atrial fibrillation: Secondary | ICD-10-CM

## 2024-03-08 LAB — CULTURE, BLOOD (ROUTINE X 2)

## 2024-03-08 LAB — BASIC METABOLIC PANEL WITH GFR
Anion gap: 11 (ref 5–15)
BUN: 15 mg/dL (ref 8–23)
CO2: 23 mmol/L (ref 22–32)
Calcium: 8.4 mg/dL — ABNORMAL LOW (ref 8.9–10.3)
Chloride: 112 mmol/L — ABNORMAL HIGH (ref 98–111)
Creatinine, Ser: 0.88 mg/dL (ref 0.61–1.24)
GFR, Estimated: >60 mL/min
Glucose, Bld: 93 mg/dL (ref 70–99)
Potassium: 3.6 mmol/L (ref 3.5–5.1)
Sodium: 145 mmol/L (ref 135–145)

## 2024-03-08 LAB — PHOSPHORUS: Phosphorus: 2.4 mg/dL — ABNORMAL LOW (ref 2.5–4.6)

## 2024-03-08 LAB — CBC
HCT: 36.9 % — ABNORMAL LOW (ref 39.0–52.0)
Hemoglobin: 11.9 g/dL — ABNORMAL LOW (ref 13.0–17.0)
MCH: 31.2 pg (ref 26.0–34.0)
MCHC: 32.2 g/dL (ref 30.0–36.0)
MCV: 96.6 fL (ref 80.0–100.0)
Platelets: 336 10*3/uL (ref 150–400)
RBC: 3.82 MIL/uL — ABNORMAL LOW (ref 4.22–5.81)
RDW: 12.9 % (ref 11.5–15.5)
WBC: 12.9 10*3/uL — ABNORMAL HIGH (ref 4.0–10.5)
nRBC: 0 % (ref 0.0–0.2)

## 2024-03-08 LAB — ECHOCARDIOGRAM COMPLETE
AR max vel: 2.47 cm2
AV Area VTI: 2.8 cm2
AV Area mean vel: 2.46 cm2
AV Mean grad: 7 mmHg
AV Peak grad: 13.6 mmHg
Ao pk vel: 1.85 m/s
Area-P 1/2: 4.06 cm2
Height: 70 in
S' Lateral: 2.6 cm
Weight: 2321 [oz_av]

## 2024-03-08 LAB — LACTIC ACID, PLASMA
Lactic Acid, Venous: 2.5 mmol/L (ref 0.5–1.9)
Lactic Acid, Venous: 4.1 mmol/L (ref 0.5–1.9)

## 2024-03-08 LAB — HEPARIN LEVEL (UNFRACTIONATED): Heparin Unfractionated: 0.47 [IU]/mL (ref 0.30–0.70)

## 2024-03-08 LAB — MAGNESIUM: Magnesium: 2 mg/dL (ref 1.7–2.4)

## 2024-03-08 LAB — MRSA NEXT GEN BY PCR, NASAL: MRSA by PCR Next Gen: NOT DETECTED

## 2024-03-08 LAB — CORTISOL: Cortisol, Plasma: 15.7 ug/dL

## 2024-03-08 MED ORDER — CHLORHEXIDINE GLUCONATE CLOTH 2 % EX PADS
6.0000 | MEDICATED_PAD | Freq: Every day | CUTANEOUS | Status: AC
  Administered 2024-03-08 – 2024-03-10 (×3): 6 via TOPICAL

## 2024-03-08 MED ORDER — PERFLUTREN LIPID MICROSPHERE
1.0000 mL | INTRAVENOUS | Status: AC | PRN
  Administered 2024-03-08: 3 mL via INTRAVENOUS

## 2024-03-08 MED ORDER — LAMOTRIGINE 100 MG PO TABS
100.0000 mg | ORAL_TABLET | Freq: Two times a day (BID) | ORAL | Status: AC
  Administered 2024-03-08 – 2024-03-10 (×4): 100 mg
  Filled 2024-03-08 (×4): qty 1

## 2024-03-08 MED ORDER — SODIUM CHLORIDE 0.9 % IV SOLN
2.0000 g | Freq: Three times a day (TID) | INTRAVENOUS | Status: AC
  Administered 2024-03-08 – 2024-03-10 (×8): 2 g via INTRAVENOUS
  Filled 2024-03-08 (×8): qty 12.5

## 2024-03-08 MED ORDER — AMIODARONE LOAD VIA INFUSION
150.0000 mg | Freq: Once | INTRAVENOUS | Status: AC
  Administered 2024-03-08: 150 mg via INTRAVENOUS
  Filled 2024-03-08: qty 83.34

## 2024-03-08 MED ORDER — VANCOMYCIN HCL IN DEXTROSE 1-5 GM/200ML-% IV SOLN
1000.0000 mg | Freq: Once | INTRAVENOUS | Status: AC
  Administered 2024-03-08: 1000 mg via INTRAVENOUS
  Filled 2024-03-08: qty 200

## 2024-03-08 MED ORDER — LACTATED RINGERS IV BOLUS
1000.0000 mL | Freq: Once | INTRAVENOUS | Status: AC
  Administered 2024-03-08: 1000 mL via INTRAVENOUS

## 2024-03-08 MED ORDER — PHENYLEPHRINE HCL-NACL 20-0.9 MG/250ML-% IV SOLN: 25.0000 ug/min | INTRAVENOUS | Status: DC

## 2024-03-08 MED ORDER — VANCOMYCIN HCL 750 MG/150ML IV SOLN
750.0000 mg | Freq: Two times a day (BID) | INTRAVENOUS | Status: DC
  Administered 2024-03-08 – 2024-03-09 (×3): 750 mg via INTRAVENOUS
  Filled 2024-03-08 (×4): qty 150

## 2024-03-08 MED ORDER — AMIODARONE IV BOLUS ONLY 150 MG/100ML
150.0000 mg | Freq: Once | INTRAVENOUS | Status: AC
  Administered 2024-03-08: 150 mg via INTRAVENOUS

## 2024-03-08 MED ORDER — HEPARIN (PORCINE) 25000 UT/250ML-% IV SOLN
950.0000 [IU]/h | INTRAVENOUS | Status: AC
  Administered 2024-03-08 – 2024-03-10 (×3): 950 [IU]/h via INTRAVENOUS
  Filled 2024-03-08 (×3): qty 250

## 2024-03-08 MED ORDER — AMIODARONE HCL IN DEXTROSE 360-4.14 MG/200ML-% IV SOLN
60.0000 mg/h | INTRAVENOUS | Status: AC
  Administered 2024-03-08 (×2): 60 mg/h via INTRAVENOUS
  Filled 2024-03-08 (×2): qty 200

## 2024-03-08 MED ORDER — SODIUM CHLORIDE 0.9% FLUSH: 10.0000 mL | INTRAVENOUS | Status: AC | PRN

## 2024-03-08 MED ORDER — QUETIAPINE FUMARATE 50 MG PO TABS
25.0000 mg | ORAL_TABLET | Freq: Every day | ORAL | Status: DC
  Filled 2024-03-08: qty 1

## 2024-03-08 MED ORDER — PANTOPRAZOLE SODIUM 40 MG IV SOLR
40.0000 mg | Freq: Every day | INTRAVENOUS | Status: AC
  Administered 2024-03-08 – 2024-03-10 (×3): 40 mg via INTRAVENOUS
  Filled 2024-03-08 (×3): qty 10

## 2024-03-08 MED ORDER — HALOPERIDOL LACTATE 5 MG/ML IJ SOLN
2.0000 mg | Freq: Once | INTRAMUSCULAR | Status: AC
  Administered 2024-03-08: 2 mg via INTRAVENOUS
  Filled 2024-03-08: qty 1

## 2024-03-08 MED ORDER — POTASSIUM PHOSPHATES 15 MMOLE/5ML IV SOLN
30.0000 mmol | Freq: Once | INTRAVENOUS | Status: AC
  Administered 2024-03-08: 30 mmol via INTRAVENOUS
  Filled 2024-03-08: qty 30

## 2024-03-08 MED ORDER — SODIUM CHLORIDE 0.9 % IV SOLN
250.0000 mL | INTRAVENOUS | Status: AC
  Administered 2024-03-08: 250 mL via INTRAVENOUS

## 2024-03-08 MED ORDER — LACTATED RINGERS IV SOLN: INTRAVENOUS | Status: AC

## 2024-03-08 MED ORDER — AMIODARONE HCL IN DEXTROSE 360-4.14 MG/200ML-% IV SOLN
30.0000 mg/h | INTRAVENOUS | Status: DC
  Administered 2024-03-08 – 2024-03-09 (×2): 30 mg/h via INTRAVENOUS
  Filled 2024-03-08 (×2): qty 200

## 2024-03-08 MED ORDER — HEPARIN BOLUS VIA INFUSION
3000.0000 [IU] | Freq: Once | INTRAVENOUS | Status: AC
  Administered 2024-03-08: 3000 [IU] via INTRAVENOUS
  Filled 2024-03-08: qty 3000

## 2024-03-08 NOTE — Progress Notes (Signed)
 Pharmacy Antibiotic Note  [name] [name] is a 79 y.o. male admitted on 03/03/2024 with AMS and UTI. Received a 10d course of keflex  as outpatient for UTI. Patient previously on Ceftriaxone  >> Cefepime  + Meropenem  for PSA UTI and positive blood cultures. Was then monitored off antibiotics per ID recommendations. Pharmacy now consulted for vancomycin  and cefepime  dosing for sepsis.  Plan: Vancomycin  1000mg  IV x1, followed by 750mg  IV q12h (eAUC 507.8, Scr 0.88, Vd 0.72) Cefepime  2g IV q8h  Monitor renal function, clinical status, ability to de-escalate as appropriate  Follow up cultures   Height: 5' 10 (177.8 cm) Weight: 65.4 kg (144 lb 2.9 oz) IBW/kg (Calculated) : 73  Temp (24hrs), Avg:98.2 F (36.8 C), Min:97.5 F (36.4 C), Max:98.9 F (37.2 C)  Recent Labs  Lab 03/03/24 1630 03/04/24 0814 03/05/24 0428 03/06/24 0337 03/07/24 0350 03/08/24 0711  WBC 15.3* 20.1* 17.3* 11.1* 10.8* 12.9*  CREATININE 1.19  --  0.98 1.15 0.79 0.88    Estimated Creatinine Clearance: 63 mL/min (by C-G formula based on SCr of 0.88 mg/dL).    Allergies[1]  Antimicrobials this admission: CRO 1/31>>2/1 Cefepime  2/1 >>2/2; 2/4 >>  Meropenem  2/2 >> 2/3 Vanc 2/4 >>  Dose adjustments this admission:   Microbiology results: 1/30 bcx: 1/4; BCID: acinetobacter lwoffi, staph saprophyticus (likely contam) 1/30 ucx: 60k PSA  Thank you for allowing pharmacy to be a part of this patients care.  Davell Beckstead 03/08/2024 9:01 AM     [1]  Allergies Allergen Reactions   Alfuzosin  Other (See Comments)    Hypotension    Rivastigmine Tartrate Nausea And Vomiting     Can tolerate the patch but can not tolerate the pills   Tamsulosin Other (See Comments)    Severe joint pain

## 2024-03-08 NOTE — Plan of Care (Signed)
  Problem: Clinical Measurements: Goal: Ability to maintain clinical measurements within normal limits will improve Outcome: Progressing Goal: Will remain free from infection Outcome: Progressing Goal: Diagnostic test results will improve Outcome: Progressing Goal: Respiratory complications will improve Outcome: Progressing Goal: Cardiovascular complication will be avoided Outcome: Progressing   Problem: Activity: Goal: Risk for activity intolerance will decrease Outcome: Progressing   Problem: Nutrition: Goal: Adequate nutrition will be maintained Outcome: Progressing   Problem: Coping: Goal: Level of anxiety will decrease Outcome: Progressing   Problem: Elimination: Goal: Will not experience complications related to bowel motility Outcome: Progressing Goal: Will not experience complications related to urinary retention Outcome: Progressing   Problem: Pain Managment: Goal: General experience of comfort will improve and/or be controlled Outcome: Progressing   Problem: Safety: Goal: Ability to remain free from injury will improve Outcome: Progressing   Problem: Skin Integrity: Goal: Risk for impaired skin integrity will decrease Outcome: Progressing   Problem: Safety: Goal: Non-violent Restraint(s) Outcome: Progressing

## 2024-03-08 NOTE — Progress Notes (Signed)
 PHARMACY - ANTICOAGULATION CONSULT NOTE  Pharmacy Consult for Heparin   Indication: atrial fibrillation  Allergies[1]  Patient Measurements: Height: 5' 10 (177.8 cm) Weight: 65.8 kg (145 lb 1 oz) IBW/kg (Calculated) : 73 HEPARIN  DW (KG): 65.8  Vital Signs: Temp: 101 F (38.3 C) (02/04 1952) Temp Source: Axillary (02/04 1952) BP: 109/54 (02/04 1345) Pulse Rate: 80 (02/04 1345)  Labs: Recent Labs    03/06/24 0337 03/07/24 0350 03/08/24 0711 03/08/24 2103  HGB 11.5* 11.4* 11.9*  --   HCT 36.4* 36.3* 36.9*  --   PLT 270 280 336  --   HEPARINUNFRC  --   --   --  0.47  CREATININE 1.15 0.79 0.88  --     Estimated Creatinine Clearance: 63.3 mL/min (by C-G formula based on SCr of 0.88 mg/dL).   Medical History: Past Medical History:  Diagnosis Date   Agent orange exposure    Anemia    BPH (benign prostatic hypertrophy)    Cancer (HCC)    basal cell temple   Complication of anesthesia    confusion and combative when waking up from last kidney procedure   Constipation    Depression    Fatigue    d/t Parkinson's   GERD (gastroesophageal reflux disease)    occasional   History of kidney stones    Left ureteral calculus    Lower urinary tract symptoms (LUTS)    Nephrolithiasis    Neuromuscular disorder (HCC)     parkinson's   Orthostatic hypotension    d/t Parkinson's disease   Orthostatic hypotension    Parkinson's disease (HCC)    Peyronie disease    Pneumonia 2018   Renal cyst, left    S/P deep brain stimulator placement    11-29-2012   Seizure (HCC)    pt and wife report that he has had seizures and is now on seizure medicine   Sleep behavior disorder, REM    Wears glasses    Wears hearing aid    BILATERAL-- INTERMITTANT WEARS    Medications:  Patient is not on long term anticoagulation   Assessment: Patient is a 84 yoM admitted for acute metabolic encephalopathy. 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 Patient found to be in afib with RVR on 2/4  AM. On lovenox  ppx - LD 2/4 at [phone]. Pharmacy consulted for heparin  management.   -1st heparin  level 0.47, at goal  Goal of Therapy:  Heparin  level 0.3-0.7 units/ml Monitor platelets by anticoagulation protocol: Yes   Plan:  continue heparin  infusion at 950 units/hr Confirmatory heparin  level in 8 hours Monitor CBC and heparin  daily  Monitor for signs and symptoms of bleeding   Thank you for allowing pharmacy to be a part of this patients care.  [name] Mace RPh 03/08/2024, 10:04 PM        [1]  Allergies Allergen Reactions   Alfuzosin  Other (See Comments)    Hypotension    Rivastigmine Tartrate Nausea And Vomiting     Can tolerate the patch but can not tolerate the pills   Tamsulosin Other (See Comments)    Severe joint pain

## 2024-03-08 NOTE — Consult Note (Addendum)
 "  Cardiology Consultation   Patient ID: [name] [name] MRN: [id number]; DOB: [birth year]  Admit date: 03/03/2024 Date of Consult: 03/08/2024  PCP:  Beverley Louann ONEIDA, MD   [facility] Providers Cardiologist:  Dorn Lesches, MD        Patient Profile: [name] [name] is a 79 y.o. male with a hx of orthostatic hypotension felt due to Parkinson's disease, deep brain stimulator, CNS lymphoma s/p WBRT, BPH, agent orange exposure, anemia, kidney stones, seizures, GERD who is being seen 03/08/2024 for the evaluation of AF RVR at the request of Dr. Cherlyn.  History of Present Illness: [name] [name] remotely saw Dr. Lesches for orthostatic hypotension in setting of Parkinson's disease. At the time he was on an MAOI med that interacted with midodrine , so treated with Florinef  - last OV 2022. Later notes indicate he was on midodrine  though not recently taking per intake MAR (and no longer on MAOI). He has more recently been following with oncology for his CNS lymphoma. Notes indicate palliative has been seeing patient for symptom management and GOC. He has been dealing with UTI and confusion with hallucinations recently. He was admitted 1/30 with worsening AMS, confusion, agitation, lethargy, and weakness. He had also been having staring spells at home and decreased appetite felt to be at risk for aspiration. He was diagnosed with acute metabolic encephalopathy in the setting of pseudomonas UTI. 1 blood culture bottle grew staph saprophyticus and another with acinetobacter lwoffii, managed as contaminants. At times he has not been able to take the oral medications and feeding supplements ordered for him.  This morning he went into AF RVR along with hypotension in the 70s-80s, elevated temp to 99.8. He was given 2L LR, started on IV amiodarone , and is also ordered for expansion of antibiotics to vancomycin . He had order for phenylephrine  though converted to NSR/sinus tach spontaneously on IV amio with subsequent  BP 124/68 so not started. Labs today show elevated WBC compared to yesterday at 12.9, continued mild anemia Hgb 11.9. Otherwise recent TSH OK, admit trops 33-33. CXR/Echo pending. EKG ordered but completed after conversion to NSR. Patient is somnolent but arousable, speaks in barely a slow whisper, difficult to understand. Denies pain. Wife at bedside. Has trunchal rash; wife said he tends to have spotty skin but did not notice before. She also reports he has been having loose stools.   Past Medical History:  Diagnosis Date   Agent orange exposure    Anemia    BPH (benign prostatic hypertrophy)    Cancer (HCC)    basal cell temple   Complication of anesthesia    confusion and combative when waking up from last kidney procedure   Constipation    Depression    Fatigue    d/t Parkinson's   GERD (gastroesophageal reflux disease)    occasional   History of kidney stones    Left ureteral calculus    Lower urinary tract symptoms (LUTS)    Nephrolithiasis    Neuromuscular disorder (HCC)     parkinson's   Orthostatic hypotension    d/t Parkinson's disease   Orthostatic hypotension    Parkinson's disease (HCC)    Peyronie disease    Pneumonia 2018   Renal cyst, left    S/P deep brain stimulator placement    11-29-2012   Seizure (HCC)    pt and wife report that he has had seizures and is now on seizure medicine   Sleep behavior disorder, REM  Wears glasses    Wears hearing aid    BILATERAL-- INTERMITTANT WEARS    Past Surgical History:  Procedure Laterality Date   APPLICATION OF CRANIAL NAVIGATION Left 10/11/2023   Procedure: COMPUTER-ASSISTED NAVIGATION, FOR CRANIAL PROCEDURE;  Surgeon: Lanis Pupa, MD;  Location: [facility];  Service: Neurosurgery;  Laterality: Left;   CATARACT EXTRACTION Bilateral    CHOLECYSTECTOMY  12/21/2014   Procedure: LAPAROSCOPIC CHOLECYSTECTOMY;  Surgeon: Lynda Leos, MD;  Location: [facility];  Service: General;;   COLONOSCOPY     CYSTO/  LEFT  URETEROSOCPY/   LEFT URETERAL STENT PLACEMENT/  LASER BLADDER STONES AND EXTRACTION  09/25/2010   CYSTOSCOPY WITH INSERTION OF UROLIFT N/A 03/11/2017   Procedure: CYSTOSCOPY WITH INSERTION OF UROLIFT, LITHOPAXY,STONE OBTAINED;  Surgeon: Matilda Senior, MD;  Location: [facility];  Service: Urology;  Laterality: N/A;   CYSTOSCOPY WITH LITHOLAPAXY N/A 09/12/2020   Procedure: CYSTOSCOPY WITH LITHOLAPAXY, LEFT RETROGRADE PYELOGRAMS, LEFT URETEROSCOPY, AND STENT PLACEMENT;  Surgeon: Cam Morene ORN, MD;  Location: [facility];  Service: Urology;  Laterality: N/A;  REQUESTING 2 HRS   CYSTOSCOPY WITH LITHOLAPAXY Right 07/14/2023   Procedure: CYSTOSCOPY, WITH BLADDER CALCULUS LITHOLAPAXY;  Surgeon: Cam Morene ORN, MD;  Location: [facility];  Service: Urology;  Laterality: Right;  CYSTOLITHOLAPAXY, RIGHT URETEROSCOPY, LASER LITHOTRIPSY, STONE EXTRACTION, RIGHT URETERAL STENT PLACEMENT   CYSTOSCOPY WITH RETROGRADE PYELOGRAM, URETEROSCOPY AND STENT PLACEMENT Left 01/06/2013   Procedure: CYSTOSCOPY WITH RETROGRADE PYELOGRAM, URETEROSCOPY AND STENT PLACEMENT;  Surgeon: Thomasine Oiler, MD;  Location: [facility];  Service: Urology;  Laterality: Left;   CYSTOSCOPY WITH RETROGRADE PYELOGRAM, URETEROSCOPY AND STENT PLACEMENT Left 09/11/2014   Procedure: CYSTOSCOPY WITH LEFT RETROGRADE PYELOGRAM, URETEROSCOPY AND STENT PLACEMENT;  Surgeon: Oliva Oiler, MD;  Location: [facility];  Service: Urology;  Laterality: Left;   CYSTOSCOPY/RETROGRADE/URETEROSCOPY/STONE EXTRACTION WITH BASKET Right 07/14/2023   Procedure: CYSTOSCOPY, WITH CALCULUS REMOVAL USING BASKET;  Surgeon: Cam Morene ORN, MD;  Location: [facility];  Service: Urology;  Laterality: Right;   CYSTOSCOPY/URETEROSCOPY/HOLMIUM LASER/STENT PLACEMENT Right 07/14/2023   Procedure: CYSTOSCOPY/URETEROSCOPY/HOLMIUM LASER/STENT PLACEMENT;  Surgeon: Cam Morene ORN, MD;  Location: [facility];  Service:  Urology;  Laterality: Right;   DEEP BRAIN STIMULATOR PLACEMENT  11/29/2012   genertor device at left pectoral area-   EXTRACORPOREAL SHOCK WAVE LITHOTRIPSY Left 10-11-2010  &  01-05-2011   HOLMIUM LASER APPLICATION Left 01/06/2013   Procedure: HOLMIUM LASER APPLICATION;  Surgeon: Thomasine Oiler, MD;  Location: [facility];  Service: Urology;  Laterality: Left;   HOLMIUM LASER APPLICATION Left 09/11/2014   Procedure: HOLMIUM LASER APPLICATION;  Surgeon: Oliva Oiler, MD;  Location: [facility];  Service: Urology;  Laterality: Left;   HOLMIUM LASER APPLICATION N/A 03/11/2017   Procedure: HOLMIUM LASER APPLICATION;  Surgeon: Matilda Senior, MD;  Location: [facility];  Service: Urology;  Laterality: N/A;   NEGATIVE SLEEP STUDY  2013  per pt   NEPHROLITHOTOMY Right 08/13/2022   Procedure: RIGHT NEPHROLITHOTOMY PERCUTANEOUS;  Surgeon: Cam Morene ORN, MD;  Location: [facility];  Service: Urology;  Laterality: Right;  210 MINUTES   PR DURAL GRAFT SPINAL Left 10/11/2023   Procedure: FRAMELESS STEREOTACTIC BIOPSY;  Surgeon: Lanis Pupa, MD;  Location: [facility];  Service: Neurosurgery;  Laterality: Left;  STEREOTACTIC LEFT BRAIN BX   SP PERC NEPHROSTOMY Left 12/16/2012   STONE EXTRACTION WITH BASKET Left 01/06/2013   Procedure: STONE EXTRACTION WITH BASKET;  Surgeon: Thomasine Oiler, MD;  Location: [facility];  Service: Urology;  Laterality: Left;   TONSILLECTOMY     TRANSURETHRAL RESECTION OF PROSTATE N/A 09/12/2020   Procedure: TRANSURETHRAL RESECTION OF THE PROSTATE (TURP);  Surgeon: Cam Morene ORN, MD;  Location: [facility];  Service: Urology;  Laterality: N/A;     Home Medications:  Prior to Admission medications  Medication Sig Start Date End Date Taking? Authorizing Provider  carbidopa -levodopa  (SINEMET  CR) 50-200 MG tablet Take 1 tablet by mouth at bedtime.   Yes [provider]   carbidopa -levodopa  (SINEMET  IR) 25-100 MG tablet 2-2.5 tablets 4 times per day at 6am/10:30am/2pm/6pm Patient taking differently: Take 2 tablets by mouth 4 (four) times daily. Take at 6am/10:30am/2pm/6pm 09/20/23  Yes Johnson, Clanford L, MD  cephALEXin  (KEFLEX ) 500 MG capsule Take 500 mg by mouth every 12 (twelve) hours. 02/22/24  Yes [provider]  cyanocobalamin  1000 MCG tablet Take 1 tablet (1,000 mcg total) by mouth daily. Patient taking differently: Take 1,000 mcg by mouth 3 (three) times a week. 07/11/23  Yes Rizwan, Saima, MD  lamoTRIgine  (LAMICTAL ) 100 MG tablet Take 1 tablet (100 mg total) by mouth 2 (two) times daily. 01/03/24  Yes Vaslow, Zachary K, MD  QUEtiapine  (SEROQUEL ) 25 MG tablet Take 1 tablet (25 mg total) by mouth at bedtime. 03/01/24  Yes Pickenpack-Cousar, Fannie SAILOR, NP  traZODone  (DESYREL ) 50 MG tablet Take 2 tablets (100 mg total) by mouth at bedtime. 12/11/23  Yes Geofm Delon BRAVO, NP  fluticasone  (FLONASE ) 50 MCG/ACT nasal spray Place 2 sprays into both nostrils daily. Patient not taking: Reported on 11/29/2023 10/26/23   Leath-Warren, Etta PARAS, NP  midodrine  (PROAMATINE ) 5 MG tablet Take 1 tablet (5 mg total) by mouth 3 (three) times daily with meals. Patient not taking: Reported on 09/28/2023 08/30/23   Tat, Asberry RAMAN, DO  ondansetron  (ZOFRAN -ODT) 8 MG disintegrating tablet Take 1 tablet (8 mg total) by mouth every 8 (eight) hours as needed for nausea or vomiting. Patient not taking: No sig reported 12/14/23   Izell Domino, MD  pantoprazole  (PROTONIX ) 40 MG tablet TAKE 1 TABLET BY MOUTH EVERY DAY Patient not taking: Reported on 03/04/2024 11/22/23   Vaslow, Zachary K, MD  traZODone  (DESYREL ) 50 MG tablet TAKE 1 TABLET BY MOUTH EVERYDAY AT BEDTIME Patient not taking: Reported on 03/04/2024 12/13/23   Vaslow, Zachary K, MD    Scheduled Meds:  carbidopa -levodopa   1 tablet Oral QHS   carbidopa -levodopa   2 tablet Oral QID   Chlorhexidine  Gluconate Cloth  6 each  Topical Daily   enoxaparin  (LOVENOX ) injection  40 mg Subcutaneous Q24H   feeding supplement  237 mL Oral BID BM   lamoTRIgine   100 mg Oral BID   Continuous Infusions:  sodium chloride  40 mL/hr at 03/07/24 2256   sodium chloride  250 mL (03/08/24 0952)   amiodarone  60 mg/hr (03/08/24 0959)   Followed by   amiodarone      ceFEPime  (MAXIPIME ) IV 2 g (03/08/24 0951)   phenylephrine  (NEO-SYNEPHRINE) Adult infusion     potassium PHOSPHATE  IVPB (in mmol)     vancomycin      vancomycin      PRN Meds: acetaminophen  **OR** acetaminophen , LORazepam   Allergies:   Allergies[1]  Social History:   Social History   Socioeconomic History   Marital status: Married    Spouse name: Not on file   Number of children: Not on file   Years of education: Not on file   Highest education level: Not on file  Occupational History   Not on file  Tobacco Use   Smoking status: Never   Smokeless tobacco: Never  Vaping Use   Vaping status: Never Used  Substance and Sexual Activity   Alcohol  use: Not Currently    Comment: rarely beer   Drug use: No   Sexual activity: Not Currently  Other Topics Concern   Not on file  Social History Narrative   Not on file   Social Drivers of Health   Tobacco Use: Low Risk (03/03/2024)   Patient History    Smoking Tobacco Use: Never    Smokeless Tobacco Use: Never    Passive Exposure: Not on file  Financial Resource Strain: Not on file  Food Insecurity: No Food Insecurity (03/04/2024)   Epic    Worried About Programme Researcher, Broadcasting/film/video in the Last Year: Never true    Ran Out of Food in the Last Year: Never true  Transportation Needs: No Transportation Needs (03/04/2024)   Epic    Lack of Transportation (Medical): No    Lack of Transportation (Non-Medical): No  Physical Activity: Not on file  Stress: Not on file  Social Connections: Moderately Isolated (03/04/2024)   Social Connection and Isolation Panel    Frequency of Communication with Friends and Family: Three  times a week    Frequency of Social Gatherings with Friends and Family: Three times a week    Attends Religious Services: Never    Active Member of Clubs or Organizations: No    Attends Banker Meetings: Never    Marital Status: Married  Catering Manager Violence: Not At Risk (03/04/2024)   Epic    Fear of Current or Ex-Partner: No    Emotionally Abused: No    Physically Abused: No    Sexually Abused: No  Depression (PHQ2-9): Low Risk (01/03/2024)   Depression (PHQ2-9)    PHQ-2 Score: 0  Alcohol  Screen: Not on file  Housing: Low Risk (03/04/2024)   Epic    Unable to Pay for Housing in the Last Year: No    Number of Times Moved in the Last Year: 0    Homeless in the Last Year: No  Utilities: Not At Risk (03/04/2024)   Epic    Threatened with loss of utilities: No  Health Literacy: Not on file    Family History:    Family History  Problem Relation Age of Onset   Hypertension Mother    Dementia Father    Cancer Father    Heart attack Sister    Arthritis/Rheumatoid Sister      ROS:  Please see the history of present illness.   All other ROS reviewed and negative.     Physical Exam/Data: Vitals:   03/08/24 0840 03/08/24 0845 03/08/24 0850 03/08/24 0909  BP: (!) 91/58 (!) 71/51 (!) 83/50   Pulse:      Resp:  (!) 35 (!) 28 (!) 28  Temp: 98.4 F (36.9 C)   99.8 F (37.7 C)  TempSrc: Axillary   Axillary  SpO2: 95%  96%   Weight:    65.8 kg  Height:        Intake/Output Summary (Last 24 hours) at 03/08/2024 1015 Last data filed at 03/07/2024 2256 Gross per 24 hour  Intake 672 ml  Output --  Net 672 ml      03/08/2024    9:09 AM 03/04/2024    6:20 AM 11/29/2023   11:14 AM  Last 3 Weights  Weight (lbs) 145 lb 1 oz 144 lb 2.9 oz --  Weight (kg) 65.8 kg 65.4 kg --     Body mass index is 20.81 kg/m.  General: Ill appearing elderly WM in no acute distress. Head: Normocephalic, atraumatic, sclera non-icteric, no xanthomas, nares are without  discharge. Neck: Negative for carotid bruits. JVP not elevated. Lungs: Clear bilaterally to auscultation without wheezes, rales, or rhonchi. Breathing is unlabored. Heart: RRR S1 S2 without murmurs, rubs, or gallops.  Abdomen: Soft, non-tender, non-distended with normoactive bowel sounds. No rebound/guarding. Papular rash on trunk Extremities: No clubbing or cyanosis. No edema. Distal pedal pulses are 2+ and equal bilaterally. Neuro: Alert and oriented X 3. Moves all extremities spontaneously. Psych:  Responds to questions appropriately with a normal affect.   EKG:  The EKG was personally reviewed and demonstrates:  03/03/24 sinus tach, nonspecific STTW changes, LAD EKG obtained after converted to NSR today - sinus tach 101bpm, nonspecific STTW changes Telemetry:  Telemetry was personally reviewed and demonstrates:  AF RVR then NSR/borderline sinus tach  Relevant CV Studies: Echo pending  Laboratory Data: High Sensitivity Troponin:  No results for input(s): TROPONINIHS in the last 720 hours.  Recent Labs  Lab 03/03/24 1630 03/03/24 1823  TRNPT 33* 33*      Chemistry Recent Labs  Lab 03/06/24 0337 03/07/24 0350 03/08/24 0711  NA 146* 145 145  K 3.9 3.4* 3.6  CL 114* 112* 112*  CO2 24 24 23   GLUCOSE 101* 85 93  BUN 23 19 15   CREATININE 1.15 0.79 0.88  CALCIUM  8.6* 8.4* 8.4*  MG 2.2 2.1 2.0  GFRNONAA >60 >60 >60  ANIONGAP 8 9 11     Recent Labs  Lab 03/03/24 1630  PROT 6.3*  ALBUMIN 3.4*  AST 29  ALT <5  ALKPHOS 80  BILITOT 0.7   Lipids No results for input(s): CHOL, TRIG, HDL, LABVLDL, LDLCALC, CHOLHDL in the last 168 hours.  Hematology Recent Labs  Lab 03/06/24 0337 03/07/24 0350 03/08/24 0711  WBC 11.1* 10.8* 12.9*  RBC 3.70* 3.67* 3.82*  HGB 11.5* 11.4* 11.9*  HCT 36.4* 36.3* 36.9*  MCV 98.4 98.9 96.6  MCH 31.1 31.1 31.2  MCHC 31.6 31.4 32.2  RDW 13.2 12.9 12.9  PLT 270 280 336   Thyroid   Recent Labs  Lab 03/04/24 0111  TSH 0.809     BNPNo results for input(s): BNP, PROBNP in the last 168 hours.  DDimer No results for input(s): DDIMER in the last 168 hours.  Radiology/Studies:  US  EKG SITE RITE Result Date: 03/08/2024 If Site Rite image not attached, placement could not be confirmed due to current cardiac rhythm.  EEG adult Result Date: 03/06/2024 Shelton Arlin KIDD, MD     03/06/2024  3:47 PM Patient Name: [name] [name] MRN: [id number] Epilepsy Attending: Arlin KIDD Shelton Referring Physician/Provider: Alfornia Madison, MD Date: 03/06/2024 Duration: 32.13 mins Patient history: 79yo M with ams. EEG to evaluate for seizure Level of alertness: Awake AEDs during EEG study: LTG Technical aspects: This EEG study was done with scalp electrodes positioned according to the 10-20 International system of electrode placement. Electrical activity was reviewed with band pass filter of 1-70Hz , sensitivity of 7 uV/mm, display speed of 11mm/sec with a 60Hz  notched filter applied as appropriate. EEG data were recorded continuously and digitally stored.  Video monitoring was available and reviewed as appropriate. Description: The posterior dominant rhythm consists of 7Hz  activity of moderate voltage (25-35 uV) seen predominantly in posterior head regions, symmetric and reactive to eye opening and eye closing. EEG showed continuous generalized predominantly 5  to 7 Hz theta slowing admixed with intermittent 2-3hz  delta slowing. Hyperventilation and photic stimulation were not performed.    ABNORMALITY - Continuous slow, generalized  IMPRESSION: This study is suggestive of generalized cerebral dysfunction ( encephalopathy). No seizures or epileptiform discharges were seen throughout the recording.  Priyanka O Yadav     Assessment and Plan:  1. New onset atrial fibrillation with RVR and associated hypotension - occurring in setting of multiple physiologic stressors including concern for developing sepsis with low grade temp elevation, increasing  leukocytosis, hypotension - started on IV amio with reversion to NSR/low grade sinus tach - would continue for now while critically ill - will review anticoag with MD given short duration; reviewed with neuro-onc who feels OAC could be used in setting of CNS lymphoma - patient has not been taking his oral medications reliably so will need to consider GOC in discussions as well - avoid BB/CCB given hypotension - TSH normal - echo pending - treat underlying illness - lyte mgmt per primary team, would try to keep K 4.0 or greater - addendum: per preliminary discussion with Dr. Floretta, start heparin  per pharmacy  2. Mildly elevated troponin earlier in admsission - low/flat, do not suspect ACS - would not pursue additional ischemic workup at this time  3. Orthostatic hypotension - chronic in setting of Parkinson's - manage in context above  Remainder per primary, also has ? truncal rash  Risk Assessment/Risk Scores:         CHA2DS2-VASc Score = 3   This indicates a 3.2% annual risk of stroke. The patient's score is based upon: CHF History: 0 HTN History: 0 Diabetes History: 0 Stroke History: 0 Vascular Disease History: 1 (aortic atherosclerosis) Age Score: 2 Gender Score: 0      For questions or updates, please contact [facility] Please consult [email or web address] for contact info under      Signed, Nakeeta Sebastiani N Jary Louvier, PA-C  03/08/2024 10:15 AM     [1]  Allergies Allergen Reactions   Alfuzosin  Other (See Comments)    Hypotension    Rivastigmine Tartrate Nausea And Vomiting     Can tolerate the patch but can not tolerate the pills   Tamsulosin Other (See Comments)    Severe joint pain   "

## 2024-03-08 NOTE — Progress Notes (Signed)
 Wife requested to have Seroquel  held due to patient no longer being agitated and she reached out to her sister for second opinion. Her sister told her it will make him sleepy and wife stated she did not want him too drowsy, as she said he was just coming too from a previous medication he was given that made him drowsy.

## 2024-03-08 NOTE — Progress Notes (Addendum)
 Initial Nutrition Assessment  DOCUMENTATION CODES:   Severe malnutrition in context of chronic illness  INTERVENTION:   CorTrak placed today.Once tube X-ray verified in the stomach:  Initiate tube feeding via [employer] 1.4 at 65 ml/h (1560 ml per day) Start at 20 and advance by 10 mL every 12 hours to reach goal Free water : 200 mL every 6 hours Provides 2184 kcal, 96 gm protein, 1908 ml free water  daily (Tf + flushes)   Pt is at risk for refeeding syndrome given severe malnutrition. Monitor magnesium  and phosphorus daily x 3 days, MD to replete as needed. 100mg  thiamine  x 5 days   Will monitor for diet advancement.   NUTRITION DIAGNOSIS:   Severe Malnutrition related to chronic illness (CNS lymphoma s/p WBRT) as evidenced by percent weight loss, severe muscle depletion, moderate fat depletion.   GOAL:   Patient will meet greater than or equal to 90% of their needs   MONITOR:   TF tolerance, Weight trends, Diet advancement, Labs  REASON FOR ASSESSMENT:   Consult Assessment of nutrition requirement/status  ASSESSMENT:   PMH: CNS lymphoma s/p WBRT, seizures, Parkinson's, HTN, UTI, GERD Admitted with: acute metabolic encephalopathy Presented with: AMS, agitation  09/2023- CNS Lymphoma dx 12/2023- radiation tx began 1/30- pt admitted 2/3- SLP BSS- NPO  2/4- CorTrak placed   Pt was lying in bed sedated at the time of interview. Wife reported nutrition hx. Pt got dx with CNS lymphoma in August 2025 and has been getting radiation from a [place] [facility]. Prior to dx he had a great appetite. He would eat 2 meals a day on a vegetarian diet. Meals would consist of a Boost protein shake in the morning and then sweet grits and eggs for breakfast and then an evening meal consisting of 2 entrees at a restaurant and a salad, and then sometimes another Boost protein shake. Wife reports when pt went on steroids, his appetite decreased and as he was getting lower doses his appetite  came back. PTA pt had a sweet tooth and was eating eat at baseline however, over the past week pt has limited appetite and taste for favorite foods. Wife stated pt has had only bites of food since Thursday 1/29.   SLP examined aspiration upon swallowing and determined the pt was not safe to consume anything po. A CorTrak was suggested and placed today. Discussed plans for TF with pts wife. Given that he is a vegetarian and tolerates limited amounts of milk products, will plan to utilize plant based [employer] formula. Wife also reports they do not like to use animal products so will need to avoid using [employer] which uses Beef collagen.   Pt bowels are moving. Pt is unable to ambulate without assistance.   Wife reported UBW of 70 kg. Pt wt upon admission was 65.4 kg, back in August pt weighed 71kg. Resulting in a 8% wt loss x 5 months, which is clinically significant for timeframe.   Admit weight: 65.4 kg  Current weight: 65.8 kg     Average Meal Intake: 2/1-2/2: 0% intake x 5 recorded meals  Nutritionally Relevant Medications: Scheduled Meds:  feeding supplement  237 mL Oral BID BM   pantoprazole  (PROTONIX ) IV  40 mg Intravenous Daily   Continuous Infusions:  sodium chloride  10 mL/hr at 03/08/24 1149   lactated ringers      potassium PHOSPHATE  IVPB (in mmol) 30 mmol (03/08/24 1157)   vancomycin       Labs Reviewed: Ca: 8.4 Phosphorus:  2.4  Lactic acid 2.5   NUTRITION - FOCUSED PHYSICAL EXAM:  Flowsheet Row Most Recent Value  Orbital Region Moderate depletion  Upper Arm Region Unable to assess  [restraints]  Thoracic and Lumbar Region Moderate depletion  Buccal Region Severe depletion  Temple Region Severe depletion  Clavicle Bone Region Mild depletion  Clavicle and Acromion Bone Region Moderate depletion  Scapular Bone Region Unable to assess  Dorsal Hand Unable to assess  [mitts]  Patellar Region Severe depletion  Anterior Thigh Region Severe depletion  Posterior Calf  Region Severe depletion  Edema (RD Assessment) None  Hair Reviewed  Eyes Unable to assess  Mouth Unable to assess  Skin Reviewed  Nails Reviewed    Diet Order:   Diet Order             Diet NPO time specified  Diet effective now                   EDUCATION NEEDS:   Not appropriate for education at this time  Skin:  Skin Assessment: Reviewed RN Assessment  Last BM:  2/4- type 6  Height:   Ht Readings from Last 1 Encounters:  03/08/24 5' 10 (1.778 m)    Weight:   Wt Readings from Last 1 Encounters:  03/08/24 65.8 kg    Ideal Body Weight:  75 kg  BMI:  Body mass index is 20.81 kg/m.  Estimated Nutritional Needs:   Kcal:  2000-2300  Protein:  95-110 g  Fluid:  2.0 L    Con Friends, Dietetic Intern

## 2024-03-08 NOTE — Consult Note (Signed)
 "  NAME:  [name] [name], MRN:  [id number], DOB:  12/06/1945, LOS: 4 ADMISSION DATE:  03/03/2024, CONSULTATION DATE:  03/08/2024 REFERRING MD:  Cherlyn Labella MD, CHIEF COMPLAINT:  Afib RVR w/ hypotension   History of Present Illness:  Patient is a 79 y.o. male w/ a PMH significant for primary CNS lymphoma s/p WBRT, parkinson, seizures, s/p deep brain stimulator, Orthostatic hypotension, depression, and GERD who presented to [facility] on 1/30 for AMS with suspicion of symptoms attributed to UTI dx 10 days prior s/p 10 day course of keflex . He was recently seen recently by palliative for worsening confusion and hallucination which he was started on Seroquel . He also by Dr. [name] who tapered off his steroids in December. This admission patient has been on treatment for pseudomonas UTI with metabolic encephalopathy with ABX stopped 2/3.  1 blood culture bottle grew staph saprophyticus and another with acinetobacter lwoffii although these were assumed contaminants with repeat BC NGTD x4 days. Additionally, wife states patient has had ongoing episodes of diarrhea and poor intake d/t concerns for aspiration, he was scheduled for barium swallow while in patient.   Consult was placed to day for patients ongoing Afib RVR with hypotension. This morning patient went into Afib RVR with subsequent hypotension with systolic 70-80s. He was given 1L, an Amio bolus and Amio gtt started at the time of our arrival with ongoing Afib HR in 160s SBP 80s. Order for an additional Amio bolus was given and the second liter of LR was infused. Patient was placed on septic protocol and started on Cefepime  & Vanc. By the hospitalist. Neo order placed for hypotension. After 2L LR infusion patient converted back to NSR/ST HR 98-105 with SBP in 110s. Patient was never started on Neo since converting back to SR. Patient now appears clinically stable on Amio gtt.   Patient is a poor historian, HPI obtained from chart review and wife at bedside.    Pertinent  Medical History   Past Medical History:  Diagnosis Date   Agent orange exposure    Anemia    BPH (benign prostatic hypertrophy)    Cancer (HCC)    basal cell temple   Complication of anesthesia    confusion and combative when waking up from last kidney procedure   Constipation    Depression    Fatigue    d/t Parkinson's   GERD (gastroesophageal reflux disease)    occasional   History of kidney stones    Left ureteral calculus    Lower urinary tract symptoms (LUTS)    Nephrolithiasis    Neuromuscular disorder (HCC)     parkinson's   Orthostatic hypotension    d/t Parkinson's disease   Orthostatic hypotension    Parkinson's disease (HCC)    Peyronie disease    Pneumonia 2018   Renal cyst, left    S/P deep brain stimulator placement    11-29-2012   Seizure (HCC)    pt and wife report that he has had seizures and is now on seizure medicine   Sleep behavior disorder, REM    Wears glasses    Wears hearing aid    BILATERAL-- INTERMITTANT WEARS     Significant Hospital Events: Including procedures, antibiotic start and stop dates in addition to other pertinent events   2/4 Afib RVR with hypotension requiring 2 amio bolus and gtt, converted after 2L LR bolus and Amio  Interim History / Subjective:  Patient mumbling/incoherent, lying in bed. Per wife this is similar to  how he has been for multiple days.  Objective   Blood pressure (!) 83/50, pulse (!) 168, temperature 99.8 F (37.7 C), temperature source Axillary, resp. rate (!) 28, height 5' 10 (1.778 m), weight 65.8 kg, SpO2 96%.        Intake/Output Summary (Last 24 hours) at 03/08/2024 1106 Last data filed at 03/08/2024 1031 Gross per 24 hour  Intake 2752.51 ml  Output --  Net 2752.51 ml   Filed Weights   03/04/24 0620 03/08/24 0909  Weight: 65.4 kg 65.8 kg    Examination: General: ill appearing, no acute distress, multiple scattered wounds noted HENT: normocephalic, trachea midline, mucous  membranes dry Lungs: bilaterally LCTA, No labored breathing noted, tachypnea noted, No wheezing or rhonchi appreciated Cardiovascular: S1 & S2 appreciated, no murmurs, rubs, or gallops, irregular rhythm, no JVP, +2 pulses  BUE/BLE Abdomen: abdomen soft, nontender, normo active bowel sounds Extremities: no edema BUE/BLE, no cyanosis Neuro: Orientated x1, mumbled incoherent speech. Moves all extremities GU: purewick noted with amber [facility] Problem list     Assessment & Plan:  # New onset Afib RVR w/ associated hypotension # Possible Sepsis r/t pseudomonas UTI # Concern for Aspiration PNA Patient with new onset Afib RVR up to 180s with associated hypotension likely in setting of combined factors of hypovolemia, possible sepsis, and new onset Afib RVR. Concern for sepsis noted with intermittent fevering,  mild leukocytosis, tachypnea and tachycardia prior to afib event. Patient also has a concern for aspiration PNA developing with reports of coughing while eating and pocketing pills per wife and decreased mental status. Additionally concern for hypovolemia given poor PO intake and recent episodes of diarrhea.  - Second Amio bolus given during Afib episode, would suggest continuing Amio gtt for now  - Start on 75ml/hr of IVF for ongoing fluid resuscitation while NPO - Given episode of Afib was brief and now in NSR, would delay anticoagulation for now. Could reconsider if patient has additional afib episodes. CHA2DS2VASc score of 3 (vasc disease and age) - Given recent steriod taper , cortisol level checked to assess for adrenal insufficiency - TSH normal, potassium (3.6) and phos replaced, magnesium  WNL, Lactic pending. Goal Potassium >4 - Echo pending - Neo additionally ordered during acute hypotension but was never started & was d/c given normotensive state now - Agree with treatment of possible underlying sepsis with continuing Cefepime  & Vanc as well as BC repeat. - CXR with  increased infiltrates, differential of vascular congestion vs infectious process  - PICC placement d/t poor IV access and multiple incompatible IV medications - Cards consulted, appreciate recs   #Orthostatic hypotension Patient has been off midodrine , chronic OH. BP normotensive now that patient has converted.   No ICU needs at this time, please reach out to PCCM if needed  Labs   CBC: Recent Labs  Lab 03/03/24 1630 03/04/24 0814 03/05/24 0428 03/06/24 0337 03/07/24 0350 03/08/24 0711  WBC 15.3* 20.1* 17.3* 11.1* 10.8* 12.9*  NEUTROABS 13.2*  --   --   --   --   --   HGB 13.2 12.1* 11.1* 11.5* 11.4* 11.9*  HCT 41.2 38.9* 35.4* 36.4* 36.3* 36.9*  MCV 97.9 100.3* 101.7* 98.4 98.9 96.6  PLT 345 321 282 270 280 336    Basic Metabolic Panel: Recent Labs  Lab 03/03/24 1630 03/05/24 0428 03/06/24 0337 03/07/24 0350 03/08/24 0711  NA 143 145 146* 145 145  K 4.0 3.1* 3.9 3.4* 3.6  CL 106 111 114*  112* 112*  CO2 26 26 24 24 23   GLUCOSE 118* 109* 101* 85 93  BUN 28* 23 23 19 15   CREATININE 1.19 0.98 1.15 0.79 0.88  CALCIUM  9.3 8.6* 8.6* 8.4* 8.4*  MG 2.3 2.1 2.2 2.1 2.0  PHOS  --  2.4* 3.4 2.0* 2.4*   GFR: Estimated Creatinine Clearance: 63.3 mL/min (by C-G formula based on SCr of 0.88 mg/dL). Recent Labs  Lab 03/05/24 0428 03/06/24 0337 03/07/24 0350 03/08/24 0711  WBC 17.3* 11.1* 10.8* 12.9*    Liver Function Tests: Recent Labs  Lab 03/03/24 1630  AST 29  ALT <5  ALKPHOS 80  BILITOT 0.7  PROT 6.3*  ALBUMIN 3.4*   Recent Labs  Lab 03/03/24 1630  LIPASE 22   Recent Labs  Lab 03/04/24 0111  AMMONIA 33    ABG    Component Value Date/Time   HCO3 27.2 03/03/2024 1630   O2SAT 45 03/03/2024 1630     Coagulation Profile: No results for input(s): INR, PROTIME in the last 168 hours.  Cardiac Enzymes: No results for input(s): CKTOTAL, CKMB, CKMBINDEX, TROPONINI in the last 168 hours.  HbA1C: Hgb A1c MFr Bld  Date/Time Value Ref  Range Status  09/17/2023 04:54 AM 5.0 4.8 - 5.6 % Final    Comment:    (NOTE) Diagnosis of Diabetes The following HbA1c ranges recommended by the American Diabetes Association (ADA) may be used as an aid in the diagnosis of diabetes mellitus.  Hemoglobin             Suggested A1C NGSP%              Diagnosis  <5.7                   Non Diabetic  5.7-6.4                Pre-Diabetic  >6.4                   Diabetic  <7.0                   Glycemic control for                       adults with diabetes.      CBG: Recent Labs  Lab 03/03/24 1935  GLUCAP 95    Review of Systems:   As above   Past Medical History:  He,  has a past medical history of Agent orange exposure, Anemia, BPH (benign prostatic hypertrophy), Cancer (HCC), Complication of anesthesia, Constipation, Depression, Fatigue, GERD (gastroesophageal reflux disease), History of kidney stones, Left ureteral calculus, Lower urinary tract symptoms (LUTS), Nephrolithiasis, Neuromuscular disorder (HCC), Orthostatic hypotension, Orthostatic hypotension, Parkinson's disease (HCC), Peyronie disease, Pneumonia (2018), Renal cyst, left, S/P deep brain stimulator placement, Seizure (HCC), Sleep behavior disorder, REM, Wears glasses, and Wears hearing aid.   Surgical History:   Past Surgical History:  Procedure Laterality Date   APPLICATION OF CRANIAL NAVIGATION Left 10/11/2023   Procedure: COMPUTER-ASSISTED NAVIGATION, FOR CRANIAL PROCEDURE;  Surgeon: Lanis Pupa, MD;  Location: [facility] OR;  Service: Neurosurgery;  Laterality: Left;   CATARACT EXTRACTION Bilateral    CHOLECYSTECTOMY  12/21/2014   Procedure: LAPAROSCOPIC CHOLECYSTECTOMY;  Surgeon: Lynda Leos, MD;  Location: [facility] OR;  Service: General;;   COLONOSCOPY     CYSTO/  LEFT URETEROSOCPY/   LEFT URETERAL STENT PLACEMENT/  LASER BLADDER STONES AND EXTRACTION  09/25/2010   CYSTOSCOPY WITH INSERTION  OF UROLIFT N/A 03/11/2017   Procedure: CYSTOSCOPY WITH INSERTION OF  UROLIFT, LITHOPAXY,STONE OBTAINED;  Surgeon: Matilda Senior, MD;  Location: [facility];  Service: Urology;  Laterality: N/A;   CYSTOSCOPY WITH LITHOLAPAXY N/A 09/12/2020   Procedure: CYSTOSCOPY WITH LITHOLAPAXY, LEFT RETROGRADE PYELOGRAMS, LEFT URETEROSCOPY, AND STENT PLACEMENT;  Surgeon: Cam Morene ORN, MD;  Location[facility];  Service: Urology;  Laterality: N/A;  REQUESTING 2 HRS   CYSTOSCOPY WITH LITHOLAPAXY Right 07/14/2023   Procedure: CYSTOSCOPY, WITH BLADDER CALCULUS LITHOLAPAXY;  Surgeon: Cam Morene ORN, MD;  Location: [facility];  Service: Urology;  Laterality: Right;  CYSTOLITHOLAPAXY, RIGHT URETEROSCOPY, LASER LITHOTRIPSY, STONE EXTRACTION, RIGHT URETERAL STENT PLACEMENT   CYSTOSCOPY WITH RETROGRADE PYELOGRAM, URETEROSCOPY AND STENT PLACEMENT Left 01/06/2013   Procedure: CYSTOSCOPY WITH RETROGRADE PYELOGRAM, URETEROSCOPY AND STENT PLACEMENT;  Surgeon: Thomasine Oiler, MD;  Location: [facility];  Service: Urology;  Laterality: Left;   CYSTOSCOPY WITH RETROGRADE PYELOGRAM, URETEROSCOPY AND STENT PLACEMENT Left 09/11/2014   Procedure: CYSTOSCOPY WITH LEFT RETROGRADE PYELOGRAM, URETEROSCOPY AND STENT PLACEMENT;  Surgeon: Oliva Oiler, MD;  Location: [facility];  Service: Urology;  Laterality: Left;   CYSTOSCOPY/RETROGRADE/URETEROSCOPY/STONE EXTRACTION WITH BASKET Right 07/14/2023   Procedure: CYSTOSCOPY, WITH CALCULUS REMOVAL USING BASKET;  Surgeon: Cam Morene ORN, MD;  Location: [facility];  Service: Urology;  Laterality: Right;   CYSTOSCOPY/URETEROSCOPY/HOLMIUM LASER/STENT PLACEMENT Right 07/14/2023   Procedure: CYSTOSCOPY/URETEROSCOPY/HOLMIUM LASER/STENT PLACEMENT;  Surgeon: Cam Morene ORN, MD;  Location: [facility];  Service: Urology;  Laterality: Right;   DEEP BRAIN STIMULATOR PLACEMENT  11/29/2012   genertor device at left pectoral area-   EXTRACORPOREAL SHOCK WAVE LITHOTRIPSY Left 10-11-2010  &  01-05-2011    HOLMIUM LASER APPLICATION Left 01/06/2013   Procedure: HOLMIUM LASER APPLICATION;  Surgeon: Thomasine Oiler, MD;  Location: [facility];  Service: Urology;  Laterality: Left;   HOLMIUM LASER APPLICATION Left 09/11/2014   Procedure: HOLMIUM LASER APPLICATION;  Surgeon: Oliva Oiler, MD;  Location: [facility];  Service: Urology;  Laterality: Left;   HOLMIUM LASER APPLICATION N/A 03/11/2017   Procedure: HOLMIUM LASER APPLICATION;  Surgeon: Matilda Senior, MD;  Location: [facility];  Service: Urology;  Laterality: N/A;   NEGATIVE SLEEP STUDY  2013  per pt   NEPHROLITHOTOMY Right 08/13/2022   Procedure: RIGHT NEPHROLITHOTOMY PERCUTANEOUS;  Surgeon: Cam Morene ORN, MD;  Location: [facility];  Service: Urology;  Laterality: Right;  210 MINUTES   PR DURAL GRAFT SPINAL Left 10/11/2023   Procedure: FRAMELESS STEREOTACTIC BIOPSY;  Surgeon: Lanis Pupa, MD;  Location: [facility];  Service: Neurosurgery;  Laterality: Left;  STEREOTACTIC LEFT BRAIN BX   SP PERC NEPHROSTOMY Left 12/16/2012   STONE EXTRACTION WITH BASKET Left 01/06/2013   Procedure: STONE EXTRACTION WITH BASKET;  Surgeon: Thomasine Oiler, MD;  Location: [facility];  Service: Urology;  Laterality: Left;   TONSILLECTOMY     TRANSURETHRAL RESECTION OF PROSTATE N/A 09/12/2020   Procedure: TRANSURETHRAL RESECTION OF THE PROSTATE (TURP);  Surgeon: Cam Morene ORN, MD;  Location: [facility];  Service: Urology;  Laterality: N/A;     Social History:   reports that he has never smoked. He has never used smokeless tobacco. He reports that he does not currently use alcohol . He reports that he does not use drugs.   Family History:  His family history includes Arthritis/Rheumatoid in his sister; Cancer in his father; Dementia in his father; Heart attack in his sister; Hypertension in his mother.  Allergies Allergies[1]   Home Medications  Prior to Admission  medications  Medication Sig Start Date End Date Taking? Authorizing Provider  carbidopa -levodopa  (SINEMET  CR) 50-200 MG tablet Take 1 tablet by mouth at bedtime.   Yes [provider]  carbidopa -levodopa  (SINEMET  IR) 25-100 MG tablet 2-2.5 tablets 4 times per day at 6am/10:30am/2pm/6pm Patient taking differently: Take 2 tablets by mouth 4 (four) times daily. Take at 6am/10:30am/2pm/6pm 09/20/23  Yes Johnson, Clanford L, MD  cephALEXin  (KEFLEX ) 500 MG capsule Take 500 mg by mouth every 12 (twelve) hours. 02/22/24  Yes [provider]  cyanocobalamin  1000 MCG tablet Take 1 tablet (1,000 mcg total) by mouth daily. Patient taking differently: Take 1,000 mcg by mouth 3 (three) times a week. 07/11/23  Yes Rizwan, Saima, MD  lamoTRIgine  (LAMICTAL ) 100 MG tablet Take 1 tablet (100 mg total) by mouth 2 (two) times daily. 01/03/24  Yes Vaslow, Zachary K, MD  QUEtiapine  (SEROQUEL ) 25 MG tablet Take 1 tablet (25 mg total) by mouth at bedtime. 03/01/24  Yes Pickenpack-Cousar, Fannie SAILOR, NP  traZODone  (DESYREL ) 50 MG tablet Take 2 tablets (100 mg total) by mouth at bedtime. 12/11/23  Yes Geofm Delon BRAVO, NP  fluticasone  (FLONASE ) 50 MCG/ACT nasal spray Place 2 sprays into both nostrils daily. Patient not taking: Reported on 11/29/2023 10/26/23   Leath-Warren, Etta PARAS, NP  midodrine  (PROAMATINE ) 5 MG tablet Take 1 tablet (5 mg total) by mouth 3 (three) times daily with meals. Patient not taking: Reported on 09/28/2023 08/30/23   Tat, Asberry RAMAN, DO  ondansetron  (ZOFRAN -ODT) 8 MG disintegrating tablet Take 1 tablet (8 mg total) by mouth every 8 (eight) hours as needed for nausea or vomiting. Patient not taking: No sig reported 12/14/23   Izell Domino, MD  pantoprazole  (PROTONIX ) 40 MG tablet TAKE 1 TABLET BY MOUTH EVERY DAY Patient not taking: Reported on 03/04/2024 11/22/23   Vaslow, Zachary K, MD  traZODone  (DESYREL ) 50 MG tablet TAKE 1 TABLET BY MOUTH EVERYDAY AT BEDTIME Patient not taking:  Reported on 03/04/2024 12/13/23   Vaslow, Zachary K, MD     Critical care time: The patient is critically ill with multiple organ system failure and requires high complexity decision making for assessment and support, frequent evaluation and titration of therapies, advanced monitoring, review of radiographic studies and interpretation of complex data.    Critical Care Time devoted to patient care services, exclusive of separately billable procedures, described in this note is n/a     Alveria DELENA Shoulder, NP [facility] 03/08/24 12:14 PM  Please see [email or web address] for pager details  From 7A-7P. If no response, please call [phone] After hours, please call Elink: [phone]         [1]  Allergies Allergen Reactions   Alfuzosin  Other (See Comments)    Hypotension    Rivastigmine Tartrate Nausea And Vomiting     Can tolerate the patch but can not tolerate the pills   Tamsulosin Other (See Comments)    Severe joint pain   "

## 2024-03-08 NOTE — Progress Notes (Signed)
 "        [facility]                                                                               Kiel Cockerell, is a 79 y.o. male, DOB - [birth year], FMW:[id number] Admit date - 03/03/2024    Outpatient Primary MD for the patient is Beverley Parry T, MD  LOS - 4  days    Brief summary    79 y.o. male with PMH significant for primary CNS lymphoma status post WBRT, seizures, Parkinson's disease, orthostatic hypotension, BPH, depression, GERD.  He has recently seen  palliative care on 1/22 for increased confusion / hallucinations and impaired mobility.  His symptoms were attributed to urinary tract infection and he was prescribed a 10-day course of Keflex .  He was recently started on Seroquel  2 days ago for hallucinations and sleep disturbance.  Dr. Vaslow has recently tapered off his steroids.  Patient presented in the ED further evaluation of altered mental status / confusion / agitation.   UA with negative nitrite, large leukocytes, 21-50 RBCs, >50 WBCs, and no bacteria. Urine culture blood cultures were obtained. Chest x-ray no acute cardiopulmonary abnormality. CT head without any acute intracranial finding. Wife reports patient has multiple falls at home.   Assessment & Plan    Assessment and Plan:  Atrial fibrillation with RVR and hypotension:  In the setting of possible sepsis from UTI/ Sepsis/possible aspiration pneumonia Patient's heart rate was between 140 to 160/min earlier today along with hypotension. EKG shows new onset atrial fibrillation with RVR Patient was started on amiodarone  bolus, and gtt. along with 2 L of Ringer lactate bolus. Cardiology consulted, echocardiogram ordered. Repeat blood cultures and started the patient on broad-spectrum IV antibiotics. CHA2DS2-VASc Score = 3 , was started on IV heparin . TSH wnl.      Pseudomonas urinary tract infection Restart the patient on IV cefepime    Acinetobacter and staph saprophyticus blood cultures from January  30 Initially thought to be a contaminant.  Repeat blood cultures and started the patient on broad-spectrum IV antibiotics.    Acute metabolic encephalopathy in the setting of progressive neurological disorder, Parkinson's disease and CNS lymphoma s/p whole brain radiation and  seizures Patient currently minimally responsive, able to recognize his wife. He has poor oral intake currently n.p.o. for  SLP evaluation. He is on carbidopa  levodopa  and the Lamictal .   Hypophosphatemia Replaced with potassium phosphate     Poor oral intake/cachexia Nutrition on board     History of primary CNS lymphoma status post WBRT: Follows with Dr. [name].  He was previously on steroids which have now been tapered off due to concern for altered mental status / Hallucinations.  CT head today showing no acute findings.     Seizures Resume lamictal .   H/o orthostatic hypotension:  Chronic .      Estimated body mass index is 20.81 kg/m as calculated from the following:   Height as of this encounter: 5' 10 (1.778 m).   Weight as of this encounter: 65.8 kg.  Code Status: full code  DVT Prophylaxis:  enoxaparin  (LOVENOX ) injection 40 mg Start: 03/04/24 1000   Level of Care: Level of care:  Stepdown Family Communication: Updated patient's family at bedside.   Disposition Plan:     Remains inpatient appropriate:  pending further work up for afib.    Procedures:  CT head without contrast.  Consultants:   PCCM Cardiology Oncology  Palliative care.   Antimicrobials:   Anti-infectives (From admission, onward)    Start     Dose/Rate Route Frequency Ordered Stop   03/08/24 2200  vancomycin  (VANCOREADY) IVPB 750 mg/150 mL        750 mg 150 mL/hr over 60 Minutes Intravenous Every 12 hours 03/08/24 0912     03/08/24 1000  vancomycin  (VANCOCIN ) IVPB 1000 mg/200 mL premix        1,000 mg 200 mL/hr over 60 Minutes Intravenous  Once 03/08/24 0912     03/08/24 1000  ceFEPIme  (MAXIPIME ) 2 g in  sodium chloride  0.9 % 100 mL IVPB        2 g 200 mL/hr over 30 Minutes Intravenous Every 8 hours 03/08/24 0912     03/06/24 2200  meropenem  (MERREM ) 1 g in sodium chloride  0.9 % 100 mL IVPB  Status:  Discontinued        1 g 200 mL/hr over 30 Minutes Intravenous Every 8 hours 03/06/24 2055 03/07/24 0908   03/05/24 1100  ceFEPIme  (MAXIPIME ) 2 g in sodium chloride  0.9 % 100 mL IVPB  Status:  Discontinued        2 g 200 mL/hr over 30 Minutes Intravenous Every 12 hours 03/05/24 1046 03/06/24 2055   03/05/24 0000  cefTRIAXone  (ROCEPHIN ) 1 g in sodium chloride  0.9 % 100 mL IVPB  Status:  Discontinued        1 g 200 mL/hr over 30 Minutes Intravenous Every 24 hours 03/04/24 0051 03/04/24 0059   03/05/24 0000  cefTRIAXone  (ROCEPHIN ) 2 g in sodium chloride  0.9 % 100 mL IVPB  Status:  Discontinued        2 g 200 mL/hr over 30 Minutes Intravenous Every 24 hours 03/04/24 0059 03/05/24 1046   03/04/24 0015  cefTRIAXone  (ROCEPHIN ) 2 g in sodium chloride  0.9 % 100 mL IVPB        2 g 200 mL/hr over 30 Minutes Intravenous  Once 03/04/24 0001 03/04/24 0052        Medications  Scheduled Meds:  carbidopa -levodopa   1 tablet Oral QHS   carbidopa -levodopa   2 tablet Oral QID   Chlorhexidine  Gluconate Cloth  6 each Topical Daily   enoxaparin  (LOVENOX ) injection  40 mg Subcutaneous Q24H   feeding supplement  237 mL Oral BID BM   lamoTRIgine   100 mg Oral BID   Continuous Infusions:  sodium chloride  40 mL/hr at 03/07/24 2256   amiodarone  60 mg/hr (03/08/24 0844)   Followed by   amiodarone      amiodarone      ceFEPime  (MAXIPIME ) IV     potassium PHOSPHATE  IVPB (in mmol)     vancomycin      vancomycin      PRN Meds:.acetaminophen  **OR** acetaminophen , LORazepam     Subjective:   [name] [name] was seen and examined today.  Tachypnea,   Objective:   Vitals:   03/08/24 0543 03/08/24 0816 03/08/24 0819 03/08/24 0909  BP: 111/70 (!) 78/46 (!) 109/59   Pulse: (!) 104     Resp: 16  (!) 28   Temp: (!)  97.5 F (36.4 C)   99.8 F (37.7 C)  TempSrc: Oral   Axillary  SpO2: 96% 96% 95%   Weight:    65.8 kg  Height:        Intake/Output Summary (Last 24 hours) at 03/08/2024 0939 Last data filed at 03/07/2024 2256 Gross per 24 hour  Intake 672 ml  Output --  Net 672 ml   Filed Weights   03/04/24 0620 03/08/24 0909  Weight: 65.4 kg 65.8 kg     Exam General exam: ill appearing elderly gentleman, cachetic looking, lethargic, not following commands.  Respiratory system: Clear to auscultation. Tachypnea,  Cardiovascular system: S1 & S2 heard,  tachycardic, irregularly irregular, no JVD. No pedal edema. Gastrointestinal system: Abdomen is soft , bs+ Central nervous system: lethargic, in mittens.  Extremities: no edema.  Skin: No rashes, multiple scab wounds n the lower extremities.    Data Reviewed:  I have personally reviewed following labs and imaging studies   CBC Lab Results  Component Value Date   WBC 12.9 (H) 03/08/2024   RBC 3.82 (L) 03/08/2024   HGB 11.9 (L) 03/08/2024   HCT 36.9 (L) 03/08/2024   MCV 96.6 03/08/2024   MCH 31.2 03/08/2024   PLT 336 03/08/2024   MCHC 32.2 03/08/2024   RDW 12.9 03/08/2024   LYMPHSABS 0.8 03/03/2024   MONOABS 1.0 03/03/2024   EOSABS 0.1 03/03/2024   BASOSABS 0.1 03/03/2024     Last metabolic panel Lab Results  Component Value Date   NA 145 03/08/2024   K 3.6 03/08/2024   CL 112 (H) 03/08/2024   CO2 23 03/08/2024   BUN 15 03/08/2024   CREATININE 0.88 03/08/2024   GLUCOSE 93 03/08/2024   GFRNONAA >60 03/08/2024   GFRAA >60 12/12/2014   CALCIUM  8.4 (L) 03/08/2024   PHOS 2.4 (L) 03/08/2024   PROT 6.3 (L) 03/03/2024   ALBUMIN 3.4 (L) 03/03/2024   BILITOT 0.7 03/03/2024   ALKPHOS 80 03/03/2024   AST 29 03/03/2024   ALT <5 03/03/2024   ANIONGAP 11 03/08/2024    CBG (last 3)  No results for input(s): GLUCAP in the last 72 hours.    Coagulation Profile: No results for input(s): INR, PROTIME in the last 168  hours.   Radiology Studies: EEG adult Result Date: 03/06/2024 Shelton Arlin KIDD, MD     03/06/2024  3:47 PM Patient Name: DEVARIOUS PAVEK MRN: [id number] Epilepsy Attending: Arlin KIDD Shelton Referring Physician/Provider: Alfornia Madison, MD Date: 03/06/2024 Duration: 32.13 mins Patient history: 79yo M with ams. EEG to evaluate for seizure Level of alertness: Awake AEDs during EEG study: LTG Technical aspects: This EEG study was done with scalp electrodes positioned according to the 10-20 International system of electrode placement. Electrical activity was reviewed with band pass filter of 1-70Hz , sensitivity of 7 uV/mm, display speed of 55mm/sec with a 60Hz  notched filter applied as appropriate. EEG data were recorded continuously and digitally stored.  Video monitoring was available and reviewed as appropriate. Description: The posterior dominant rhythm consists of 7Hz  activity of moderate voltage (25-35 uV) seen predominantly in posterior head regions, symmetric and reactive to eye opening and eye closing. EEG showed continuous generalized predominantly 5 to 7 Hz theta slowing admixed with intermittent 2-3hz  delta slowing. Hyperventilation and photic stimulation were not performed.    ABNORMALITY - Continuous slow, generalized  IMPRESSION: This study is suggestive of generalized cerebral dysfunction ( encephalopathy). No seizures or epileptiform discharges were seen throughout the recording.  Priyanka O Yadav       Ayad Nieman M.D. [facility] 03/08/2024, 9:39 AM  Available via Epic secure chat 7am-7pm After 7 pm, please refer to night coverage provider listed  on amion.    "

## 2024-03-08 NOTE — Consult Note (Signed)
 "                                                                                   Consultation Note Date: 03/08/2024   Patient Name: [name] [name]  DOB: [birth year]  MRN: [id number]  Age / Sex: 79 y.o., male  PCP: Beverley Louann ONEIDA, MD Referring Physician: Cherlyn Labella, MD  Reason for Consultation: Establishing goals of care  HPI/Patient Profile: 79 y.o. male admitted on 03/03/2024    Clinical Assessment and Goals of Care:  [name] [name] is a 79 year old male with advanced neurologic comorbidity, including Parkinsons disease with orthostatic hypotension (s/p deep brain stimulator), primary CNS lymphoma status post whole-brain radiation, seizure disorder, atrial fibrillation with RVR, BPH, depression, and GERD. He was seen by outpatient palliative care on 1/22 for worsening confusion, hallucinations, and impaired mobility and remained Full Code with goals focused on function and remaining at home with PT. He now presents with acute metabolic encephalopathy, characterized by confusion, agitation, and poor oral intake, prompting hospital admission.  His encephalopathy appears multifactorial, most likely related to dehydration and recent urinary tract infection in the setting of limited neurologic reserve. Initial infectious workup showed pyuria and leukocytosis with urine culture growing low-count Pseudomonas aeruginosa; blood cultures grew Staphylococcus saprophyticus and Acinetobacter in 1 of 4 bottles, felt to represent contamination. Antibiotics were discontinued following Infectious Disease consultation, with improvement in leukocytosis and no evidence of ongoing sepsis. Neuroimaging is without acute findings, EEG shows no seizure activity, and metabolic evaluation (TSH, B12, ammonia) is unrevealing. Steroids had already been tapered by neuro-oncology   From a neurologic standpoint, there is concern for progression of underlying disease with superimposed delirium. Seizure disorder is managed with  continued lamotrigine  and precautions; Parkinsons therapy (Sinemet ) is ongoing. He was recently started on quetiapine  for hallucinations and sleep disturbance. Functionally, he has had several weeks of poor oral intake with food pocketing and high aspiration risk, prompting NPO status, IV hydration, and speech/swallow evaluation.  Palliative care is consulted for global assessment, symptom support, and goals-of-care alignment in the context of acute decline on chronic neurologic illness. The patient remains Full Code, consistent with recent outpatient discussions. His wife is closely involved and reports increasing caregiver strain related to his cognitive and functional deterioration. Palliative care will continue to follow to support decision-making, provide anticipatory guidance, and reassess goals as the clinical trajectory and prognosis become clearer.  NEXT OF KIN  Wife [name] [name] at bedside.   SUMMARY OF RECOMMENDATIONS    Assessment and Plan.  Encephalopathy / Infection: His encephalopathy is most consistent with a multifactorial process, primarily dehydration and urinary tract infection, on a background of advanced neurologic disease. Initial concern for infection included pyuria, leukocytosis, and urine culture growing Pseudomonas aeruginosa; however, this was felt to be of low clinical significance, and antibiotics (cefepime ) have been discontinued per Infectious Disease recommendations. Blood cultures growing Staphylococcus saprophyticus and Acinetobacter are believed to represent contaminants. CT head shows no acute intracranial process, EEG without seizure activity, and metabolic workup (TSH, B12, ammonia) is unremarkable. Steroids had already been tapered by neuro-oncology and mental status is being monitored.   Neurologic / Functional Decline: He has known seizure  disorder with reported staring spells at home; no electrographic seizures were captured, and he remains on lamotrigine  with  seizure precautions. Parkinsons disease is managed with continuation of Sinemet . His wife reports several weeks of poor oral intake with pocketing of food, placing him at high risk for aspiration; he is currently NPO with IV hydration pending speech and swallow evaluation. Overall, his presentation reflects significant risk for ongoing decline and limited physiologic reserve in the setting of progressive neurologic illness.  Goals of Care: Palliative care consulted for support, symptom assessment, and goals-of-care clarification. At this time, the patient remains Full Code, consistent with recent outpatient palliative discussions. Wife is the primary historian and decision-support person and is concerned about his acute decline.   Palliative care will continue to provide anticipatory guidance, support decision-making as clinical trajectory becomes clearer, and reassess goals as needed in alignment with the patients values and prior expressed wishes. Code Status/Advance Care Planning: Full code   Symptom Management:     Palliative Prophylaxis:  Delirium Protocol  Additional Recommendations (Limitations, Scope, Preferences): Full Scope Treatment  Psycho-social/Spiritual:  Desire for further Chaplaincy support:yes Additional Recommendations: Caregiving  Support/Resources  Prognosis:  Unable to determine  Discharge Planning: To Be Determined      Primary Diagnoses: Present on Admission:  Acute metabolic encephalopathy  Parkinson's disease (HCC)  Primary CNS lymphoma (HCC)   I have reviewed the medical record, interviewed the patient and family, and examined the patient. The following aspects are pertinent.  Past Medical History:  Diagnosis Date   Agent orange exposure    Anemia    BPH (benign prostatic hypertrophy)    Cancer (HCC)    basal cell temple   Complication of anesthesia    confusion and combative when waking up from last kidney procedure   Constipation     Depression    Fatigue    d/t Parkinson's   GERD (gastroesophageal reflux disease)    occasional   History of kidney stones    Left ureteral calculus    Lower urinary tract symptoms (LUTS)    Nephrolithiasis    Neuromuscular disorder (HCC)     parkinson's   Orthostatic hypotension    d/t Parkinson's disease   Orthostatic hypotension    Parkinson's disease (HCC)    Peyronie disease    Pneumonia 2018   Renal cyst, left    S/P deep brain stimulator placement    11-29-2012   Seizure (HCC)    pt and wife report that he has had seizures and is now on seizure medicine   Sleep behavior disorder, REM    Wears glasses    Wears hearing aid    BILATERAL-- INTERMITTANT WEARS   Social History   Socioeconomic History   Marital status: Married    Spouse name: Not on file   Number of children: Not on file   Years of education: Not on file   Highest education level: Not on file  Occupational History   Not on file  Tobacco Use   Smoking status: Never   Smokeless tobacco: Never  Vaping Use   Vaping status: Never Used  Substance and Sexual Activity   Alcohol  use: Not Currently    Comment: rarely beer   Drug use: No   Sexual activity: Not Currently  Other Topics Concern   Not on file  Social History Narrative   Not on file   Social Drivers of Health   Tobacco Use: Low Risk (03/03/2024)   Patient History  Smoking Tobacco Use: Never    Smokeless Tobacco Use: Never    Passive Exposure: Not on file  Financial Resource Strain: Not on file  Food Insecurity: No Food Insecurity (03/04/2024)   Epic    Worried About Programme Researcher, Broadcasting/film/video in the Last Year: Never true    Ran Out of Food in the Last Year: Never true  Transportation Needs: No Transportation Needs (03/04/2024)   Epic    Lack of Transportation (Medical): No    Lack of Transportation (Non-Medical): No  Physical Activity: Not on file  Stress: Not on file  Social Connections: Moderately Isolated (03/04/2024)   Social  Connection and Isolation Panel    Frequency of Communication with Friends and Family: Three times a week    Frequency of Social Gatherings with Friends and Family: Three times a week    Attends Religious Services: Never    Active Member of Clubs or Organizations: No    Attends Banker Meetings: Never    Marital Status: Married  Depression (PHQ2-9): Low Risk (01/03/2024)   Depression (PHQ2-9)    PHQ-2 Score: 0  Alcohol  Screen: Not on file  Housing: Low Risk (03/04/2024)   Epic    Unable to Pay for Housing in the Last Year: No    Number of Times Moved in the Last Year: 0    Homeless in the Last Year: No  Utilities: Not At Risk (03/04/2024)   Epic    Threatened with loss of utilities: No  Health Literacy: Not on file   Family History  Problem Relation Age of Onset   Hypertension Mother    Dementia Father    Cancer Father    Heart attack Sister    Arthritis/Rheumatoid Sister    Scheduled Meds:  carbidopa -levodopa   1 tablet Oral QHS   carbidopa -levodopa   2 tablet Oral QID   Chlorhexidine  Gluconate Cloth  6 each Topical Daily   feeding supplement  237 mL Oral BID BM   haloperidol  lactate  2 mg Intravenous Once   heparin   3,000 Units Intravenous Once   lamoTRIgine   100 mg Per Tube BID   pantoprazole  (PROTONIX ) IV  40 mg Intravenous Daily   QUEtiapine   25 mg Per Tube QHS   Continuous Infusions:  sodium chloride  10 mL/hr at 03/08/24 1149   amiodarone  60 mg/hr (03/08/24 1149)   Followed by   amiodarone      ceFEPime  (MAXIPIME ) IV Stopped (03/08/24 1021)   heparin      lactated ringers      potassium PHOSPHATE  IVPB (in mmol) 30 mmol (03/08/24 1157)   vancomycin      PRN Meds:.acetaminophen  **OR** acetaminophen , sodium chloride  flush Medications Prior to Admission:  Prior to Admission medications  Medication Sig Start Date End Date Taking? Authorizing Provider  carbidopa -levodopa  (SINEMET  CR) 50-200 MG tablet Take 1 tablet by mouth at bedtime.   Yes [provider]  carbidopa -levodopa  (SINEMET  IR) 25-100 MG tablet 2-2.5 tablets 4 times per day at 6am/10:30am/2pm/6pm Patient taking differently: Take 2 tablets by mouth 4 (four) times daily. Take at 6am/10:30am/2pm/6pm 09/20/23  Yes Johnson, Clanford L, MD  cephALEXin  (KEFLEX ) 500 MG capsule Take 500 mg by mouth every 12 (twelve) hours. 02/22/24  Yes [provider]  cyanocobalamin  1000 MCG tablet Take 1 tablet (1,000 mcg total) by mouth daily. Patient taking differently: Take 1,000 mcg by mouth 3 (three) times a week. 07/11/23  Yes Rizwan, Saima, MD  lamoTRIgine  (LAMICTAL ) 100 MG tablet Take 1 tablet (100 mg total) by  mouth 2 (two) times daily. 01/03/24  Yes Vaslow, Zachary K, MD  QUEtiapine  (SEROQUEL ) 25 MG tablet Take 1 tablet (25 mg total) by mouth at bedtime. 03/01/24  Yes Pickenpack-Cousar, Athena N, NP  traZODone  (DESYREL ) 50 MG tablet Take 2 tablets (100 mg total) by mouth at bedtime. 12/11/23  Yes Geofm Delon BRAVO, NP  fluticasone  (FLONASE ) 50 MCG/ACT nasal spray Place 2 sprays into both nostrils daily. Patient not taking: Reported on 11/29/2023 10/26/23   Leath-Warren, Etta PARAS, NP  midodrine  (PROAMATINE ) 5 MG tablet Take 1 tablet (5 mg total) by mouth 3 (three) times daily with meals. Patient not taking: Reported on 09/28/2023 08/30/23   Tat, Asberry RAMAN, DO  ondansetron  (ZOFRAN -ODT) 8 MG disintegrating tablet Take 1 tablet (8 mg total) by mouth every 8 (eight) hours as needed for nausea or vomiting. Patient not taking: No sig reported 12/14/23   Izell Domino, MD  pantoprazole  (PROTONIX ) 40 MG tablet TAKE 1 TABLET BY MOUTH EVERY DAY Patient not taking: Reported on 03/04/2024 11/22/23   Vaslow, Zachary K, MD  traZODone  (DESYREL ) 50 MG tablet TAKE 1 TABLET BY MOUTH EVERYDAY AT BEDTIME Patient not taking: Reported on 03/04/2024 12/13/23   Vaslow, Zachary K, MD   Allergies[1] Review of Systems No acute uncontrolled symptoms.   Physical Exam Awake alert Appears weak, with generalized  illness Clear S 1 S 2  No edema  Vital Signs: BP 120/71 (BP Location: Right Arm)   Pulse 86   Temp 99.6 F (37.6 C) (Axillary)   Resp (!) 27   Ht 5' 10 (1.778 m)   Wt 65.8 kg   SpO2 100%   BMI 20.81 kg/m  Pain Scale: PAINAD   Pain Score: 0-No pain   SpO2: SpO2: 100 % O2 Device:SpO2: 100 % O2 Flow Rate: .   IO: Intake/output summary:  Intake/Output Summary (Last 24 hours) at 03/08/2024 1253 Last data filed at 03/08/2024 1149 Gross per 24 hour  Intake 3118.59 ml  Output --  Net 3118.59 ml    LBM: Last BM Date : 03/08/24 Baseline Weight: Weight: 65.4 kg Most recent weight: Weight: 65.8 kg     Palliative Assessment/Data:   PPS 50%  Time In:  9 Time Out:  10.15 Time Total:  75 Greater than 50%  of this time was spent counseling and coordinating care related to the above assessment and plan.  Signed by: Lonia Serve, MD   Please contact Palliative Medicine Team phone at [phone] for questions and concerns.  For individual provider: See Amion                 [1]  Allergies Allergen Reactions   Alfuzosin  Other (See Comments)    Hypotension    Rivastigmine Tartrate Nausea And Vomiting     Can tolerate the patch but can not tolerate the pills   Tamsulosin Other (See Comments)    Severe joint pain   "

## 2024-03-08 NOTE — Progress Notes (Signed)
 Peripherally Inserted Central Catheter Placement  The IV Nurse has discussed with the patient and/or persons authorized to consent for the patient, the purpose of this procedure and the potential benefits and risks involved with this procedure.  The benefits include less needle sticks, lab draws from the catheter, and the patient may be discharged home with the catheter. Risks include, but not limited to, infection, bleeding, blood clot (thrombus formation), and puncture of an artery; nerve damage and irregular heartbeat and possibility to perform a PICC exchange if needed/ordered by physician.  Alternatives to this procedure were also discussed.  Bard Power PICC patient education guide, fact sheet on infection prevention and patient information card has been provided to patient /or left at bedside.    PICC Placement Documentation  PICC Triple Lumen 03/08/24 Right Basilic 35 cm 0 cm (Active)  Indication for Insertion or Continuance of Line Vasoactive infusions 03/08/24 1245  Exposed Catheter (cm) 0 cm 03/08/24 1245  Site Assessment Clean, Dry, Intact 03/08/24 1245  Lumen #1 Status Flushed;Saline locked;Blood return noted 03/08/24 1245  Lumen #2 Status Flushed;Saline locked;No blood return 03/08/24 1245  Lumen #3 Status Flushed;Saline locked;Blood return noted 03/08/24 1245  Dressing Type Transparent;Securing device 03/08/24 1245  Dressing Status Antimicrobial disc/dressing in place;Clean, Dry, Intact 03/08/24 1245  Line Care Connections checked and tightened 03/08/24 1245  Line Adjustment (NICU/IV Team Only) No 03/08/24 1245  Dressing Intervention New dressing;Adhesive placed at insertion site (IV team only) 03/08/24 1245    Patient's wife, Jb Dulworth, signed PICC consent at bedside prior to insertion.   Loral Campi 03/08/2024, 12:48 PM

## 2024-03-08 NOTE — Progress Notes (Signed)
 Called by telemetry because pt HR was in 170's; found patient with wife in the room, had increased RR and low BP as low as 70's/40's systolic, EKG performed, pt in new afib RVR; MD Cherlyn and rapid RN Ethan to bedside, LR bolus and IV Amio bolus/drip started. Patient transferred to ICU.    03/08/24 0819  Assess: MEWS Score  Temp 98.4 F (36.9 C)  BP (!) 109/59  Pulse Rate (!) 168  ECG Heart Rate (!) 170  Resp (!) 28  Level of Consciousness Alert  SpO2 95 %  O2 Device Room Air  Assess: MEWS Score  MEWS Temp 0  MEWS Systolic 0  MEWS Pulse 3  MEWS RR 2  MEWS LOC 0  MEWS Score 5  MEWS Score Color Red  Assess: if the MEWS score is Yellow or Red  Were vital signs accurate and taken at a resting state? Yes  Does the patient meet 2 or more of the SIRS criteria? No  MEWS guidelines implemented  Yes, red  Treat  MEWS Interventions Considered administering scheduled or prn medications/treatments as ordered  Take Vital Signs  Increase Vital Sign Frequency  Red: Q1hr x2, continue Q4hrs until patient remains green for 12hrs  Escalate  MEWS: Escalate Red: Discuss with charge nurse and notify provider. Consider notifying RRT. If remains red for 2 hours consider need for higher level of care  Notify: Charge Nurse/RN  Name of Charge Nurse/RN Notified Megan, RN  Provider Notification  Provider Name/Title Cherlyn Labella MD  Date Provider Notified 03/08/24  Time Provider Notified 0815  Method of Notification Page;Face-to-face  Notification Reason Change in status;New onset of dysrhythmia  Provider response At bedside;See new orders  Date of Provider Response 03/08/24  Time of Provider Response 0815  Notify: Rapid Response  Name of Rapid Response RN Notified Ethan, RN  Date Rapid Response Notified 03/08/24  Time Rapid Response Notified 0815  Assess: SIRS CRITERIA  SIRS Temperature  0  SIRS Respirations  1  SIRS Pulse 1  SIRS WBC 1  SIRS Score Sum  3

## 2024-03-08 NOTE — Progress Notes (Signed)
 PHARMACY - ANTICOAGULATION CONSULT NOTE  Pharmacy Consult for Heparin   Indication: atrial fibrillation  Allergies[1]  Patient Measurements: Height: 5' 10 (177.8 cm) Weight: 65.8 kg (145 lb 1 oz) IBW/kg (Calculated) : 73 HEPARIN  DW (KG): 65.4  Vital Signs: Temp: 99.8 F (37.7 C) (02/04 0909) Temp Source: Axillary (02/04 0909) BP: 83/50 (02/04 0850) Pulse Rate: 168 (02/04 0819)  Labs: Recent Labs    03/06/24 0337 03/07/24 0350 03/08/24 0711  HGB 11.5* 11.4* 11.9*  HCT 36.4* 36.3* 36.9*  PLT 270 280 336  CREATININE 1.15 0.79 0.88    Estimated Creatinine Clearance: 63.3 mL/min (by C-G formula based on SCr of 0.88 mg/dL).   Medical History: Past Medical History:  Diagnosis Date   Agent orange exposure    Anemia    BPH (benign prostatic hypertrophy)    Cancer (HCC)    basal cell temple   Complication of anesthesia    confusion and combative when waking up from last kidney procedure   Constipation    Depression    Fatigue    d/t Parkinson's   GERD (gastroesophageal reflux disease)    occasional   History of kidney stones    Left ureteral calculus    Lower urinary tract symptoms (LUTS)    Nephrolithiasis    Neuromuscular disorder (HCC)     parkinson's   Orthostatic hypotension    d/t Parkinson's disease   Orthostatic hypotension    Parkinson's disease (HCC)    Peyronie disease    Pneumonia 2018   Renal cyst, left    S/P deep brain stimulator placement    11-29-2012   Seizure (HCC)    pt and wife report that he has had seizures and is now on seizure medicine   Sleep behavior disorder, REM    Wears glasses    Wears hearing aid    BILATERAL-- INTERMITTANT WEARS    Medications:  Patient is not on long term anticoagulation   Assessment: Patient is a 49 yoM admitted for acute metabolic encephalopathy. 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 Patient found to be in afib with RVR on 2/4 AM. On lovenox  ppx - LD 2/4 at 0952. Pharmacy consulted for heparin   management.   - Hgb 11.9 low/stable; PLTc WNL  - Renal function stable  Goal of Therapy:  Heparin  level 0.3-0.7 units/ml Monitor platelets by anticoagulation protocol: Yes   Plan:  Heparin  3000 units IV bolus x 1  Start heparin  infusion at 950 units/hr Check heparin  level in 8 hours after infusion initiation  Monitor CBC and heparin  daily  Monitor for signs and symptoms of bleeding   Thank you for allowing pharmacy to be a part of this patients care.  Marget Hench, PharmD Clinical Pharmacist 03/08/2024 11:12 AM         [1]  Allergies Allergen Reactions   Alfuzosin  Other (See Comments)    Hypotension    Rivastigmine Tartrate Nausea And Vomiting     Can tolerate the patch but can not tolerate the pills   Tamsulosin Other (See Comments)    Severe joint pain

## 2024-03-09 ENCOUNTER — Telehealth: Payer: Self-pay | Admitting: *Deleted

## 2024-03-09 DIAGNOSIS — N390 Urinary tract infection, site not specified: Secondary | ICD-10-CM | POA: Diagnosis not present

## 2024-03-09 DIAGNOSIS — E43 Unspecified severe protein-calorie malnutrition: Secondary | ICD-10-CM | POA: Insufficient documentation

## 2024-03-09 DIAGNOSIS — I480 Paroxysmal atrial fibrillation: Secondary | ICD-10-CM | POA: Diagnosis not present

## 2024-03-09 DIAGNOSIS — G20A1 Parkinson's disease without dyskinesia, without mention of fluctuations: Secondary | ICD-10-CM | POA: Diagnosis not present

## 2024-03-09 DIAGNOSIS — G9341 Metabolic encephalopathy: Secondary | ICD-10-CM | POA: Diagnosis not present

## 2024-03-09 LAB — GLUCOSE, CAPILLARY
Glucose-Capillary: 111 mg/dL — ABNORMAL HIGH (ref 70–99)
Glucose-Capillary: 112 mg/dL — ABNORMAL HIGH (ref 70–99)
Glucose-Capillary: 92 mg/dL (ref 70–99)
Glucose-Capillary: 98 mg/dL (ref 70–99)
Glucose-Capillary: 99 mg/dL (ref 70–99)

## 2024-03-09 LAB — CBC WITH DIFFERENTIAL/PLATELET
Abs Immature Granulocytes: 0.43 10*3/uL — ABNORMAL HIGH (ref 0.00–0.07)
Basophils Absolute: 0.1 10*3/uL (ref 0.0–0.1)
Basophils Relative: 1 %
Eosinophils Absolute: 0.2 10*3/uL (ref 0.0–0.5)
Eosinophils Relative: 2 %
HCT: 28.9 % — ABNORMAL LOW (ref 39.0–52.0)
Hemoglobin: 9 g/dL — ABNORMAL LOW (ref 13.0–17.0)
Immature Granulocytes: 4 %
Lymphocytes Relative: 8 %
Lymphs Abs: 0.8 10*3/uL (ref 0.7–4.0)
MCH: 30.9 pg (ref 26.0–34.0)
MCHC: 31.1 g/dL (ref 30.0–36.0)
MCV: 99.3 fL (ref 80.0–100.0)
Monocytes Absolute: 0.8 10*3/uL (ref 0.1–1.0)
Monocytes Relative: 8 %
Neutro Abs: 8 10*3/uL — ABNORMAL HIGH (ref 1.7–7.7)
Neutrophils Relative %: 77 %
Platelets: 260 10*3/uL (ref 150–400)
RBC: 2.91 MIL/uL — ABNORMAL LOW (ref 4.22–5.81)
RDW: 12.9 % (ref 11.5–15.5)
Smear Review: NORMAL
WBC: 10.4 10*3/uL (ref 4.0–10.5)
nRBC: 0 % (ref 0.0–0.2)

## 2024-03-09 LAB — URINALYSIS, ROUTINE W REFLEX MICROSCOPIC
Bilirubin Urine: NEGATIVE
Glucose, UA: NEGATIVE mg/dL
Hgb urine dipstick: NEGATIVE
Ketones, ur: 20 mg/dL — AB
Leukocytes,Ua: NEGATIVE
Nitrite: NEGATIVE
Protein, ur: NEGATIVE mg/dL
Specific Gravity, Urine: 1.03 (ref 1.005–1.030)
pH: 5 (ref 5.0–8.0)

## 2024-03-09 LAB — HEPARIN LEVEL (UNFRACTIONATED): Heparin Unfractionated: 0.36 [IU]/mL (ref 0.30–0.70)

## 2024-03-09 LAB — COMPREHENSIVE METABOLIC PANEL WITH GFR
ALT: <5 U/L (ref 0–44)
AST: 15 U/L (ref 15–41)
Albumin: 2.1 g/dL — ABNORMAL LOW (ref 3.5–5.0)
Alkaline Phosphatase: 59 U/L (ref 38–126)
Anion gap: 10 (ref 5–15)
BUN: 11 mg/dL (ref 8–23)
CO2: 21 mmol/L — ABNORMAL LOW (ref 22–32)
Calcium: 7.5 mg/dL — ABNORMAL LOW (ref 8.9–10.3)
Chloride: 109 mmol/L (ref 98–111)
Creatinine, Ser: 0.65 mg/dL (ref 0.61–1.24)
GFR, Estimated: >60 mL/min
Glucose, Bld: 165 mg/dL — ABNORMAL HIGH (ref 70–99)
Potassium: 3.4 mmol/L — ABNORMAL LOW (ref 3.5–5.1)
Sodium: 140 mmol/L (ref 135–145)
Total Bilirubin: <0.2 mg/dL (ref 0.0–1.2)
Total Protein: 3.7 g/dL — ABNORMAL LOW (ref 6.5–8.1)

## 2024-03-09 LAB — CULTURE, BLOOD (ROUTINE X 2): Culture: NO GROWTH

## 2024-03-09 LAB — PHOSPHORUS: Phosphorus: 1.9 mg/dL — ABNORMAL LOW (ref 2.5–4.6)

## 2024-03-09 LAB — LACTIC ACID, PLASMA
Lactic Acid, Venous: 2.7 mmol/L (ref 0.5–1.9)
Lactic Acid, Venous: 2.5 mmol/L (ref 0.5–1.9)

## 2024-03-09 LAB — MAGNESIUM: Magnesium: 1.5 mg/dL — ABNORMAL LOW (ref 1.7–2.4)

## 2024-03-09 MED ORDER — ACETAMINOPHEN 325 MG PO TABS
650.0000 mg | ORAL_TABLET | Freq: Four times a day (QID) | ORAL | Status: AC | PRN
  Administered 2024-03-10: 650 mg
  Filled 2024-03-09: qty 2

## 2024-03-09 MED ORDER — LORAZEPAM 2 MG/ML IJ SOLN: 0.5000 mg | Freq: Once | INTRAMUSCULAR | Status: AC

## 2024-03-09 MED ORDER — CARMEX CLASSIC LIP BALM EX OINT
1.0000 | TOPICAL_OINTMENT | CUTANEOUS | Status: AC | PRN
  Filled 2024-03-09: qty 10

## 2024-03-09 MED ORDER — AMIODARONE HCL 200 MG PO TABS: 200.0000 mg | ORAL_TABLET | Freq: Every day | ORAL | Status: AC

## 2024-03-09 MED ORDER — ORAL CARE MOUTH RINSE: 15.0000 mL | OROMUCOSAL | Status: DC | PRN

## 2024-03-09 MED ORDER — AMIODARONE HCL 200 MG PO TABS: 200.0000 mg | ORAL_TABLET | Freq: Every day | ORAL | Status: DC

## 2024-03-09 MED ORDER — AMIODARONE HCL 200 MG PO TABS: 400.0000 mg | ORAL_TABLET | Freq: Two times a day (BID) | ORAL | Status: DC

## 2024-03-09 MED ORDER — CARBIDOPA-LEVODOPA 25-100 MG PO TABS
2.0000 | ORAL_TABLET | Freq: Four times a day (QID) | ORAL | Status: AC
  Administered 2024-03-09 – 2024-03-10 (×5): 2
  Filled 2024-03-09 (×6): qty 2

## 2024-03-09 MED ORDER — MAGNESIUM SULFATE 4 GM/100ML IV SOLN
4.0000 g | Freq: Once | INTRAVENOUS | Status: AC
  Administered 2024-03-09: 4 g via INTRAVENOUS
  Filled 2024-03-09: qty 100

## 2024-03-09 MED ORDER — POTASSIUM PHOSPHATES 15 MMOLE/5ML IV SOLN
30.0000 mmol | Freq: Once | INTRAVENOUS | Status: AC
  Administered 2024-03-09: 30 mmol via INTRAVENOUS
  Filled 2024-03-09: qty 30

## 2024-03-09 MED ORDER — KATE FARMS STANDARD 1.4 EN LIQD
1000.0000 mL | ENTERAL | Status: AC
  Administered 2024-03-09: 1000 mL
  Filled 2024-03-09 (×2): qty 1000

## 2024-03-09 MED ORDER — CARBIDOPA-LEVODOPA 25-100 MG PO TABS
1.0000 | ORAL_TABLET | Freq: Two times a day (BID) | ORAL | Status: AC
  Administered 2024-03-10 (×2): 1
  Filled 2024-03-09 (×3): qty 1

## 2024-03-09 MED ORDER — FREE WATER
200.0000 mL | Freq: Four times a day (QID) | Status: AC
  Administered 2024-03-09 (×2): 200 mL
  Administered 2024-03-10: 50 mL
  Administered 2024-03-10: 200 mL
  Administered 2024-03-10: 150 mL
  Administered 2024-03-10 (×2): 200 mL

## 2024-03-09 MED ORDER — CARBIDOPA-LEVODOPA 25-100 MG PO TABS
2.0000 | ORAL_TABLET | Freq: Four times a day (QID) | ORAL | Status: DC
  Administered 2024-03-09 (×2): 2
  Filled 2024-03-09 (×3): qty 2

## 2024-03-09 MED ORDER — CARBIDOPA-LEVODOPA 25-100 MG PO TABS
2.5000 | ORAL_TABLET | Freq: Four times a day (QID) | ORAL | Status: DC
  Filled 2024-03-09: qty 2.5

## 2024-03-09 MED ORDER — ACETAMINOPHEN 650 MG RE SUPP: 650.0000 mg | Freq: Four times a day (QID) | RECTAL | Status: AC | PRN

## 2024-03-09 MED ORDER — ENSURE PLUS HIGH PROTEIN PO LIQD: 237.0000 mL | Freq: Two times a day (BID) | ORAL | Status: DC

## 2024-03-09 MED ORDER — THIAMINE MONONITRATE 100 MG PO TABS
100.0000 mg | ORAL_TABLET | Freq: Every day | ORAL | Status: AC
  Administered 2024-03-09 – 2024-03-10 (×2): 100 mg
  Filled 2024-03-09 (×2): qty 1

## 2024-03-09 MED ORDER — ORAL CARE MOUTH RINSE
15.0000 mL | OROMUCOSAL | Status: DC
  Administered 2024-03-09 (×3): 15 mL via OROMUCOSAL

## 2024-03-09 MED ORDER — AMIODARONE HCL 200 MG PO TABS
400.0000 mg | ORAL_TABLET | Freq: Two times a day (BID) | ORAL | Status: AC
  Administered 2024-03-09 – 2024-03-10 (×4): 400 mg via NASOGASTRIC
  Filled 2024-03-09 (×4): qty 2

## 2024-03-09 NOTE — Telephone Encounter (Signed)
 Patients son Selinda called to report concern from patients wife.  He is hospitalized for sepsis and patient has been very confused and having hallucinations.  He has since been tapered off of steroids.  The wife reports that when he started taking the lamotrigine  she had started noticing some changes in the patient and she is asking is the lamotrigine  necessary and could it be contributing to his confusion/hallucinations.  Advised I would route the inquiry over to the doctor on her behalf.

## 2024-03-09 NOTE — Progress Notes (Addendum)
 Brief Nutrition Support Update  Cortrak placed yesterday, tube now xray verified in the stomach. Discussed with MD, will initiate tube feeds today. Will advance very slowly as suspect patient is high risk of refeeding syndrome, especially given all electrolytes low this morning. Orders are in for repletion.  Medications reviewed and include: Sinemet   Labs reviewed:  Latest Reference Range & Units 03/09/24 06:05  Potassium 3.5 - 5.1 mmol/L 3.4 (L)  Phosphorus 2.5 - 4.6 mg/dL 1.9 (L)  Magnesium  1.7 - 2.4 mg/dL 1.5 (L)    INTERVENTION:  - Initiate tube feeding via [employer] 1.4 at 65 ml/h (1560 ml per day) *Start at 15 and advance by 10 mL every 12 hours to reach goal Free water : 200 mL every 6 hours Provides 2184 kcal, 96 gm protein, 1908 ml free water  daily (Tf + flushes)    - Pt is at risk for refeeding syndrome given severe malnutrition. Monitor magnesium  and phosphorus daily x 4 days, MD to replete as needed. - Add 100mg  thiamine  x 7 days    - Will monitor for diet advancement.    Trude Ned RD, LDN Contact via [employer].

## 2024-03-09 NOTE — Progress Notes (Signed)
"  °  Progress Note  Patient Name: [name] [name] Date of Encounter: 03/09/2024 [facility] Cardiologist: Dorn Lesches, MD   Interval Summary   No acute events overnight. Going on tube feeds now.  No significant change in clinical status.  Wife is accompanied at the bedside.  Vital Signs Vitals:   03/09/24 0600 03/09/24 0630 03/09/24 0700 03/09/24 0725  BP: (!) 150/81 (!) 131/115    Pulse: 80 87 84   Resp: 18 (!) 26 (!) 21   Temp:    99 F (37.2 C)  TempSrc:    Oral  SpO2: 95% 90% 97%   Weight:      Height:        Intake/Output Summary (Last 24 hours) at 03/09/2024 0936 Last data filed at 03/09/2024 9379 Gross per 24 hour  Intake 4654.04 ml  Output 650 ml  Net 4004.04 ml      03/08/2024    9:30 AM 03/08/2024    9:09 AM 03/04/2024    6:20 AM  Last 3 Weights  Weight (lbs) 145 lb 1 oz 145 lb 1 oz 144 lb 2.9 oz  Weight (kg) 65.8 kg 65.8 kg 65.4 kg      Telemetry/ECG  Sinus rhythm heart rates in the 70s- Personally Reviewed  Physical Exam  GEN: Ill-appearing, not very arousable Neck: No JVD Cardiac: RRR, no murmurs, rubs, or gallops.  Respiratory: Clear to auscultation bilaterally. GI: Soft, nontender, non-distended  MS: No edema  Patient Profile Patient with past medical history significant for Parkinson's disease with orthostatic hypotension, deep brain stimulator, CNS lymphoma status post WBRT, BPH, agent orange exposure, anemia, kidney stones, seizures, GERD.  Patient here with sepsis secondary to UTI.  Found to be in new onset atrial fibrillation.  Assessment & Plan   New onset A-fib RVR In the setting of urosepsis.  Started on IV amiodarone  and converted back to sinus rhythm and has been maintaining sinus rhythm.  Echocardiogram with preserved biventricular function with only mild MR.  Euvolemic on exam.  He is critically ill and not able to take p.o. currently. For now we will continue with IV heparin , IV amiodarone .  Clinical trajectory is guarded but  eventually will need to make determination for long-term anticoagulation.  Reviewed with his oncologist and no contraindications to DOAC. Palliative care is following.  Sepsis secondary to UTI Aspiration pneumonia Clinical trajectory is guarded he has CNS lymphoma and severe Parkinson disease.  Palliative care involved at this point.  He did have elevated lactic acid around 4 yesterday, recommend rechecking ensuring this clears.  Elevated troponins Low suspicion for ACS, would not recommend ischemic workup.    For questions or updates, please contact [facility] Please consult [email or web address] for contact info under       Signed, Thom LITTIE Sluder, PA-C    "

## 2024-03-09 NOTE — Progress Notes (Signed)
 "        [facility]                                                                               [name] [name], is a 79 y.o. male, DOB - April 17, 1945, FMW:[id number] Admit date - 03/03/2024    Outpatient Primary MD for the patient is Beverley Parry T, MD  LOS - 5  days    Brief summary    79 y.o. male with PMH significant for primary CNS lymphoma status post WBRT, seizures, Parkinson's disease, orthostatic hypotension, BPH, depression, GERD.  He has recently seen  palliative care on 1/22 for increased confusion / hallucinations and impaired mobility.  His symptoms were attributed to urinary tract infection and he was prescribed a 10-day course of Keflex .  He was recently started on Seroquel  2 days ago for hallucinations and sleep disturbance.  Dr. Vaslow has recently tapered off his steroids.  Patient presented in the ED further evaluation of altered mental status / confusion / agitation.   UA with negative nitrite, large leukocytes, 21-50 RBCs, >50 WBCs, and no bacteria. Urine culture blood cultures were obtained. Chest x-ray no acute cardiopulmonary abnormality. CT head without any acute intracranial finding. Wife reports patient has multiple falls at home.   Assessment & Plan    Assessment and Plan:  Atrial fibrillation with RVR and hypotension:  Converted to NSR today. In the setting of possible sepsis from UTI/ Sepsis/possible aspiration pneumonia Patient's heart rate was between 140 to 160/min ON 2/4 along with hypotension. EKG shows new onset atrial fibrillation with RVR Patient was started on amiodarone  bolus, and gtt transitioned to oral amiodarone . Recommend to continue with ringer lactate.  Cardiology consulted, Echocardiogram showed LVEF 60 to 65%, diastolic parameters are indeterminate.  Repeat blood cultures and started the patient on broad-spectrum IV antibiotics. CHA2DS2-VASc Score = 3 , was started on IV heparin . TSH wnl.    Pseudomonas urinary tract  infection Restart the patient on IV cefepime    Acinetobacter and staph saprophyticus in blood cultures from January 30 Initially thought to be a contaminant.  Repeat blood cultures and started the patient on broad-spectrum IV antibiotics. Febrile overnight, . CXR showing CHF.  Will get UA.     Acute metabolic encephalopathy in the setting of progressive neurological disorder, Parkinson's disease and CNS lymphoma s/p whole brain radiation and  seizures Patient currently minimally responsive, able to recognize his wife. He has poor oral intake currently n.p.o. for  SLP evaluation. He is on carbidopa  levodopa  and the Lamictal .   Hypophosphatemia Replaced with potassium phosphate     Poor oral intake/cachexia In view of his AMS, SLP recommended palliative / GOC discussions, recommended NPO and ST will sign off. S/p cortrak.  Nutrition on board     History of primary CNS lymphoma status post WBRT: Follows with Dr. [name].  He was previously on steroids which have now been tapered off due to concern for altered mental status / Hallucinations.  CT head  showing no acute findings.     Seizures Resume lamictal .   H/o orthostatic hypotension:  Chronic .      Estimated body mass index is 20.81 kg/m as calculated from  the following:   Height as of this encounter: 5' 10 (1.778 m).   Weight as of this encounter: 65.8 kg.  Code Status: full code  DVT Prophylaxis:  Heparin .   Level of Care: Level of care: Stepdown Family Communication: Updated patient's family at bedside.   Disposition Plan:     Remains inpatient appropriate:  pending further work up for afib.    Procedures:  CT head without contrast.  Consultants:   PCCM Cardiology Oncology  Palliative care.   Antimicrobials:   Anti-infectives (From admission, onward)    Start     Dose/Rate Route Frequency Ordered Stop   03/08/24 2200  vancomycin  (VANCOREADY) IVPB 750 mg/150 mL        750 mg 150 mL/hr over 60  Minutes Intravenous Every 12 hours 03/08/24 0912     03/08/24 1000  vancomycin  (VANCOCIN ) IVPB 1000 mg/200 mL premix        1,000 mg 200 mL/hr over 60 Minutes Intravenous  Once 03/08/24 0912 03/08/24 1140   03/08/24 1000  ceFEPIme  (MAXIPIME ) 2 g in sodium chloride  0.9 % 100 mL IVPB        2 g 200 mL/hr over 30 Minutes Intravenous Every 8 hours 03/08/24 0912     03/06/24 2200  meropenem  (MERREM ) 1 g in sodium chloride  0.9 % 100 mL IVPB  Status:  Discontinued        1 g 200 mL/hr over 30 Minutes Intravenous Every 8 hours 03/06/24 2055 03/07/24 0908   03/05/24 1100  ceFEPIme  (MAXIPIME ) 2 g in sodium chloride  0.9 % 100 mL IVPB  Status:  Discontinued        2 g 200 mL/hr over 30 Minutes Intravenous Every 12 hours 03/05/24 1046 03/06/24 2055   03/05/24 0000  cefTRIAXone  (ROCEPHIN ) 1 g in sodium chloride  0.9 % 100 mL IVPB  Status:  Discontinued        1 g 200 mL/hr over 30 Minutes Intravenous Every 24 hours 03/04/24 0051 03/04/24 0059   03/05/24 0000  cefTRIAXone  (ROCEPHIN ) 2 g in sodium chloride  0.9 % 100 mL IVPB  Status:  Discontinued        2 g 200 mL/hr over 30 Minutes Intravenous Every 24 hours 03/04/24 0059 03/05/24 1046   03/04/24 0015  cefTRIAXone  (ROCEPHIN ) 2 g in sodium chloride  0.9 % 100 mL IVPB        2 g 200 mL/hr over 30 Minutes Intravenous  Once 03/04/24 0001 03/04/24 0052        Medications  Scheduled Meds:  amiodarone   400 mg Per NG tube BID   Followed by   NOREEN ON 03/14/2024] amiodarone   200 mg Per NG tube Daily   carbidopa -levodopa   1 tablet Oral QHS   carbidopa -levodopa   2 tablet Per Tube QID   Chlorhexidine  Gluconate Cloth  6 each Topical Daily   free water   200 mL Per Tube Q6H   lamoTRIgine   100 mg Per Tube BID   mouth rinse  15 mL Mouth Rinse 4 times per day   pantoprazole  (PROTONIX ) IV  40 mg Intravenous Daily   thiamine   100 mg Per Tube Daily   Continuous Infusions:  ceFEPime  (MAXIPIME ) IV Stopped (03/09/24 1054)   feeding supplement (KATE [employer]ARMS STANDARD  ENT 1.4) 15 mL/hr at 03/09/24 1250   heparin  950 Units/hr (03/09/24 1250)   potassium PHOSPHATE  IVPB (in mmol) 85 mL/hr at 03/09/24 1250   vancomycin  Stopped (03/09/24 1155)   PRN Meds:.acetaminophen  **OR** acetaminophen , mouth rinse, sodium chloride  flush  Subjective:   Iva Posten was seen and examined today.  Confused.   Objective:   Vitals:   03/09/24 1000 03/09/24 1100 03/09/24 1200 03/09/24 1210  BP: 102/78 (!) 116/58 (!) 119/58   Pulse: (!) 158 73 75   Resp: (!) 22 (!) 32 (!) 23   Temp:    98.4 F (36.9 C)  TempSrc:    Oral  SpO2: 97% 95% 97%   Weight:      Height:        Intake/Output Summary (Last 24 hours) at 03/09/2024 1303 Last data filed at 03/09/2024 1250 Gross per 24 hour  Intake 4087.22 ml  Output 650 ml  Net 3437.22 ml   Filed Weights   03/04/24 0620 03/08/24 0909 03/08/24 0930  Weight: 65.4 kg 65.8 kg 65.8 kg     Exam General exam: ill appearing cachetic looking gentleman, not in distress.  Respiratory system: Clear to auscultation. tachypnea Cardiovascular system: S1 & S2 heard, RRR.  Gastrointestinal system: Abdomen is soft bs+ Central nervous system: alert but confused.  Extremities: no pedal edema.  Skin: No rashes,  Psychiatry: unable to assess.    Data Reviewed:  I have personally reviewed following labs and imaging studies   CBC Lab Results  Component Value Date   WBC 10.4 03/09/2024   RBC 2.91 (L) 03/09/2024   HGB 9.0 (L) 03/09/2024   HCT 28.9 (L) 03/09/2024   MCV 99.3 03/09/2024   MCH 30.9 03/09/2024   PLT 260 03/09/2024   MCHC 31.1 03/09/2024   RDW 12.9 03/09/2024   LYMPHSABS 0.8 03/09/2024   MONOABS 0.8 03/09/2024   EOSABS 0.2 03/09/2024   BASOSABS 0.1 03/09/2024     Last metabolic panel Lab Results  Component Value Date   NA 140 03/09/2024   K 3.4 (L) 03/09/2024   CL 109 03/09/2024   CO2 21 (L) 03/09/2024   BUN 11 03/09/2024   CREATININE 0.65 03/09/2024   GLUCOSE 165 (H) 03/09/2024   GFRNONAA >60  03/09/2024   GFRAA >60 12/12/2014   CALCIUM  7.5 (L) 03/09/2024   PHOS 1.9 (L) 03/09/2024   PROT 3.7 (L) 03/09/2024   ALBUMIN 2.1 (L) 03/09/2024   BILITOT <0.2 03/09/2024   ALKPHOS 59 03/09/2024   AST 15 03/09/2024   ALT <5 03/09/2024   ANIONGAP 10 03/09/2024    CBG (last 3)  Recent Labs    03/09/24 0906 03/09/24 1126  GLUCAP 99 111*      Coagulation Profile: No results for input(s): INR, PROTIME in the last 168 hours.   Radiology Studies: DG Abd 1 View Result Date: 03/08/2024 CLINICAL DATA:  Nasogastric tube placement. EXAM: ABDOMEN - 1 VIEW COMPARISON:  None Available. FINDINGS: Tip of the weighted enteric tube below the diaphragm in the stomach. The tubing is looped in the gastric body with the tip directed towards the cardia. Mild gaseous distension of small and large bowel in the included upper abdomen. IMPRESSION: Tip of the weighted enteric tube below the diaphragm in the stomach. The tubing is looped in the gastric body with the tip directed towards the cardia. Electronically Signed   By: Andrea Gasman M.D.   On: 03/08/2024 18:00   ECHOCARDIOGRAM COMPLETE Result Date: 03/08/2024    ECHOCARDIOGRAM REPORT   Patient Name:   ALEXXANDER KURT Date of Exam: 03/08/2024 Medical Rec #:  [id number]      Height:       70.0 in Accession #:    [id number]     Weight:  145.1 lb Date of Birth:  January 02, 1946      BSA:          1.821 m Patient Age:    86 years       BP:           118/49 mmHg Patient Gender: M              HR:           81 bpm. Exam Location:  Inpatient Procedure: 2D Echo, Cardiac Doppler, Color Doppler and Intracardiac            Opacification Agent (Both Spectral and Color Flow Doppler were            utilized during procedure). Indications:    Atrial fibrillation  History:        Patient has no prior history of Echocardiogram examinations.  Sonographer:    Philomena Daring Referring Phys: TAUNA ELGIE BUTTER  Sonographer Comments: Image acquisition challenging due to respiratory  motion. IMPRESSIONS  1. Left ventricular ejection fraction, by estimation, is 60 to 65%. The left ventricle has normal function. Left ventricular diastolic parameters are indeterminate.  2. Right ventricular systolic function is normal. The right ventricular size is normal.  3. There is no evidence of pericardial effusion.  4. The mitral valve is normal in structure. Mild mitral valve regurgitation. No evidence of mitral stenosis.  5. The aortic valve is tricuspid. Aortic valve regurgitation is not visualized. No aortic stenosis is present.  6. The inferior vena cava is normal in size with greater than 50% respiratory variability, suggesting right atrial pressure of 3 mmHg. FINDINGS  Left Ventricle: Left ventricular ejection fraction, by estimation, is 60 to 65%. The left ventricle has normal function. Definity  contrast agent was given IV to delineate the left ventricular endocardial borders. The left ventricular internal cavity size was normal in size. Left ventricular diastolic parameters are indeterminate. Right Ventricle: The right ventricular size is normal. No increase in right ventricular wall thickness. Right ventricular systolic function is normal. Left Atrium: Left atrial size was normal in size. Right Atrium: Right atrial size was normal in size. Pericardium: There is no evidence of pericardial effusion. Mitral Valve: The mitral valve is normal in structure. Mild mitral valve regurgitation. No evidence of mitral valve stenosis. Tricuspid Valve: The tricuspid valve is normal in structure. Tricuspid valve regurgitation is mild . No evidence of tricuspid stenosis. Aortic Valve: The aortic valve is tricuspid. Aortic valve regurgitation is not visualized. No aortic stenosis is present. Aortic valve mean gradient measures 7.0 mmHg. Aortic valve peak gradient measures 13.6 mmHg. Aortic valve area, by VTI measures 2.80  cm. Pulmonic Valve: The pulmonic valve was normal in structure. Pulmonic valve regurgitation  is trivial. No evidence of pulmonic stenosis. Aorta: The aortic root and ascending aorta are structurally normal, with no evidence of dilitation. Venous: The inferior vena cava is normal in size with greater than 50% respiratory variability, suggesting right atrial pressure of 3 mmHg. IAS/Shunts: No atrial level shunt detected by color flow Doppler.  LEFT VENTRICLE PLAX 2D LVIDd:         3.80 cm   Diastology LVIDs:         2.60 cm   LV e' medial:    4.90 cm/s LV PW:         0.80 cm   LV E/e' medial:  14.5 LV IVS:        1.30 cm   LV e' lateral:   7.18  cm/s LVOT diam:     1.80 cm   LV E/e' lateral: 9.9 LV SV:         75 LV SV Index:   41 LVOT Area:     2.54 cm  RIGHT VENTRICLE             IVC RV S prime:     13.90 cm/s  IVC diam: 1.80 cm TAPSE (M-mode): 2.5 cm LEFT ATRIUM             Index        RIGHT ATRIUM           Index LA diam:        2.60 cm 1.43 cm/m   RA Area:     16.10 cm LA Vol (A2C):   46.1 ml 25.32 ml/m  RA Volume:   40.30 ml  22.13 ml/m LA Vol (A4C):   37.3 ml 20.48 ml/m LA Biplane Vol: 42.7 ml 23.45 ml/m  AORTIC VALVE AV Area (Vmax):    2.47 cm AV Area (Vmean):   2.46 cm AV Area (VTI):     2.80 cm AV Vmax:           184.50 cm/s AV Vmean:          123.000 cm/s AV VTI:            0.268 m AV Peak Grad:      13.6 mmHg AV Mean Grad:      7.0 mmHg LVOT Vmax:         179.00 cm/s LVOT Vmean:        119.000 cm/s LVOT VTI:          0.295 m LVOT/AV VTI ratio: 1.10  AORTA Ao Root diam: 3.30 cm Ao Asc diam:  3.30 cm MITRAL VALVE               TRICUSPID VALVE MV Area (PHT): 4.06 cm    TR Peak grad:   24.2 mmHg MV Decel Time: 187 msec    TR Vmax:        246.00 cm/s MV E velocity: 71.10 cm/s MV A velocity: 75.40 cm/s  SHUNTS MV E/A ratio:  0.94        Systemic VTI:  0.30 m                            Systemic Diam: 1.80 cm Kardie Tobb DO Electronically signed by Dub Huntsman DO Signature Date/Time: 03/08/2024/3:52:34 PM    Final    DG CHEST PORT 1 VIEW Result Date: 03/08/2024 CLINICAL DATA:  Tachypnea. EXAM:  PORTABLE CHEST 1 VIEW COMPARISON:  03/03/2024. FINDINGS: Trachea is midline. Heart is enlarged. Lungs are somewhat low in volume with perihilar interstitial prominence and indistinctness, new from 03/03/2024. There may be a small left pleural effusion. IMPRESSION: Congestive heart failure. Electronically Signed   By: Newell Eke M.D.   On: 03/08/2024 12:53   US  EKG SITE RITE Result Date: 03/08/2024 If Site Rite image not attached, placement could not be confirmed due to current cardiac rhythm.      Elgie Butter M.D. [facility] 03/09/2024, 1:03 PM  Available via Epic secure chat 7am-7pm After 7 pm, please refer to night coverage provider listed on amion.    "

## 2024-03-09 NOTE — Progress Notes (Signed)
 PHARMACY - ANTICOAGULATION CONSULT NOTE  Pharmacy Consult for Heparin   Indication: atrial fibrillation  Allergies[1]  Patient Measurements: Height: 5' 10 (177.8 cm) Weight: 65.8 kg (145 lb 1 oz) IBW/kg (Calculated) : 73 HEPARIN  DW (KG): 65.8  Vital Signs: Temp: 99 F (37.2 C) (02/05 0725) Temp Source: Oral (02/05 0725) BP: 130/52 (02/05 0930) Pulse Rate: 81 (02/05 0948)  Labs: Recent Labs    03/07/24 0350 03/08/24 0711 03/08/24 2103 03/09/24 0605  HGB 11.4* 11.9*  --  9.0*  HCT 36.3* 36.9*  --  28.9*  PLT 280 336  --  260  HEPARINUNFRC  --   --  0.47 0.36  CREATININE 0.79 0.88  --  0.65    Estimated Creatinine Clearance: 69.7 mL/min (by C-G formula based on SCr of 0.65 mg/dL).   Medical History: Past Medical History:  Diagnosis Date   Agent orange exposure    Anemia    BPH (benign prostatic hypertrophy)    Cancer (HCC)    basal cell temple   Complication of anesthesia    confusion and combative when waking up from last kidney procedure   Constipation    Depression    Fatigue    d/t Parkinson's   GERD (gastroesophageal reflux disease)    occasional   History of kidney stones    Left ureteral calculus    Lower urinary tract symptoms (LUTS)    Nephrolithiasis    Neuromuscular disorder (HCC)     parkinson's   Orthostatic hypotension    d/t Parkinson's disease   Orthostatic hypotension    Parkinson's disease (HCC)    Peyronie disease    Pneumonia 2018   Renal cyst, left    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 Patient found to be in afib with RVR on 2/4  AM. On lovenox  ppx - LD 2/4 at [phone]. Pharmacy consulted for heparin  management.  Today, 02/05: Heparin  level therapeutic @ 0.36 Hgb 9.0 --> trending down from baseline ~11, Plts consistently WNL  No s/sx of bleeding reported per RN  Goal of Therapy:  Heparin  level 0.3-0.7 units/ml Monitor platelets by anticoagulation protocol: Yes   Plan: Continue heparin  infusion at 950 units/hr Monitor CBC and heparin  daily  Monitor for signs and symptoms of bleeding   Thank you for allowing pharmacy to be a part of this patients care.  Latorya Bautch, Student-PharmD 03/09/2024, 11:31 AM         [1]  Allergies Allergen Reactions   Alfuzosin  Other (See Comments)    Hypotension    Rivastigmine Tartrate Nausea And Vomiting     Can tolerate the patch but can not tolerate the pills   Tamsulosin Other (See Comments)    Severe joint pain

## 2024-03-09 NOTE — Plan of Care (Signed)
  Problem: Clinical Measurements: Goal: Diagnostic test results will improve Outcome: Not Progressing   Problem: Activity: Goal: Risk for activity intolerance will decrease Outcome: Not Progressing   Problem: Nutrition: Goal: Adequate nutrition will be maintained Outcome: Not Progressing   

## 2024-03-10 LAB — GLUCOSE, CAPILLARY
Glucose-Capillary: 101 mg/dL — ABNORMAL HIGH (ref 70–99)
Glucose-Capillary: 115 mg/dL — ABNORMAL HIGH (ref 70–99)
Glucose-Capillary: 91 mg/dL (ref 70–99)
Glucose-Capillary: 93 mg/dL (ref 70–99)
Glucose-Capillary: 94 mg/dL (ref 70–99)
Glucose-Capillary: 99 mg/dL (ref 70–99)

## 2024-03-10 LAB — CBC WITH DIFFERENTIAL/PLATELET
Abs Immature Granulocytes: 0.65 10*3/uL — ABNORMAL HIGH (ref 0.00–0.07)
Basophils Absolute: 0.1 10*3/uL (ref 0.0–0.1)
Basophils Relative: 1 %
Eosinophils Absolute: 0.4 10*3/uL (ref 0.0–0.5)
Eosinophils Relative: 4 %
HCT: 32 % — ABNORMAL LOW (ref 39.0–52.0)
Hemoglobin: 10.2 g/dL — ABNORMAL LOW (ref 13.0–17.0)
Immature Granulocytes: 7 %
Lymphocytes Relative: 11 %
Lymphs Abs: 1 10*3/uL (ref 0.7–4.0)
MCH: 30.9 pg (ref 26.0–34.0)
MCHC: 31.9 g/dL (ref 30.0–36.0)
MCV: 97 fL (ref 80.0–100.0)
Monocytes Absolute: 0.8 10*3/uL (ref 0.1–1.0)
Monocytes Relative: 8 %
Neutro Abs: 6.6 10*3/uL (ref 1.7–7.7)
Neutrophils Relative %: 69 %
Platelets: 321 10*3/uL (ref 150–400)
RBC: 3.3 MIL/uL — ABNORMAL LOW (ref 4.22–5.81)
RDW: 12.8 % (ref 11.5–15.5)
Smear Review: NORMAL
WBC: 9.6 10*3/uL (ref 4.0–10.5)
nRBC: 0 % (ref 0.0–0.2)

## 2024-03-10 LAB — CULTURE, BLOOD (ROUTINE X 2)
Culture: NO GROWTH
Culture: NO GROWTH

## 2024-03-10 LAB — COMPREHENSIVE METABOLIC PANEL WITH GFR
ALT: <5 U/L (ref 0–44)
AST: 22 U/L (ref 15–41)
Albumin: 2.4 g/dL — ABNORMAL LOW (ref 3.5–5.0)
Alkaline Phosphatase: 59 U/L (ref 38–126)
Anion gap: 8 (ref 5–15)
BUN: 11 mg/dL (ref 8–23)
CO2: 23 mmol/L (ref 22–32)
Calcium: 7.9 mg/dL — ABNORMAL LOW (ref 8.9–10.3)
Chloride: 108 mmol/L (ref 98–111)
Creatinine, Ser: 0.78 mg/dL (ref 0.61–1.24)
GFR, Estimated: >60 mL/min
Glucose, Bld: 94 mg/dL (ref 70–99)
Potassium: 3.7 mmol/L (ref 3.5–5.1)
Sodium: 139 mmol/L (ref 135–145)
Total Bilirubin: 0.4 mg/dL (ref 0.0–1.2)
Total Protein: 4.6 g/dL — ABNORMAL LOW (ref 6.5–8.1)

## 2024-03-10 LAB — HEPARIN LEVEL (UNFRACTIONATED): Heparin Unfractionated: 0.36 [IU]/mL (ref 0.30–0.70)

## 2024-03-10 LAB — PHOSPHORUS: Phosphorus: 2.2 mg/dL — ABNORMAL LOW (ref 2.5–4.6)

## 2024-03-10 LAB — LACTIC ACID, PLASMA: Lactic Acid, Venous: 0.7 mmol/L (ref 0.5–1.9)

## 2024-03-10 LAB — MAGNESIUM: Magnesium: 2.5 mg/dL — ABNORMAL HIGH (ref 1.7–2.4)

## 2024-03-10 MED ORDER — ALPRAZOLAM 0.25 MG PO TABS: 0.2500 mg | ORAL_TABLET | Freq: Two times a day (BID) | ORAL | Status: AC | PRN

## 2024-03-10 MED ORDER — ORAL CARE MOUTH RINSE
15.0000 mL | OROMUCOSAL | Status: AC
  Administered 2024-03-10 (×4): 15 mL via OROMUCOSAL

## 2024-03-10 MED ORDER — ZINC OXIDE 40 % EX OINT
1.0000 | TOPICAL_OINTMENT | Freq: Two times a day (BID) | CUTANEOUS | Status: AC
  Administered 2024-03-10 (×2): 1 via TOPICAL
  Filled 2024-03-10: qty 57

## 2024-03-10 MED ORDER — ENOXAPARIN SODIUM 80 MG/0.8ML IJ SOSY
1.0000 mg/kg | PREFILLED_SYRINGE | Freq: Two times a day (BID) | INTRAMUSCULAR | Status: AC
  Administered 2024-03-10: 65 mg via SUBCUTANEOUS
  Filled 2024-03-10: qty 0.8

## 2024-03-10 MED ORDER — ORAL CARE MOUTH RINSE: 15.0000 mL | OROMUCOSAL | Status: AC | PRN

## 2024-03-10 NOTE — Progress Notes (Signed)
 "        [name] [name]                                                                               [name] [name], is a 79 y.o. male, DOB - 1945-08-16, FMW:[id number] Admit date - 03/03/2024    Outpatient Primary MD for the patient is [name] Parry T, MD  LOS - 6  days    Brief summary    79 y.o. male with PMH significant for primary CNS lymphoma status post WBRT, seizures, Parkinson's disease, orthostatic hypotension, BPH, depression, GERD.  He has recently seen  palliative care on 1/22 for increased confusion / hallucinations and impaired mobility.  His symptoms were attributed to urinary tract infection and he was prescribed a 10-day course of Keflex .  He was recently started on Seroquel  2 days ago for hallucinations and sleep disturbance.  [name] [name] has recently tapered off his steroids.  Patient presented in the ED further evaluation of altered mental status / confusion / agitation.   UA with negative nitrite, large leukocytes, 21-50 RBCs, >50 WBCs, and no bacteria. Urine culture blood cultures were obtained. Chest x-ray no acute cardiopulmonary abnormality. CT head without any acute intracranial finding. Wife reports patient has multiple falls at home.   Hospital course significant for atrial fibrillation with RVR which has resolved, but continuous altered  mental status.   Assessment & Plan    Assessment and Plan:  Atrial fibrillation with RVR and hypotension:  Converted to NSR , remains NSR.  In the setting of possible sepsis from UTI/ Sepsis/possible aspiration pneumonia Patient's heart rate was between 140 to 160/min ON 2/4 along with hypotension. EKG shows new onset atrial fibrillation with RVR Patient was started on amiodarone  bolus, and gtt transitioned to oral amiodarone . Recommend to continue with ringer lactate.  Cardiology consulted, Echocardiogram showed LVEF 60 to 65%, diastolic parameters are indeterminate.  Repeat blood cultures and started the patient on  broad-spectrum IV antibiotics. CHA2DS2-VASc Score = 3 , was started on IV heparin  transitioned to lovenox .  TSH wnl.    Pseudomonas urinary tract infection On IV cefepime .    Acinetobacter and staph saprophyticus in blood cultures from January 30 Initially thought to be a contaminant.  Repeat blood cultures and started the patient on broad-spectrum IV antibiotics. Fever trend improving, [name] max remains around 100.   CXR showing CHF.  Repeat UA is negative.     Acute metabolic encephalopathy in the setting of progressive neurological disorder, Parkinson's disease and CNS lymphoma s/p whole brain radiation and  seizures Patient currently minimally responsive, able to recognize his wife. He has poor oral intake currently n.p.o. cortrak tube placed for nutrition.  He is on carbidopa  levodopa  and the Lamictal .  In view of his continuous altered mental status, will get MRI brain with and without contrast.    Hypophosphatemia Replaced.     Poor oral intake/cachexia In view of his AMS, SLP recommended palliative / GOC discussions, recommended NPO and ST will sign off. S/p cortrak.  Nutrition on board     History of primary CNS lymphoma status post WBRT: Follows with [name] [name].  He was previously on steroids which have now  been tapered off due to concern for altered mental status / Hallucinations.   Requested oncology consult from Dr [name] as patient;s wife has many questions.  CT head  showing no acute findings.     Seizures Resume lamictal .   H/o orthostatic hypotension:  Chronic .      Estimated body mass index is 20.81 kg/m as calculated from the following:   Height as of this encounter: 5' 10 (1.778 m).   Weight as of this encounter: 65.8 kg.  Code Status: full code  DVT Prophylaxis:  Heparin .   Level of Care: Level of care: Progressive Family Communication: Updated patient's family at bedside.   Disposition Plan:     Remains inpatient appropriate:   pending.   Procedures:  CT head without contrast.   Consultants:   PCCM Cardiology Oncology  Palliative care.   Antimicrobials:   Anti-infectives (From admission, onward)    Start     Dose/Rate Route Frequency Ordered Stop   03/08/24 2200  vancomycin  (VANCOREADY) IVPB 750 mg/150 mL  Status:  Discontinued        750 mg 150 mL/hr over 60 Minutes Intravenous Every 12 hours 03/08/24 0912 03/10/24 1039   03/08/24 1000  vancomycin  (VANCOCIN ) IVPB 1000 mg/200 mL premix        1,000 mg 200 mL/hr over 60 Minutes Intravenous  Once 03/08/24 0912 03/08/24 1140   03/08/24 1000  ceFEPIme  (MAXIPIME ) 2 g in sodium chloride  0.9 % 100 mL IVPB        2 g 200 mL/hr over 30 Minutes Intravenous Every 8 hours 03/08/24 0912     03/06/24 2200  meropenem  (MERREM ) 1 g in sodium chloride  0.9 % 100 mL IVPB  Status:  Discontinued        1 g 200 mL/hr over 30 Minutes Intravenous Every 8 hours 03/06/24 2055 03/07/24 0908   03/05/24 1100  ceFEPIme  (MAXIPIME ) 2 g in sodium chloride  0.9 % 100 mL IVPB  Status:  Discontinued        2 g 200 mL/hr over 30 Minutes Intravenous Every 12 hours 03/05/24 1046 03/06/24 2055   03/05/24 0000  cefTRIAXone  (ROCEPHIN ) 1 g in sodium chloride  0.9 % 100 mL IVPB  Status:  Discontinued        1 g 200 mL/hr over 30 Minutes Intravenous Every 24 hours 03/04/24 0051 03/04/24 0059   03/05/24 0000  cefTRIAXone  (ROCEPHIN ) 2 g in sodium chloride  0.9 % 100 mL IVPB  Status:  Discontinued        2 g 200 mL/hr over 30 Minutes Intravenous Every 24 hours 03/04/24 0059 03/05/24 1046   03/04/24 0015  cefTRIAXone  (ROCEPHIN ) 2 g in sodium chloride  0.9 % 100 mL IVPB        2 g 200 mL/hr over 30 Minutes Intravenous  Once 03/04/24 0001 03/04/24 0052        Medications  Scheduled Meds:  amiodarone   400 mg Per NG tube BID   Followed by   [name] [name] ON 03/14/2024] amiodarone   200 mg Per NG tube Daily   carbidopa -levodopa   1 tablet Per Tube BID   carbidopa -levodopa   2 tablet Per Tube QID    Chlorhexidine  Gluconate Cloth  6 each Topical Daily   free water   200 mL Per Tube Q6H   lamoTRIgine   100 mg Per Tube BID   LORazepam   0.5-1 mg Intravenous Once   mouth rinse  15 mL Mouth Rinse 4 times per day   pantoprazole  (PROTONIX ) IV  40 mg  Intravenous Daily   thiamine   100 mg Per Tube Daily   Continuous Infusions:  ceFEPime  (MAXIPIME ) IV Stopped (03/10/24 1020)   feeding supplement (KATE FARMS STANDARD ENT 1.4) 25 mL/hr at 03/10/24 0222   heparin  950 Units/hr (03/10/24 0222)   PRN Meds:.acetaminophen  **OR** acetaminophen , ALPRAZolam , lip balm, mouth rinse, sodium chloride  flush    Subjective:   [name] [name] was seen and examined today.  No events overnight. Wife requesting putting him on anxiety meds to calm him. He remains confused. Not following any commands.   Objective:   Vitals:   03/10/24 0400 03/10/24 0500 03/10/24 0600 03/10/24 0743  BP: (!) 113/47 (!) 114/56 (!) 128/109   Pulse: 81 83 80   Resp: (!) 22 (!) 23 19   Temp:    98.3 F (36.8 C)  TempSrc:    Axillary  SpO2: 96% 95% 96%   Weight:      Height:        Intake/Output Summary (Last 24 hours) at 03/10/2024 1146 Last data filed at 03/10/2024 0600 Gross per 24 hour  Intake 1941.96 ml  Output 400 ml  Net 1541.96 ml   Filed Weights   03/04/24 0620 03/08/24 0909 03/08/24 0930  Weight: 65.4 kg 65.8 kg 65.8 kg     Exam General exam: ill appearing elderly gentleman, not in distress.  Respiratory system: Clear to auscultation. Respiratory effort normal. Cardiovascular system: S1 & S2 heard, RRR.  Gastrointestinal system: Abdomen is nondistended, soft and nontender.  Central nervous system: Alert but confused.  Extremities: no edema.  Skin: No rashes,  Psychiatry: unable to assess.      Data Reviewed:  I have personally reviewed following labs and imaging studies   CBC Lab Results  Component Value Date   WBC 9.6 03/10/2024   RBC 3.30 (L) 03/10/2024   HGB 10.2 (L) 03/10/2024   HCT 32.0 (L)  03/10/2024   MCV 97.0 03/10/2024   MCH 30.9 03/10/2024   PLT 321 03/10/2024   MCHC 31.9 03/10/2024   RDW 12.8 03/10/2024   LYMPHSABS 1.0 03/10/2024   MONOABS 0.8 03/10/2024   EOSABS 0.4 03/10/2024   BASOSABS 0.1 03/10/2024     Last metabolic panel Lab Results  Component Value Date   NA 139 03/10/2024   K 3.7 03/10/2024   CL 108 03/10/2024   CO2 23 03/10/2024   BUN 11 03/10/2024   CREATININE 0.78 03/10/2024   GLUCOSE 94 03/10/2024   GFRNONAA >60 03/10/2024   GFRAA >60 12/12/2014   CALCIUM  7.9 (L) 03/10/2024   PHOS 2.2 (L) 03/10/2024   PROT 4.6 (L) 03/10/2024   ALBUMIN 2.4 (L) 03/10/2024   BILITOT 0.4 03/10/2024   ALKPHOS 59 03/10/2024   AST 22 03/10/2024   ALT <5 03/10/2024   ANIONGAP 8 03/10/2024    CBG (last 3)  Recent Labs    03/10/24 0506 03/10/24 0735 03/10/24 1122  GLUCAP 91 101* 93      Coagulation Profile: No results for input(s): INR, PROTIME in the last 168 hours.   Radiology Studies: DG Abd 1 View Result Date: 03/08/2024 CLINICAL DATA:  Nasogastric tube placement. EXAM: ABDOMEN - 1 VIEW COMPARISON:  None Available. FINDINGS: Tip of the weighted enteric tube below the diaphragm in the stomach. The tubing is looped in the gastric body with the tip directed towards the cardia. Mild gaseous distension of small and large bowel in the included upper abdomen. IMPRESSION: Tip of the weighted enteric tube below the diaphragm in the stomach. The tubing  is looped in the gastric body with the tip directed towards the cardia. Electronically Signed   By: Andrea Gasman M.D.   On: 03/08/2024 18:00   ECHOCARDIOGRAM COMPLETE Result Date: 03/08/2024    ECHOCARDIOGRAM REPORT   Patient Name:   [name] [name] Date of Exam: 03/08/2024 Medical Rec #:  [id number]      Height:       70.0 in Accession #:    [id number]     Weight:       145.1 lb Date of Birth:  Mar 16, 1945      BSA:          1.821 m Patient Age:    78 years       BP:           118/49 mmHg Patient Gender: M               HR:           81 bpm. Exam Location:  Inpatient Procedure: 2D Echo, Cardiac Doppler, Color Doppler and Intracardiac            Opacification Agent (Both Spectral and Color Flow Doppler were            utilized during procedure). Indications:    Atrial fibrillation  History:        Patient has no prior history of Echocardiogram examinations.  Sonographer:    Philomena Daring Referring Phys: TAUNA ELGIE BUTTER  Sonographer Comments: Image acquisition challenging due to respiratory motion. IMPRESSIONS  1. Left ventricular ejection fraction, by estimation, is 60 to 65%. The left ventricle has normal function. Left ventricular diastolic parameters are indeterminate.  2. Right ventricular systolic function is normal. The right ventricular size is normal.  3. There is no evidence of pericardial effusion.  4. The mitral valve is normal in structure. Mild mitral valve regurgitation. No evidence of mitral stenosis.  5. The aortic valve is tricuspid. Aortic valve regurgitation is not visualized. No aortic stenosis is present.  6. The inferior vena cava is normal in size with greater than 50% respiratory variability, suggesting right atrial pressure of 3 mmHg. FINDINGS  Left Ventricle: Left ventricular ejection fraction, by estimation, is 60 to 65%. The left ventricle has normal function. Definity  contrast agent was given IV to delineate the left ventricular endocardial borders. The left ventricular internal cavity size was normal in size. Left ventricular diastolic parameters are indeterminate. Right Ventricle: The right ventricular size is normal. No increase in right ventricular wall thickness. Right ventricular systolic function is normal. Left Atrium: Left atrial size was normal in size. Right Atrium: Right atrial size was normal in size. Pericardium: There is no evidence of pericardial effusion. Mitral Valve: The mitral valve is normal in structure. Mild mitral valve regurgitation. No evidence of mitral valve stenosis.  Tricuspid Valve: The tricuspid valve is normal in structure. Tricuspid valve regurgitation is mild . No evidence of tricuspid stenosis. Aortic Valve: The aortic valve is tricuspid. Aortic valve regurgitation is not visualized. No aortic stenosis is present. Aortic valve mean gradient measures 7.0 mmHg. Aortic valve peak gradient measures 13.6 mmHg. Aortic valve area, by VTI measures 2.80  cm. Pulmonic Valve: The pulmonic valve was normal in structure. Pulmonic valve regurgitation is trivial. No evidence of pulmonic stenosis. Aorta: The aortic root and ascending aorta are structurally normal, with no evidence of dilitation. Venous: The inferior vena cava is normal in size with greater than 50% respiratory variability, suggesting right atrial pressure of 3 mmHg.  IAS/Shunts: No atrial level shunt detected by color flow Doppler.  LEFT VENTRICLE PLAX 2D LVIDd:         3.80 cm   Diastology LVIDs:         2.60 cm   LV e' medial:    4.90 cm/s LV PW:         0.80 cm   LV E/e' medial:  14.5 LV IVS:        1.30 cm   LV e' lateral:   7.18 cm/s LVOT diam:     1.80 cm   LV E/e' lateral: 9.9 LV SV:         75 LV SV Index:   41 LVOT Area:     2.54 cm  RIGHT VENTRICLE             IVC RV S prime:     13.90 cm/s  IVC diam: 1.80 cm TAPSE (M-mode): 2.5 cm LEFT ATRIUM             Index        RIGHT ATRIUM           Index LA diam:        2.60 cm 1.43 cm/m   RA Area:     16.10 cm LA Vol (A2C):   46.1 ml 25.32 ml/m  RA Volume:   40.30 ml  22.13 ml/m LA Vol (A4C):   37.3 ml 20.48 ml/m LA Biplane Vol: 42.7 ml 23.45 ml/m  AORTIC VALVE AV Area (Vmax):    2.47 cm AV Area (Vmean):   2.46 cm AV Area (VTI):     2.80 cm AV Vmax:           184.50 cm/s AV Vmean:          123.000 cm/s AV VTI:            0.268 m AV Peak Grad:      13.6 mmHg AV Mean Grad:      7.0 mmHg LVOT Vmax:         179.00 cm/s LVOT Vmean:        119.000 cm/s LVOT VTI:          0.295 m LVOT/AV VTI ratio: 1.10  AORTA Ao Root diam: 3.30 cm Ao Asc diam:  3.30 cm MITRAL VALVE                TRICUSPID VALVE MV Area (PHT): 4.06 cm    TR Peak grad:   24.2 mmHg MV Decel Time: 187 msec    TR Vmax:        246.00 cm/s MV E velocity: 71.10 cm/s MV A velocity: 75.40 cm/s  SHUNTS MV E/A ratio:  0.94        Systemic VTI:  0.30 m                            Systemic Diam: 1.80 cm Kardie Tobb DO Electronically signed by Dub Huntsman DO Signature Date/Time: 03/08/2024/3:52:34 PM    Final        Elgie Butter M.D. [name] [name] 03/10/2024, 11:46 AM  Available via Epic secure chat 7am-7pm After 7 pm, please refer to night coverage provider listed on amion.    "

## 2024-03-10 NOTE — Progress Notes (Signed)
 PHARMACY - ANTICOAGULATION CONSULT NOTE  Pharmacy Consult for Heparin   Indication: atrial fibrillation  Allergies[1]  Patient Measurements: Height: 5' 10 (177.8 cm) Weight: 65.8 kg (145 lb 1 oz) IBW/kg (Calculated) : 73 HEPARIN  DW (KG): 65.8  Vital Signs: Temp: 98.3 F (36.8 C) (02/06 0743) Temp Source: Axillary (02/06 0743) BP: 128/109 (02/06 0600) Pulse Rate: 80 (02/06 0600)  Labs: Recent Labs    03/08/24 0711 03/08/24 2103 03/09/24 0605 03/10/24 0508  HGB 11.9*  --  9.0* 10.2*  HCT 36.9*  --  28.9* 32.0*  PLT 336  --  260 321  HEPARINUNFRC  --  0.47 0.36 0.36  CREATININE 0.88  --  0.65 0.78    Estimated Creatinine Clearance: 69.7 mL/min (by C-G formula based on SCr of 0.78 mg/dL).   Medical History: Past Medical History:  Diagnosis Date   Agent orange exposure    Anemia    BPH (benign prostatic hypertrophy)    Cancer (HCC)    basal cell temple   Complication of anesthesia    confusion and combative when waking up from last kidney procedure   Constipation    Depression    Fatigue    d/t Parkinson's   GERD (gastroesophageal reflux disease)    occasional   History of kidney stones    Left ureteral calculus    Lower urinary tract symptoms (LUTS)    Nephrolithiasis    Neuromuscular disorder (HCC)     parkinson's   Orthostatic hypotension    d/t Parkinson's disease   Orthostatic hypotension    Parkinson's disease (HCC)    Peyronie disease    Pneumonia 2018   Renal cyst, left    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 Patient found to be in afib with RVR on  2/4 AM. On lovenox  ppx - LD 2/4 at [phone]. Pharmacy consulted for heparin  management.  Today, 02/06: Heparin  level therapeutic @ 0.36 Hgb 10.2 --> trending up from 9.0 (02/05), Plts consistently WNL  No s/sx of bleeding reported per RN  Goal of Therapy:  Heparin  level 0.3-0.7 units/ml Monitor platelets by anticoagulation protocol: Yes   Plan: Continue heparin  infusion at 950 units/hr Monitor CBC and heparin  daily  Monitor for signs and symptoms of bleeding  F/u ability to transition to PO anticoagulation  Thank you for allowing pharmacy to be a part of this patients care.  Raequan Vanschaick, Student-PharmD 03/10/2024, 9:42 AM          [1]  Allergies Allergen Reactions   Alfuzosin  Other (See Comments)    Hypotension    Rivastigmine Tartrate Nausea And Vomiting     Can tolerate the patch but can not tolerate the pills   Tamsulosin Other (See Comments)    Severe joint pain

## 2024-03-10 NOTE — Plan of Care (Signed)
  Problem: Clinical Measurements: Goal: Ability to maintain clinical measurements within normal limits will improve Outcome: Progressing Goal: Will remain free from infection Outcome: Progressing Goal: Diagnostic test results will improve Outcome: Progressing Goal: Respiratory complications will improve Outcome: Progressing Goal: Cardiovascular complication will be avoided Outcome: Progressing   Problem: Activity: Goal: Risk for activity intolerance will decrease Outcome: Progressing   Problem: Nutrition: Goal: Adequate nutrition will be maintained Outcome: Progressing   Problem: Coping: Goal: Level of anxiety will decrease Outcome: Progressing   Problem: Elimination: Goal: Will not experience complications related to bowel motility Outcome: Progressing Goal: Will not experience complications related to urinary retention Outcome: Progressing   Problem: Pain Managment: Goal: General experience of comfort will improve and/or be controlled Outcome: Progressing   Problem: Safety: Goal: Ability to remain free from injury will improve Outcome: Progressing   Problem: Skin Integrity: Goal: Risk for impaired skin integrity will decrease Outcome: Progressing   Problem: Safety: Goal: Non-violent Restraint(s) Outcome: Progressing

## 2024-04-06 ENCOUNTER — Ambulatory Visit (HOSPITAL_COMMUNITY): Admitting: Nurse Practitioner

## 2024-04-10 ENCOUNTER — Inpatient Hospital Stay: Attending: Neurosurgery | Admitting: Internal Medicine
